# Patient Record
Sex: Female | Born: 1935 | Race: Black or African American | Hispanic: No | Marital: Married | Smoking: Former smoker
Health system: Southern US, Community
[De-identification: ages and names within clinical notes are randomized; demographics above are authoritative.]

## PROBLEM LIST (undated history)

## (undated) DIAGNOSIS — G252 Other specified forms of tremor: Secondary | ICD-10-CM

## (undated) DIAGNOSIS — K5792 Diverticulitis of intestine, part unspecified, without perforation or abscess without bleeding: Secondary | ICD-10-CM

## (undated) DIAGNOSIS — I5032 Chronic diastolic (congestive) heart failure: Secondary | ICD-10-CM

## (undated) DIAGNOSIS — M1990 Unspecified osteoarthritis, unspecified site: Secondary | ICD-10-CM

## (undated) DIAGNOSIS — Z8701 Personal history of pneumonia (recurrent): Secondary | ICD-10-CM

## (undated) DIAGNOSIS — I639 Cerebral infarction, unspecified: Secondary | ICD-10-CM

## (undated) DIAGNOSIS — K219 Gastro-esophageal reflux disease without esophagitis: Secondary | ICD-10-CM

## (undated) DIAGNOSIS — E119 Type 2 diabetes mellitus without complications: Secondary | ICD-10-CM

## (undated) DIAGNOSIS — D638 Anemia in other chronic diseases classified elsewhere: Secondary | ICD-10-CM

## (undated) DIAGNOSIS — R569 Unspecified convulsions: Secondary | ICD-10-CM

## (undated) DIAGNOSIS — M79602 Pain in left arm: Secondary | ICD-10-CM

## (undated) DIAGNOSIS — I10 Essential (primary) hypertension: Secondary | ICD-10-CM

## (undated) DIAGNOSIS — F0390 Unspecified dementia without behavioral disturbance: Secondary | ICD-10-CM

## (undated) DIAGNOSIS — I2510 Atherosclerotic heart disease of native coronary artery without angina pectoris: Secondary | ICD-10-CM

## (undated) DIAGNOSIS — G250 Essential tremor: Secondary | ICD-10-CM

## (undated) HISTORY — PX: TUMOR REMOVAL: SHX12

## (undated) HISTORY — PX: FRACTURE SURGERY: SHX138

## (undated) HISTORY — DX: Atherosclerotic heart disease of native coronary artery without angina pectoris: I25.10

## (undated) HISTORY — PX: APPENDECTOMY: SHX54

## (undated) HISTORY — PX: COLONOSCOPY: SHX174

## (undated) HISTORY — PX: DILATION AND CURETTAGE OF UTERUS: SHX78

## (undated) HISTORY — PX: ABDOMINAL HYSTERECTOMY: SHX81

## (undated) HISTORY — DX: Unspecified dementia, unspecified severity, without behavioral disturbance, psychotic disturbance, mood disturbance, and anxiety: F03.90

## (undated) HISTORY — DX: Essential tremor: G25.0

## (undated) HISTORY — DX: Other specified forms of tremor: G25.2

---

## 1997-10-10 ENCOUNTER — Emergency Department (HOSPITAL_COMMUNITY): Admission: EM | Admit: 1997-10-10 | Discharge: 1997-10-10 | Payer: Self-pay | Admitting: Emergency Medicine

## 1998-09-19 ENCOUNTER — Encounter: Payer: Self-pay | Admitting: Emergency Medicine

## 1998-09-19 ENCOUNTER — Inpatient Hospital Stay (HOSPITAL_COMMUNITY): Admission: EM | Admit: 1998-09-19 | Discharge: 1998-09-24 | Payer: Self-pay | Admitting: Emergency Medicine

## 1998-10-04 ENCOUNTER — Encounter: Payer: Self-pay | Admitting: Emergency Medicine

## 1998-10-04 ENCOUNTER — Emergency Department (HOSPITAL_COMMUNITY): Admission: EM | Admit: 1998-10-04 | Discharge: 1998-10-04 | Payer: Self-pay | Admitting: Emergency Medicine

## 1998-10-05 ENCOUNTER — Encounter: Payer: Self-pay | Admitting: General Surgery

## 1998-10-05 ENCOUNTER — Inpatient Hospital Stay (HOSPITAL_COMMUNITY): Admission: EM | Admit: 1998-10-05 | Discharge: 1998-10-07 | Payer: Self-pay | Admitting: Emergency Medicine

## 1998-10-29 ENCOUNTER — Emergency Department (HOSPITAL_COMMUNITY): Admission: EM | Admit: 1998-10-29 | Discharge: 1998-10-29 | Payer: Self-pay | Admitting: Emergency Medicine

## 1998-11-18 ENCOUNTER — Emergency Department (HOSPITAL_COMMUNITY): Admission: EM | Admit: 1998-11-18 | Discharge: 1998-11-18 | Payer: Self-pay | Admitting: Emergency Medicine

## 1998-11-20 ENCOUNTER — Inpatient Hospital Stay (HOSPITAL_COMMUNITY): Admission: AD | Admit: 1998-11-20 | Discharge: 1998-11-23 | Payer: Self-pay | Admitting: Family Medicine

## 1998-12-01 ENCOUNTER — Emergency Department (HOSPITAL_COMMUNITY): Admission: EM | Admit: 1998-12-01 | Discharge: 1998-12-01 | Payer: Self-pay | Admitting: Emergency Medicine

## 1998-12-06 ENCOUNTER — Observation Stay (HOSPITAL_COMMUNITY): Admission: EM | Admit: 1998-12-06 | Discharge: 1998-12-07 | Payer: Self-pay | Admitting: Emergency Medicine

## 1999-07-12 ENCOUNTER — Emergency Department (HOSPITAL_COMMUNITY): Admission: EM | Admit: 1999-07-12 | Discharge: 1999-07-12 | Payer: Self-pay | Admitting: Emergency Medicine

## 1999-09-23 ENCOUNTER — Ambulatory Visit (HOSPITAL_COMMUNITY): Admission: RE | Admit: 1999-09-23 | Discharge: 1999-09-23 | Payer: Self-pay | Admitting: Family Medicine

## 1999-09-23 ENCOUNTER — Encounter: Payer: Self-pay | Admitting: Family Medicine

## 2001-01-25 ENCOUNTER — Encounter: Payer: Self-pay | Admitting: Emergency Medicine

## 2001-01-26 ENCOUNTER — Inpatient Hospital Stay (HOSPITAL_COMMUNITY): Admission: EM | Admit: 2001-01-26 | Discharge: 2001-02-02 | Payer: Self-pay | Admitting: Emergency Medicine

## 2001-01-27 ENCOUNTER — Encounter: Payer: Self-pay | Admitting: Family Medicine

## 2001-01-30 ENCOUNTER — Encounter: Payer: Self-pay | Admitting: Family Medicine

## 2001-02-22 ENCOUNTER — Ambulatory Visit (HOSPITAL_COMMUNITY): Admission: RE | Admit: 2001-02-22 | Discharge: 2001-02-22 | Payer: Self-pay | Admitting: Cardiology

## 2001-02-23 ENCOUNTER — Encounter: Payer: Self-pay | Admitting: Emergency Medicine

## 2001-02-23 ENCOUNTER — Emergency Department (HOSPITAL_COMMUNITY): Admission: EM | Admit: 2001-02-23 | Discharge: 2001-02-23 | Payer: Self-pay | Admitting: Emergency Medicine

## 2001-04-16 ENCOUNTER — Inpatient Hospital Stay (HOSPITAL_COMMUNITY): Admission: EM | Admit: 2001-04-16 | Discharge: 2001-04-20 | Payer: Self-pay

## 2001-04-16 ENCOUNTER — Encounter: Payer: Self-pay | Admitting: Family Medicine

## 2001-05-07 ENCOUNTER — Emergency Department (HOSPITAL_COMMUNITY): Admission: EM | Admit: 2001-05-07 | Discharge: 2001-05-08 | Payer: Self-pay | Admitting: Emergency Medicine

## 2001-05-08 ENCOUNTER — Encounter: Payer: Self-pay | Admitting: Emergency Medicine

## 2001-06-15 ENCOUNTER — Inpatient Hospital Stay (HOSPITAL_COMMUNITY): Admission: EM | Admit: 2001-06-15 | Discharge: 2001-06-22 | Payer: Self-pay

## 2001-08-13 ENCOUNTER — Emergency Department (HOSPITAL_COMMUNITY): Admission: EM | Admit: 2001-08-13 | Discharge: 2001-08-13 | Payer: Self-pay | Admitting: Emergency Medicine

## 2002-08-31 ENCOUNTER — Emergency Department (HOSPITAL_COMMUNITY): Admission: EM | Admit: 2002-08-31 | Discharge: 2002-08-31 | Payer: Self-pay | Admitting: Emergency Medicine

## 2002-08-31 ENCOUNTER — Encounter: Payer: Self-pay | Admitting: Emergency Medicine

## 2003-10-17 ENCOUNTER — Ambulatory Visit (HOSPITAL_COMMUNITY): Admission: RE | Admit: 2003-10-17 | Discharge: 2003-10-17 | Payer: Self-pay | Admitting: Family Medicine

## 2004-10-29 ENCOUNTER — Ambulatory Visit: Payer: Self-pay | Admitting: Physical Medicine & Rehabilitation

## 2004-10-29 ENCOUNTER — Inpatient Hospital Stay (HOSPITAL_COMMUNITY): Admission: EM | Admit: 2004-10-29 | Discharge: 2004-11-04 | Payer: Self-pay | Admitting: Emergency Medicine

## 2004-11-04 ENCOUNTER — Inpatient Hospital Stay
Admission: RE | Admit: 2004-11-04 | Discharge: 2004-11-12 | Payer: Self-pay | Admitting: Physical Medicine & Rehabilitation

## 2004-12-29 ENCOUNTER — Emergency Department (HOSPITAL_COMMUNITY): Admission: EM | Admit: 2004-12-29 | Discharge: 2004-12-29 | Payer: Self-pay | Admitting: Emergency Medicine

## 2005-05-24 ENCOUNTER — Emergency Department (HOSPITAL_COMMUNITY): Admission: EM | Admit: 2005-05-24 | Discharge: 2005-05-25 | Payer: Self-pay | Admitting: Emergency Medicine

## 2005-06-03 ENCOUNTER — Emergency Department (HOSPITAL_COMMUNITY): Admission: EM | Admit: 2005-06-03 | Discharge: 2005-06-03 | Payer: Self-pay | Admitting: Emergency Medicine

## 2005-09-24 ENCOUNTER — Encounter: Payer: Self-pay | Admitting: General Surgery

## 2005-12-11 ENCOUNTER — Emergency Department (HOSPITAL_COMMUNITY): Admission: EM | Admit: 2005-12-11 | Discharge: 2005-12-12 | Payer: Self-pay | Admitting: Emergency Medicine

## 2006-02-06 ENCOUNTER — Emergency Department (HOSPITAL_COMMUNITY): Admission: EM | Admit: 2006-02-06 | Discharge: 2006-02-06 | Payer: Self-pay | Admitting: Emergency Medicine

## 2006-02-08 ENCOUNTER — Emergency Department (HOSPITAL_COMMUNITY): Admission: EM | Admit: 2006-02-08 | Discharge: 2006-02-09 | Payer: Self-pay | Admitting: Emergency Medicine

## 2006-03-10 ENCOUNTER — Ambulatory Visit (HOSPITAL_COMMUNITY): Admission: RE | Admit: 2006-03-10 | Discharge: 2006-03-10 | Payer: Self-pay | Admitting: Family Medicine

## 2006-08-09 ENCOUNTER — Inpatient Hospital Stay (HOSPITAL_COMMUNITY): Admission: EM | Admit: 2006-08-09 | Discharge: 2006-08-14 | Payer: Self-pay | Admitting: Emergency Medicine

## 2006-12-28 ENCOUNTER — Inpatient Hospital Stay (HOSPITAL_COMMUNITY): Admission: EM | Admit: 2006-12-28 | Discharge: 2006-12-30 | Payer: Self-pay | Admitting: Emergency Medicine

## 2007-04-05 ENCOUNTER — Emergency Department (HOSPITAL_COMMUNITY): Admission: EM | Admit: 2007-04-05 | Discharge: 2007-04-05 | Payer: Self-pay | Admitting: Emergency Medicine

## 2007-08-01 ENCOUNTER — Emergency Department (HOSPITAL_COMMUNITY): Admission: EM | Admit: 2007-08-01 | Discharge: 2007-08-02 | Payer: Self-pay | Admitting: Emergency Medicine

## 2007-09-27 ENCOUNTER — Emergency Department (HOSPITAL_COMMUNITY): Admission: EM | Admit: 2007-09-27 | Discharge: 2007-09-27 | Payer: Self-pay | Admitting: Emergency Medicine

## 2009-04-24 DIAGNOSIS — M109 Gout, unspecified: Secondary | ICD-10-CM

## 2009-04-24 DIAGNOSIS — M1990 Unspecified osteoarthritis, unspecified site: Secondary | ICD-10-CM | POA: Insufficient documentation

## 2009-04-24 DIAGNOSIS — F068 Other specified mental disorders due to known physiological condition: Secondary | ICD-10-CM

## 2009-04-24 DIAGNOSIS — I10 Essential (primary) hypertension: Secondary | ICD-10-CM

## 2009-04-24 DIAGNOSIS — K219 Gastro-esophageal reflux disease without esophagitis: Secondary | ICD-10-CM | POA: Insufficient documentation

## 2009-04-24 DIAGNOSIS — G252 Other specified forms of tremor: Secondary | ICD-10-CM

## 2009-04-24 DIAGNOSIS — E119 Type 2 diabetes mellitus without complications: Secondary | ICD-10-CM | POA: Insufficient documentation

## 2009-04-24 DIAGNOSIS — G250 Essential tremor: Secondary | ICD-10-CM

## 2009-04-24 DIAGNOSIS — I509 Heart failure, unspecified: Secondary | ICD-10-CM | POA: Insufficient documentation

## 2009-04-25 ENCOUNTER — Ambulatory Visit: Payer: Self-pay | Admitting: Cardiology

## 2009-04-25 DIAGNOSIS — I739 Peripheral vascular disease, unspecified: Secondary | ICD-10-CM

## 2009-04-25 DIAGNOSIS — R079 Chest pain, unspecified: Secondary | ICD-10-CM

## 2009-04-25 DIAGNOSIS — I2510 Atherosclerotic heart disease of native coronary artery without angina pectoris: Secondary | ICD-10-CM | POA: Insufficient documentation

## 2009-04-25 DIAGNOSIS — R0602 Shortness of breath: Secondary | ICD-10-CM

## 2009-04-25 LAB — CONVERTED CEMR LAB
CO2: 24 meq/L (ref 19–32)
Chloride: 110 meq/L (ref 96–112)
Creatinine, Ser: 1.6 mg/dL — ABNORMAL HIGH (ref 0.4–1.2)
Glucose, Bld: 143 mg/dL — ABNORMAL HIGH (ref 70–99)
Pro B Natriuretic peptide (BNP): 192 pg/mL — ABNORMAL HIGH (ref 0.0–100.0)
Sodium: 141 meq/L (ref 135–145)

## 2009-05-03 ENCOUNTER — Telehealth (INDEPENDENT_AMBULATORY_CARE_PROVIDER_SITE_OTHER): Payer: Self-pay

## 2009-05-07 ENCOUNTER — Ambulatory Visit (HOSPITAL_COMMUNITY): Admission: RE | Admit: 2009-05-07 | Discharge: 2009-05-07 | Payer: Self-pay | Admitting: Cardiology

## 2009-05-07 ENCOUNTER — Ambulatory Visit: Payer: Self-pay

## 2009-05-07 ENCOUNTER — Ambulatory Visit: Payer: Self-pay | Admitting: Cardiology

## 2009-05-07 ENCOUNTER — Encounter (HOSPITAL_COMMUNITY): Admission: RE | Admit: 2009-05-07 | Discharge: 2009-05-23 | Payer: Self-pay | Admitting: Cardiology

## 2009-05-07 ENCOUNTER — Encounter: Payer: Self-pay | Admitting: Cardiology

## 2009-05-08 ENCOUNTER — Ambulatory Visit: Payer: Self-pay | Admitting: Cardiology

## 2009-05-11 LAB — CONVERTED CEMR LAB
Alkaline Phosphatase: 89 units/L (ref 39–117)
CO2: 27 meq/L (ref 19–32)
Chloride: 102 meq/L (ref 96–112)
Cholesterol: 243 mg/dL — ABNORMAL HIGH (ref 0–200)
Direct LDL: 114 mg/dL
GFR calc non Af Amer: 43.74 mL/min (ref >60)
Glucose, Bld: 133 mg/dL — ABNORMAL HIGH (ref 70–99)
Total Bilirubin: 1.2 mg/dL (ref 0.3–1.2)
Total Protein: 7 g/dL (ref 6.0–8.3)
Triglycerides: 183 mg/dL — ABNORMAL HIGH (ref 0.0–149.0)

## 2009-05-29 ENCOUNTER — Ambulatory Visit: Payer: Self-pay | Admitting: Cardiology

## 2009-05-30 ENCOUNTER — Telehealth: Payer: Self-pay | Admitting: Cardiology

## 2009-09-11 ENCOUNTER — Ambulatory Visit: Payer: Self-pay | Admitting: Cardiology

## 2009-09-17 ENCOUNTER — Ambulatory Visit (HOSPITAL_COMMUNITY): Admission: RE | Admit: 2009-09-17 | Discharge: 2009-09-17 | Payer: Self-pay | Admitting: Family Medicine

## 2009-09-28 ENCOUNTER — Telehealth: Payer: Self-pay | Admitting: Cardiology

## 2009-09-28 ENCOUNTER — Ambulatory Visit: Payer: Self-pay | Admitting: Cardiology

## 2009-10-02 LAB — CONVERTED CEMR LAB: BUN: 28 mg/dL — ABNORMAL HIGH (ref 6–23)

## 2009-11-14 ENCOUNTER — Emergency Department (HOSPITAL_COMMUNITY): Admission: EM | Admit: 2009-11-14 | Discharge: 2009-11-14 | Payer: Self-pay | Admitting: Emergency Medicine

## 2009-12-21 ENCOUNTER — Inpatient Hospital Stay (HOSPITAL_COMMUNITY): Admission: EM | Admit: 2009-12-21 | Discharge: 2009-12-24 | Payer: Self-pay | Admitting: Emergency Medicine

## 2010-01-07 ENCOUNTER — Ambulatory Visit: Payer: Self-pay | Admitting: Cardiology

## 2010-01-14 LAB — CONVERTED CEMR LAB
BUN: 25 mg/dL — ABNORMAL HIGH (ref 6–23)
CO2: 24 meq/L (ref 19–32)
Chloride: 108 meq/L (ref 96–112)
GFR calc non Af Amer: 52.42 mL/min (ref >60)
Glucose, Bld: 102 mg/dL — ABNORMAL HIGH (ref 70–99)
Potassium: 3.9 meq/L (ref 3.5–5.1)

## 2010-03-31 ENCOUNTER — Inpatient Hospital Stay (HOSPITAL_COMMUNITY)
Admission: EM | Admit: 2010-03-31 | Discharge: 2010-04-06 | Payer: Self-pay | Source: Home / Self Care | Admitting: Emergency Medicine

## 2010-04-02 ENCOUNTER — Encounter: Payer: Self-pay | Admitting: Cardiology

## 2010-05-08 ENCOUNTER — Encounter: Payer: Self-pay | Admitting: Cardiology

## 2010-05-08 ENCOUNTER — Ambulatory Visit: Payer: Self-pay | Admitting: Cardiology

## 2010-05-22 ENCOUNTER — Ambulatory Visit: Payer: Self-pay | Admitting: Cardiology

## 2010-06-13 ENCOUNTER — Telehealth (INDEPENDENT_AMBULATORY_CARE_PROVIDER_SITE_OTHER): Payer: Self-pay | Admitting: *Deleted

## 2010-06-25 NOTE — Assessment & Plan Note (Signed)
Summary: per check out/sf   Visit Type:  Follow-up Primary Provider:  Billee Cashing, MD  CC:  no complaints.  History of Present Illness: 77 yo with history of mild CAD and diastolic CHF prevents for followup.  Patient has quite significant exertional shortness of breath that has been stable for a number of years.  She is short of breath after walking about 1/2 a city block slowly.  She is unable to climb steps due to shortness of breath.  She has to stop multiple times when she walks in [place]. She has had significant lower extremity edema for a number of years. She has orthopnea and requires 2 pillows (smothers with 1 pillow).  Symptoms have all been stable.  She was evaluated for a bladder prolapse surgery at [facility].  She was told that her heart was "too weak" for the surgery per her report (though EF by echo and myoview was normal and CHF is diastolic).    She had a Lexiscan myoview showing normal EF and some anterior attenuation with no evidence for ischemia.  Echo showed mild LVH and EF 55-60%.  Arterial dopplers done given leg pain and difficulty feeling pedal pulses showed no evidence for significant PAD.  She has gained about 5 lbs since I last saw her.  BP is quite high today, 180/77 and has been high at home.   Labs (12/10): BNP 192, K 4.4, creatinine 1.5, TG 183, HDL 93, LDL 114, AST 48, ALT 16    Current Medications (verified): 1)  Aspirin 81 Mg Tabs (Aspirin) .... Take One Tablet Once Daily 2)  Actos 15 Mg Tabs (Pioglitazone Hcl) .... Take One Tablet Once Daily 3)  Prevacid 30 Mg Cpdr (Lansoprazole) .... Take One Capsule 4)  Premarin 0.625 Mg/gm Crea (Estrogens, Conjugated) .... Use Twice in The Evening 5)  Colchicine 0.6 Mg Tabs (Colchicine) .... Take One Tablet Two Times A Day 6)  Metformin Hcl 500 Mg Tabs (Metformin Hcl) .Marland Kitchen.. 1 -2 Tabs Once Daily 7)  Clonidine Hcl 0.1 Mg Tabs (Clonidine Hcl) .... At Bedtime 8)  Lyrica 50 Mg Caps (Pregabalin) .... Take One Tablet  Three Times A Day 9)  Aricept 10 Mg Tabs (Donepezil Hcl) .... Take One Tablet At Bedtime 10)  Topiramate 50 Mg Tabs (Topiramate) .... Take One Tablet Once Daily 11)  Furosemide 40 Mg Tabs (Furosemide) .... One Tablet in The Morning and One-Half Tablet in The Evening 12)  Potassium Chloride Crys Cr 20 Meq Cr-Tabs (Potassium Chloride Crys Cr) .... Take One Tablet By Mouth Daily 13)  Simvastatin 20 Mg Tabs (Simvastatin) .... One Tablet Daily 14)  Coreg 6.25 Mg Tabs (Carvedilol) .... One Tablet Twice A Day  Allergies (verified): 1)  ! Morphine 2)  ! Codeine 3)  ! Pcn  Past History:  Past Medical History: Reviewed history from 05/29/2009 and no changes required. 1. Diabetes mellitus type II 2. Very mild dementia 3. gout 4. GERD 5. HTN 6. OA 7. Diastolic CHF: Echo (12/10) with mild LVH, EF 55-60%, no significant valvular abnormality.  8. History of diverticulitis 9. tremor 10. CAD: LHC (9/02) for abnormal myoview showed 60-70% D1 stenosis and luminal irregularities in other vessels.  Lexiscan myoview (12/10): EF 71%, mild anterior attenuation, no ischemia.  11. CKD 12. Arterial dopplers (12/10) without evidence for significant PAD.   Family History: Reviewed history from 04/25/2009 and no changes required. Father with MI at age 49.  4 brothers had MIs in their 80s.   Social History: Reviewed  history from 04/25/2009 and no changes required. Quit smoking 25 yrs ago.  Originally from [place] but lives in [place] now.  Has 5 children.  Married.   Review of Systems       All systems reviewed and negative except as per HPI.   Vital Signs:  Patient profile:   77 year old female Height:      61 inches Weight:      226 pounds BMI:     42.86 Pulse rate:   57 / minute BP sitting:   180 / 77  (left arm) Cuff size:   large  Vitals Entered By: Burnett Kanaris, CNA (September 11, 2009 4:18 PM)  Physical Exam  General:  Well developed, well nourished, in no acute distress. Obese.   Neck:  Neck supple, JVP 10 cm. No masses, thyromegaly or abnormal cervical nodes. Lungs:  Clear bilaterally to auscultation and percussion. Heart:  Non-displaced PMI, chest non-tender; regular rate and rhythm, S1, S2 without murmurs, rubs or gallops. Carotid upstroke normal, no bruit.  Pedals normal pulses. 2+ edema to knees bilaterally.  Abdomen:  Bowel sounds positive; abdomen soft and non-tender without masses, organomegaly, or hernias noted. No hepatosplenomegaly.  Obese.  Extremities:  No clubbing or cyanosis. Neurologic:  Alert and oriented x 3. Psych:  Normal affect.   Impression & Recommendations:  Problem # 1:  CONGESTIVE HEART FAILURE UNSPECIFIED (ICD-428.0) Diastolic CHF.  She is still volume overloaded on exam with NYHA class III symptoms, though improved. I am going to increase her Lasix to 40 mg bid.  She needs to watch salt intake.  BP needs to be controlled. I am also going to have her wear graded compression stockings, thigh high, given her chronic lower extremity edema. BMET and BNP in 2 wks.   Problem # 2:  HYPERTENSION (ICD-401.9) Needs better BP control, especially given diastolic CHF.  I will have her start amlodipine 5 mg daily.  BP check in 2 wks.    Patient Instructions: 1)  Your physician has recommended you make the following change in your medication:  2)  Increase Lasix(furosemide) to 40mg  twice a day 3)  Start Amlodipine 5mg  daily 4)  Your physician has requested that you regularly monitor and record your blood pressure readings at home.  Please use the same machine at the same time of day to check your readings and record them.  I will call you in 2 weeks to get the readings. Anne Lankford,RN  5)  Use support stockings--you have the prescription. 6)  Your physician recommends that you return for lab work in: 2 weeks--BMP/BNP 428.0 401.9  7)  Your physician recommends that you schedule a follow-up appointment in: 4 months with Dr  Shirlee Latch. Prescriptions: AMLODIPINE BESYLATE 5 MG TABS (AMLODIPINE BESYLATE) one tablet daily  #30 x 6   Entered by:   Katina Dung, RN, BSN   Authorized by:   Marca Ancona, MD   Signed by:   Katina Dung, RN, BSN on 09/11/2009   Method used:   Electronically to        [facility] (retail)       [address]       [place], [place]  [address]       Ph: [phone]       Fax: [phone]   RxID:   [id number] FUROSEMIDE 40 MG TABS (FUROSEMIDE) one tablet twice a day  #60 x 6   Entered  by:   Katina Dung, RN, BSN   Authorized by:   Marca Ancona, MD   Signed by:   Katina Dung, RN, BSN on 09/11/2009   Method used:   Electronically to        [facility] (retail)       [address]       [place], [place]  [address]       Ph: [phone]       Fax: [phone]   RxID:   [id number]

## 2010-06-25 NOTE — Progress Notes (Signed)
Summary: Dr.Lentz is MD at [facility]  Phone Note Call from Patient Call back at [facility] Phone [phone]   Caller: Patient Reason for Call: Insurance Question Summary of Call: Pt giving information for [facility]  MD  is Dr. Noland Fordyce [phone] Initial call taken by: Judie Grieve,  May 30, 2009 2:22 PM  Follow-up for Phone Call        [facility] Katina Dung, RN, BSN  May 30, 2009 3:04 PM  talked with pt--office note from 05-29-09 faxed to Dr Noland Fordyce

## 2010-06-25 NOTE — Miscellaneous (Signed)
Summary: Orders Update  Clinical Lists Changes  Orders: Added new Test order of T-Basic Metabolic Panel ([id number]) - Signed Added new Test order of T-BNP  (B Natriuretic Peptide) ([id number]) - Signed 

## 2010-06-25 NOTE — Assessment & Plan Note (Signed)
Summary: per check out/HAPPY BIRTHDAY/SAF   Visit Type:  Follow-up Primary Provider:  Billee Cashing, MD  CC:  no cardaic complaints.  History of Present Illness: 77 yo with history of mild CAD and diastolic CHF prevents for followup.  Patient has quite significant exertional shortness of breath that has been stable for a number of years.  She is short of breath after walking about 1/2 a city block slowly.  She is unable to climb steps due to shortness of breath.  She has had significant lower extremity edema for a number of years. She has orthopnea and requires 2 pillows (smothers with 1 pillow).  Symptoms have all been stable.    She had a Lexiscan myoview showing normal EF and some anterior attenuation with no evidence for ischemia.  Echo showed mild LVH and EF 55-60%.  Arterial dopplers done given leg pain and difficulty feeling pedal pulses showed no evidence for significant PAD.    Since I last saw her, she stopped taking amlodipine 10 mg daily because she thought it was causing diarrhea.  She does think two times a day Lasix has helped her dyspnea and swelling.  She was hospitalized at [facility] in 7/11 for idiopathic acute pancreatitis (not related to gallstones or ETOH).   Finally, she is still thinking about bladder prolapse surgery with Dr. Alwyn Ren.  Weight is down 7 lbs since last appointment  Labs (12/10): BNP 192, K 4.4, creatinine 1.5, TG 183, HDL 93, LDL 114, AST 48, ALT 16 Labs (5/11): K 4.2, creatinine 1.53, BNP 155 Labs (7/11): creatinine 1.2   Current Medications (verified): 1)  Aspirin 81 Mg Tabs (Aspirin) .... Take One Tablet Once Daily 2)  Actos 15 Mg Tabs (Pioglitazone Hcl) .... Take One Tablet Once Daily 3)  Prevacid 30 Mg Cpdr (Lansoprazole) .... Take One Capsule 4)  Premarin 0.625 Mg/gm Crea (Estrogens, Conjugated) .... Use Twice in The Evening 5)  Colchicine 0.6 Mg Tabs (Colchicine) .... Take One Tablet Two Times A Day 6)  Metformin Hcl 500 Mg Tabs (Metformin Hcl)  .Marland Kitchen.. 1 -2 Tabs Once Daily 7)  Clonidine Hcl 0.1 Mg Tabs (Clonidine Hcl) .... At Bedtime 8)  Lyrica 50 Mg Caps (Pregabalin) .... Take One Tablet Three Times A Day 9)  Aricept 10 Mg Tabs (Donepezil Hcl) .... Take One Tablet At Bedtime 10)  Topiramate 50 Mg Tabs (Topiramate) .... Take One Tablet Once Daily 11)  Furosemide 40 Mg Tabs (Furosemide) .... One Tablet Twice A Day 12)  Potassium Chloride Crys Cr 20 Meq Cr-Tabs (Potassium Chloride Crys Cr) .... Take One Tablet By Mouth Daily 13)  Simvastatin 20 Mg Tabs (Simvastatin) .... One Tablet Daily 14)  Coreg 6.25 Mg Tabs (Carvedilol) .... One Tablet Twice A Day 15)  Amlodipine Besylate 5 Mg Tabs (Amlodipine Besylate) .... Pt Does Not Take Causr Diaherra  Allergies (verified): 1)  ! Morphine 2)  ! Codeine 3)  ! Pcn  Past History:  Past Medical History: 1. Diabetes mellitus type II 2. Very mild dementia 3. gout 4. GERD 5. HTN 6. OA 7. Diastolic CHF: Echo (12/10) with mild LVH, EF 55-60%, no significant valvular abnormality.  8. History of diverticulitis 9. tremor 10. CAD: LHC (9/02) for abnormal myoview showed 60-70% D1 stenosis and luminal irregularities in other vessels.  Lexiscan myoview (12/10): EF 71%, mild anterior attenuation, no ischemia.  11. CKD 12. Arterial dopplers (12/10) without evidence for significant PAD.  13. Idiopathic acute pancreatitis in 7/11.  No ETOH or gallstones.   Family  History: Reviewed history from 04/25/2009 and no changes required. Father with MI at age 31.  4 brothers had MIs in their 54s.   Social History: Reviewed history from 04/25/2009 and no changes required. Quit smoking 25 yrs ago.  Originally from [place] but lives in [place] now.  Has 5 children.  Married.   Review of Systems       All systems reviewed and negative except as per HPI.   Vital Signs:  Patient profile:   77 year old female Height:      61 inches Weight:      219 pounds BMI:     41.53 Pulse rate:   67 /  minute BP sitting:   125 / 75  (left arm) Cuff size:   large  Vitals Entered By: Burnett Kanaris, CNA (January 07, 2010 3:59 PM)  Physical Exam  General:  Well developed, well nourished, in no acute distress. Obese.  Neck:  Neck supple, JVP not elevated. No masses, thyromegaly or abnormal cervical nodes. Lungs:  Clear bilaterally to auscultation and percussion. Heart:  Non-displaced PMI, chest non-tender; regular rate and rhythm, S1, S2 without murmurs, rubs or gallops. Carotid upstroke normal, no bruit.  Pedals normal pulses. 1+ edema to knees bilaterally.  Abdomen:  Bowel sounds positive; abdomen soft and non-tender without masses, organomegaly, or hernias noted. No hepatosplenomegaly. Extremities:  No clubbing or cyanosis. Neurologic:  Alert and oriented x 3. Psych:  Normal affect.   Impression & Recommendations:  Problem # 1:  CAD (ICD-414.00) Known CAD with 60-70% D1 stenosis on 2002 cath.  Nonischemic myoview in 12/10.   I will have her continue ASA 81 mg daily.  Started on low dose statin for goal LDL < 70. She is on a beta blocker and a statin.   Problem # 2:  CONGESTIVE HEART FAILURE UNSPECIFIED (ICD-428.0) Diastolic CHF.  Mild volume overload (peripheral edema but neck veins do not seem to be up much).   She needs to watch salt intake.  BP needs to stay controlled. She cannot wear compression stockings.   BMET/BNP today.   Problem # 3:  HYPERTENSION (ICD-401.9) I will have patient restart a lower dose of amlodipine (BP was very high in hospital), 5 mg daily.  We will monitor to see if this causes GI side effects.    Problem # 4:  PREOPERATIVE EVALUATION Patient needs bladder prolapse surgery.  Patient will be higher risk for surgery, given CHF, but she is well-compensated and the risk is not prohibitive.  No chest pain and recent negative myoview.  She should continue beta blocker peri-operatively.   Other Orders: TLB-BMP (Basic Metabolic Panel-BMET) (80048-[id number]) TLB-BNP  (B-Natriuretic Peptide) (83880-BNPR)  Patient Instructions: 1)  Your physician has recommended you make the following change in your medication:  2)  Take Amlodipine 5mg  daily. 3)  Your physician recommends that you have lab today--BMP/BNP. 4)  Your physician recommends that you schedule a follow-up appointment in: 4 months with Dr Shirlee Latch. Prescriptions: AMLODIPINE BESYLATE 5 MG TABS (AMLODIPINE BESYLATE) one daily  #30 x 11   Entered by:   Katina Dung, RN, BSN   Authorized by:   Marca Ancona, MD   Signed by:   Katina Dung, RN, BSN on 01/07/2010   Method used:   Electronically to        [facility] (retail)       [address]       [place]  [place], [place]  [address]       Ph: [phone]       Fax: [phone]   RxID:   [id number]

## 2010-06-25 NOTE — Consult Note (Signed)
Summary: [facility] Consultation Report  [place] Consultation Report   Imported By: Earl Many 04/16/2010 15:12:51  _____________________________________________________________________  External Attachment:    Type:   Image     Comment:   External Document

## 2010-06-25 NOTE — Assessment & Plan Note (Signed)
Summary: per check out/pt wanted to wait til after x-mas/saf   Primary Provider:  Billee Cashing, MD   History of Present Illness: 77 yo with history of mild CAD and diastolic CHF prevents for evaluation prior to bladder prolapse surgery.  She does not have the name of the surgeon.  Patient has quite significant exertional shortness of breath that has been present for a number of years.  She is short of breath after walking about 1/2 a city block slowly.  She is unable to climb steps due to shortness of breath.  She has to stop multiple times when she walks in [place]. She has had significant lower extremity edema for a number of years. She has orthopnea and requires 2 pillows (smothers with 1 pillow).  No PND.  Patient reports left-sided chest pressure, usually at night.  This is quite infrequent.  Finally, she gets pain in her legs, L>R right walking.  The pain tends to be in the thighs.    Since last appointment, she had a Lexiscan myoview showing normal EF and some anterior attenuation with no evidence for ischemia.  Echo showed mild LVH and EF 55-60%.  Arterial dopplers done given leg pain and difficulty feeling pedal pulses showed no evidence for significant PAD.  She has been taking Lasix but has only lost a couple of pounds.  However, she did have some dietary indiscretion about Christmas.  Breathing is a bit better but she is still significantly short of breath.    Labs (12/10): BNP 192, K 4.4, creatinine 1.5, TG 183, HDL 93, LDL 114, AST 48, ALT 16    Current Medications (verified): 1)  Aspirin 81 Mg Tabs (Aspirin) .... Take One Tablet Once Daily 2)  Actos 15 Mg Tabs (Pioglitazone Hcl) .... Take One Tablet Once Daily 3)  Prevacid 30 Mg Cpdr (Lansoprazole) .... Take One Capsule 4)  Premarin 0.625 Mg/gm Crea (Estrogens, Conjugated) .... Use Twice in The Evening 5)  Colchicine 0.6 Mg Tabs (Colchicine) .... Take One Tablet Two Times A Day 6)  Metformin Hcl 500 Mg Tabs (Metformin Hcl)  .Marland Kitchen.. 1 -2 Tabs Once Daily 7)  Clonidine Hcl 0.1 Mg Tabs (Clonidine Hcl) .... At Bedtime 8)  Lyrica 50 Mg Caps (Pregabalin) .... Take One Tablet Three Times A Day 9)  Aricept 10 Mg Tabs (Donepezil Hcl) .... Take One Tablet At Bedtime 10)  Topiramate 50 Mg Tabs (Topiramate) .... Take One Tablet Once Daily 11)  Furosemide 40 Mg Tabs (Furosemide) .... One Tablet in The Morning and One-Half Tablet in The Evening 12)  Potassium Chloride Crys Cr 20 Meq Cr-Tabs (Potassium Chloride Crys Cr) .... Take One Tablet By Mouth Daily 13)  Simvastatin 20 Mg Tabs (Simvastatin) .... One Tablet Daily 14)  Coreg 6.25 Mg Tabs (Carvedilol) .... One Tablet Twice A Day  Allergies: 1)  ! Morphine 2)  ! Codeine 3)  ! Pcn  Past History:  Past Medical History: 1. Diabetes mellitus type II 2. Very mild dementia 3. gout 4. GERD 5. HTN 6. OA 7. Diastolic CHF: Echo (12/10) with mild LVH, EF 55-60%, no significant valvular abnormality.  8. History of diverticulitis 9. tremor 10. CAD: LHC (9/02) for abnormal myoview showed 60-70% D1 stenosis and luminal irregularities in other vessels.  Lexiscan myoview (12/10): EF 71%, mild anterior attenuation, no ischemia.  11. CKD 12. Arterial dopplers (12/10) without evidence for significant PAD.   Family History: Reviewed history from 04/25/2009 and no changes required. Father with MI at age 52.  4 brothers had MIs in their 74s.   Social History: Reviewed history from 04/25/2009 and no changes required. Quit smoking 25 yrs ago.  Originally from [place] but lives in [place] now.  Has 5 children.  Married.   Review of Systems       All systems reviewed and negative except as per HPI.   Vital Signs:  Patient profile:   77 year old female Height:      61 inches Weight:      221 pounds BMI:     41.91 Pulse rate:   66 / minute BP sitting:   150 / 68  (left arm) Cuff size:   large  Vitals Entered By: Oswald Hillock (May 29, 2009 4:30 PM)  Physical  Exam  General:  Well developed, well nourished, in no acute distress. Obese.  Neck:  Neck supple, JVP 8-9 cm. No masses, thyromegaly or abnormal cervical nodes. Lungs:  Clear bilaterally to auscultation and percussion. Heart:  Non-displaced PMI, chest non-tender; regular rate and rhythm, S1, S2 without murmurs, rubs or gallops. Carotid upstroke normal, no bruit.  Pedals normal pulses. 2+ edema to knees bilaterally.  Abdomen:  Bowel sounds positive; abdomen soft and non-tender without masses, organomegaly, or hernias noted. No hepatosplenomegaly.  Obese.  Extremities:  No clubbing or cyanosis. Neurologic:  Alert and oriented x 3. Psych:  Normal affect.   Impression & Recommendations:  Problem # 1:  CAD (ICD-414.00) Known CAD with 60-70% D1 stenosis on 2002 cath.  Nonischemic myoview in 12/10.   I will have her continue ASA 81 mg daily.  Started on low dose statin for goal LDL < 70.  I will put her on a beta blocker prior to surgery (Coreg 6.25 mg two times a day).   Problem # 2:  CONGESTIVE HEART FAILURE UNSPECIFIED (ICD-428.0) Diastolic CHF.  She is still quite volume overloaded with NYHA class III symptoms, though improved.  I will have her increase Lasix to 40 mg qam and 20 mg qpm.  She needs to watch salt intake.  BP needs to be controlled.   Problem # 3:  HYPERTENSION (ICD-401.9) BP is elevated today.  I am adding Coreg as mentioned above. BP check in 2 wks.  Gently add ACEI if still elevated.   Problem # 4:  PREOPERATIVE EVALUATION Patient needs a bladder prolapse operation.  Patient had a nonischemic myoview.  She is being started on a beta blocker, which should be continued.  She is still volume overloaded (which has been chronic).  I am increasing her Lasix.  Will need to be careful with IV fluid peri-operatively.   I do not think she needs any further cardiac evaluation prior to surgery.   Patient Instructions: 1)  Your physician has recommended you make the following change in  your medication: 2)  Increase Lasix(furosemide) to 40mg  in the morning and 20mg  in the evening 3)  Start Coreg(carvedilol) 6.25mg  twice a day 4)  Try Coricidin HB for your nasal congestion--you can buy this without a prescription 5)  Take and record your blood pressure and pulse--I will call you in 2 weeks to get the results Luana Shu [phone]  6)  Your physician recommends that you schedule a follow-up appointment in: 2 months with Dr. Marca Ancona  Prescriptions: COREG 6.25 MG TABS (CARVEDILOL) one tablet twice a day  #60 x 3   Entered by:   Katina Dung, RN, BSN   Authorized by:   Marca Ancona, MD   Signed  by:   Katina Dung, RN, BSN on 05/29/2009   Method used:   Electronically to        [facility] (retail)       [address]       [place], [place]  [address]       Ph: [phone]       Fax: [phone]   RxID:   [id number] FUROSEMIDE 40 MG TABS (FUROSEMIDE) one tablet in the morning and one-half tablet in the evening  #50 x 3   Entered by:   Katina Dung, RN, BSN   Authorized by:   Marca Ancona, MD   Signed by:   Katina Dung, RN, BSN on 05/29/2009   Method used:   Electronically to        [facility] (retail)       [address]       [place], [place]  [address]       Ph: [phone]       Fax: [phone]   RxID:   [id number]

## 2010-06-25 NOTE — Progress Notes (Signed)
Summary: B/P readings-BMP/BNP 09-28-09--  Phone Note Outgoing Call   Call placed by: Katina Dung, RN, BSN,  Sep 28, 2009 8:08 AM Call placed to: Patient Summary of Call: B/P follow-up  Follow-up for Phone Call        check on B/P Lasix increased /Amlodipine addded 09-11-09---BMP/BNP scheduled for 09-28-09--call to get B/P readings Luana Shu      Appended Document: B/P readings-BMP/BNP 09-28-09-- talked with pt--recent B/P readings have been 130-140/40 --granddaughter taking B/P with automatic wrist machine--pt without complaints--reviewed with Dr Era Skeen new recommendations

## 2010-06-27 NOTE — Progress Notes (Signed)
  All Cardiac Faxed to [facility] @ [phone] [facility]  June 13, 2010 8:48 AM

## 2010-06-27 NOTE — Assessment & Plan Note (Signed)
Summary: per check out/sf   Visit Type:  Follow-up Primary Provider:  Billee Cashing, MD   History of Present Illness: 77 yo with history of mild CAD and diastolic CHF prevents for followup.  Patient has quite significant exertional shortness of breath that has been stable for a number of years.  She is short of breath after walking about 1/2 a city block slowly.  She is unable to climb steps due to shortness of breath.  She has had significant lower extremity edema for a number of years. She has orthopnea and requires 2 pillows (smothers with 1 pillow).  Symptoms have all been stable.  Weight is down 2 lbs since last appointment. BP is elevated to 156/72 today. She is keeping 3 of her great-grandchildren, which is a source of stress.   She had a Lexiscan myoview showing normal EF and some anterior attenuation with no evidence for ischemia.  Echo showed mild LVH and EF 55-60%.  Arterial dopplers done given leg pain and difficulty feeling pedal pulses showed no evidence for significant PAD.    She was hospitalized in 11/11 with abdominal pain.  At that time, she was diagnosed with gastroparesis and biliary dyskinesia.  At that time, it was decided to manage the biliary issues conservatively without cholecystectomy.   Labs (12/10): BNP 192, K 4.4, creatinine 1.5, TG 183, HDL 93, LDL 114, AST 48, ALT 16 Labs (5/11): K 4.2, creatinine 1.53, BNP 155 Labs (7/11): creatinine 1.2 Labs (8/11): BNP 150, K 3.9, creatinine 1.3  ECG: NSR, 1st degree AV block   Current Medications (verified): 1)  Aspirin 81 Mg Tabs (Aspirin) .... Take One Tablet Once Daily 2)  Actos 15 Mg Tabs (Pioglitazone Hcl) .... Take One Tablet Once Daily 3)  Prevacid 30 Mg Cpdr (Lansoprazole) .... Take One Capsule 4)  Premarin 0.625 Mg/gm Crea (Estrogens, Conjugated) .... Use Twice in The Evening 5)  Colchicine 0.6 Mg Tabs (Colchicine) .... As Needed 6)  Metformin Hcl 500 Mg Tabs (Metformin Hcl) .Marland Kitchen.. 1 -2 Tabs Once Daily 7)   Clonidine Hcl 0.1 Mg Tabs (Clonidine Hcl) .... At Bedtime 8)  Lyrica 50 Mg Caps (Pregabalin) .... Take One Tablet Three Times A Day 9)  Aricept 10 Mg Tabs (Donepezil Hcl) .... Take One Tablet At Bedtime 10)  Topiramate 50 Mg Tabs (Topiramate) .... Take One Tablet Once Daily 11)  Furosemide 40 Mg Tabs (Furosemide) .... One Tablet Twice A Day 12)  Potassium Chloride Crys Cr 20 Meq Cr-Tabs (Potassium Chloride Crys Cr) .... Take One Tablet By Mouth Twice A Day 13)  Simvastatin 20 Mg Tabs (Simvastatin) .... One Tablet Daily 14)  Coreg 6.25 Mg Tabs (Carvedilol) .... One Tablet Twice A Day 15)  Amlodipine Besylate 5 Mg Tabs (Amlodipine Besylate) .... One Daily  Allergies (verified): 1)  ! Morphine 2)  ! Codeine 3)  ! Pcn  Past History:  Past Medical History: 1. Diabetes mellitus type II 2. Very mild dementia 3. gout 4. GERD 5. HTN 6. OA 7. Diastolic CHF: Echo (12/10) with mild LVH, EF 55-60%, no significant valvular abnormality.  8. History of diverticulitis 9. tremor 10. CAD: [facility] (9/02) for abnormal myoview showed 60-70% D1 stenosis and luminal irregularities in other vessels.  Lexiscan myoview (12/10): EF 71%, mild anterior attenuation, no ischemia.  11. CKD 12. Arterial dopplers (12/10) without evidence for significant PAD.  13. Idiopathic acute pancreatitis in 7/11.  No ETOH or gallstones.  14. Gastroparesis 15. Biliary dyskinesia  Family History: Reviewed history from 04/25/2009  and no changes required. Father with MI at age 42.  4 brothers had MIs in their 60s.   Social History: Reviewed history from 04/25/2009 and no changes required. Quit smoking 25 yrs ago.  Originally from [place] but lives in [place] now.  Has 5 children.  Married.   Review of Systems       All systems reviewed and negative except as per HPI.   Vital Signs:  Patient profile:   77 year old female Height:      61 inches Weight:      217 pounds BMI:     41.15 Pulse rate:   64 / minute BP  sitting:   156 / 72  (right arm) Cuff size:   large  Vitals Entered By: Hardin Negus, RMA (May 08, 2010 3:44 PM)  Physical Exam  General:  Well developed, well nourished, in no acute distress. Obese.  Neck:  Neck supple, JVP not elevated. No masses, thyromegaly or abnormal cervical nodes. Lungs:  Clear bilaterally to auscultation and percussion. Heart:  Non-displaced PMI, chest non-tender; regular rate and rhythm, S1, S2 without murmurs, rubs or gallops. Carotid upstroke normal, no bruit.  Pedals normal pulses. 1+ edema to knees bilaterally.  Abdomen:  Bowel sounds positive; abdomen soft and non-tender without masses, organomegaly, or hernias noted. No hepatosplenomegaly. Extremities:  No clubbing or cyanosis. Neurologic:  Alert and oriented x 3. Psych:  Normal affect.   Impression & Recommendations:  Problem # 1:  CAD (ICD-414.00) Known CAD with 60-70% D1 stenosis on 2002 cath.  Nonischemic myoview in 12/10.   I will have her continue ASA 81 mg daily.  On low dose statin for goal LDL < 70.  I will have her get fasting lipids/LFTs.  She is on a beta blocker.   Problem # 2:  HYPERTENSION (ICD-401.9) BP still running high.  She will increase amlodipine to 10 mg daily.  She will give [place] a call if her lower extremity edema gets worse with this.   Problem # 3:  CONGESTIVE HEART FAILURE UNSPECIFIED (ICD-428.0) Diastolic CHF.  No JVD but has chronic peripheral edema.  Will continue current two times a day Lasix.    Followup in 6 months.   Other Orders: EKG w/ Interpretation (93000)  Patient Instructions: 1)  Your physician has recommended you make the following change in your medication:  2)  Increase Amlodipine to 10mg  daily--you can take two 5mg  tablets daily at the same time. 3)  Your physician recommends that you return for a FASTING lipid profile/liver profile 401.9  428.32 4)  Your physician wants you to follow-up in: 6 months with Dr Shirlee Latch.Joycie Peek 2012)  You will receive a  reminder letter in the mail two months in advance. If you don't receive a letter, please call our office to schedule the follow-up appointment. Prescriptions: AMLODIPINE BESYLATE 10 MG TABS (AMLODIPINE BESYLATE) one daily  #30 x 11   Entered by:   Katina Dung, RN, BSN   Authorized by:   Marca Ancona, MD   Signed by:   Katina Dung, RN, BSN on 05/08/2010   Method used:   Electronically to        [facility] (retail)       [address]       [place], [place]  [address]       Ph: [phone]       Fax: [phone]   RxID:   [id number]

## 2010-07-18 ENCOUNTER — Emergency Department (HOSPITAL_COMMUNITY): Payer: Medicare Other

## 2010-07-18 ENCOUNTER — Emergency Department (HOSPITAL_COMMUNITY)
Admission: EM | Admit: 2010-07-18 | Discharge: 2010-07-18 | Disposition: A | Discharge status: Home / Self Care | Payer: Medicare Other | Attending: Emergency Medicine | Admitting: Emergency Medicine

## 2010-07-18 DIAGNOSIS — R109 Unspecified abdominal pain: Secondary | ICD-10-CM | POA: Insufficient documentation

## 2010-07-18 DIAGNOSIS — I10 Essential (primary) hypertension: Secondary | ICD-10-CM | POA: Insufficient documentation

## 2010-07-18 DIAGNOSIS — M79609 Pain in unspecified limb: Secondary | ICD-10-CM | POA: Insufficient documentation

## 2010-07-18 DIAGNOSIS — K219 Gastro-esophageal reflux disease without esophagitis: Secondary | ICD-10-CM | POA: Insufficient documentation

## 2010-07-18 DIAGNOSIS — I509 Heart failure, unspecified: Secondary | ICD-10-CM | POA: Insufficient documentation

## 2010-07-18 DIAGNOSIS — Z79899 Other long term (current) drug therapy: Secondary | ICD-10-CM | POA: Insufficient documentation

## 2010-07-18 LAB — URINALYSIS, ROUTINE W REFLEX MICROSCOPIC
Ketones, ur: NEGATIVE mg/dL
Nitrite: NEGATIVE
Protein, ur: NEGATIVE mg/dL
Urine Glucose, Fasting: NEGATIVE mg/dL
Urobilinogen, UA: 0.2 mg/dL (ref 0.0–1.0)

## 2010-08-07 LAB — GLUCOSE, CAPILLARY
Glucose-Capillary: 114 mg/dL — ABNORMAL HIGH (ref 70–99)
Glucose-Capillary: 125 mg/dL — ABNORMAL HIGH (ref 70–99)
Glucose-Capillary: 125 mg/dL — ABNORMAL HIGH (ref 70–99)
Glucose-Capillary: 131 mg/dL — ABNORMAL HIGH (ref 70–99)
Glucose-Capillary: 132 mg/dL — ABNORMAL HIGH (ref 70–99)
Glucose-Capillary: 134 mg/dL — ABNORMAL HIGH (ref 70–99)
Glucose-Capillary: 140 mg/dL — ABNORMAL HIGH (ref 70–99)
Glucose-Capillary: 147 mg/dL — ABNORMAL HIGH (ref 70–99)
Glucose-Capillary: 153 mg/dL — ABNORMAL HIGH (ref 70–99)
Glucose-Capillary: 164 mg/dL — ABNORMAL HIGH (ref 70–99)
Glucose-Capillary: 181 mg/dL — ABNORMAL HIGH (ref 70–99)
Glucose-Capillary: 233 mg/dL — ABNORMAL HIGH (ref 70–99)

## 2010-08-07 LAB — CBC
HCT: 39 % (ref 36.0–46.0)
Hemoglobin: 11.6 g/dL — ABNORMAL LOW (ref 12.0–15.0)
Hemoglobin: 12.8 g/dL (ref 12.0–15.0)
MCH: 32.5 pg (ref 26.0–34.0)
MCH: 32.5 pg (ref 26.0–34.0)
MCHC: 32.8 g/dL (ref 30.0–36.0)
MCHC: 32.9 g/dL (ref 30.0–36.0)
MCV: 99 fL (ref 78.0–100.0)
RBC: 3.94 MIL/uL (ref 3.87–5.11)
RDW: 15.1 % (ref 11.5–15.5)

## 2010-08-07 LAB — URINALYSIS, ROUTINE W REFLEX MICROSCOPIC
Glucose, UA: NEGATIVE mg/dL
Ketones, ur: NEGATIVE mg/dL
Urobilinogen, UA: 1 mg/dL (ref 0.0–1.0)
pH: 5 (ref 5.0–8.0)

## 2010-08-07 LAB — POCT CARDIAC MARKERS
CKMB, poc: <1 ng/mL — ABNORMAL LOW (ref 1.0–8.0)
Myoglobin, poc: 46.4 ng/mL (ref 12–200)
Myoglobin, poc: 50.8 ng/mL (ref 12–200)

## 2010-08-07 LAB — URINE CULTURE
Colony Count: >=100000
Culture  Setup Time: 201111070856

## 2010-08-07 LAB — TROPONIN I: Troponin I: 0.03 ng/mL (ref 0.00–0.06)

## 2010-08-07 LAB — LIPID PANEL
Cholesterol: 218 mg/dL — ABNORMAL HIGH (ref 0–200)
LDL Cholesterol: 74 mg/dL (ref 0–99)

## 2010-08-07 LAB — COMPREHENSIVE METABOLIC PANEL
AST: 31 U/L (ref 0–37)
Albumin: 2.9 g/dL — ABNORMAL LOW (ref 3.5–5.2)
Alkaline Phosphatase: 104 U/L (ref 39–117)
BUN: 15 mg/dL (ref 6–23)
BUN: 8 mg/dL (ref 6–23)
CO2: 24 mEq/L (ref 19–32)
Chloride: 106 mEq/L (ref 96–112)
Creatinine, Ser: 1.22 mg/dL — ABNORMAL HIGH (ref 0.4–1.2)
GFR calc non Af Amer: 43 mL/min — ABNORMAL LOW (ref >60)
Glucose, Bld: 139 mg/dL — ABNORMAL HIGH (ref 70–99)
Potassium: 4.3 mEq/L (ref 3.5–5.1)
Total Bilirubin: 0.4 mg/dL (ref 0.3–1.2)
Total Protein: 6.8 g/dL (ref 6.0–8.3)

## 2010-08-07 LAB — BASIC METABOLIC PANEL
BUN: 6 mg/dL (ref 6–23)
CO2: 23 mEq/L (ref 19–32)
Chloride: 108 mEq/L (ref 96–112)
Creatinine, Ser: 1.24 mg/dL — ABNORMAL HIGH (ref 0.4–1.2)

## 2010-08-07 LAB — CARDIAC PANEL(CRET KIN+CKTOT+MB+TROPI)
Relative Index: 1.8 (ref 0.0–2.5)
Total CK: 100 U/L (ref 7–177)
Troponin I: 0.02 ng/mL (ref 0.00–0.06)
Troponin I: 0.03 ng/mL (ref 0.00–0.06)

## 2010-08-07 LAB — DIFFERENTIAL
Basophils Absolute: 0 10*3/uL (ref 0.0–0.1)
Basophils Relative: 1 % (ref 0–1)
Eosinophils Absolute: 0.4 10*3/uL (ref 0.0–0.7)
Monocytes Relative: 7 % (ref 3–12)
Neutrophils Relative %: 52 % (ref 43–77)

## 2010-08-07 LAB — POCT I-STAT, CHEM 8
BUN: 34 mg/dL — ABNORMAL HIGH (ref 6–23)
Creatinine, Ser: 2.2 mg/dL — ABNORMAL HIGH (ref 0.4–1.2)
Potassium: 3.8 mEq/L (ref 3.5–5.1)
Sodium: 140 mEq/L (ref 135–145)
TCO2: 27 mmol/L (ref 0–100)

## 2010-08-07 LAB — HEMOGLOBIN A1C: Mean Plasma Glucose: 154 mg/dL — ABNORMAL HIGH (ref <117)

## 2010-08-07 LAB — URINE MICROSCOPIC-ADD ON

## 2010-08-07 LAB — AMYLASE: Amylase: 34 U/L (ref 0–105)

## 2010-08-07 LAB — PROTIME-INR: INR: 1.01 (ref 0.00–1.49)

## 2010-08-09 LAB — GLUCOSE, CAPILLARY
Glucose-Capillary: 137 mg/dL — ABNORMAL HIGH (ref 70–99)
Glucose-Capillary: 158 mg/dL — ABNORMAL HIGH (ref 70–99)

## 2010-08-10 LAB — GLUCOSE, CAPILLARY
Glucose-Capillary: 101 mg/dL — ABNORMAL HIGH (ref 70–99)
Glucose-Capillary: 111 mg/dL — ABNORMAL HIGH (ref 70–99)
Glucose-Capillary: 113 mg/dL — ABNORMAL HIGH (ref 70–99)
Glucose-Capillary: 113 mg/dL — ABNORMAL HIGH (ref 70–99)
Glucose-Capillary: 114 mg/dL — ABNORMAL HIGH (ref 70–99)
Glucose-Capillary: 117 mg/dL — ABNORMAL HIGH (ref 70–99)
Glucose-Capillary: 122 mg/dL — ABNORMAL HIGH (ref 70–99)
Glucose-Capillary: 123 mg/dL — ABNORMAL HIGH (ref 70–99)
Glucose-Capillary: 134 mg/dL — ABNORMAL HIGH (ref 70–99)
Glucose-Capillary: 142 mg/dL — ABNORMAL HIGH (ref 70–99)
Glucose-Capillary: 147 mg/dL — ABNORMAL HIGH (ref 70–99)
Glucose-Capillary: 147 mg/dL — ABNORMAL HIGH (ref 70–99)
Glucose-Capillary: 147 mg/dL — ABNORMAL HIGH (ref 70–99)
Glucose-Capillary: 159 mg/dL — ABNORMAL HIGH (ref 70–99)
Glucose-Capillary: 159 mg/dL — ABNORMAL HIGH (ref 70–99)
Glucose-Capillary: 182 mg/dL — ABNORMAL HIGH (ref 70–99)
Glucose-Capillary: 189 mg/dL — ABNORMAL HIGH (ref 70–99)

## 2010-08-10 LAB — BASIC METABOLIC PANEL
Calcium: 8.7 mg/dL (ref 8.4–10.5)
Chloride: 110 mEq/L (ref 96–112)
Creatinine, Ser: 1.2 mg/dL (ref 0.4–1.2)
GFR calc Af Amer: 53 mL/min — ABNORMAL LOW (ref >60)
GFR calc non Af Amer: 44 mL/min — ABNORMAL LOW (ref >60)

## 2010-08-10 LAB — COMPREHENSIVE METABOLIC PANEL
ALT: 21 U/L (ref 0–35)
ALT: 23 U/L (ref 0–35)
AST: 34 U/L (ref 0–37)
Albumin: 2.9 g/dL — ABNORMAL LOW (ref 3.5–5.2)
CO2: 20 mEq/L (ref 19–32)
CO2: 21 mEq/L (ref 19–32)
Calcium: 8.8 mg/dL (ref 8.4–10.5)
Chloride: 104 mEq/L (ref 96–112)
Chloride: 109 mEq/L (ref 96–112)
Creatinine, Ser: 1.25 mg/dL — ABNORMAL HIGH (ref 0.4–1.2)
Creatinine, Ser: 1.33 mg/dL — ABNORMAL HIGH (ref 0.4–1.2)
GFR calc Af Amer: 51 mL/min — ABNORMAL LOW (ref >60)
GFR calc non Af Amer: 39 mL/min — ABNORMAL LOW (ref >60)
GFR calc non Af Amer: 42 mL/min — ABNORMAL LOW (ref >60)
Glucose, Bld: 149 mg/dL — ABNORMAL HIGH (ref 70–99)
Sodium: 135 mEq/L (ref 135–145)
Sodium: 139 mEq/L (ref 135–145)
Total Bilirubin: 0.3 mg/dL (ref 0.3–1.2)
Total Bilirubin: 0.8 mg/dL (ref 0.3–1.2)

## 2010-08-10 LAB — CARDIAC PANEL(CRET KIN+CKTOT+MB+TROPI)
CK, MB: 1.9 ng/mL (ref 0.3–4.0)
Relative Index: INVALID (ref 0.0–2.5)
Total CK: 101 U/L (ref 7–177)
Total CK: 99 U/L (ref 7–177)
Troponin I: 0.01 ng/mL (ref 0.00–0.06)
Troponin I: 0.02 ng/mL (ref 0.00–0.06)

## 2010-08-10 LAB — DIFFERENTIAL
Basophils Absolute: 0 10*3/uL (ref 0.0–0.1)
Eosinophils Absolute: 0.3 10*3/uL (ref 0.0–0.7)
Lymphs Abs: 1.4 10*3/uL (ref 0.7–4.0)
Neutrophils Relative %: 46 % (ref 43–77)

## 2010-08-10 LAB — CBC
Platelets: 124 10*3/uL — ABNORMAL LOW (ref 150–400)
RBC: 3.61 MIL/uL — ABNORMAL LOW (ref 3.87–5.11)
WBC: 4 10*3/uL (ref 4.0–10.5)

## 2010-08-10 LAB — CK TOTAL AND CKMB (NOT AT ARMC)
CK, MB: 2.2 ng/mL (ref 0.3–4.0)
Relative Index: 1.8 (ref 0.0–2.5)
Total CK: 121 U/L (ref 7–177)

## 2010-08-10 LAB — POCT CARDIAC MARKERS
CKMB, poc: 1.6 ng/mL (ref 1.0–8.0)
Myoglobin, poc: 142 ng/mL (ref 12–200)
Troponin i, poc: <0.05 ng/mL (ref 0.00–0.09)

## 2010-08-10 LAB — LIPID PANEL
HDL: 80 mg/dL (ref >39)
Total CHOL/HDL Ratio: 2.7 RATIO
Triglycerides: 273 mg/dL — ABNORMAL HIGH (ref <150)
VLDL: 55 mg/dL — ABNORMAL HIGH (ref 0–40)

## 2010-08-10 LAB — LIPASE, BLOOD: Lipase: 115 U/L — ABNORMAL HIGH (ref 11–59)

## 2010-08-10 LAB — VITAMIN B12: Vitamin B-12: 432 pg/mL (ref 211–911)

## 2010-08-10 LAB — TROPONIN I: Troponin I: 0.01 ng/mL (ref 0.00–0.06)

## 2010-08-11 LAB — POCT I-STAT, CHEM 8
BUN: 23 mg/dL (ref 6–23)
Calcium, Ion: 1.01 mmol/L — ABNORMAL LOW (ref 1.12–1.32)
Chloride: 107 mEq/L (ref 96–112)
Glucose, Bld: 164 mg/dL — ABNORMAL HIGH (ref 70–99)
Potassium: 4 mEq/L (ref 3.5–5.1)

## 2010-08-11 LAB — DIFFERENTIAL
Lymphocytes Relative: 32 % (ref 12–46)
Monocytes Absolute: 0.5 10*3/uL (ref 0.1–1.0)
Monocytes Relative: 10 % (ref 3–12)
Neutro Abs: 2.8 10*3/uL (ref 1.7–7.7)
Neutrophils Relative %: 55 % (ref 43–77)

## 2010-08-11 LAB — CBC
HCT: 40.6 % (ref 36.0–46.0)
Hemoglobin: 13.4 g/dL (ref 12.0–15.0)
RBC: 3.99 MIL/uL (ref 3.87–5.11)
WBC: 5 10*3/uL (ref 4.0–10.5)

## 2010-08-11 LAB — BRAIN NATRIURETIC PEPTIDE: Pro B Natriuretic peptide (BNP): 64.3 pg/mL (ref 0.0–100.0)

## 2010-10-08 NOTE — H&P (Signed)
NAME:  SAFIYAH, CISNEY NO.:  [id number]   MEDICAL RECORD NO.:  [id number]          PATIENT TYPE:  INP   LOCATION:  5040                         FACILITY:  [facility]   PHYSICIAN:  Della Goo, M.D. DATE OF BIRTH:  [birth year]   DATE OF ADMISSION:  12/27/2006  DATE OF DISCHARGE:                              HISTORY & PHYSICAL   PRIMARY CARE PHYSICIAN:  Dr. Billee Cashing.   CHIEF COMPLAINT:  Nausea, vomiting, diarrhea.   HISTORY OF PRESENT ILLNESS:  This is a 77 year old female presenting to  the emergency department secondary to complaints of abdominal pain along  with nausea, vomiting, and diarrhea for 2 days.  She has a history of  diverticulosis, along with type 2 diabetes mellitus.  She denies having  any fevers or chills.  She denies having any melena or hematochezia or  chest pain, shortness of breath.  She reports that no one at home has  similar symptoms.Marland Kitchen   PAST MEDICAL HISTORY:  1. History of type 2 diabetes mellitus.  2. Hypertension.  3. Gastroesophageal reflux disease.  4. Benign essential tremors.  5. Gout.  6. Osteoarthritis.  7. Congestive heart failure syndrome.  8. Mild dementia.   MEDICATIONS:  1. Clonidine 0.2 mg 1 p.o. b.i.d.  2. Glipizide 5 mg 1 p.o. q.a.m.  3. Furosemide 40 mg 1 p.o. b.i.d.  4. Lyrica 50 mg 1 p.o. t.i.d.  5. Premarin 1.25 mg 1 p.o. daily.  6. Metformin 500 mg 1 p.o. b.i.d.  7. Aricept 10 mg 1 p.o. at bedtime.  8. Topamax 50 mg 1 p.o. at bedtime.  9. Zyrtec 10 mg 1 p.o. daily.  10.Prevacid 30 mg 1 p.o. daily.   ALLERGIES:  1. CODEINE.  2. MORPHINE.  3. PENICILLIN.   SOCIAL HISTORY:  The patient lives at home with her husband, and is a  nonsmoker, nondrinker.   FAMILY HISTORY:  Noncontributory.   PHYSICAL EXAMINATION FINDINGS:  GENERAL:  This is an obese 77 year old  female in discomfort but no acute distress.  VITAL SIGNS:  Temperature 98.1, blood pressure 168/95, heart rate 81,  respirations 18, O2  saturations 93%.  HEENT: Normocephalic, atraumatic.  Pupils equally round and reactive to  light.  Extraocular muscles are intact.  There is no scleral icterus.  Funduscopic benign.  Oropharynx is clear.  She is edentulous.  NECK:  Supple, full range of motion.  No thyromegaly, adenopathy, or  jugular venous distension.  CARDIOVASCULAR:  Regular rate and rhythm.  No murmurs, gallops, or rubs.  LUNGS:  Clear to auscultation bilaterally.  ABDOMEN:  Positive bowel sounds, mildly decreased, soft, nontender,  nondistended.  No hepatosplenomegaly, no rebound, no guarding.  EXTREMITIES:  Without cyanosis, clubbing, or edema.  NEUROLOGIC:  Mild generalized weakness.  There are no focal deficits.   LABORATORY STUDIES:  White blood cell count 6.4, hemoglobin 13.9,  hematocrit 41.5, MCV 101.1, platelets 182, neutrophils 57%, lymphocytes  31%.  Albumin 3.6, AST 26, ALT 25, total bilirubin 0.6, alkaline  phosphatase 83, lipase 27.  Urinalysis:  Small leukocyte esterase, and  positive urine nitrites, many epithelials, and many bacteria on  microscopic.  Acute abdominal series reveals no acute cardiopulmonary  process.  The abdominal films do reveal a focal ileus.  No evidence of  bowel obstruction, and no free air.   ASSESSMENT:  A 77 year old female being admitted with:  1. Abdominal pain.  2. Focal ileus.  3. Dehydration.  4. Type 2 diabetes mellitus.  5. Urinary tract infection.  6. Hypertension.   PLAN:  The patient will be placed on a clear liquid diet and placed on  IV fluids for rehydration and maintenance therapy.  IV ciprofloxacin and  IV Flagyl have been ordered.  She will continue on her regular  medications, except metformin has been placed on hold for now.  Sliding  scale coverage has also been ordered.  DVT and GI prophylaxis also have  been ordered.      Della Goo, M.D.  Electronically Signed     HJ/MEDQ  D:  12/28/2006  T:  12/29/2006  Job:  [id number]

## 2010-10-08 NOTE — Discharge Summary (Signed)
NAME:  Glenda Gonzalez, Glenda Gonzalez NO.:  [id number]   MEDICAL RECORD NO.:  [id number]          PATIENT TYPE:  INP   LOCATION:  5040                         FACILITY:  [facility]   PHYSICIAN:  [name], M.D.   DATE OF BIRTH:  11-16-1933   DATE OF ADMISSION:  12/28/2006  DATE OF DISCHARGE:  12/30/2006                               DISCHARGE SUMMARY   PRIMARY CARE PHYSICIAN:  Dr. Ronne Gonzalez.   DISCHARGE DIAGNOSES:  1. Escherichia coli urinary tract infection.  2. Hypertension.  3. Diabetes.  4. Gastroesophageal reflux disease.  5. Benign essential tremor.  6. Gout.  7. Osteoarthritis.  8. History of congestive heart failure, stable.  9. Dementia.  10.Renal insufficiency acute on chronic.  11.Focal ileus.  12.Diverticulosis.  13.Moderate to large hiatal hernia.   DISCHARGE MEDICATIONS:  1. Cipro 250 mg b.i.d. x5 days.  2. Clonidine 0.2 mg b.i.d.  3. Glipizide 5 mg daily.  4. Furosemide 40 mg b.i.d.  5. Lyrica 50 mg t.i.d.  6. Premarin 1.25 mg daily.  7. Metformin 500 mg b.i.d.  8. Aricept 10 mg q.h.s.  9. Topamax 50 mg q.h.s.  10.Zyrtec 10 mg daily.  11.Prevacid 30 mg daily.   CONSULTATIONS:  None.   BRIEF ADMISSION HISTORY OF PRESENT ILLNESS:  The patient is a 77-year-  old female who presented to the emergency department with complaints of  abdominal pain accompanied by nausea and vomiting as well as diarrhea  for two days.  On initial evaluation she was found to have evidence of  urinary tract infection, and therefore was admitted for further  evaluation and treatment.  For the full details, please see the  admission H&P done by Dr. Lovell Sheehan.   PROCEDURES/DIAGNOSTIC STUDIES:  1. CT scan of the abdomen and pelvis on December 27, 2006 showed no acute      abnormalities in the abdomen.  There was colonic diverticulosis      without evidence of diverticulitis and moderate to large hiatal      hernia.  There were no acute abnormalities in the pelvis.  2. Acute  abdominal series on December 27, 2006 showed no acute      cardiopulmonary process.  There were several gas-filled loops of      small bowel which were nondilated and thought to represent a focal      ileus.  There is no evidence of bowel obstruction or interpreted      free air.   DISCHARGE LABORATORY VALUES:  Sodium 140, potassium 4.2, chloride 108,  bicarb 19, BUN 17, creatinine 1.29, glucose 129.   HOSPITAL COURSE BY PROBLEM:  1. Nausea, vomiting, abdominal pain thought to be secondary to focal      ileus in the setting of urinary tract infection:  The patient was      admitted and initially empirically put on both Flagyl and Cipro.  A      further diagnostic workup was undertaken with the CT scan findings      fairly unremarkable.  Given this, it was felt that her focal ileus,      nausea, and vomiting were likely  due to her active urinary tract      infection.  Her diet was advanced slowly which she was able to      tolerate.  Subsequent culture data did reveal Escherichia coli      infection in her urine.  This organism was Cipro sensitive.  The      patient has completed two days of IV antibiotic therapy with Cipro,      and we will discharge her on an additional course of five days of      p.o. therapy.  Her nausea, vomiting, and abdominal pain have      completely resolved.  2. Hypertension:  The patient's hypertension was initially      suboptimally controlled due to holding several of her medications.      She will be discharged on her usual outpatient regimen, and she      should follow up for further surveillance of control by her primary      care physician.  3. Diabetes:  The patient was put on insulin therapy while in the      hospital, and her Glucophage was held.  Prior to discharge her p.o.      medications were resumed.  4. Gastroesophageal reflux disease:  The patient continues on proton      pump inhibitor therapy which she should continue given her fairly       moderate to large hiatal hernia.  5. Benign essential tremor:  This problem was stable throughout the      course of her hospitalization.  6. History of gout and osteoarthritis:  The patient continued her      usual medications.  7. History of congestive heart failure:  The patient has remained well-      compensated throughout the course of her hospitalization.  8. Dementia:  The patient was continued on her Aricept.  9. Renal insufficiency:  The patient had some underlying chronic renal      insufficiency, and her admission creatinine was elevated at 2.3.      On discharge this improved to its current value of 1.29.   DISPOSITION:  The patient is medically stable for discharge home.  She  is instructed to follow up with her primary care physician in 1-2 weeks.  She is instructed to continue all antibiotics until all pills are gone.  She is instructed to maintain good perineal hygiene and wipe only from  front to back.      [name], M.D.  Electronically Signed    CR/MEDQ  D:  12/30/2006  T:  12/30/2006  Job:  [id number]   cc:   Glenda Gonzalez, M.D.

## 2010-10-11 ENCOUNTER — Emergency Department (HOSPITAL_COMMUNITY): Payer: Medicare Other

## 2010-10-11 ENCOUNTER — Inpatient Hospital Stay (HOSPITAL_COMMUNITY)
Admission: EM | Admit: 2010-10-11 | Discharge: 2010-10-18 | DRG: 439 | Disposition: A | Discharge status: Skilled Nursing Facility | Payer: Medicare Other | Attending: Internal Medicine | Admitting: Internal Medicine

## 2010-10-11 DIAGNOSIS — G250 Essential tremor: Secondary | ICD-10-CM | POA: Diagnosis present

## 2010-10-11 DIAGNOSIS — Z888 Allergy status to other drugs, medicaments and biological substances status: Secondary | ICD-10-CM

## 2010-10-11 DIAGNOSIS — N184 Chronic kidney disease, stage 4 (severe): Secondary | ICD-10-CM | POA: Diagnosis present

## 2010-10-11 DIAGNOSIS — K449 Diaphragmatic hernia without obstruction or gangrene: Secondary | ICD-10-CM | POA: Diagnosis present

## 2010-10-11 DIAGNOSIS — K3189 Other diseases of stomach and duodenum: Secondary | ICD-10-CM | POA: Diagnosis present

## 2010-10-11 DIAGNOSIS — R0789 Other chest pain: Secondary | ICD-10-CM | POA: Diagnosis present

## 2010-10-11 DIAGNOSIS — K859 Acute pancreatitis without necrosis or infection, unspecified: Principal | ICD-10-CM | POA: Diagnosis present

## 2010-10-11 DIAGNOSIS — M25469 Effusion, unspecified knee: Secondary | ICD-10-CM | POA: Diagnosis present

## 2010-10-11 DIAGNOSIS — I129 Hypertensive chronic kidney disease with stage 1 through stage 4 chronic kidney disease, or unspecified chronic kidney disease: Secondary | ICD-10-CM | POA: Diagnosis present

## 2010-10-11 DIAGNOSIS — Z7982 Long term (current) use of aspirin: Secondary | ICD-10-CM

## 2010-10-11 DIAGNOSIS — K828 Other specified diseases of gallbladder: Secondary | ICD-10-CM | POA: Diagnosis present

## 2010-10-11 DIAGNOSIS — E46 Unspecified protein-calorie malnutrition: Secondary | ICD-10-CM | POA: Diagnosis present

## 2010-10-11 DIAGNOSIS — R1013 Epigastric pain: Secondary | ICD-10-CM | POA: Diagnosis present

## 2010-10-11 DIAGNOSIS — Z886 Allergy status to analgesic agent status: Secondary | ICD-10-CM

## 2010-10-11 DIAGNOSIS — F0390 Unspecified dementia without behavioral disturbance: Secondary | ICD-10-CM | POA: Diagnosis present

## 2010-10-11 DIAGNOSIS — M109 Gout, unspecified: Secondary | ICD-10-CM | POA: Diagnosis present

## 2010-10-11 DIAGNOSIS — K219 Gastro-esophageal reflux disease without esophagitis: Secondary | ICD-10-CM | POA: Diagnosis present

## 2010-10-11 DIAGNOSIS — E119 Type 2 diabetes mellitus without complications: Secondary | ICD-10-CM | POA: Diagnosis present

## 2010-10-11 DIAGNOSIS — M1990 Unspecified osteoarthritis, unspecified site: Secondary | ICD-10-CM | POA: Diagnosis present

## 2010-10-11 HISTORY — DX: Diverticulitis of intestine, part unspecified, without perforation or abscess without bleeding: K57.92

## 2010-10-11 HISTORY — DX: Gastro-esophageal reflux disease without esophagitis: K21.9

## 2010-10-11 LAB — COMPREHENSIVE METABOLIC PANEL
ALT: 21 U/L (ref 0–35)
AST: 36 U/L (ref 0–37)
Albumin: 3.1 g/dL — ABNORMAL LOW (ref 3.5–5.2)
Alkaline Phosphatase: 174 U/L — ABNORMAL HIGH (ref 39–117)
BUN: 19 mg/dL (ref 6–23)
Chloride: 100 mEq/L (ref 96–112)
GFR calc Af Amer: 53 mL/min — ABNORMAL LOW (ref >60)
Potassium: 4.5 mEq/L (ref 3.5–5.1)
Sodium: 135 mEq/L (ref 135–145)
Total Bilirubin: 0.2 mg/dL — ABNORMAL LOW (ref 0.3–1.2)
Total Protein: 8.1 g/dL (ref 6.0–8.3)

## 2010-10-11 LAB — DIFFERENTIAL
Eosinophils Absolute: 0.5 10*3/uL (ref 0.0–0.7)
Eosinophils Relative: 7 % — ABNORMAL HIGH (ref 0–5)
Lymphocytes Relative: 30 % (ref 12–46)
Lymphs Abs: 2 10*3/uL (ref 0.7–4.0)
Monocytes Relative: 7 % (ref 3–12)
Neutrophils Relative %: 55 % (ref 43–77)

## 2010-10-11 LAB — CBC
HCT: 38.3 % (ref 36.0–46.0)
MCH: 31.9 pg (ref 26.0–34.0)
MCV: 93.9 fL (ref 78.0–100.0)
Platelets: 206 10*3/uL (ref 150–400)
RBC: 4.08 MIL/uL (ref 3.87–5.11)

## 2010-10-11 LAB — POCT CARDIAC MARKERS
CKMB, poc: <1 ng/mL — ABNORMAL LOW (ref 1.0–8.0)
Troponin i, poc: <0.05 ng/mL (ref 0.00–0.09)

## 2010-10-11 NOTE — Discharge Summary (Signed)
[facility] [place]  Patient:    Glenda Gonzalez, Glenda Gonzalez Visit Number: [id number] MRN: [id number]          Service Type: EMS Location: [facility] Attending Physician:  Hanley Seamen Dictated by:   Lorelle Formosa, M.D. Admit Date:  02/23/2001 Discharge Date: 02/23/2001                             Discharge Summary  HISTORY OF PRESENT ILLNESS:  This patient is a 77 year old black married woman who presented complaining of shortness of breath and leg swelling which became worse over two days.  She was seen by emergency department physician, Dr. Freida Busman, and given Lasix 40 mg p.o.   Lab tests of EKG, chest x-ray, and arterial blood gases were done.  Chest x-ray showed mild congestive heart failure, and she was admitted for further evaluation.  PAST MEDICAL HISTORY: 1. Non-insulin-dependent diabetes mellitus of several years, approximately 5    or 6. 2. Hypertension.  Blood pressure was 205/104, and she was admitted for further evaluation.  HOME MEDICATIONS: 1. Metformin 500 mg q.d. 2. Toprol XL 100 mg q.d. 3. Aspirin 325 mg q.d. 4. Premarin 0.625 mg q.d. 5. Ibuprofen 800 mg t.i.d. 6. Glyburide 5 mg q.d. 7. Maxzide 25 mg, which the patient states she had not been taking.  PAST MEDICAL HISTORY: 1. The patient has been admitted here in the past for hysterectomy at [facility] and diabetes mellitus. 2. Sinusitis.  Other past medical history, social history, personal history, review of systems, and family history are outlined in admission history and physical.  LABORATORY DATA:  The patients total cholesterol was 221 with HDL 87, LDL 88, triglycerides 232.  Hemoglobin A1C 6.0 in my office last week.  PHYSICAL EXAMINATION:  VITAL SIGNS:  Blood pressure 174/58, pulse 58, respiratory rate 16, temperature 97.1.  Height 5 feet 1.5 inches, and weight 227 pounds.  GENERAL:  The patient was an alert and oriented black female who was  fuller sized.  HEENT:  Pupils are equal, round and reactive to light and accommodation. Mouth examination revealed moist mucosa with normal dentition.  NECK:  Supple, no jugular venous distension, bruits, nodes, or thyromegaly.  CHEST:  Clear to auscultation.  HEART:  Regular rate without murmur.  BREASTS:  Normal.  ABDOMEN:  Obese.  EXTREMITIES:  Moderate non-pitting edema.  NEUROLOGIC:  Grossly normal.  LABORATORY DATA:  EKG revealed normal sinus rhythm with 2-D echocardiogram revealing overall left ventricular systolic function largely decreased with an ejection fraction estimated at 40 to 50%, interpreted by Dr. Meade Maw. CT of abdomen status post cardiac catheterization revealed no evidence of retroperitoneal hemorrhage.  There was some evidence of ascites in the pelvis with diverticulosis of the sigmoid portion of the colon.  Myocardial perfusion study revealed decreased activity in the anterolateral wall of the left ventricle or stress images compared to rest images, with up to a 24% segmental difference in perfusion.  Appearance is most consistent with inducible ischemia, however, the ejection fraction was 79%.  CBC revealed a white count of 4.9, hematocrit 30.8, hemoglobin 10.7, platelets 191.  Repeat revealed a white count of 4.1, hemoglobin 12.6, hematocrit 36.4.  Troponin-I was 0.04, CPK 1.8, CK 95.  TSH 7.90.  HOSPITAL COURSE:  The patient was admitted to the hospital and given symptomatic medication.  She was seen in consultation by Dr. Armanda Magic, cardiologist, who performed cardiac catheterization  after Adenosine Cardiolite test with finding of borderline chronic obstructive pulmonary disease of a digital vessel.  This was felt not to require further attention, and the patient be treated medically.  This was not thought to be the cause of her congestive heart failure.  The patients heart failure was felt to be due on the basis of hypertension with  diabetes and obesity.  The patient was thus coordinated for outpatient care.  DISCHARGE MEDICATIONS: 1. Glucotrol XL 10 mg q.d. 2. Toprol XL 25 mg q.d. 3. Catapres 0.2 mg b.i.d. 4. Prinivil 30 mg q.d. 5. Lasix 40 mg q.d. 6. K-Dur 10 mEq q.d. 7. Enteric-coated aspirin q.d. 8. Premarin 0.625 mg q.d. 9. Ibuprofen 800 mg t.i.d. p.r.n. arthritis.  DIET:  No added salt, no concentrated sweets, has nutritionist type of consultation.  CONSULTATIONS:  Dr. Armanda Magic, cardiologist.  FOLLOWUP:  The patient will have a followup visit in the outpatient setting.  Her 2-D echocardiogram was suspicious for coronary artery disease. Dictated by:   Lorelle Formosa, M.D. Attending Physician:  Hanley Seamen DD:  03/04/01 TD:  03/04/01 Job: [id number]

## 2010-10-11 NOTE — Cardiovascular Report (Signed)
[place]  Patient:    Glenda Gonzalez, Glenda Gonzalez Visit Number: [id number] MRN: [id number]          Service Type: Attending:  Armanda Magic, M.D. Dictated by:   Armanda Magic, M.D. Proc. Date: 01/29/01   CC:         Lorelle Formosa, M.D.   Cardiac Catheterization  REFERRING PHYSICIAN:  Lorelle Formosa, M.D.  INDICATIONS:  This is a 77 year old, obese, black female with a history of poorly controlled hypertension, who presented with congestive heart failure. Adenosine Cardiolite study showed decreased perfusion uptake in the anterior lateral and apical segments with a normal LV function by Cardiolite at 79% but mildly reduced EF by echocardiogram at 40-50%.  The patient now presents for cardiac catheterization.  PROCEDURES PERFORMED:  Left heart catheterization, coronary angiography.  Left ventriculography was not performed due to the patient developing bradycardia with each contrast injection during coronary angiography.  INDICATIONS:  Congestive heart failure.  COMPLICATIONS:  None.  INTRAVENOUS ACCESS:  Right femoral artery, 6 French sheath.  DESCRIPTION OF PROCEDURE:  The patient is brought to the cardiac catheterization laboratory in a fasting, nonsedated state.  Informed consent was obtained.  The patient was connected to continuous heart rate and pulse oximetry monitoring, and intermittent blood pressure monitoring.  The right groin was prepped and draped in a sterile fashion.  Xylocaine 1% was used for local anesthesia.  Using the modified Seldinger technique, a 6 French sheath was placed in the right femoral artery. Under fluoroscopic guidance, a 6 French Judkins JL4 catheter was placed in the left coronary artery. Multiple cine films were taken in the 30 degree RAO, 40 degree LAO views. This catheter was then exchanged out for a 6 [place] JR4 catheter which was placed in the right coronary artery under fluoroscopic guidance.   Multiple cine films were taken in the 30 degree RAO, 40 degree LAO views.  This catheter was then exchanged out over a guide wire.  A 6 French angled pigtail catheter was placed under fluoroscopic guidance into the left ventricular cavity.  Left ventricular pressures were measured then the catheter was pulled back across the aortic valve with no significant pressure gradient noted.  The catheter was then removed over a guide wire.  At the end of the procedure all catheters and sheath were removed.  Manual compression was performed until adequate hemostasis was obtained.  The patient was transferred back to her room in stable condition.  RESULTS: 1. The left main coronary artery is widely patent and bifurcates into the    left anterior descending artery and left circumflex artery. 2. The left anterior descending artery has a proximal 30% narrowing    prior to the takeoff of the first diagonal branch which has an ostial    60-70% narrowing.  The rest of the LAD is widely patent throughout its    course. 3. Left circumflex artery gives rise to one obtuse marginal branch which is    widely patent and the left circumflex continues along the AV groove and    is widely patent. 4. Right coronary artery is widely patent throughout its course and bifurcates    into a posterior descending artery and posterolateral artery, both of    which are widely patent.  Again, left ventriculography was not performed due to patient being very sensitive to IV contrast injections with bradyarrhythmia.  LVEDP was 20 mmHg, left ventricular pressure 150/20 mmHg, aortic pressure 151/83 mmHg.  ASSESSMENT: 1. Borderline obstructive  disease of the diagonal. 2. Congestive heart failure. 3. Hypertension which is poorly controlled.  PLAN:  Dr. Katrinka Blazing to review films of diagonal.  Aggressive blood pressure control.  Increase lisinopril to 30 mg a day, increase Catapres to 0.2 mg b.i.d. Dictated by:   Armanda Magic,  M.D. Attending:  Armanda Magic, M.D. DD:  01/29/01 TD:  01/29/01 Job: [id number]

## 2010-10-11 NOTE — Discharge Summary (Signed)
NAME:  Glenda Gonzalez, Glenda Gonzalez            ACCOUNT NO.:  [id number]   MEDICAL RECORD NO.:  [id number]          PATIENT TYPE:  INP   LOCATION:  6734                         FACILITY:  [facility]   PHYSICIAN:  Jonna L. Robb Matar, M.D.DATE OF BIRTH:  1934/04/05   DATE OF ADMISSION:  10/28/2004  DATE OF DISCHARGE:  11/04/2004                                 DISCHARGE SUMMARY   FINAL DIAGNOSES:  1.  Gait disorder.  2.  Gout.  3.  Plantar fasciitis with a plantar calcaneal spur.  4.  Type 2 diabetes.  5.  Diabetic neuropathy.  6.  Hypertension.  7.  Congestive heart failure.  8.  Mild dementia.  9.  Essential tremor.   CONSULTANTS:  1.  Neurology, Dr. Sandria Manly.  2.  Orthopedics, Dr. Eulah Pont.   OPERATIONS:  None.   ALLERGIES:  1.  PENICILLIN.  2.  CODEINE.  3.  MORPHINE.   HISTORY:  A 77 year old African-American female had gradual progression of  difficulty ambulating, worsening pain especially in her ankle, finally got  to the point where she could not stand up straight and walk at all.  The  patient had been seen by the neurologist as an outpatient, but patient  refusing MRI of her lumbosacral spine.  She has an underlying medical  history of hypertension, diabetes, congestive heart failure.   Physical exam on admission was notable for the patient being able to flex  knees in bed but could not stand when her knees lock in the extended  position.  She had a resting tremor, some mild difficulties with short term  memory, no cogwheeling, decreased pinprick sensation to about L3.   HOSPITAL COURSE:  Work-up showed an elevated uric acid at 10.4, good control  of her glucose, sed rate 41.  She had some mild azotemia with a BUN of 33,  creatinine 1.7 which improved down to 18 and 1.4.  A1c was 7.9.  B12 normal.  TSH at the high end of normal 4.9.  MRI showed moderate small vessel  disease,  chronic sinusitis.  Cervical spine showed cervical spondylosis at  C5-6 and C6-7 with disk space narrowing  resulting in nerve root encroachment  but no significant cord compression.  MRI of the thoracic spine just showed  some small pleural effusions.  X-ray of the foot showed screws fixing a  distal fibular fracture and a plantar calcaneal spur.  CT of the head showed  a small area of an old lacunar infarct in the left basal ganglia.   The patient had her hypertension and diabetes controlled.  She was put on  ibuprofen and Darvocet for her foot which did have some improvement.  Her 24  hour urine evaluation for uric acid is still pending.  She was started on  Aricept by Dr. Sandria Manly.   DISPOSITION:   DISCHARGE MEDICATIONS:  1.  Catapres 0.2 b.i.d.  2.  Lasix 20 daily.  3.  Toprol XL 25 daily.  4.  Protonix 40 daily.  5.  Ibuprofen 600 t.i.d.  6.  Darvocet-N 100, 2 tabs q.4h. p.r.n.  7.  Aricept 5 mg/hr daily and  1 hour before her evening meal.  8.  She should be restarted on Metformin 500 mg b.i.d.  9.  Atacand 32 mg daily.  10. Amaryl 4 mg daily.  11. Sliding-scale insulin.   She has a foot brace to wear per orthopedics.  She will need physical  therapy.       JLB/MEDQ  D:  11/04/2004  T:  11/04/2004  Job:  [id number]

## 2010-10-11 NOTE — Consult Note (Signed)
[place]  Patient:    Glenda Gonzalez, Glenda Gonzalez Visit Number: [id number] MRN: [id number]          Service Type: MED Location: 3700 [id number] Attending Physician:  Judie Petit Dictated by:   Armanda Magic, M.D. Proc. Date: 01/26/01 Admit Date:  01/25/2001   CC:         Glenda Gonzalez, M.D.   Consultation Report  CHIEF COMPLAINT:  Shortness of breath.  HISTORY OF PRESENT ILLNESS:  This is a 77 year old obese black female with diabetes, history of hypertension, unknown cholesterol profile, and a family history of coronary disease, who was seen by her primary physician three days prior to admission at which time Toprol XL 100 mg was added to her regimen with atenolol 50 mg a day and Maxzide secondary to elevated blood pressure. Upon the advice of her pharmacist who told her she was taking too many medications, she stopped her Maxzide.  She had then a rather abrupt onset of dyspnea on exertion, orthopnea, and lower extremity edema which she had never had before.  She denied any history of nor current chest pain or chest tightness on admission.  When she was admitted to the hospital, blood pressure was 205/104, and chest x-ray revealed mild congestive heart failure.  She was given 40 mg p.o. Lasix because they were unable to establish IV access.  She had good diuresis from this with a net output of 2600 cc.  She is much improved in her symptoms without any shortness of breath at this time.  Her first set of cardiac enzymes was negative.  EKG showed normal sinus rhythm with no acute ST-T wave abnormalities.  There was early transition of R waves in the precordial leads consistent with possible old posterior MI versus just early transition from lead placement.  PAST MEDICAL HISTORY:  Significant for diabetes mellitus for five to six years, hypertension for greater than 15 years, obesity, and bronchitis.  PAST SURGICAL HISTORY:  Status  post left ankle repair with pinning status post trauma, hysterectomy with BSO, appendectomy, and ectopic pregnancy repair.  ALLERGIES:  PENICILLIN from which she develops a red rash.  CODEINE and MORPHINE give her hallucinations.  HOME MEDICATIONS: 1. Aspirin 325 mg a day. 2. Glyburide 5 mg a day. 3. Glucophage 500 mg q.d. 4. Atenolol 50 mg a day. 5. Toprol XL 100 mg q.d. 6. Premarin 0.625 mg a day. 7. Maxzide 1 q.d. which was stopped 3 days prior to admission.  CURRENT MEDICATIONS: 1. Catapres 0.1 mg b.i.d. 2. Sliding scale insulin. 3. Protonix 40 mg a day. 4. Zestril 20 mg a day. 5. Glucotrol XL 10 mg a day. 6. Aspirin 325 mg a day.  SOCIAL HISTORY:  She used to smoke tobacco but quit 13 years ago.  Prior to that, she smoked one-half pack per day for 30 to 40 years.  She denies alcohol use.  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  She is married with five children.  FAMILY HISTORY:  Her mother died at 32 of cerebral hemorrhage.  Her father died at 35 with MI.  She had three brothers who died of MI in their 57s.  She has one brother who died of Alzheimers, one sister who died secondary to complications of diabetes and stroke.  REVIEW OF SYSTEMS:  She has rare episodes of dizziness.  She denies any dysphagia.  No nausea, vomiting, diarrhea.  She occasionally has arthritis of her  left ankle.  She has no history of asthma.  PHYSICAL EXAMINATION:  VITAL SIGNS:  Blood pressure in the emergency room was 205/104, currently 142/66.  Heart rate 50 to 60 with normal sinus rhythm to sinus bradycardia. O2 saturation 97% on room air.  GENERAL:  This is a well-developed, obese black female in no acute distress.  HEENT:  Benign.  NECK:  Supple without lymphadenopathy.  Carotid upstrokes +2 bilaterally with no bruits.  LUNGS:  Clear to auscultation throughout.  HEART:  Regular rate and rhythm.  No murmurs, rubs, or gallops.  Normal  S1, S2.  ABDOMEN:  Soft, nontender, nondistended.  Active bowel sounds.  No hepatosplenomegaly, no abdominal bruits, no masses.  EXTREMITIES:  No cyanosis, erythema, or edema.  Good distal pulses.  LABORATORY DATA:  Sodium 139, potassium 3.5, chloride 111, BUN 17, CO2 20, creatinine 1.2, glucose 170.  White cell count 4.9, hematocrit 30.8, hemoglobin 10.7, platelet count 191.  CPK 95, MB 1.8, troponin 0.04.  Chest x-ray shows diffuse interstitial prominence with questionable mild edema versus bronchitis.  EKG shows sinus bradycardia with sinus rhythm at 60 beats per minute.  There is early R wave transition consistent with possible posterior MI in the past versus lead placement.  ASSESSMENT: 1. Congestive heart failure probable secondary to severe hypertensive urgency    with diastolic dysfunction.  She had an excellent response to diuretics    and blood pressure lowering. 2. Hypertension with improved blood pressure. 3. Positive cardiac risk factors for coronary artery disease without chest    pain. 4. Hypokalemia.  PLAN: 1. Agree with checking 2-D echocardiogram to assess left ventricular function    and diastolic function. 2. Check adenosine Cardiolite to rule out underlying coronary ischemia.  That    will be done tomorrow, September 4. 3. Continue current medications and aggressively increase Zestril as needed    for better blood pressure control.  At this time would not reinstitute    beta blocker given the patients resting heart rate of 50 to 60. Dictated by:   Armanda Magic, M.D. Attending Physician:  Judie Petit DD:  01/26/01 TD:  01/26/01 Job: [id number]

## 2010-10-11 NOTE — H&P (Signed)
NAME:  Glenda Gonzalez, Glenda Gonzalez NO.:  [id number]   MEDICAL RECORD NO.:  [id number]          PATIENT TYPE:  EMS   LOCATION:  MAJO                         FACILITY:  [facility]   PHYSICIAN:  Mallory Shirk, MD     DATE OF BIRTH:  Apr 23, 1934   DATE OF ADMISSION:  10/28/2004  DATE OF DISCHARGE:                                HISTORY & PHYSICAL   CHIEF COMPLAINT:  Inability to ambulate.   HISTORY OF PRESENT ILLNESS:  Ms. Alarid is a 77 year old African-American  woman who complains of progressively worsening difficulty ambulating, today  unable to stand straight.  The patient states that she has been feeling this  way for some time.  She was seen by Dr. Orlin Hilding last Thursday.  An MRI of  her L-spine was supposed to be scheduled.  The patient refuses MRI at the  present time, stating that she does not want this procedure done.  The  patient denies any other complaints.  At the present time, she is continent  of urine and stool.  No pain in the lower extremities.  No other complaints.  No loss of consciousness.  No dizziness, shortness of breath, chest pain, or  any other complaints.  No nausea, vomiting, or diarrhea.  No cough or any  other systemic complaints.   PAST MEDICAL HISTORY:  1.  Hypertension.  2.  Diabetes mellitus.  3.  CHF.   MEDICATIONS ON ADMISSION:  1.  Glucophage.  2.  Quinidine.  3.  Clonidine.  4.  Prevacid.  5.  Lasix.  6.  Cogentin.  7.  Toprol.  8.  Glipizide.  (The doses of these medications are unknown at the present time.).   ALLERGIES:  1.  PENICILLIN.  2.  CODEINE.  3.  MORPHINE.   FAMILY HISTORY:  Noncontributory.   SOCIAL HISTORY:  The patient lives with her husband.  Denies any alcohol,  tobacco, or drug use.   REVIEW OF SYSTEMS:  Other than in the HPI, negative.   PHYSICAL EXAMINATION ON ADMISSION:  VITAL SIGNS:  Blood pressure 152/58,  pulse 74, temperature 97.4, respirations 18, saturations 97% on room air.  GENERAL:  Elderly  African-American woman lying in bed in no acute distress.  Alert.  Oriented to person and place.  HEENT:  Normocephalic and atraumatic.  Pupils equal, round and reactive to  light.  Sclerae anicteric.  Mucous membranes moist.  NECK:  Supple.  No JVD, no LAD.  CVS:  S1 and S2.  Regular rate and rhythm.  No murmurs, rubs, or gallops.  LUNGS:  Clear to auscultation bilaterally.  No wheezes, no rales.  ABDOMEN:  Soft.  Positive bowel sounds.  No tenderness, no masses.  EXTREMITIES:  Strength 5/5 in all four extremities.  The patient is able to  flex the knees while in bed; however, the patient is unable to stand since  her knees lock in the extended position.  Unable to bend the knees to get  them to stand.  The patient just slides while she is trying to stand up.  NEUROLOGIC:  Cranial nerves II-XII grossly intact.  Sensory and motor within  normal limits.  DTRs 1+ in all 4 extremities.  Babinski downgoing  bilaterally.  Strength 5/5, upper and lower extremities.   IMAGING:  CT of the head revealed chronic microvascular white matter  disease.  No acute intracranial process.   LABORATORY DATA:  Labs on admission revealed WBC's of 6.5, hemoglobin 12.2,  hematocrit 35.6, MCV 98.9, platelets 175.  Sodium 134, potassium 5.4,  chloride 105, carbon dioxide 22, glucose 107, BUN 33, creatinine 1.7.  Total  protein 6.7, albumin 3.0.  AST 27, ALT 22, alkaline phosphatase 89.  Total  bilirubin 0.3.   ASSESSMENT AND PLAN:  A 77 year old Caucasian woman with progressively  worsening inability to ambulate.  Unable to walk today.  Brought in by  family.  1.  Inability to walk.  The patient was evaluated by Dr. Orlin Hilding 3 days      prior to admission.  An MRI was planned.  The patient has refused MRI      while she is here.  We will call Dr. Orlin Hilding and have her evaluate the      patient for further management.  Of note, Cogentin is a new medication      that has been recently added, as compared to her  discharge summary of      January 2003.  The patient is unable to tell us exactly when the      Cogentin was started or why.  In any event, we will continue to monitor      her carefully, and further management of her inability to ambulate will      be determined after neurologic evaluation.  2.  Diabetes mellitus.  We will start her on a sliding scale insulin.  Her      doses of Glucophage and glipizide are not known at the present time.      Family is not at bedside to provide more information.  We will call Dr.      Dimas Millin office and resume her medications at that doses.  3.  Hypertension.  We will resume clonidine at 0.2 mg b.i.d., which is the      dose on record.  We will also monitor her blood pressure closely.  4.  Congestive heart failure.  Per her last discharge summary, she is on      Lasix 20 mg p.o. daily.  We will resume that until her medications are      updated from her primary care physician's office.   DISPOSITION:  We will have a PT/OT evaluation.  The patient may need  rehabilitation.  Rehabilitation consultation has been requested.  After  evaluation, the patient will be discharged either to rehabilitation or with  home health.      GDK/MEDQ  D:  10/29/2004  T:  10/29/2004  Job:  [id number]   cc:   Lorelle Formosa, M.D.  [phone] [address]  [place]  [place] [address]  Fax: [phone]   Gustavus Messing. Orlin Hilding, M.D.  [address]  [address]  [place]  [place] [address]  Fax: [phone]

## 2010-10-11 NOTE — Discharge Summary (Signed)
NAME:  Glenda Gonzalez, Glenda Gonzalez NO.:  [id number]   MEDICAL RECORD NO.:  [id number]          PATIENT TYPE:  ORB   LOCATION:  4502                         FACILITY:  [facility]   PHYSICIAN:  Ranelle Oyster, M.D.DATE OF BIRTH:  [birth year]   DATE OF ADMISSION:  11/04/2004  DATE OF DISCHARGE:  11/12/2004                                 DISCHARGE SUMMARY   DISCHARGE DIAGNOSES:  1.  Dementia.  2.  Benign essential tremor.  3.  Left posterior tibial tendinitis.  4.  Non-insulin-dependent diabetes mellitus.  5.  Hypertension.   HISTORY OF PRESENT ILLNESS:  77 year old right handed black female with  history of six weeks of progressive memory loss and gait disorder with left  ankle pain.  Patient was admitted on October 29, 2004 for evaluation of gait  disorder.  Cranial CT scan with periventricular white matter changes  consistent with small vessel disease, no acute process.  MRI of the brain  negative.  I did show an old left basal ganglion infarction, moderate small  vessel disease.  MRI of cervical spine with significant cervical  spondylosis, cervical C5-C6 and C6-C7 with disc space narrowing but no cord  compression.  MRI thoracic spine negative.  Electroencephalogram negative.  Left foot films with no acute bony changes.  Follow up neurology service Dr.  Sandria Manly for dementia with conservative care, maintained on Aricept.  Orthopedic  services, Dr. Eulah Pont, posterior tibial tendinitis left ankle advised CAM  walker boot two to three weeks, weight-bearing as tolerated.  Follow up with  Dr. Eulah Pont.  Patient was admitted to subacute care services.   PAST MEDICAL HISTORY:  See discharge diagnoses.  No alcohol, no tobacco.   ALLERGIES:  PENICILLIN, CODEINE, MORPHINE.   SOCIAL HISTORY:  Lives with husband in [place].  Husband can assist on  discharge.  One level home, three steps to entry.  Local daughter can  assist.   MEDICATIONS PRIOR TO ADMISSION:  Were Cogentin, Clonidine,  Premarin, Toprol,  Glucophage, quinidine, Prevacid, Lasix, Elavil and Glipizide.  Doses were  not made available.   HOSPITAL COURSE:  Patient with progressive gains while on rehabilitation  services with therapies initiated daily.  The following issues were followed  during the patient's rehabilitation course.  Pertaining to Ms. Druck's  progressive dementia, she was followed by neurology services, Dr. Sandria Manly.  Patient remained on Aricept and monitored.  Benign essential tremor which  was without issue during rehabilitation stay.  A CAM walker boot remained in  place to the left lower extremity for posterior tibial tendinitis followed  by Dr. Eulah Pont of orthopedic services.  Patient was weight-bearing as  tolerated and would follow up as outpatient.  Patient was using Darvocet on  a limited basis for pain.  The patient's blood sugars did have some  variables of 219, 159, 95, 251.  Patient had been placed on Amaryl.  The  Glipizide had recently been discontinued.  She remained on a diabetic diet.  Blood pressures were controlled with Clonidine and Lasix.  Overall for the  patient's functional status she was a minimal assist for bed mobility and  transfers,  ambulating supervision with a rolling walker, 85 feet.  Simple  set up for activities of daily living except left lower extremity with CAM  walker boot.  Home health therapies had been arranged.   LABORATORY DATA:  Latest labs show sodium 132, potassium 4.4, BUN 32,  creatinine 1.8, hemoglobin 11.8, hematocrit 34.3.   DISCHARGE MEDICATIONS:  At the time of dictation, include:  1.  Toprol 50 mg daily.  2.  Aricept 5 mg daily.  3.  Clonidine 0.2 mg twice daily.  4.  Lasix 20 mg daily.  5.  Prevacid daily.  6.  Amaryl 2 mg daily.  7.  Darvocet as needed.   ACTIVITY:  As tolerated with walker, boot to left lower extremity.   DIET:  Diabetic diet.   FOLLOW UP:  Home health therapies had been arranged.  Patient would follow  up with  Dr. Sandria Manly, neurology services, as advised.  Patient will follow up  with Dr. [name], medical management, Dr. Eulah Pont in two to three weeks for  review of x-rays of left lower extremity.       DA/MEDQ  D:  11/11/2004  T:  11/11/2004  Job:  [id number]   cc:   Loreta Ave, M.D.  [address]  [place] [address]  Fax: [phone]   Genene Churn. Love, M.D.  [address]  [address]  [place]  [place] [address]  Fax: [phone]   Lorelle Formosa, M.D.  [phone] [address]  [place]  [place] [address]  Fax: [phone]

## 2010-10-11 NOTE — H&P (Signed)
NAME:  Glenda Gonzalez, Glenda Gonzalez NO.:  [id number]   MEDICAL RECORD NO.:  [id number]          PATIENT TYPE:  INP   LOCATION:  5011                         FACILITY:  [facility]   PHYSICIAN:  Gardiner Barefoot, MD    DATE OF BIRTH:  12/29/1933   DATE OF ADMISSION:  08/09/2006  DATE OF DISCHARGE:                              HISTORY & PHYSICAL   CHIEF COMPLAINT:  Abdominal pain.   HISTORY OF PRESENT ILLNESS:  Glenda Gonzalez is a 77 year old African-  American female with a history of diabetes who reports about a 1 day  history of abdominal pain.  She woke up over night with the pain noticed  bloody stools at that time as well as since.  The patient reports she  has never had this before.  She reports no fever and decreased p.o. for  the last several days leading up to this.  The patient reports that the  pain is moderate and diffuse throughout the abdomen with some mild  distention associated with some nausea.   PAST MEDICAL HISTORY:  1. Dementia.  2. Benign essential tremor.  3. Diabetes.  4. Hypertension.  5. Peripheral neuropathy.  6. Restless leg syndrome.  7. Congestive heart failure.  8. Seasonal allergies.  9. GERD.  10.Arthritis  11.Gout.   MEDICATIONS:  1. Glipizide 5 mg p.o. daily.  2. Furosemide 40 mg p.o. b.i.d.  3. Lyrica 50 mg p.o. t.i.d.  4. Premarin 1.25 mg once daily.  5. Metformin 500 mg p.o. twice daily.  6. Quinine sulfate 324 mg twice daily.  7. Arthrotek 75 p.o. twice daily.  8. Aricept 10 mg p.o. every evening.  9. Aspirin 81 mg p.o. daily.  10.Clonidine 0.2 mg p.o. twice daily.  11.Topamax 50 mg p.o. every evening.  12.Zyrtec 10 mg p.o. daily.  13.Prevacid 30 mg p.o. daily.   ALLERGIES:  CODEINE, MORPHINE, PENICILLIN.   SOCIAL HISTORY:  The patient lives with her husband, denies smoking.   FAMILY HISTORY:  CAD in the family.   REVIEW OF SYSTEMS:  Complete 12 point review of systems is negative  unless presented in history of present  illness.   PHYSICAL EXAMINATION:  VITAL SIGNS:  Temperature 98.2, pulse 69,  respirations 18, blood pressure 150/85, O2 sat is 99% on room air.  GENERAL:  The patient is awake, alert and oriented x3 and appears in no  acute distress.  CARDIOVASCULAR:  Regular rate and rhythm, no murmurs, rubs or gallops.  LUNGS:  Clear to auscultation bilaterally.  ABDOMEN:  Mildly distended, diffusely tender, soft and no rebound.  EXTREMITIES:  No cyanosis, clubbing or edema.   LABORATORY DATA:  CT scan significant for diverticulitis.  Lab shows  sodium 139, potassium 4.3, chloride 111, bicarb 21, BUN 4.9, creatinine  1.8, glucose 125, WBCs 4.1, hemoglobin 12.8, platelets 156, INR is 1.0.   IMPRESSION:  Diverticulitis.   PLAN:  1. Diverticulitis:  Will start the patient on IV Cipro and Flagyl and      start her on bowel rest and start IV fluids for maintenance.  2. Diabetes: Will start the patient on sliding scale  insulin and      holding p.o. medicine.  3. Hypertension:  I am holding p.o. medicines at this time secondary      to her diverticulitis  Will treat elevated blood pressures with IV      as needed.  4. Dementia, will continue the patient's Aricept.  5. Peripheral neuropathy.  The patient takes p.o. medicines, however      is in on bowel rest and therefore will treat with IV pain medicines      as needed.      Gardiner Barefoot, MD  Electronically Signed     RWC/MEDQ  D:  08/09/2006  T:  08/09/2006  Job:  [id number]

## 2010-10-11 NOTE — Discharge Summary (Signed)
NAME:  Glenda Gonzalez, Glenda Gonzalez NO.:  [id number]   MEDICAL RECORD NO.:  [id number]          PATIENT TYPE:  INP   LOCATION:  6734                         FACILITY:  [facility]   PHYSICIAN:  Gertha Calkin, M.D.DATE OF BIRTH:  May 18, 1934   DATE OF ADMISSION:  10/28/2004  DATE OF DISCHARGE:                                 DISCHARGE SUMMARY   Audio too short to transcribe (less than 5 seconds)       JD/MEDQ  D:  10/31/2004  T:  10/31/2004  Job:  [id number]

## 2010-10-11 NOTE — Procedures (Signed)
CONCLUSION:  This is a borderline slow study that I would still consider  normal given that the posterior dominant background rhythm is 8 Hz, it shows  synchronous and symmetric waveforms and amplitudes bilaterally and gives no  evidence of focal slowing or epileptiform discharges.       ZO:XWRU  D:  10/31/2004 17:25:32  T:  10/31/2004 22:02:46  Job #:  [id number]   cc:   Lorelle Formosa, M.D.  [phone] [address]  [place]  [place] [address]  Fax: [phone]

## 2010-10-11 NOTE — Discharge Summary (Signed)
[facility]  Patient:    Glenda Gonzalez, Glenda Gonzalez Visit Number: [id number] MRN: [id number]          Service Type: EMS Location: [place] Attending Physician:  Lorre Nick Dictated by:   Lorelle Formosa, M.D. Admit Date:  08/13/2001 Discharge Date: 08/13/2001                             Discharge Summary  ADMISSION DIAGNOSES: 1. Transient confusion. 2. Weakness. 3. Non-insulin dependent diabetes. 4. Hypertension.  DISCHARGE DIAGNOSES: 1. Hypertension. 2. Non-insulin dependent diabetes. 3. Azotemia with dehydration. 4. History of congestive heart failure.  DISCHARGE CONDITION:  Stable.  DISCHARGE DISPOSITION: Follow-up with me in approximately one week.  HISTORY:  The patient is a 77 year old black woman who presented to the emergency room after an illness which was somewhat extended and caused her to be confused.  The patient apparently became ill and had difficulty eating with some nausea and vomiting and abdominal pain which was intermittent over approximately two weeks.  She states that she thought she had lost approximately 26 pounds.  She was evaluated in the emergency department with vital signs showing a blood pressure of 127/84, pulse 96, temperature 96.4, weight 208 pounds.  The patient primarily complained of shaking and inability to stand with a decreased appetite.  She had admitted otherwise stomach upset and some diarrhea.  The patients home medications which she had not taken revealed lisinopril 40 mg daily, Glucotrol XL 10 mg daily, Clonidine 0.2 mg b.i.d., Kaochlor 10 mg daily, Toprol XL 25 mg daily, Lasix 40 mg daily, ibuprofen 800 mg p.r.n., enteric coated aspirin 325 mg daily, Ambien 10 mg q.h.s. p.r.n. sleep.  PAST MEDICAL HISTORY: Outlined in her previous admission here of April 16, 2001.  PHYSICAL EXAMINATION:  VITAL SIGNS:  Blood pressure 127/84, pulse 99, respiratory rate 22, temperature 96.4.  GENERAL:   The patient appeared generally weak and tremulous.  HEENT:  Pupils equal, round and reactive to light and accommodation. Extraocular movements are intact.  The neck was supple.  CHEST:  Clear to auscultation.  HEART:  Regular rate and rhythm without murmur.  BREASTS:  Normal.  ABDOMEN:  Slight and slightly obese and no tenderness.  EXTREMITIES:  Symmetrical range of motion.  SKIN:  Unremarkable.  NEUROLOGIC:  The patient was grossly weak.  The patient was admitted for 24 hour observation and did not improve and was thus continued for an inpatient type evaluation.  LABORATORIES:  CBC:  White count 4.8,  hemoglobin 13.0, hematocrit 38.1, platelets 180,000, differential with 53% segs, 48 lymphs, 6 monocytes, 1 eosinophil and 1 basophil.  Chemistries initially revealed a sodium of 134, pot 6.4, glucose 132, BUN 24, creatinine 2.8.  At discharge sodium was 140 with a potassium of 4.4, chloride 114, C02 19, glucose 119, BUN 20 and creatinine 1.0. Initial amylase was 161 and lipase was 56.  Total bilirubin was 0.2 and on repeat was 0.4.  Urinalysis:  Clear yellow urine with a specific gravity of 1.012, pH 5.0, urobilinogen 0.2.  HOSPITAL COURSE:  The patient was admitted to the hospital and placed on an 18,000 calorie, no added salt diet and given IV fluids of normal saline at 200 cc per hour.  She was given Protonix 40 mg daily and Tylenol 650 mg p.o. q.4h p.r.n. pain. She was also placed on 325 mg enteric coated aspirin and resumed on Toprol XL 25 mg and lisinopril 40  mg daily.  She improved pretty rapidly and was able to be placed back on Glucotrol 10 mg XL and Catapres 0.1 mg p.o. b.i.d. for her blood pressure control. She was able to have recovery and return of her diet, IV was discontinued and she was ambulatory.  Telemetry was discontinued and she was discharged for outpatient management.  DISCHARGE MEDICATIONS: 1. Ecotrin 325 mg daily. 2. Glucotrol XL 10 mg daily. 3.  Lisinopril 40 mg daily. 4. Clonidine 0.2 mg morning and evening. 5. Toprol XL 25 mg daily. 6. Ambien 10 mg q.h.s. p.r.n. sleep. 7. Kaochlor 10 mEq p.o. daily. 8. Lasix 40 mg one half tablet daily.  DIET:  She was to continue no concentrated sweets and no added salt diet. Discharge weight was 206.8 pounds. Dictated by:   Lorelle Formosa, M.D. Attending Physician:  Lorre Nick DD:  10/07/01 TD:  10/10/01 Job: [id number]

## 2010-10-11 NOTE — Discharge Summary (Signed)
NAME:  Glenda Gonzalez, Glenda Gonzalez NO.:  [id number]   MEDICAL RECORD NO.:  [id number]          PATIENT TYPE:  INP   LOCATION:  5011                         FACILITY:  [facility]   PHYSICIAN:  [name], M.D.   DATE OF BIRTH:  [birth year]   DATE OF ADMISSION:  08/09/2006  DATE OF DISCHARGE:  08/14/2006                               DISCHARGE SUMMARY   ADDENDUM:   PRIMARY CARE PHYSICIAN:  Lorelle Formosa, M.D.   For a complete list of the discharge diagnoses, procedures, and  diagnostic studies, secondary diagnoses, and hospital course through  August 12, 2006, please see the previously dictated discharge summary  done by Dr. Garnette Czech.   ADDITIONAL DIAGNOSIS:  1. Macrocytic anemia, outpatient workup recommended.  2. Mild renal insufficiency.   DISCHARGE MEDICATIONS:  1. Aspirin 81 mg daily.  2. Premarin 0.625 mg daily.  3. ___________ 10 mg daily.  4. Qualiquin 324 mg b.i.d.  5. Indomethacin 50 mg t.i.d. p.r.n.  6. Furosemide 20 mg daily.  7. Glipizide XL 5 mg daily.  8. Lyrica 50 mg t.i.d.  9. Promethazine 25 mg q.i.d.  10.Prevacid 30 mg daily.  11.Metformin 500 mg b.i.d.  12.Clonidine 0.2 mg q.h.s.  13.Topamax 50 mg q.h.s.  14.Aricept 10 mg q.h.s.  15.Avelox 400 mg daily through August 23, 2006.  16.Flagyl 500 mg q.8 h through August 23, 2006.   DISCHARGE LABORATORY VALUES:  Sodium was 134, potassium 4, chloride 107,  bicarb 23, BUN 17, creatinine 1.39, glucose 167.  White blood cell count  of 6.4, hemoglobin 11.9, hematocrit 34.9, platelets 167 with an MCV of  102.1.   REMAINDER OF HOSPITAL COURSE:  The patient has remained medically stable  for the past 48 hours and wishes to go home.   At this juncture, she will complete a 2-week course of Avelox and Flagyl  for her diverticulitis and also the Avelox will cover her pneumonia.  Her chest x-ray has remained stable.  She was seen in consultation with  physical therapy who do recommend ongoing home physical  therapy which we  will set up.  The patient should follow up with her  primary care physician early next week.  Dr. [name] should evaluate  whether her mild macrocytic anemia is due to folate or a B12 deficiency.  Additionally, he should note whether her renal function is at its  baseline with her current creatinine of 1.39, and adjust her medications  if she continues to have renal insufficiency.      [name], M.D.  Electronically Signed     CR/MEDQ  D:  08/14/2006  T:  08/14/2006  Job:  [id number]   cc:   Lorelle Formosa, M.D.

## 2010-10-11 NOTE — H&P (Signed)
NAME:  MENDI, CONSTABLE NO.:  [id number]   MEDICAL RECORD NO.:  [id number]          PATIENT TYPE:  ORB   LOCATION:  4502                         FACILITY:  [facility]   PHYSICIAN:  Ranelle Oyster, M.D.DATE OF BIRTH:  02-03-1934   DATE OF ADMISSION:  11/04/2004  DATE OF DISCHARGE:                                HISTORY & PHYSICAL   MEDICAL RECORD NUMBER:  [id number]   DATE:  November 04, 2004   CHIEF COMPLAINT:  Left ankle pain.   HISTORY OF PRESENT ILLNESS:  This is a 77 year old right-handed black female  with a six-week history of progressive memory loss and difficulties with  gait associated with left ankle pain.  The patient's workup was positive for  small vessel disease and dementia.  No acute infarcts documented on MRI.  Cervical spine films were negative for cord compression or radiculopathy.  The patient had no fractures of the left foot.  She was diagnosed with a  left posterior tibial tendinitis, and relative rest and CAM boot was  recommended for two to three weeks by Dr. Eulah Pont.  The patient was placed on  Aricept for dementia on November 04, 2004.  The patient is minimum assistance  for bed mobility, moderate assistance for transfers, total assistance for  gait.   REVIEW OF SYSTEMS:  Positive for low back pain, weakness, tremor, mild  anxiety.  The patient slept fairly well.  She denies shortness of breath or  chest pain.  She does have decreased appetite.  She does have some  constipation.   PAST MEDICAL HISTORY:  Positive for CHF, non-insulin-requiring diabetes,  hypertension.  Negative for alcohol or tobacco use.   FAMILY HISTORY:  Noncontributory.   SOCIAL HISTORY:  The patient lives with husband in [place].  Husband can  assist her at home.  She has a one-level house with three steps to enter.  The patient was independent with a cane and rather sedentary prior to  admission.   MEDICATIONS PRIOR TO ADMISSION:  Cogentin, Clonidine, Premarin,  Toprol,  Glucophage, quinidine, Prevacid, Lasix, Elavil, glipizide.   ALLERGIES:  PENICILLIN, CODEINE, and MORPHINE.   LABORATORY DATA:  Hemoglobin 11.8, white count 4.3.  BUN 18, creatinine 1.2,  sodium 136, potassium 3.6.   HABITS:  VITAL SIGNS:  Blood pressure 120/70, pulse 80, temperature 98.4,  respiratory rate 18.  GENERAL:  Pleasant and obese.  Alert and oriented x 3.  She had difficulties  with processing and sequencing.  She remembered particular current events  when queried.  HEENT:  Pupils equal, round, and reactive to light and accommodation.  Extraocular eye movements intact.  Ear, nose, and throat exam was benign.  NECK:  Supple without JVD or adenopathy.  CHEST: Clear to auscultation bilaterally without wheezes, rales, or rhonchi.  HEART:  Regular rate and rhythm without murmurs, rubs, or gallops.  ABDOMEN:  Soft, nontender.  Bowel sounds are positive.  MUSCULOSKELETAL:  The patient had intact range of motion both upper  extremities to both passive and active movement.  Skin was generally intact.  Muscle exam was 4+ to 5/5 in upper extremities, 4/5 right  lower extremity,  2+ to 3/5 proximal left lower extremity due to pain.  Did not test ankle  movement today.  She did have intact toe flexion and extension.  NEUROLOGIC:  Cranial nerves were intact.  Reflexes were decreased.  Sensation was appropriate.  The patient had fair judgment.  Memory was  functional with simple questioning today.  The patient was a bit anxious.   ASSESSMENT AND PLAN:  #1.  Functional deficits secondary to left posterior  tibial tenonitis.  #2.  Dementia.  #3.  Benign essential tremor.  #4.  Obesity.   Begin subacute level rehabilitation with supervision, occasional minimum  assistance goals.   ESTIMATED LENGTH OF STAY:  7+ days.   PROGNOSIS:  Fair.  Prognosis will depend on carry over from day to day   PAIN MANAGEMENT:  Continue ibuprofen and Darvocet-N 100.  The patient has  done well  with this combination thus far.  The patient also might benefit  from ice to the left ankle.   #4.  Deep vein thrombosis prophylaxis.  Thigh-high TED hose.   #5.  Non-insulin-requiring diabetes mellitus.  Will check CBGs and cover  with sliding scale insulin.   #6.  Hypertension.  Continue Clonidine and Lasix.   #7.  Mental status and memory.  Continue Aricept at the current dose of 5 mg  q.h.s.       ZTS/MEDQ  D:  11/04/2004  T:  11/04/2004  Job:  [id number]

## 2010-10-11 NOTE — Discharge Summary (Signed)
NAME:  Glenda Gonzalez, Glenda Gonzalez NO.:  Gonzalez   MEDICAL RECORD NO.:  [id number]          PATIENT TYPE:  INP   LOCATION:  5011                         FACILITY:  [facility]   PHYSICIAN:  Hettie Holstein, D.O.    DATE OF BIRTH:  15-Jun-1933   DATE OF ADMISSION:  08/09/2006  DATE OF DISCHARGE:                               DISCHARGE SUMMARY   INTERIM SUMMARY:   PRIMARY CARE PHYSICIAN:  Lorelle Formosa, M.D.   PRINCIPAL DIAGNOSIS:  Diverticulitis as evidence on CT scan performed  August 09, 2006 revealing mild diverticulitis and sigmoid diverticulosis  and in addition a hiatal hernia as noted.   SECONDARY DIAGNOSES:  1. Pneumonia as evidenced on CT scan as well on the 16th revealing      right lower lobe air space disease, currently on Levaquin and      improving.  2. Type 2 diabetes.  3. Morbid obesity.  4. Essential tremor.  5. Stable congestive heart failure based on history though not a great      deal of history is provided to be able to quantify more      specifically elaborate on her congestive heart failure. References      to cardiac evaluation in September 2002 by Dr. Armanda Magic did      reveal history of congestive heart failure on the basis of severe      hypertensive urgency and diastolic dysfunction. A 2-D      echocardiogram cannot be located in the system at the time. In any      event, she does appear to be asymptomatic in this regard.  6. Diarrhea, resolving.   CURRENT MEDICATIONS:  1. Aspirin 81 mg daily.  2. Catapres 0.2 mg q.h.s.  3. Premarin 0.625 mg daily.  4. D5 half normal saline at 75. We are going to saline lock this at      this time.  5. Aricept 10 mg p.o. q.h.s.  6. Lovenox 40 mg subcu q.24.  7. NovoLog sliding scale.  8. Levaquin 500 mg daily.  9. Claritin 10 mg daily.  10.Flagyl 500 mg every 8 hours.  11.Protonix 40 mg daily.  12.Lyrica 50 mg t.i.d.  13.Quinine sulfate 324 mg b.i.d.  14.Topamax 50 mg q.h.s.  15.Nebulizer  treatments with albuterol q.2 p.r.n.  16.Dilaudid 0.5 to 1 mg q.4 p.r.n. basis.  17.Loperamide on a p.r.n. basis.   DISPOSITION:  Glenda Gonzalez still continues to be symptomatic with  reference to her abdominal discomfort and pain. Some of this may be  extending from her hiatal hernia. She is complaining of dysuria at this  point and we are checking a urinalysis. I am going to follow her  clinical course at this point and await the results of the urinalysis  and further mobilization prior to discharge home. Will ask case  management to assist and occupational/physical therapy in determining  her home needs.   HISTORY OF PRESENT ILLNESS:  For full details please refer to the H&P as  dictated by Dr. Staci Righter. However, briefly, Glenda Gonzalez is a  pleasant, 77 year old, African-American female with a  history of  diabetes, reported today a history of abdominal pain. She woke up  overnight with pain and noticed bloody stools. In any event, she  reported that she had never had this before. She had fever, decreased  oral intake and moderate diffuse pain and distention about her abdomen.   HOSPITAL COURSE:  Admitting physician, Dr. Luciana Axe, discovered evidence of  diverticulitis on a CT scan. In addition she was in acute renal failure  felt to be prerenal in nature. She was admitted and initially started on  Flagyl and Cipro. CT scan was suggestive, however, of a right lower lobe  air space infiltrate/air space disease. Her regimen was adjusted to  encompass this infection in addition, so she was transitioned to Flagyl  and Levaquin which she appeared to tolerate. She then began to complain  of dysuria and otherwise she remained hemodynamically stable. Her  hemoglobin was 12.5 and she did appear to have some fluid retention and  elevations in her systolic blood pressure with her previous history of  congestive heart failure and likely diastolic in nature. We are  initiating a dose of labetalol  and saline locking her IV fluids. She has  experienced some relief with respect to her diarrhea with Imodium though  she continues to complain of abdominal pain mostly postprandial. In any  event, we are going to attempt to mobilize Glenda Gonzalez as much as we can  and follow her clinical course to ultimate disposition.   A final addendum will be added at ultimate time of discharge.      Hettie Holstein, D.O.  Electronically Signed     ESS/MEDQ  D:  08/12/2006  T:  08/12/2006  Job:  [id number]   cc:   Lorelle Formosa, M.D.

## 2010-10-11 NOTE — Consult Note (Signed)
NAME:  Glenda Gonzalez, Glenda Gonzalez NO.:  [id number]   MEDICAL RECORD NO.:  [id number]          PATIENT TYPE:  INP   LOCATION:  6734                         FACILITY:  [facility]   PHYSICIAN:  Genene Churn. Love, M.D.    DATE OF BIRTH:  1934-01-06   DATE OF CONSULTATION:  10/29/2004  DATE OF DISCHARGE:                                   CONSULTATION   This 77 year old right-handed black married female from [place] [address], [place] was admitted by Dr. Gust Rung for evaluation of inability to walk and is  currently seen for that problem.   HISTORY OF PRESENT ILLNESS:  The history from the patient is not clear  because there has been some dementia that has developed by history on a  relatively rapid basis.  This may have begun as recently as six weeks to  four months ago and has definitely gotten worse in the last week.  She has  had a CT scan of the brain which has revealed chronic microvascular disease,  but no focal abnormalities.  Over the last four months according to notes  from Dr. Orlin Hilding, but according to the patient one week to four weeks there  has been a history of progressive gait disorder without associated falls.  The patient was seen by Dr. Orlin Hilding approximately four days ago, was  evaluated for tremor thought to represent features of both essential tremor  and Parkinson's tremor and was placed on Cogentin.  The patient denies any  bowel or bladder dysfunction or injuries but in the hospital since coming to  the emergency room October 28, 2004 has been noted to have stool incontinence.  There has been no history of back pain radiating into her legs.  She has  complained of pain occurring in her legs.  She has had no significant  weakness in her arms.  She does state that she has double vision but her  history is not really reliable.  She has a known prior history of diabetes  mellitus for seven years, hypertension for seven years, and congestive heart  failure.  She has been  followed by Dr. Billee Cashing.   MEDICATIONS ON ADMISSION:  Cogentin, clonidine, Premarin, Toprol XL,  Glucophage, quinidine, Prevacid, Lasix, amitriptyline, Glipizide.   She does not use alcohol.  She does not smoke cigarettes.   Her past medical history otherwise is unremarkable as best we can determine  at this time.   PHYSICAL EXAMINATION:  GENERAL:  Well-developed black female lying in bed.  VITAL SIGNS:  Blood pressure right and left arm 160/80, heart rate 96 and  regular.  NEUROLOGIC:  She had a tremor that was both present with kinetic qualities  and postural qualities that was bilateral.  She also had a tremor in her  voice and she had a resting tremor in her right hand and arm.  She was  alert.  She was oriented to person and place and year, but not to month, day  of week, or date.  She could not remember any of three objects at five  minutes.  She knew the President, [employer], but  not the [employer].  She  had difficulty with calculations, asked the number of nickels in a dollar.  She made mistakes in spelling backwards, at least earth and world she  could not spell backwards, made one mistake in each.  She followed one, two,  and three step commands.  She had a tremor in her voice.  She had poor  calculations.  Cranial nerve examination revealed her visual fields full.  The disks were flat.  The extraocular movements were full.  There was a  negative red lens testing.  Hearing was intact.  Air conduction was greater  than bone conduction.  Tongue was midline and gags were present.  She had a  voice tremor.  Motor examination revealed outstretched hand and arm tremor  and resting tremor in her right hand and arm.  No cogwheeling.  No increased  tone.  She had good strength in the upper extremities and at least 4+/5 in  her lower extremities with some weakness in the iliopsoas bilaterally.  She  could lift each leg off the bed without difficulty.  Her sensory  examination  revealed a decreased pin prick level to approximately L2 or L3 bilaterally.  She had decreased rectal tone.  Joint position was intact in the upper and  lower extremities.  Two-point discrimination.  She made one mistake in 10  fingers.  Her deep tendon reflexes were 2+ in the upper extremities, in the  knees with absent ankle jerks and plantar response was downgoing to  withdrawal on the right and upgoing on the left.   IMPRESSION:  1.  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  2.  Dementia of rapid onset by history, though this is not absolutely clear      and further corroboration will be needed (code 780.53).  3.  Diabetic neuropathy (code 357.2).  4.  Essential tremor (code 333.1).  5.  Diabetes (code 250.6).  6.  Hypertension (code 796.2).  7.  Congestive heart failure (code 716.9).   PLAN:  Take the patient for MRI study.  Obtain B12, sedimentation rate, and  CPK.      JML/MEDQ  D:  10/29/2004  T:  10/29/2004  Job:  [id number]

## 2010-10-11 NOTE — Procedures (Signed)
Glenda Gonzalez. [facility]  Patient:    Glenda, Gonzalez Visit Number: [id number] MRN: [id number]          Service Type: MED Location: 5500 [id number] Attending Physician:  Judie Petit Proc. Date: 04/18/01 Admit Date:  04/16/2001   CC:         Glenda Gonzalez, M.D.   Procedure Report  PROCEDURE PERFORMED:  Colonoscopy.  ENDOSCOPIST:  Petra Kuba, M.D.  INDICATION:  Patient with pain, diarrhea, and abnormal CAT scan worrisome for right-sided ischemic colitis.  CONSENT:  Consent was signed after risks, benefits, methods, and options were thoroughly discussed to Mr. and Glenda Gonzalez in the hospital.  MEDICATIONS:  Fentanyl 100 mcg and Versed 10 mg.  DESCRIPTION OF PROCEDURE:  Rectal inspection is pertinent for small external hemorrhoids.  Digital exam was negative.  The video colonoscope was inserted. Unfortunately, the sigmoid was full of diverticula as there was some spasm and tortuosity and we could not safely advance through the sigmoid, so we elected to withdraw and we then readvanced the pediatric video adjustable colonoscope and were much easier able to get through the sigmoid.  Once through the sigmoid unfortunately, the scope began to loop, and with abdominal pressure we were able to advance to the level of the ileocecal valve.  Unfortunately, despite rolling her on her back and rolling her on her right side with various abdominal pressures, and even rolling her back on her left side, we were unable to advance into the cecal pole.  We could see half the cecum as well as the ileocecal valve well, but not the complete cecum.  We could not enter the terminal ileum.  Other than left-sided diverticula, no obvious abnormalities were seen on insertion.  There was no signs of ischemic colitis on the colon particularly on the right side.  The scope was slowly withdrawn.  The prep was adequate.  There was some liquid stool that required  washing and suctioning with a slow withdrawal through the colon, other than the sigmoid diverticula and wall edema from the scope and the prep.  No other abnormalities were seen. Once back in the rectum, the scope was then retroflexed and pertinent for some internal hemorrhoids small.  The scope was drained, the air was suctioned, and the scope removed.  The patient tolerated the procedure fair.  There were no obvious or immediate complications.  ENDOSCOPIC DIAGNOSES: 1. Internal and external hemorrhoids, small. 2. Left-sided significant diverticula. 3. Unable to advance the regular scope pass the sigmoid. 4. Able to advance the pediatric scope to the level of the    ileocecal valve with half the cecum being seen and no signs of    abnormalities, ischemia, etc.  PLAN: 1. Slowly advance diet. 2. Consider a future colonoscopy as needed using Dipervan. 3. Consider and MRA to screen for significant vascular abdominal    disease, hold angiogram for now. 4. Consider a small-bowel follow-through as an outpatient versus    just following clinically and asked to see back p.r.n. Attending Physician:  Judie Petit DD:  04/18/01 TD:  04/19/01 Job: [id number]

## 2010-10-11 NOTE — Consult Note (Signed)
[place]  Patient:    Glenda Gonzalez, Glenda Gonzalez Visit Number: [id number] MRN: [id number]          Service Type: MED Location: [id number] Attending Physician:  Judie Petit Dictated by:   Petra Kuba, M.D. Proc. Date: 04/16/01 Admit Date:  04/16/2001   CC:         Lorelle Formosa, M.D.   Consultation Report  HISTORY:  Patient with a few-day history of periumbilical abdominal pain.  It came on suddenly and actually went away for a day but seemed to come back. She has really noticed no change in bowel movement, has not seen any blood, and has not had any nausea, vomiting, fever, chills, or night sweats.  She has not eaten at any at-risk restaurant and has not had any sick contacts.  She presented to the emergency room.  White count was normal, but a CT scan showed questionable edema of the cecum, terminal ileum, and right colon.  I am consulted for further workup and plans.  PAST MEDICAL HISTORY:  Pertinent for hypertension, non-insulin-dependent diabetes, as well as history of a hysterectomy, history of congestive heart failure, a probable small-bowel obstruction with lysis of adhesions.  FAMILY HISTORY:  Negative for any obvious GI problem.  SOCIAL HISTORY:  She does not smoke or drink.  Will use some ibuprofen and aspirin but minimizes other over-the-counter medicine use.  ALLERGIES:  Pertinent for PENICILLIN, CODEINE, and MORPHINE.  MEDICATIONS:  Include metformin, Toprol, aspirin, Premarin, ibuprofen, glyburide, and Maxzide.  REVIEW OF SYSTEMS:  Negative for any urinary complaints or any other complaints.  She did say she has lost weight but says that was from her fluid pills, and she has been trying to lose weight.  PHYSICAL EXAMINATION:  GENERAL:  No acute distress, lying comfortably in the bed.  HEENT:  Sclerae nonicteric.  NECK:  Supple without obvious adenopathy.  LUNGS:  Clear.  CARDIAC:  Regular rate  and rhythm.  ABDOMEN:  Soft.  There is an occasional bowel sound.  She has minimal periumbilical discomfort, no guarding or rebound.  RECTAL:  Exam by Dr. [name] pertinent for being guaiac negative.  LABORATORY DATA:  Pertinent for a normal CBC including MCV, platelets, hemoglobin, and white count of 7.9, neutrophils 83%.  PT 13.7.  Amylase and lipase normal.  Chemistries normal except for BUN 30, glucose 166, albumin 3.1, normal liver tests.  CT scan reviewed with Dr. Karin Golden pertinent for no significant vascular disease. There was some minimal free fluid in the pelvis and edema of the terminal ileum, cecum, and right colon.  ASSESSMENT: 1. Abdominal pain and normal CT scan, concerning for ischemia. 2. Multiple medical problems including coronary artery disease, hypertension,    diabetes, and previous hysterectomy as well as possible bowel obstruction    with surgery and lysis of adhesions.  PLAN:  Follow CBC.  Sips of clear liquids are okay.  Hold antibiotics for now but have a low threshold for starting.  Considerations of a colonoscopy as her next test would probably be indicated unless she destabilized, then would probably need surgical consult or an angiogram.  Risks, benefits, and methods of colonoscopy were discussed.  Depending on how she is doing tomorrow, would probably schedule on Sunday.  Will follow with you.  Thank you very much for the consultation. Dictated by:   Petra Kuba, M.D. Attending Physician:  Judie Petit DD:  04/16/01 TD:  04/18/01 Job: [id number]

## 2010-10-12 ENCOUNTER — Inpatient Hospital Stay (HOSPITAL_COMMUNITY): Payer: Medicare Other

## 2010-10-12 LAB — COMPREHENSIVE METABOLIC PANEL
AST: 25 U/L (ref 0–37)
Albumin: 2.7 g/dL — ABNORMAL LOW (ref 3.5–5.2)
Calcium: 8.8 mg/dL (ref 8.4–10.5)
Chloride: 104 mEq/L (ref 96–112)
Creatinine, Ser: 1.08 mg/dL (ref 0.4–1.2)
GFR calc Af Amer: >60 mL/min (ref >60)
Total Bilirubin: 0.2 mg/dL — ABNORMAL LOW (ref 0.3–1.2)
Total Protein: 6.8 g/dL (ref 6.0–8.3)

## 2010-10-12 LAB — CBC
HCT: 35 % — ABNORMAL LOW (ref 36.0–46.0)
Hemoglobin: 11.4 g/dL — ABNORMAL LOW (ref 12.0–15.0)
MCH: 30.8 pg (ref 26.0–34.0)
MCV: 94.6 fL (ref 78.0–100.0)
Platelets: 204 10*3/uL (ref 150–400)
RBC: 3.7 MIL/uL — ABNORMAL LOW (ref 3.87–5.11)
WBC: 5.7 10*3/uL (ref 4.0–10.5)

## 2010-10-12 LAB — LIPID PANEL
Cholesterol: 242 mg/dL — ABNORMAL HIGH (ref 0–200)
LDL Cholesterol: 126 mg/dL — ABNORMAL HIGH (ref 0–99)
Total CHOL/HDL Ratio: 3.8 RATIO
Triglycerides: 266 mg/dL — ABNORMAL HIGH (ref <150)
VLDL: 53 mg/dL — ABNORMAL HIGH (ref 0–40)

## 2010-10-12 LAB — TSH: TSH: 5.094 u[IU]/mL — ABNORMAL HIGH (ref 0.350–4.500)

## 2010-10-12 LAB — GLUCOSE, CAPILLARY: Glucose-Capillary: 232 mg/dL — ABNORMAL HIGH (ref 70–99)

## 2010-10-12 LAB — CARDIAC PANEL(CRET KIN+CKTOT+MB+TROPI)
CK, MB: 1.7 ng/mL (ref 0.3–4.0)
Relative Index: INVALID (ref 0.0–2.5)
Relative Index: INVALID (ref 0.0–2.5)
Total CK: 79 U/L (ref 7–177)
Total CK: 79 U/L (ref 7–177)
Troponin I: <0.3 ng/mL (ref <0.30)
Troponin I: <0.3 ng/mL (ref <0.30)

## 2010-10-12 LAB — PHOSPHORUS: Phosphorus: 3.7 mg/dL (ref 2.3–4.6)

## 2010-10-13 LAB — COMPREHENSIVE METABOLIC PANEL
Albumin: 2.7 g/dL — ABNORMAL LOW (ref 3.5–5.2)
Alkaline Phosphatase: 151 U/L — ABNORMAL HIGH (ref 39–117)
BUN: 14 mg/dL (ref 6–23)
Creatinine, Ser: 1.1 mg/dL (ref 0.4–1.2)
Glucose, Bld: 242 mg/dL — ABNORMAL HIGH (ref 70–99)
Total Bilirubin: 0.2 mg/dL — ABNORMAL LOW (ref 0.3–1.2)
Total Protein: 6.9 g/dL (ref 6.0–8.3)

## 2010-10-13 LAB — CBC
HCT: 34.5 % — ABNORMAL LOW (ref 36.0–46.0)
MCH: 31.1 pg (ref 26.0–34.0)
MCV: 96.6 fL (ref 78.0–100.0)
Platelets: 186 10*3/uL (ref 150–400)
RDW: 15.8 % — ABNORMAL HIGH (ref 11.5–15.5)

## 2010-10-13 LAB — GLUCOSE, CAPILLARY
Glucose-Capillary: 251 mg/dL — ABNORMAL HIGH (ref 70–99)
Glucose-Capillary: 193 mg/dL — ABNORMAL HIGH (ref 70–99)
Glucose-Capillary: 231 mg/dL — ABNORMAL HIGH (ref 70–99)

## 2010-10-13 LAB — LIPASE, BLOOD: Lipase: 34 U/L (ref 11–59)

## 2010-10-13 LAB — T3, FREE: T3, Free: 2 pg/mL — ABNORMAL LOW (ref 2.3–4.2)

## 2010-10-13 LAB — T4, FREE: Free T4: 1.02 ng/dL (ref 0.80–1.80)

## 2010-10-14 ENCOUNTER — Inpatient Hospital Stay (HOSPITAL_COMMUNITY): Payer: Medicare Other

## 2010-10-14 ENCOUNTER — Encounter (HOSPITAL_COMMUNITY): Payer: Self-pay | Admitting: Radiology

## 2010-10-14 LAB — GLUCOSE, CAPILLARY
Glucose-Capillary: 275 mg/dL — ABNORMAL HIGH (ref 70–99)
Glucose-Capillary: 250 mg/dL — ABNORMAL HIGH (ref 70–99)
Glucose-Capillary: 235 mg/dL — ABNORMAL HIGH (ref 70–99)

## 2010-10-14 MED ORDER — IOHEXOL 300 MG/ML  SOLN
100.0000 mL | Freq: Once | INTRAMUSCULAR | Status: AC | PRN
  Administered 2010-10-14: 100 mL via INTRAVENOUS

## 2010-10-15 ENCOUNTER — Inpatient Hospital Stay (HOSPITAL_COMMUNITY): Payer: Medicare Other

## 2010-10-15 LAB — CBC
MCH: 31.6 pg (ref 26.0–34.0)
MCHC: 33.5 g/dL (ref 30.0–36.0)
MCV: 94.2 fL (ref 78.0–100.0)
Platelets: 172 10*3/uL (ref 150–400)
RDW: 15.4 % (ref 11.5–15.5)

## 2010-10-15 LAB — LIPASE, BLOOD: Lipase: 10 U/L — ABNORMAL LOW (ref 11–59)

## 2010-10-15 LAB — COMPREHENSIVE METABOLIC PANEL
Albumin: 2.4 g/dL — ABNORMAL LOW (ref 3.5–5.2)
BUN: 8 mg/dL (ref 6–23)
Creatinine, Ser: 1.14 mg/dL (ref 0.4–1.2)
Total Protein: 6.6 g/dL (ref 6.0–8.3)

## 2010-10-15 LAB — GLUCOSE, CAPILLARY
Glucose-Capillary: 272 mg/dL — ABNORMAL HIGH (ref 70–99)
Glucose-Capillary: 238 mg/dL — ABNORMAL HIGH (ref 70–99)

## 2010-10-16 DIAGNOSIS — R269 Unspecified abnormalities of gait and mobility: Secondary | ICD-10-CM

## 2010-10-16 DIAGNOSIS — R5381 Other malaise: Secondary | ICD-10-CM

## 2010-10-16 LAB — SYNOVIAL CELL COUNT + DIFF, W/ CRYSTALS: Lymphocytes-Synovial Fld: 3 % (ref 0–20)

## 2010-10-16 LAB — GLUCOSE, CAPILLARY
Glucose-Capillary: 324 mg/dL — ABNORMAL HIGH (ref 70–99)
Glucose-Capillary: 250 mg/dL — ABNORMAL HIGH (ref 70–99)
Glucose-Capillary: 389 mg/dL — ABNORMAL HIGH (ref 70–99)
Glucose-Capillary: 153 mg/dL — ABNORMAL HIGH (ref 70–99)

## 2010-10-17 LAB — GLUCOSE, CAPILLARY
Glucose-Capillary: 175 mg/dL — ABNORMAL HIGH (ref 70–99)
Glucose-Capillary: 396 mg/dL — ABNORMAL HIGH (ref 70–99)

## 2010-10-17 LAB — DIFFERENTIAL
Basophils Absolute: 0 10*3/uL (ref 0.0–0.1)
Eosinophils Absolute: 0.2 10*3/uL (ref 0.0–0.7)
Eosinophils Relative: 5 % (ref 0–5)
Lymphs Abs: 1.2 10*3/uL (ref 0.7–4.0)
Neutrophils Relative %: 59 % (ref 43–77)

## 2010-10-17 LAB — CBC
HCT: 30.2 % — ABNORMAL LOW (ref 36.0–46.0)
Hemoglobin: 10.3 g/dL — ABNORMAL LOW (ref 12.0–15.0)
MCH: 31.4 pg (ref 26.0–34.0)
MCHC: 34.1 g/dL (ref 30.0–36.0)

## 2010-10-17 LAB — BASIC METABOLIC PANEL
BUN: 17 mg/dL (ref 6–23)
CO2: 26 mEq/L (ref 19–32)
Calcium: 8.3 mg/dL — ABNORMAL LOW (ref 8.4–10.5)
Creatinine, Ser: 1.35 mg/dL — ABNORMAL HIGH (ref 0.4–1.2)
Glucose, Bld: 186 mg/dL — ABNORMAL HIGH (ref 70–99)

## 2010-10-18 LAB — DIFFERENTIAL
Basophils Absolute: 0 10*3/uL (ref 0.0–0.1)
Eosinophils Relative: 0 % (ref 0–5)
Lymphocytes Relative: 18 % (ref 12–46)
Neutro Abs: 3.8 10*3/uL (ref 1.7–7.7)

## 2010-10-18 LAB — CBC
HCT: 33.7 % — ABNORMAL LOW (ref 36.0–46.0)
Hemoglobin: 11.6 g/dL — ABNORMAL LOW (ref 12.0–15.0)
RDW: 14.8 % (ref 11.5–15.5)
WBC: 4.9 10*3/uL (ref 4.0–10.5)

## 2010-10-18 LAB — GLUCOSE, CAPILLARY: Glucose-Capillary: 235 mg/dL — ABNORMAL HIGH (ref 70–99)

## 2010-10-18 LAB — BASIC METABOLIC PANEL
GFR calc non Af Amer: 41 mL/min — ABNORMAL LOW (ref >60)
Glucose, Bld: 330 mg/dL — ABNORMAL HIGH (ref 70–99)
Potassium: 3.8 mEq/L (ref 3.5–5.1)
Sodium: 135 mEq/L (ref 135–145)

## 2010-10-24 NOTE — H&P (Signed)
NAME:  Glenda Gonzalez, Glenda Gonzalez NO.:  [id number]  MEDICAL RECORD NO.:  [id number]           PATIENT TYPE:  I  LOCATION:  4743                         FACILITY:  [facility]  PHYSICIAN:  Lonia Blood, M.D.      DATE OF BIRTH:  [birth year]  DATE OF ADMISSION:  10/11/2010 DATE OF DISCHARGE:                             HISTORY & PHYSICAL   PRIMARY CARE PHYSICIAN:  Dr. [name].  PRESENTING COMPLAINT:  Abdominal pain.  HISTORY OF PRESENT ILLNESS:  The patient is a 77 year old female with multiple medical problems including diabetes, CHF, who presented with upper abdominal pain, and chest pain.  Symptoms started 2 days ago.  She had apparently a fall 2 days ago.  Since then, she had the midsternal chest pain, shortness of breath, and nausea.  Denied any diaphoresis. Pain was worse with palpation and movement, also worsened by food, not relieved by anything.  She decided to come to the emergency room because it is unbearable.  She had prior history of pancreatitis in August of last year.  Otherwise, she has been stable.  PAST MEDICAL HISTORY:  Significant for CHF or diastolic dysfunction, type 2 diabetes, GERD, hypertension, morbid obesity, history of UTI, chronic kidney disease stage IV, history of biliary dyskinesia, requiring outpatient followup, osteoarthritis, history of diabetic neuropathy, GERD.  ALLERGIES:  To CODEINE, MORPHINE, and PENICILLIN.  CURRENT MEDICATIONS:  The patient is unable to give, but was previously on Colace, Keflex, Reglan, Actos, amlodipine, Arthrotec, aspirin, clonidine, diphenhydramine, donepezil, Lasix, Klor-Con, Lyrica, metformin, Nexium, ondansetron, Premarin, and topiramate.  SOCIAL HISTORY:  The patient lives in [place].  She lives with her husband.  She is married.  She usually walks with a walker.  Denied any tobacco, alcohol, or IV drug use.  FAMILY HISTORY:  Noncontributory.  REVIEW OF SYSTEMS:  All systems reviewed currently and  negative except per HPI.  PHYSICAL EXAMINATION:  VITAL SIGNS:  Temperature 98.0, blood pressure initially 195/98 with a pulse 89, respiratory rate 18, sats 96% on room air. GENERAL:  She is awake, alert, oriented, morbidly obese woman.  She is in no acute distress. HEENT:  PERRL.  EOMI.  No pallor.  No jaundice.  No rhinorrhea. NECK:  Supple.  No JVD.  No lymphadenopathy. RESPIRATORY:  Shows good air entry bilaterally.  No wheezes.  No rales. No crackles. CARDIOVASCULAR SYSTEM:  Shows S1-S2.  No audible murmur. ABDOMEN:  Soft, obese, with mild epigastric tenderness, and positive bowel sounds. EXTREMITIES:  No edema, cyanosis, or clubbing. SKIN:  No rashes or ulcers. MUSCULOSKELETAL:  No joint swelling or tenderness.  LABS:  Her white count is 6.5, hemoglobin 13.0, platelet count of 206 with normal differentials.  Initial cardiac enzymes are negative. Sodium is 135, potassium 4.5, chloride 100, CO2 22, glucose 177, BUN 19, creatinine 1.20, calcium 9.7, total protein 8.1 with albumin 3.1.  Her lipase is 108.  BNP 189.  Chest x-ray showed no active cardiopulmonary disease.  ASSESSMENT:  This is 77 year old female, presenting with what appears to be acute pancreatitis.  The cause is not clear, but more than likely this could be related to some gallstones.  She still  has her gallbladder in place and she has had prior diverticulitis, although, no evidence of gallstones.  Her last study in November gastric emptying study showed there was delayed retention of 38% so she did have some gastroparesis. She also had a HIDA scan that was abnormal which was done also in November 2011.  She was seen back then by Dr. Riley Lam, and he recommended that she probably will have needed cholecystectomy, although, that was not done at that admission.  PLAN: 1. Acute pancreatitis.  We will admit the patient for pain control,     bowel rest, and control of her nausea.  We will hydrate her and     follow her  lipase level.  We will check right upper quadrant     abdominal ultrasound and get surgical consult.  The patient may     need to get her gallbladder out at this point especially if the     findings are abnormal. 2. Hypertension.  Blood pressure is elevated.  I will use IV Lopressor     for now until the patient stabilizes. 3. Diabetes.  I will keep her on sliding scale insulin. 4. GERD.  Continuous PPI. 5. Osteoarthritis.  Pain control per home medication. 6. Morbid obesity. 7. CHF, mainly diastolic dysfunction.  We will keep an eye on her to     avoid fluid overload. 8. Protein calorie malnutrition.  Once the patient is start taking     p.o.'s, we will encourage her on high protein intake. 9. Gouty arthritis.  Again, the patient is stable.  At this point, no     evidence of gout.     Lonia Blood, M.D.     Verlin Grills  D:  10/12/2010  T:  10/12/2010  Job:  [id number]  Electronically Signed by Lonia Blood M.D. on 10/24/2010 11:53:00 AM

## 2010-11-06 NOTE — Discharge Summary (Signed)
NAME:  Glenda Gonzalez, Glenda Gonzalez NO.:  [id number]  MEDICAL RECORD NO.:  [id number]           PATIENT TYPE:  I  LOCATION:  4743                         FACILITY:  [facility]  PHYSICIAN:  Ramiro Harvest, MD    DATE OF BIRTH:  03/30/1934  DATE OF ADMISSION:  10/11/2010 DATE OF DISCHARGE:  10/18/2010                              DISCHARGE SUMMARY   PRIMARY CARE PHYSICIAN:  Lorelle Formosa, MD  DISCHARGE DIAGNOSES: 1. Acute pancreatitis, resolved. 2. Acute right knee gouty arthritis improved. 3. Abdominal pain secondary to problem acute pancreatitis, resolved. 4. Biliary dyskinesia. 5. Delayed gastric emptying. 6. Type 2 diabetes, hemoglobin A1c of 9.6. 7. Hypertension. 8. Gastroesophageal reflux disease. 9. Dementia. 10.History of diastolic heart failure. 11.Obesity. 12.Chronic kidney disease stage IV. 13.Osteoarthritis. 14.History of diabetic neuropathy. 15.Benign essential tremor. 16.Mild dementia. 17.History of diverticulosis. 18.Moderate to large hiatal hernia.  DISCHARGE MEDICATIONS: 1. Prednisone 40 mg p.o. daily x2 days then 20 mg p.o. daily x2 days,     then stop. 2. Tramadol 25 mg p.o. q.8 h. p.r.n. 3. Nexium 40 mg p.o. b.i.d. 4. Actos 15 mg p.o. daily. 5. Norvasc 5 mg p.o. daily. 6. Arthrotec 75/200 one tablet p.o. b.i.d. p.r.n. 7. Artificial tears 1 drop to each eye daily as needed. 8. Aspirin 81 mg p.o. daily. 9. Clonidine 0.2 mg p.o. b.i.d. 10.Diphenhydramine 25 mg p.o. daily p.r.n. 11.Donepezil 10 mg p.o. at bedtime. 12.Lasix 40 mg p.o. b.i.d. 13.Klor-Con 20 mEq p.o. b.i.d. 14.Lyrica 50 mg p.o. t.i.d. 15.Metformin 500 mg p.o. b.i.d. 16.Reglan 5 mg before meals and at bedtime. 17.Zofran 4 mg p.o. t.i.d. p.r.n. 18.Premarin 0.625 mg 2 tablets p.o. daily. 19.Topiramate 50 mg p.o. at bedtime.  DISPOSITION AND FOLLOWUP:  The patient will be discharged to a skilled nursing facility at [facility].  The patient is to follow up with Dr. Magnus Ivan of  [facility] on November 01, 2010, at 9:45 a.m. to follow up on her right knee gouty arthritis.  The patient is also to follow up with PCP in 1-2 weeks to follow up on an acute pancreatitis.  The patient does have a history of biliary dyskinesia and will benefit from referral to a general surgeon for possible elective cholecystectomy. The patient will need thyroid function studies repeat in about 4-8 weeks and the patient is also to follow up with GI per PCP referral for her esophageal dysmotility.  CONSULTATIONS DONE:  An orthopedic consultation was done.  The patient was seen in consultation by Dr. Magnus Ivan of [facility] on Oct 16, 2010.  PROCEDURES PERFORMED: 1. The patient did have aspiration of the right knee effusion per Dr.     Magnus Ivan on Oct 16, 2010. 2. The patient had a chest x-ray done on Oct 11, 2010, that showed no     active cardiopulmonary process.  The patient had abdominal     ultrasound done on Oct 12, 2010, that showed technically     challenging study secondary to bowel gas and body habitus.  The     patient may have some layering tiny echogenic stones within the     lumen of the gallbladder, no gallbladder  wall thickening or per     cholecystic fluid is evident.  CT of the abdomen and pelvis were     done on Oct 15, 2010, that showed fatty infiltration of the     pancreas without evidence for focal inflammation, stable moderate     hiatal hernia, fatty infiltration of the liver, stable renal     atrophia, atherosclerosis, sigmoid diverticulosis without     diverticulitis stable to slight increase in fluid collection at the     base of the patient's laparotomy incision.  X-ray of the right knee     was done on Oct 15, 2010, that showed no acute osseous abnormality.     Prominent joint effusion.  BRIEF ADMISSION HISTORY AND PHYSICAL:  Ms. Glenda Gonzalez is a 77 year old [name] with multiple medical problems including diabetes and  heart failure who presented with upper abdominal pain and chest pain.  Her symptoms started 2 days prior to admission.  The patient apparently had a fall 2 days prior to admission.  Since then the patient had, had midsternal chest pain, shortness of breath, and nausea. She denied any diaphoresis.  Pain was worse with palpation and movement also worsened by food and not relieved by anything.  The patient presented to the ED because the pain was unbearable.  She had a prior history of pancreatitis in August over the year before, otherwise she had been stable.  For the rest admission history and physical, please see H and P dictated by Dr. Mikeal Hawthorne of job number [id number].  HOSPITAL COURSE: 1. Acute pancreatitis.  The patient was admitted with abdominal     pain/chest pain and felt to be in acute pancreatitis.  Lipase     levels, which were obtained came back elevated at 108.  Cardiac     enzymes, which was cycled were negative x3.  The patient did have a     pro-BNP which was done which was 189.  The patient was placed on a     bowel rest, supportive care with IV fluids.  She was monitored and     followed, also maintained on her Reglan.  The patient was monitored     and followed.  Abdominal ultrasound was done with results as stated     above, which was negative for acute cholecystitis.  CT of the     abdomen and pelvis was also done on Oct 15, 2010, with results as     stated above.  The patient improved clinically during the     hospitalization, her pain was controlled and her abdominal pain     improved.  She was started on a clear liquid diet and her diet was     advanced as tolerated to a diabetic diet.  The patient tolerated     her p.o. intake, did not have any further abdominal pain and she     was monitored and followed, improved clinically and it was felt     that by day of discharge the patient's acute pancreatitis had     resolved.  The patient will be discharged in stable and  improved     condition to follow up with PCP as stated above. 2. She has acute right knee gouty arthritis.  During the     hospitalization, the patient did have some complaints of right knee     pain.  X-rays of the right knee, which were done on Oct 15, 2010,  were consistent with a prominent joint effusion.  A orthopedic     consultation was obtained.  The patient was seen in consultation by     Dr. Magnus Ivan of [facility] on Oct 16, 2010, and at which     point in time right knee aspiration was done as well as an     injection of Depo-Medrol steroid was also placed which seemed to     improve the patient's pain.  The knee aspirate was sent for cell     count and crystal examination did grow out intracellular monosodium     urate crystals as well as extracellular monosodium urate crystals,     synovial fluid was turbid and amber in color.  The patient remained     afebrile.  The patient had a normal white count.  It was felt this     was likely secondary to an acute gouty arthritis.  The patient was     placed on a steroid taper with improvement in her right knee pain.     The patient will be discharged home on a steroid taper and is to     follow up with Dr. Magnus Ivan as stated above on November 01, 2010 at 9:45     a.m. for followup on the right knee gouty arthritis.  The patient     will be discharged in stable and improved condition. 3. Abdominal pain.  The patient was noted to present with abdominal     pain, felt likely secondary to acute pancreatitis.  Abdominal     ultrasound was done, results as stated above as well as CT of the     abdomen and pelvis.  The patient improved clinically with bowel     rest and supportive care and acute pancreatitis/abdominal pain had     resolved by day of discharge.  The patient will be discharged in     stable and improved condition.  The rest of the patient's chronic     medical issues have remained stable throughout the  hospitalization     and the patient will be discharged in stable condition.  On day of     discharge vital signs; temperature 97.3, pulse of 91, blood     pressure 154/82, respirations 20, and satting 97% on 2 L nasal     cannula.  DISCHARGE LABORATORY DATA:  Sodium 135, potassium 3.8, chloride 98, bicarb 24, glucose 330, BUN 28, creatinine 1.28, and a calcium of 8.7. CBC with a white count of 4.9, hemoglobin 11.6, hematocrit 33.7, and platelet count of 225 with an ANC of 3.8.  It has been a pleasure taking care of Ms. Orli Degrave.     Ramiro Harvest, MD     DT/MEDQ  D:  10/18/2010  T:  10/18/2010  Job:  [id number]  cc:   Lorelle Formosa, M.D. Dr. Wynelle Fanny S. Vernell Morgans, M.D.  Electronically Signed by Ramiro Harvest MD on 11/06/2010 02:36:19 PM

## 2010-12-26 ENCOUNTER — Other Ambulatory Visit: Payer: Self-pay | Admitting: Cardiology

## 2011-01-27 ENCOUNTER — Emergency Department (HOSPITAL_COMMUNITY)
Admission: EM | Admit: 2011-01-27 | Discharge: 2011-01-27 | Disposition: A | Discharge status: Home / Self Care | Payer: Medicare Other | Attending: Emergency Medicine | Admitting: Emergency Medicine

## 2011-01-27 ENCOUNTER — Emergency Department (HOSPITAL_COMMUNITY): Payer: Medicare Other

## 2011-01-27 DIAGNOSIS — E119 Type 2 diabetes mellitus without complications: Secondary | ICD-10-CM | POA: Insufficient documentation

## 2011-01-27 DIAGNOSIS — K219 Gastro-esophageal reflux disease without esophagitis: Secondary | ICD-10-CM | POA: Insufficient documentation

## 2011-01-27 DIAGNOSIS — M25559 Pain in unspecified hip: Secondary | ICD-10-CM | POA: Insufficient documentation

## 2011-01-27 DIAGNOSIS — I509 Heart failure, unspecified: Secondary | ICD-10-CM | POA: Insufficient documentation

## 2011-01-27 DIAGNOSIS — M76899 Other specified enthesopathies of unspecified lower limb, excluding foot: Secondary | ICD-10-CM | POA: Insufficient documentation

## 2011-01-27 DIAGNOSIS — Z79899 Other long term (current) drug therapy: Secondary | ICD-10-CM | POA: Insufficient documentation

## 2011-01-27 DIAGNOSIS — I10 Essential (primary) hypertension: Secondary | ICD-10-CM | POA: Insufficient documentation

## 2011-02-17 LAB — DIFFERENTIAL
Basophils Absolute: 0
Basophils Relative: 0
Eosinophils Absolute: 0.3
Lymphs Abs: 3.4
Monocytes Relative: 8
Neutro Abs: 4

## 2011-02-17 LAB — BASIC METABOLIC PANEL
CO2: 21
Chloride: 107
Glucose, Bld: 38 — CL
Potassium: 3.6
Sodium: 140

## 2011-02-17 LAB — URINALYSIS, ROUTINE W REFLEX MICROSCOPIC
Glucose, UA: NEGATIVE
Ketones, ur: NEGATIVE
Specific Gravity, Urine: 1.017
pH: 5.5

## 2011-02-17 LAB — CBC
HCT: 37.6
Hemoglobin: 12.9
MCHC: 34.4
RDW: 16 — ABNORMAL HIGH

## 2011-02-17 LAB — URINE MICROSCOPIC-ADD ON

## 2011-02-17 LAB — URINE CULTURE

## 2011-02-17 LAB — POCT CARDIAC MARKERS: CKMB, poc: 1.4

## 2011-03-10 LAB — URINALYSIS, ROUTINE W REFLEX MICROSCOPIC
Hgb urine dipstick: NEGATIVE
Specific Gravity, Urine: 1.015
Urobilinogen, UA: 0.2

## 2011-03-10 LAB — I-STAT 8, (EC8 V) (CONVERTED LAB)
Acid-base deficit: 5 — ABNORMAL HIGH
BUN: 47 — ABNORMAL HIGH
Chloride: 108
Potassium: 3.8
pCO2, Ven: 42.6 — ABNORMAL LOW
pH, Ven: 7.312 — ABNORMAL HIGH

## 2011-03-10 LAB — DIFFERENTIAL
Eosinophils Relative: 3
Lymphocytes Relative: 31
Lymphs Abs: 2
Monocytes Absolute: 0.5
Monocytes Relative: 8
Neutro Abs: 3.6

## 2011-03-10 LAB — CBC
HCT: 41.5
Hemoglobin: 13.9
Platelets: 128 — ABNORMAL LOW
RBC: 3.37 — ABNORMAL LOW
RBC: 4.11
RDW: 15.3 — ABNORMAL HIGH
WBC: 3.8 — ABNORMAL LOW
WBC: 6.4

## 2011-03-10 LAB — BASIC METABOLIC PANEL
BUN: 20
CO2: 19
Chloride: 108
Chloride: 111
Creatinine, Ser: 1.39 — ABNORMAL HIGH
GFR calc Af Amer: 45 — ABNORMAL LOW
GFR calc Af Amer: 49 — ABNORMAL LOW
GFR calc non Af Amer: 37 — ABNORMAL LOW
Potassium: 4.1
Potassium: 4.2

## 2011-03-10 LAB — HEPATIC FUNCTION PANEL
ALT: 25
AST: 26
Alkaline Phosphatase: 83
Bilirubin, Direct: <0.1
Total Bilirubin: 0.6

## 2011-03-10 LAB — URINE MICROSCOPIC-ADD ON

## 2011-03-10 LAB — URINE CULTURE

## 2011-03-10 LAB — POCT I-STAT CREATININE: Creatinine, Ser: 2.3 — ABNORMAL HIGH

## 2011-04-14 ENCOUNTER — Emergency Department (HOSPITAL_COMMUNITY): Payer: Medicare Other

## 2011-04-14 ENCOUNTER — Emergency Department (HOSPITAL_COMMUNITY)
Admission: EM | Admit: 2011-04-14 | Discharge: 2011-04-14 | Disposition: A | Discharge status: Home / Self Care | Payer: Medicare Other | Attending: Emergency Medicine | Admitting: Emergency Medicine

## 2011-04-14 ENCOUNTER — Encounter (HOSPITAL_COMMUNITY): Payer: Self-pay | Admitting: *Deleted

## 2011-04-14 DIAGNOSIS — R109 Unspecified abdominal pain: Secondary | ICD-10-CM | POA: Insufficient documentation

## 2011-04-14 DIAGNOSIS — R1110 Vomiting, unspecified: Secondary | ICD-10-CM | POA: Insufficient documentation

## 2011-04-14 LAB — COMPREHENSIVE METABOLIC PANEL
ALT: 29 U/L (ref 0–35)
AST: 30 U/L (ref 0–37)
Albumin: 3.3 g/dL — ABNORMAL LOW (ref 3.5–5.2)
Alkaline Phosphatase: 170 U/L — ABNORMAL HIGH (ref 39–117)
BUN: 33 mg/dL — ABNORMAL HIGH (ref 6–23)
CO2: 23 mEq/L (ref 19–32)
Calcium: 9.5 mg/dL (ref 8.4–10.5)
Chloride: 97 mEq/L (ref 96–112)
Creatinine, Ser: 1.33 mg/dL — ABNORMAL HIGH (ref 0.50–1.10)
GFR calc Af Amer: 44 mL/min — ABNORMAL LOW (ref >90)
GFR calc non Af Amer: 38 mL/min — ABNORMAL LOW (ref >90)
Glucose, Bld: 229 mg/dL — ABNORMAL HIGH (ref 70–99)
Potassium: 4.6 mEq/L (ref 3.5–5.1)
Sodium: 136 mEq/L (ref 135–145)
Total Bilirubin: 0.3 mg/dL (ref 0.3–1.2)
Total Protein: 7.8 g/dL (ref 6.0–8.3)

## 2011-04-14 LAB — LIPASE, BLOOD: Lipase: 34 U/L (ref 11–59)

## 2011-04-14 LAB — CBC
HCT: 34.4 % — ABNORMAL LOW (ref 36.0–46.0)
Hemoglobin: 11.2 g/dL — ABNORMAL LOW (ref 12.0–15.0)
MCH: 28.6 pg (ref 26.0–34.0)
MCHC: 32.6 g/dL (ref 30.0–36.0)
MCV: 88 fL (ref 78.0–100.0)
Platelets: 217 10*3/uL (ref 150–400)
RBC: 3.91 MIL/uL (ref 3.87–5.11)
RDW: 17.1 % — ABNORMAL HIGH (ref 11.5–15.5)
WBC: 7.2 10*3/uL (ref 4.0–10.5)

## 2011-04-14 LAB — URINE MICROSCOPIC-ADD ON

## 2011-04-14 LAB — URINALYSIS, ROUTINE W REFLEX MICROSCOPIC
Bilirubin Urine: NEGATIVE
Glucose, UA: >1000 mg/dL — AB
Ketones, ur: NEGATIVE mg/dL
Leukocytes, UA: NEGATIVE
Nitrite: NEGATIVE
Protein, ur: 30 mg/dL — AB
Specific Gravity, Urine: 1.017 (ref 1.005–1.030)
Urobilinogen, UA: 1 mg/dL (ref 0.0–1.0)
pH: 6 (ref 5.0–8.0)

## 2011-04-14 LAB — GLUCOSE, CAPILLARY: Glucose-Capillary: 266 mg/dL — ABNORMAL HIGH (ref 70–99)

## 2011-04-14 MED ORDER — ONDANSETRON 4 MG PO TBDP
8.0000 mg | ORAL_TABLET | Freq: Once | ORAL | Status: AC
  Administered 2011-04-14: 8 mg via ORAL
  Filled 2011-04-14: qty 2

## 2011-04-14 MED ORDER — ONDANSETRON HCL 4 MG/2ML IJ SOLN
4.0000 mg | Freq: Once | INTRAMUSCULAR | Status: DC
  Filled 2011-04-14: qty 2

## 2011-04-14 MED ORDER — ONDANSETRON HCL 4 MG PO TABS: 4.0000 mg | ORAL_TABLET | Freq: Four times a day (QID) | ORAL | Status: AC

## 2011-04-14 MED ORDER — SODIUM CHLORIDE 0.9 % IV BOLUS (SEPSIS): 500.0000 mL | Freq: Once | INTRAVENOUS | Status: DC

## 2011-04-14 NOTE — ED Provider Notes (Signed)
Pt came to ED for vomiting and abdominal pain. Pt is awaiting abdominal CT, unable to obtain IV access by nurse, IV and attending. Therefore will do abd/pel CT w/o contrast. Pt CT is normal, and pt has been pain free for the last two hours. Pt will follow-up with Primary Care provider.  Dorthula Matas, PA 04/14/11 2013

## 2011-04-14 NOTE — ED Notes (Signed)
Just checked pt's CBG and it was 266. Glenda Gonzalez

## 2011-04-14 NOTE — ED Notes (Signed)
Patient transported to CT 

## 2011-04-14 NOTE — ED Notes (Signed)
edp at bedside to perform ultrasound guided iv access

## 2011-04-14 NOTE — ED Notes (Signed)
Awaiting iv team

## 2011-04-14 NOTE — ED Notes (Signed)
edp unsuccessful with iv access attempt

## 2011-04-14 NOTE — ED Notes (Signed)
Returned from ct 

## 2011-04-16 NOTE — ED Provider Notes (Signed)
Medical screening examination/treatment/procedure(s) were performed by non-physician practitioner and as supervising physician I was immediately available for consultation/collaboration.  See attending note.  Raeford Razor, MD 04/16/11 [phone]

## 2011-04-19 ENCOUNTER — Emergency Department (HOSPITAL_COMMUNITY): Payer: Medicare Other

## 2011-04-19 ENCOUNTER — Encounter (HOSPITAL_COMMUNITY): Payer: Self-pay | Admitting: Nurse Practitioner

## 2011-04-19 ENCOUNTER — Emergency Department (HOSPITAL_COMMUNITY)
Admission: EM | Admit: 2011-04-19 | Discharge: 2011-04-19 | Disposition: A | Discharge status: Home / Self Care | Payer: Medicare Other | Attending: Emergency Medicine | Admitting: Emergency Medicine

## 2011-04-19 DIAGNOSIS — R070 Pain in throat: Secondary | ICD-10-CM | POA: Insufficient documentation

## 2011-04-19 DIAGNOSIS — R4702 Dysphasia: Secondary | ICD-10-CM

## 2011-04-19 DIAGNOSIS — R1310 Dysphagia, unspecified: Secondary | ICD-10-CM | POA: Insufficient documentation

## 2011-04-19 DIAGNOSIS — K219 Gastro-esophageal reflux disease without esophagitis: Secondary | ICD-10-CM | POA: Insufficient documentation

## 2011-04-19 DIAGNOSIS — Z79899 Other long term (current) drug therapy: Secondary | ICD-10-CM | POA: Insufficient documentation

## 2011-04-19 DIAGNOSIS — E119 Type 2 diabetes mellitus without complications: Secondary | ICD-10-CM | POA: Insufficient documentation

## 2011-04-19 DIAGNOSIS — I10 Essential (primary) hypertension: Secondary | ICD-10-CM | POA: Insufficient documentation

## 2011-04-19 DIAGNOSIS — I509 Heart failure, unspecified: Secondary | ICD-10-CM | POA: Insufficient documentation

## 2011-04-19 DIAGNOSIS — Z7982 Long term (current) use of aspirin: Secondary | ICD-10-CM | POA: Insufficient documentation

## 2011-04-19 NOTE — ED Provider Notes (Signed)
Medical screening examination/treatment/procedure(s) were performed by non-physician practitioner and as supervising physician I was immediately available for consultation/collaboration.    Birdia Jaycox R Shinichi Anguiano, MD 04/19/11 [id number] 

## 2011-04-19 NOTE — ED Provider Notes (Signed)
History     CSN: [id number] Arrival date & time: 04/19/2011  7:46 PM   First MD Initiated Contact with Patient 04/19/11 2004      Chief Complaint  Patient presents with  . Dysphagia    HPI  History provided by the patient and spouse. Patient presents with complaints of difficulty swallowing and some discomfort in the throat. Symptoms began over the past week. He should states she came to the emergency room on Monday for symptoms of nausea vomiting and diarrhea. Her symptoms of difficulty swallowing began shortly after the symptoms had started on Tuesday and Wednesday. She does not recall having symptoms after eating any specific foods. She has been keeping down fluids but has not been eating any solid foods as these tend to make discomfort worse. At this time her symptoms of nausea vomiting diarrhea have resolved for the past 2 days. She denies any difficulty breathing. She has no chest pain, heart palpitations shortness of breath. Patient denies any headache, weakness in extremities, difficulty speaking, ataxia, facial weakness or drooping.   Past Medical History  Diagnosis Date  . Diabetes mellitus   . CHF (congestive heart failure)   . GERD (gastroesophageal reflux disease)   . Hypertension   . Diverticulitis     History reviewed. No pertinent past surgical history.  History reviewed. No pertinent family history.  History  Substance Use Topics  . Smoking status: Never Smoker   . Smokeless tobacco: Not on file  . Alcohol Use: No    OB History    Grav Para Term Preterm Abortions TAB SAB Ect Mult Living                  Review of Systems  Constitutional: Negative for fever and chills.  HENT: Positive for trouble swallowing. Negative for sore throat, drooling and voice change.   Respiratory: Negative for shortness of breath and stridor.   Cardiovascular: Negative for chest pain and palpitations.  Gastrointestinal: Negative for abdominal pain.  Neurological: Negative  for speech difficulty, weakness, numbness and headaches.  All other systems reviewed and are negative.    Allergies  Codeine; Morphine; and Penicillins  Home Medications   Current Outpatient Rx  Name Route Sig Dispense Refill  . ASPIRIN 81 MG PO TABS Oral Take 81 mg by mouth every evening.      Marland Kitchen BIMATOPROST 0.03 % OP SOLN Both Eyes Place 1 drop into both eyes daily.      . CYCLOBENZAPRINE HCL 10 MG PO TABS Oral Take 10 mg by mouth 3 (three) times daily as needed. For muscle spasms     . DICLOFENAC-MISOPROSTOL 75-0.2 MG PO TABS Oral Take 1 tablet by mouth 3 (three) times daily.     . DONEPEZIL HCL 10 MG PO TABS Oral Take 10 mg by mouth daily.      Marland Kitchen ESOMEPRAZOLE MAGNESIUM 40 MG PO CPDR Oral Take 40 mg by mouth daily before breakfast.      . FUROSEMIDE 40 MG PO TABS  TAKE ONE TABLET BY MOUTH TWICE DAILY 60 tablet 5  . GLYBURIDE 5 MG PO TABS Oral Take 5 mg by mouth daily.      Marland Kitchen ONDANSETRON HCL 4 MG PO TABS Oral Take 1 tablet (4 mg total) by mouth every 6 (six) hours. 6 tablet 0  . PREGABALIN 50 MG PO CAPS Oral Take 50 mg by mouth 3 (three) times daily.      . TRAMADOL HCL 50 MG PO TABS Oral Take  50 mg by mouth every 6 (six) hours as needed. For pain. Maximum dose= 8 tablets per day       BP 164/84  Pulse 90  Temp 97.6 F (36.4 C)  Resp 15  Ht 5\' 1"  (1.549 m)  Wt 199 lb (90.266 kg)  BMI 37.60 kg/m2  SpO2 96%  Physical Exam  Nursing note and vitals reviewed. Constitutional: She is oriented to person, place, and time. She appears well-developed and well-nourished. No distress.  HENT:  Head: Normocephalic.  Mouth/Throat: Oropharynx is clear and moist.  Neck: Normal range of motion. Neck supple. No tracheal deviation present.       No mass or deformity. Trachea midline.  Cardiovascular: Normal rate and regular rhythm.   No murmur heard. Pulmonary/Chest: Effort normal. No stridor. She has no wheezes. She has no rales.  Abdominal: Soft. Bowel sounds are normal. There is no  tenderness. There is no guarding.  Neurological: She is alert and oriented to person, place, and time. She has normal strength. No cranial nerve deficit or sensory deficit. Coordination and gait normal.       Normal Nonfocal neuro exam  Skin: Skin is warm. No rash noted.  Psychiatric: She has a normal mood and affect. Her behavior is normal.    ED Course  Procedures (including critical care time)  Dg Neck Soft Tissue  04/19/2011  *RADIOLOGY REPORT*  Clinical Data: Difficulty swallowing.  NECK SOFT TISSUES - 1+ VIEW  Comparison: CT of the cervical spine performed 06/03/2005  Findings: The epiglottis is normal in thickness.  The aryepiglottic folds are unremarkable in appearance.  The oropharynx, nasopharynx and hypopharynx are within normal limits.  The proximal trachea is normal in caliber.  Prevertebral soft tissues are normal in appearance.  There is complete absence of the dentition.  The visualized paranasal sinuses and mastoid air cells are well-aerated.  Mild degenerative change is noted along the cervical spine, with anterior disc osteophyte complexes.  There is no evidence of subluxation or significant disc space narrowing along the visualized cervical spine.  IMPRESSION: No significant soft tissue abnormalities seen with respect to the neck.  Original Report Authenticated By: Tonia Ghent, M.D.   Dg Chest 2 View  04/19/2011  *RADIOLOGY REPORT*  Clinical Data: Shortness of breath, weakness, difficulty swallowing  CHEST - 2 VIEW  Comparison: 10/11/2010  Findings: Borderline enlargement of cardiac silhouette. Normal pulmonary vascularity. Calcified mildly tortuous thoracic aorta. Small hiatal hernia. Peribronchial thickening and slight accentuation perihilar markings on the left appear grossly unchanged. No definite infiltrate, pleural effusion or pneumothorax. Bones demineralized.  IMPRESSION: Small hiatal hernia. Chronic bronchitic changes. No acute abnormalities.  Original Report  Authenticated By: Lollie Marrow, M.D.     1. Dysphasia      MDM   8:20 PM patient seen and evaluated. Patient no acute distress. Patient swallowing fluids and holding secretions. Patient with no stridor or tripoding.  Patient discussed with attending physician. They agreed with diagnosis and treatment plan. Plan will be to give close GI followup. Patient instructed on concerning symptoms that should prompt immediate return to the emergency room. She agrees with plan.     Angus Seller, [place] 04/19/11 [phone]

## 2011-04-19 NOTE — ED Notes (Signed)
States here 3 days ago for same complaint. Reports every time she swallows the food gets stuck in her throat then "comes back up." No n/v/d/pain. States "im hungry but I can't eat." reports acid reflux

## 2011-04-21 ENCOUNTER — Encounter: Payer: Self-pay | Admitting: Gastroenterology

## 2011-05-06 ENCOUNTER — Ambulatory Visit (INDEPENDENT_AMBULATORY_CARE_PROVIDER_SITE_OTHER): Payer: Medicare Other | Admitting: Gastroenterology

## 2011-05-06 ENCOUNTER — Encounter: Payer: Self-pay | Admitting: Gastroenterology

## 2011-05-06 DIAGNOSIS — R1310 Dysphagia, unspecified: Secondary | ICD-10-CM

## 2011-05-06 NOTE — Progress Notes (Signed)
HPI: This is a  pleasant 77 year old woman who is here with her husband today.  Two weeks of liquid diet only.  When she eats, food hangs in upper throat, upper esophagus.  She will try swallowing several times, drink water and that often helps.  She did not have this problem 3 weeks ago.    She went to ER for vomiting, diarrhea (acute illness that lasted about 2 hours) then improved.  That was 2 weeks ago.  Since then, signficant swallowing problems.  She has pyrosis periodically.    She tries to eat peaches.  Does not wear her dentures (ever).  Liquids, mashed potatoes go down find. Potato salad caused problems.  No antibiotics recently.   For the past 2 weeks she's had worse DOE.  Intermittent, but seldom retrosternal CP.    Review of systems: Pertinent positive and negative review of systems were noted in the above HPI section. Complete review of systems was performed and was otherwise normal.    Past Medical History  Diagnosis Date  . Diabetes mellitus   . CHF (congestive heart failure)   . GERD (gastroesophageal reflux disease)   . Hypertension   . Diverticulitis   . Gout   . Essential and other specified forms of tremor   . Dementia   . CAD (coronary artery disease)     Past Surgical History  Procedure Date  . Dilation and curettage of uterus   . Tumor removal     Current Outpatient Prescriptions  Medication Sig Dispense Refill  . aspirin 81 MG tablet Take 81 mg by mouth every evening.        . bimatoprost (LUMIGAN) 0.03 % ophthalmic solution Place 1 drop into both eyes daily.        . cloNIDine (CATAPRES) 0.2 MG tablet Take 0.2 mg by mouth 2 (two) times daily.        . cyclobenzaprine (FLEXERIL) 10 MG tablet Take 10 mg by mouth 3 (three) times daily as needed. For muscle spasms       . diclofenac-misoprostol (ARTHROTEC 75) 75-0.2 MG per tablet Take 1 tablet by mouth 3 (three) times daily.       Marland Kitchen donepezil (ARICEPT) 10 MG tablet Take 10 mg by mouth daily.          . furosemide (LASIX) 40 MG tablet TAKE ONE TABLET BY MOUTH TWICE DAILY  60 tablet  5  . glyBURIDE (DIABETA) 5 MG tablet Take 10 mg by mouth 2 (two) times daily with a meal.       . traMADol (ULTRAM) 50 MG tablet Take 50 mg by mouth every 6 (six) hours as needed. For pain. Maximum dose= 8 tablets per day       . esomeprazole (NEXIUM) 40 MG capsule Take 40 mg by mouth daily before breakfast.        . pregabalin (LYRICA) 50 MG capsule Take 50 mg by mouth 3 (three) times daily.          Allergies as of 05/06/2011 - Review Complete 05/06/2011  Allergen Reaction Noted  . Codeine Other (See Comments)   . Morphine Itching   . Penicillins Itching     Family History  Problem Relation Age of Onset  . Colon cancer Neg Hx     History   Social History  . Marital Status: Married    Spouse Name: N/A    Number of Children: N/A  . Years of Education: N/A   Occupational History  .  Not on file.   Social History Main Topics  . Smoking status: Never Smoker   . Smokeless tobacco: Never Used  . Alcohol Use: No  . Drug Use: No  . Sexually Active: Not on file   Other Topics Concern  . Not on file   Social History Narrative  . No narrative on file       Physical Exam: BP 142/76  Pulse 68  Ht 5\' 3"  (1.6 m)  Wt 204 lb (92.534 kg)  BMI 36.14 kg/m2 Constitutional: generally well-appearing Psychiatric: alert and oriented x3 Eyes: extraocular movements intact Mouth: oral pharynx moist, no lesions Neck: supple no lymphadenopathy Cardiovascular: heart regular rate and rhythm Lungs: clear to auscultation bilaterally Abdomen: soft, nontender, nondistended, no obvious ascites, no peritoneal signs, normal bowel sounds Extremities: no lower extremity edema bilaterally Skin: no lesions on visible extremities    Assessment and plan: 77 y.o. female with  solid food dysphasia  She did not wear her dentures when she eats ever. I explained that this could be a big reason why she is having  trouble eating solid foods I recommended she begin using her dentures.  We will arrange for a barium esophagram and will decide after that she requires EGD for further evaluation of her swallowing issue.

## 2011-05-06 NOTE — Patient Instructions (Addendum)
Barium esophagram.  [facility] please arrive at 1045 am on 05/09/11.  Nothing to eat or drink after midnight. Pending those results, you may need an EGD. You should wear your dentures if you are eating solid food.

## 2011-05-09 ENCOUNTER — Ambulatory Visit (HOSPITAL_COMMUNITY)
Admission: RE | Admit: 2011-05-09 | Discharge: 2011-05-09 | Disposition: A | Discharge status: Home / Self Care | Payer: Medicare Other | Source: Ambulatory Visit | Attending: Gastroenterology | Admitting: Gastroenterology

## 2011-05-09 DIAGNOSIS — K224 Dyskinesia of esophagus: Secondary | ICD-10-CM | POA: Insufficient documentation

## 2011-05-09 DIAGNOSIS — R1310 Dysphagia, unspecified: Secondary | ICD-10-CM | POA: Insufficient documentation

## 2011-05-09 DIAGNOSIS — K229 Disease of esophagus, unspecified: Secondary | ICD-10-CM | POA: Insufficient documentation

## 2011-05-09 DIAGNOSIS — K449 Diaphragmatic hernia without obstruction or gangrene: Secondary | ICD-10-CM | POA: Insufficient documentation

## 2011-05-15 ENCOUNTER — Encounter: Payer: Self-pay | Admitting: Gastroenterology

## 2011-05-30 ENCOUNTER — Emergency Department (HOSPITAL_COMMUNITY): Payer: Medicare Other

## 2011-05-30 ENCOUNTER — Emergency Department (HOSPITAL_COMMUNITY)
Admission: EM | Admit: 2011-05-30 | Discharge: 2011-05-31 | Disposition: A | Discharge status: Home / Self Care | Payer: Medicare Other | Attending: Emergency Medicine | Admitting: Emergency Medicine

## 2011-05-30 ENCOUNTER — Encounter (HOSPITAL_COMMUNITY): Payer: Self-pay | Admitting: Emergency Medicine

## 2011-05-30 ENCOUNTER — Other Ambulatory Visit: Payer: Self-pay

## 2011-05-30 DIAGNOSIS — I10 Essential (primary) hypertension: Secondary | ICD-10-CM | POA: Insufficient documentation

## 2011-05-30 DIAGNOSIS — R109 Unspecified abdominal pain: Secondary | ICD-10-CM | POA: Insufficient documentation

## 2011-05-30 DIAGNOSIS — R079 Chest pain, unspecified: Secondary | ICD-10-CM | POA: Insufficient documentation

## 2011-05-30 DIAGNOSIS — E119 Type 2 diabetes mellitus without complications: Secondary | ICD-10-CM | POA: Insufficient documentation

## 2011-05-30 DIAGNOSIS — F0390 Unspecified dementia without behavioral disturbance: Secondary | ICD-10-CM | POA: Insufficient documentation

## 2011-05-30 DIAGNOSIS — I509 Heart failure, unspecified: Secondary | ICD-10-CM | POA: Insufficient documentation

## 2011-05-30 DIAGNOSIS — K219 Gastro-esophageal reflux disease without esophagitis: Secondary | ICD-10-CM | POA: Insufficient documentation

## 2011-05-30 DIAGNOSIS — I2510 Atherosclerotic heart disease of native coronary artery without angina pectoris: Secondary | ICD-10-CM | POA: Insufficient documentation

## 2011-05-30 DIAGNOSIS — R112 Nausea with vomiting, unspecified: Secondary | ICD-10-CM | POA: Insufficient documentation

## 2011-05-30 LAB — CBC
MCH: 28.4 pg (ref 26.0–34.0)
MCV: 87.3 fL (ref 78.0–100.0)
Platelets: 267 10*3/uL (ref 150–400)
RBC: 4.16 MIL/uL (ref 3.87–5.11)

## 2011-05-30 LAB — BASIC METABOLIC PANEL
BUN: 32 mg/dL — ABNORMAL HIGH (ref 6–23)
CO2: 25 mEq/L (ref 19–32)
Calcium: 9.5 mg/dL (ref 8.4–10.5)
Creatinine, Ser: 1.33 mg/dL — ABNORMAL HIGH (ref 0.50–1.10)
Glucose, Bld: 348 mg/dL — ABNORMAL HIGH (ref 70–99)

## 2011-05-30 LAB — PROTIME-INR
INR: 1.1 (ref 0.00–1.49)
Prothrombin Time: 14.4 seconds (ref 11.6–15.2)

## 2011-05-30 MED ORDER — INSULIN REGULAR HUMAN 100 UNIT/ML IJ SOLN: 8.0000 [IU] | Freq: Once | INTRAMUSCULAR | Status: DC

## 2011-05-30 MED ORDER — INSULIN ASPART 100 UNIT/ML ~~LOC~~ SOLN
8.0000 [IU] | Freq: Once | SUBCUTANEOUS | Status: AC
  Administered 2011-05-30: 8 [IU] via SUBCUTANEOUS

## 2011-05-30 NOTE — ED Notes (Signed)
Verbal order from P.A. Received to p.o. challenge patient.

## 2011-05-30 NOTE — ED Notes (Signed)
Family at bedside. 

## 2011-05-30 NOTE — ED Provider Notes (Signed)
History     CSN: [id number]  Arrival date & time 05/30/11  1927   First MD Initiated Contact with Patient 05/30/11 2232      Chief Complaint  Patient presents with  . Chest Pain    (Consider location/radiation/quality/duration/timing/severity/associated sxs/prior treatment) Patient is a 78 y.o. female presenting with chest pain. The history is provided by the patient.  Chest Pain The chest pain began 6 - 12 hours ago. Chest pain occurs constantly. The chest pain is worsening. The pain is associated with eating. At its most intense, the pain is at 8/10. The pain is currently at 2/10. The severity of the pain is moderate. Chest pain is worsened by eating. Primary symptoms include abdominal pain, nausea and vomiting. Pertinent negatives for primary symptoms include no fever, no shortness of breath, no cough, no palpitations and no dizziness.   Pt states she has had problems with eating and reflux for 2 months now. States has appointment on 1/22 for dilation of esophagus. States that always having pain that starts in her upper stomach and radiates to chest. Today states pain worsened, and she has had nausea and vomiting. Also admits that her doctor put her on liquid diet, but today she at chicken and mac and cheese and was unable to keep it down. States currently her pain is all gone. Denies SOB, diaphoresis, back pain, urinary symptoms. No other complaints.   Past Medical History  Diagnosis Date  . Diabetes mellitus   . CHF (congestive heart failure)   . GERD (gastroesophageal reflux disease)   . Hypertension   . Diverticulitis   . Gout   . Essential and other specified forms of tremor   . Dementia   . CAD (coronary artery disease)     Past Surgical History  Procedure Date  . Dilation and curettage of uterus   . Tumor removal     Family History  Problem Relation Age of Onset  . Colon cancer Neg Hx     History  Substance Use Topics  . Smoking status: Never Smoker   .  Smokeless tobacco: Never Used  . Alcohol Use: No    OB History    Grav Para Term Preterm Abortions TAB SAB Ect Mult Living                  Review of Systems  Constitutional: Negative for fever and chills.  HENT: Negative.   Eyes: Negative.   Respiratory: Negative for cough and shortness of breath.   Cardiovascular: Positive for chest pain. Negative for palpitations.  Gastrointestinal: Positive for nausea, vomiting and abdominal pain.  Genitourinary: Negative.   Musculoskeletal: Negative.   Skin: Negative.   Neurological: Negative for dizziness.  Psychiatric/Behavioral: Negative.     Allergies  Codeine; Morphine; and Penicillins  Home Medications   Current Outpatient Rx  Name Route Sig Dispense Refill  . ASPIRIN 81 MG PO TABS Oral Take 81 mg by mouth every evening.      Marland Kitchen BIMATOPROST 0.03 % OP SOLN Both Eyes Place 1 drop into both eyes daily.      Marland Kitchen CLONIDINE HCL 0.2 MG PO TABS Oral Take 0.2 mg by mouth 2 (two) times daily.      . CYCLOBENZAPRINE HCL 10 MG PO TABS Oral Take 10 mg by mouth 3 (three) times daily as needed. For muscle spasms     . DICLOFENAC-MISOPROSTOL 75-200 MG-MCG PO TABS Oral Take 1 tablet by mouth 3 (three) times daily.     Marland Kitchen  DONEPEZIL HCL 10 MG PO TABS Oral Take 10 mg by mouth daily.      Marland Kitchen ESOMEPRAZOLE MAGNESIUM 40 MG PO CPDR Oral Take 40 mg by mouth daily before breakfast.      . FUROSEMIDE 40 MG PO TABS Oral Take 40 mg by mouth 2 (two) times daily.      . GLYBURIDE 5 MG PO TABS Oral Take 10 mg by mouth 2 (two) times daily with a meal.     . PREGABALIN 50 MG PO CAPS Oral Take 50 mg by mouth 3 (three) times daily.      . TRAMADOL HCL 50 MG PO TABS Oral Take 50 mg by mouth every 6 (six) hours as needed. For pain. Maximum dose= 8 tablets per day       BP 121/84  Pulse 97  Temp(Src) 98 F (36.7 C) (Oral)  Resp 16  Ht 5' 1.5" (1.562 m)  SpO2 99%  Physical Exam  Nursing note and vitals reviewed. Constitutional: She is oriented to person, place,  and time. She appears well-developed and well-nourished. No distress.  HENT:  Head: Normocephalic.  Eyes: Conjunctivae are normal.  Neck: Neck supple.  Cardiovascular: Normal rate, regular rhythm and normal heart sounds.   Pulmonary/Chest: Effort normal and breath sounds normal. No respiratory distress. She has no wheezes.  Abdominal: Soft. Bowel sounds are normal.       Epigastric tenderness, no guarding, abdomen soft  Musculoskeletal: Normal range of motion. She exhibits no edema.  Neurological: She is alert and oriented to person, place, and time.  Skin: Skin is warm.    ED Course  Procedures (including critical care time)  Labs Reviewed  CBC - Abnormal; Notable for the following:    Hemoglobin 11.8 (*)    RDW 17.5 (*)    All other components within normal limits  BASIC METABOLIC PANEL - Abnormal; Notable for the following:    Sodium 134 (*)    Chloride 93 (*)    Glucose, Bld 348 (*)    BUN 32 (*)    Creatinine, Ser 1.33 (*)    GFR calc non Af Amer 38 (*)    GFR calc Af Amer 44 (*)    All other components within normal limits  PROTIME-INR  POCT I-STAT TROPONIN I  I-STAT TROPONIN I   Dg Chest 2 View  05/30/2011  *RADIOLOGY REPORT*  Clinical Data: Chest pain  CHEST - 2 VIEW  Comparison: 04/19/2011  Findings: Chronic interstitial markings.  No pleural effusion or pneumothorax.  Cardiomediastinal silhouette is within normal limits.  Mild degenerative changes of the visualized thoracolumbar spine.  IMPRESSION: No evidence of acute cardiopulmonary disease.  Original Report Authenticated By: Charline Bills, M.D.    Date: 05/31/2011  Rate: 96  Rhythm: normal sinus rhythm  QRS Axis: normal  Intervals: QT prolonged  ST/T Wave abnormalities: normal  Conduction Disutrbances:none  Narrative Interpretation:   Old EKG Reviewed: unchanged   Pt is currenlty symptom free. Hyperglycemic, will try insuling. Will do PO challenge. Will monitor.    1:03 AM pt tolerated water. Pain  free. Wanting to go home. Waiting for lipase and Hepatic function panel. CBG recheck 251.   MDM         Lottie Mussel, PA 05/31/11 [phone]

## 2011-05-30 NOTE — ED Notes (Signed)
Lipase and hepatic function blood tubes to be collected by phlebotomist (if necessary) or to be run as an add on (if possible).

## 2011-05-30 NOTE — ED Notes (Signed)
Pt st's she was seen approx 2 weeks ago for chest pain st's she is scheduled for dilatation of her esphogus on 1/22.  Pt st's everytime she tries to eat anything she starts hurting, can't wait for appt.

## 2011-05-30 NOTE — ED Notes (Signed)
Patient requested "something to help her sleep."

## 2011-05-31 LAB — LIPASE, BLOOD: Lipase: 20 U/L (ref 11–59)

## 2011-05-31 LAB — HEPATIC FUNCTION PANEL
ALT: 18 U/L (ref 0–35)
AST: 21 U/L (ref 0–37)
Alkaline Phosphatase: 160 U/L — ABNORMAL HIGH (ref 39–117)
Bilirubin, Direct: 0.1 mg/dL (ref 0.0–0.3)
Indirect Bilirubin: 0.2 mg/dL — ABNORMAL LOW (ref 0.3–0.9)

## 2011-05-31 LAB — GLUCOSE, CAPILLARY

## 2011-05-31 NOTE — ED Provider Notes (Signed)
Medical screening examination/treatment/procedure(s) were performed by non-physician practitioner and as supervising physician I was immediately available for consultation/collaboration.  Nissi Doffing R. Brayon Bielefeld, MD 05/31/11 0743 

## 2011-05-31 NOTE — ED Notes (Signed)
Family at bedside. 

## 2011-05-31 NOTE — ED Notes (Signed)
The patient was able to tolerate p.o. fluids.

## 2011-06-12 ENCOUNTER — Ambulatory Visit (AMBULATORY_SURGERY_CENTER): Payer: Medicare Other | Admitting: *Deleted

## 2011-06-12 VITALS — Ht 61.5 in | Wt 204.0 lb

## 2011-06-12 DIAGNOSIS — R1310 Dysphagia, unspecified: Secondary | ICD-10-CM

## 2011-06-17 ENCOUNTER — Encounter: Payer: Self-pay | Admitting: Gastroenterology

## 2011-06-17 ENCOUNTER — Ambulatory Visit (AMBULATORY_SURGERY_CENTER): Payer: Medicare Other | Admitting: Gastroenterology

## 2011-06-17 VITALS — BP 139/52 | HR 84 | Temp 95.6°F | Resp 14 | Ht 61.0 in | Wt 204.0 lb

## 2011-06-17 DIAGNOSIS — R1310 Dysphagia, unspecified: Secondary | ICD-10-CM

## 2011-06-17 DIAGNOSIS — K2990 Gastroduodenitis, unspecified, without bleeding: Secondary | ICD-10-CM

## 2011-06-17 DIAGNOSIS — K2970 Gastritis, unspecified, without bleeding: Secondary | ICD-10-CM

## 2011-06-17 DIAGNOSIS — K219 Gastro-esophageal reflux disease without esophagitis: Secondary | ICD-10-CM

## 2011-06-17 MED ORDER — ESOMEPRAZOLE MAGNESIUM 40 MG PO CPDR: 40.0000 mg | DELAYED_RELEASE_CAPSULE | Freq: Two times a day (BID) | ORAL | Status: DC

## 2011-06-17 MED ORDER — SODIUM CHLORIDE 0.9 % IV SOLN: 500.0000 mL | INTRAVENOUS | Status: DC

## 2011-06-17 NOTE — Progress Notes (Signed)
Patient did not have preoperative order for IV antibiotic SSI prophylaxis. (G8918)  Patient did not experience any of the following events: a burn prior to discharge; a fall within the facility; wrong site/side/patient/procedure/implant event; or a hospital transfer or hospital admission upon discharge from the facility. (G8907)  

## 2011-06-17 NOTE — Op Note (Signed)
[facility] [address] [place], [place]  [address]  ENDOSCOPY PROCEDURE REPORT  PATIENT:  Glenda, Gonzalez  MR#:  [id number] BIRTHDATE:  22-Nov-1933, 77 yrs. old  GENDER:  female ENDOSCOPIST:  Rachael Fee, MD Referred by:  Billee Cashing, M.D. PROCEDURE DATE:  06/17/2011 PROCEDURE:  EGD with biopsy, 43239 ASA CLASS:  Class III INDICATIONS:  dysphagia, focal stricture on barium esophagram MEDICATIONS:    Fentanyl 25 mcg IV, These medications were titrated to patient response per physician's verbal order, Versed 3 mg IV TOPICAL ANESTHETIC:  Cetacaine Spray  DESCRIPTION OF PROCEDURE:   After the risks benefits and alternatives of the procedure were thoroughly explained, informed consent was obtained.  The  endoscope was introduced through the mouth and advanced to the second portion of the duodenum, without limitations.  The instrument was slowly withdrawn as the mucosa was fully examined. <<PROCEDUREIMAGES>> There was moderate, pan-gastritis. Biopsied and sent to pathology (jar 1) (see image4).  A hiatal hernia was found. This was 3-4cm.There was severe, ulcerative distal esophagitis, with 3 2-3cm long spokes of ulcerated mucosa at GE junction. This lumen through the GE junction was narrowed. Biopsies taken and sent to pathology (see image6).  Otherwise the examination was normal (see image1, image2, and image3).    Retroflexed views revealed no abnormalities.    The scope was then withdrawn from the patient and the procedure completed. COMPLICATIONS:  None  ENDOSCOPIC IMPRESSION: 1) Moderate gastritis, biopsied to check for H. pylori 2) Hiatal hernia 3) Severe ulcerative esophagitis, biopsied to check for neoplasm (I doubt) 4) Otherwise normal examination  RECOMMENDATIONS: Will increase nexium to twice daily (best taken 20-30 min before breakfast and dinner meals). You should always wear dentures when eating. Await pathology  results.  ______________________________ Rachael Fee, MD  n. eSIGNED:   Rachael Fee at 06/17/2011 11:59 AM  Filbert Berthold, [phone]

## 2011-06-17 NOTE — Patient Instructions (Signed)
You have moderate gastritis and severe ulcerative esophagitis.   We did biopsy the area to rule out bacteria.  The doctor wants you to take your nexium twice daily 1/2 hr. Before breakfast and dinner.   He also wants you to wear your dentures at all meals.    Your pathology report will be mailed to your home within two weeks.   If you have any questions, please call us at [phone]. Thank-you.

## 2011-06-22 ENCOUNTER — Emergency Department (HOSPITAL_COMMUNITY)
Admission: EM | Admit: 2011-06-22 | Discharge: 2011-06-22 | Disposition: A | Discharge status: Home / Self Care | Payer: Medicare Other | Attending: Emergency Medicine | Admitting: Emergency Medicine

## 2011-06-22 ENCOUNTER — Emergency Department (HOSPITAL_COMMUNITY): Payer: Medicare Other

## 2011-06-22 ENCOUNTER — Encounter (HOSPITAL_COMMUNITY): Payer: Self-pay | Admitting: Emergency Medicine

## 2011-06-22 DIAGNOSIS — Z7982 Long term (current) use of aspirin: Secondary | ICD-10-CM | POA: Insufficient documentation

## 2011-06-22 DIAGNOSIS — I2510 Atherosclerotic heart disease of native coronary artery without angina pectoris: Secondary | ICD-10-CM | POA: Insufficient documentation

## 2011-06-22 DIAGNOSIS — W19XXXA Unspecified fall, initial encounter: Secondary | ICD-10-CM

## 2011-06-22 DIAGNOSIS — Z79899 Other long term (current) drug therapy: Secondary | ICD-10-CM | POA: Insufficient documentation

## 2011-06-22 DIAGNOSIS — S8001XA Contusion of right knee, initial encounter: Secondary | ICD-10-CM

## 2011-06-22 DIAGNOSIS — Z8639 Personal history of other endocrine, nutritional and metabolic disease: Secondary | ICD-10-CM | POA: Insufficient documentation

## 2011-06-22 DIAGNOSIS — S8391XA Sprain of unspecified site of right knee, initial encounter: Secondary | ICD-10-CM

## 2011-06-22 DIAGNOSIS — I509 Heart failure, unspecified: Secondary | ICD-10-CM | POA: Insufficient documentation

## 2011-06-22 DIAGNOSIS — M25469 Effusion, unspecified knee: Secondary | ICD-10-CM | POA: Insufficient documentation

## 2011-06-22 DIAGNOSIS — K219 Gastro-esophageal reflux disease without esophagitis: Secondary | ICD-10-CM | POA: Insufficient documentation

## 2011-06-22 DIAGNOSIS — S8000XA Contusion of unspecified knee, initial encounter: Secondary | ICD-10-CM | POA: Insufficient documentation

## 2011-06-22 DIAGNOSIS — E119 Type 2 diabetes mellitus without complications: Secondary | ICD-10-CM | POA: Insufficient documentation

## 2011-06-22 DIAGNOSIS — I10 Essential (primary) hypertension: Secondary | ICD-10-CM | POA: Insufficient documentation

## 2011-06-22 DIAGNOSIS — M25569 Pain in unspecified knee: Secondary | ICD-10-CM | POA: Insufficient documentation

## 2011-06-22 DIAGNOSIS — F068 Other specified mental disorders due to known physiological condition: Secondary | ICD-10-CM | POA: Insufficient documentation

## 2011-06-22 DIAGNOSIS — W010XXA Fall on same level from slipping, tripping and stumbling without subsequent striking against object, initial encounter: Secondary | ICD-10-CM | POA: Insufficient documentation

## 2011-06-22 DIAGNOSIS — IMO0002 Reserved for concepts with insufficient information to code with codable children: Secondary | ICD-10-CM | POA: Insufficient documentation

## 2011-06-22 DIAGNOSIS — M79609 Pain in unspecified limb: Secondary | ICD-10-CM | POA: Insufficient documentation

## 2011-06-22 DIAGNOSIS — Z862 Personal history of diseases of the blood and blood-forming organs and certain disorders involving the immune mechanism: Secondary | ICD-10-CM | POA: Insufficient documentation

## 2011-06-22 MED ORDER — HYDROCODONE-ACETAMINOPHEN 5-325 MG PO TABS
1.0000 | ORAL_TABLET | Freq: Once | ORAL | Status: AC
  Administered 2011-06-22: 1 via ORAL
  Filled 2011-06-22: qty 1

## 2011-06-22 NOTE — ED Notes (Signed)
Pt reports outside about to get into vehicle when she slipped and her legs came from under her. Pt c/o right leg pain. Pt reports hitting head but no +LOC.

## 2011-06-22 NOTE — ED Provider Notes (Signed)
History     CSN: [id number]  Arrival date & time 06/22/11  1435   First MD Initiated Contact with Patient 06/22/11 1523      Chief Complaint  Patient presents with  . Fall  . Leg Pain    (Consider location/radiation/quality/duration/timing/severity/associated sxs/prior treatment) Patient is a 78 y.o. female presenting with fall and leg pain. The history is provided by the patient.  Fall Pertinent negatives include no fever, no numbness, no abdominal pain and no headaches.  Leg Pain  Pertinent negatives include no numbness.  pt c/o right knee and lower leg pain s/p slip and fall on ice 3 days ago. Constant, dull, non radiating pain to right knee, worse w palpation, movement knee, standing. No hip pain. No ankle pain. Denies loc or headache. No neck or back pain. No coumadin use. Denies numbness/weakness. Skin intact. Denies other pain or injury.   Past Medical History  Diagnosis Date  . Diabetes mellitus   . CHF (congestive heart failure)   . GERD (gastroesophageal reflux disease)   . Hypertension   . Diverticulitis   . Gout   . Essential and other specified forms of tremor   . Dementia   . CAD (coronary artery disease)     Past Surgical History  Procedure Date  . Dilation and curettage of uterus   . Tumor removal     Family History  Problem Relation Age of Onset  . Colon cancer Neg Hx   . Heart disease Father   . Heart disease Brother     History  Substance Use Topics  . Smoking status: Never Smoker   . Smokeless tobacco: Never Used  . Alcohol Use: No    OB History    Grav Para Term Preterm Abortions TAB SAB Ect Mult Living                  Review of Systems  Constitutional: Negative for fever and chills.  HENT: Negative for neck pain.   Eyes: Negative for redness.  Respiratory: Negative for shortness of breath.   Cardiovascular: Negative for chest pain.  Gastrointestinal: Negative for abdominal pain.  Genitourinary: Negative for flank pain.    Musculoskeletal: Negative for back pain.  Skin: Negative for wound.  Neurological: Negative for weakness, numbness and headaches.  Hematological: Does not bruise/bleed easily.  Psychiatric/Behavioral: Negative for confusion.    Allergies  Codeine; Morphine; and Penicillins  Home Medications   Current Outpatient Rx  Name Route Sig Dispense Refill  . ASPIRIN EC 81 MG PO TBEC Oral Take 81 mg by mouth daily.    Marland Kitchen BIMATOPROST 0.03 % OP SOLN Both Eyes Place 1 drop into both eyes daily.     Marland Kitchen CLONIDINE HCL 0.2 MG PO TABS Oral Take 0.2 mg by mouth 3 (three) times daily.     . CYCLOBENZAPRINE HCL 10 MG PO TABS Oral Take 10 mg by mouth 3 (three) times daily as needed. For muscle spasms    . DICLOFENAC-MISOPROSTOL 75-200 MG-MCG PO TABS Oral Take 1 tablet by mouth 3 (three) times daily.     . DONEPEZIL HCL 10 MG PO TABS Oral Take 10 mg by mouth daily.     Marland Kitchen ESOMEPRAZOLE MAGNESIUM 40 MG PO CPDR Oral Take 1 capsule (40 mg total) by mouth 2 (two) times daily before a meal. 60 capsule 11  . FUROSEMIDE 40 MG PO TABS Oral Take 40 mg by mouth 2 (two) times daily.      . GLYBURIDE 5  MG PO TABS Oral Take 10 mg by mouth 2 (two) times daily with a meal.     . PREGABALIN 50 MG PO CAPS Oral Take 50 mg by mouth 3 (three) times daily.      . TRAMADOL HCL 50 MG PO TABS Oral Take 50 mg by mouth every 6 (six) hours as needed. For pain. Maximum dose= 8 tablets per day       BP 117/81  Pulse 105  Temp(Src) 98.2 F (36.8 C) (Oral)  Resp 20  SpO2 100%  Physical Exam  Nursing note and vitals reviewed. Constitutional: She is oriented to person, place, and time. She appears well-developed and well-nourished. No distress.  HENT:  Head: Atraumatic.  Eyes: Conjunctivae are normal. Pupils are equal, round, and reactive to light. No scleral icterus.  Neck: Neck supple. No tracheal deviation present.  Cardiovascular: Normal rate.   Pulmonary/Chest: Effort normal. No respiratory distress.  Abdominal: Soft. Normal  appearance. She exhibits no distension. There is no tenderness.  Musculoskeletal:       Mild-mod swelling and tenderness right knee anteriorly, also prox right tib/fib tenderness. No gross ligament laxity appreciated. Distal pulses palp. Limited rom right knee due to pain and swelling. Good rom at ankle and hip without pain. No other focal bony tenderness noted.   Neurological: She is alert and oriented to person, place, and time.       Motor intact bil.   Skin: Skin is warm and dry. No rash noted.  Psychiatric: She has a normal mood and affect.    ED Course  Procedures (including critical care time)    Dg Tibia/fibula Right  06/22/2011  *RADIOLOGY REPORT*  Clinical Data:  Fall, lower extremity pain  RIGHT TIBIA AND FIBULA - 2 VIEW  Comparison: None.  Findings: No evidence of fracture or dislocation of the tibia or fibula.  The ankle joint appears normal.  IMPRESSION: No evidence of fracture.  Original Report Authenticated By: Genevive Bi, M.D.   Dg Knee Complete 4 Views Right  06/22/2011  *RADIOLOGY REPORT*  Clinical Data: Fall, right knee pain  RIGHT KNEE - COMPLETE 4+ VIEW  Comparison: Plain film 10/13/2010  Findings: No evidence of fracture or dislocation of the right knee. There is a suprapatellar joint effusion which is similar to prior.  IMPRESSION:  1.  No evidence of fracture or dislocation. 2.  Moderate sized suprapatellar joint effusion. This is similar to comparison exam.  Original Report Authenticated By: Genevive Bi, M.D.       MDM  Xrays. icepack applied. Reviewed nursing notes.   vicodin 1 po.   Recheck pt comfortable. Discussed xrays w pt.   Knee immobilizer per ortho tech.       Suzi Roots, MD 06/22/11 [phone]

## 2011-06-22 NOTE — Progress Notes (Signed)
Orthopedic Tech Progress Note Patient Details:  Glenda Gonzalez [birth year] [id number]  Other Ortho Devices Type of Ortho Device: Knee Immobilizer Ortho Device Location: right knee Ortho Device Interventions: Application   Nikki Dom 06/22/2011, 5:29 PM

## 2011-06-26 ENCOUNTER — Encounter (HOSPITAL_COMMUNITY): Payer: Self-pay

## 2011-06-26 ENCOUNTER — Emergency Department (HOSPITAL_COMMUNITY)
Admission: EM | Admit: 2011-06-26 | Discharge: 2011-06-26 | Disposition: A | Discharge status: Home / Self Care | Payer: Medicare Other | Attending: Emergency Medicine | Admitting: Emergency Medicine

## 2011-06-26 ENCOUNTER — Emergency Department (HOSPITAL_COMMUNITY): Payer: Medicare Other

## 2011-06-26 DIAGNOSIS — L03119 Cellulitis of unspecified part of limb: Secondary | ICD-10-CM | POA: Insufficient documentation

## 2011-06-26 DIAGNOSIS — R011 Cardiac murmur, unspecified: Secondary | ICD-10-CM | POA: Insufficient documentation

## 2011-06-26 DIAGNOSIS — F0390 Unspecified dementia without behavioral disturbance: Secondary | ICD-10-CM | POA: Insufficient documentation

## 2011-06-26 DIAGNOSIS — I10 Essential (primary) hypertension: Secondary | ICD-10-CM | POA: Insufficient documentation

## 2011-06-26 DIAGNOSIS — R000 Tachycardia, unspecified: Secondary | ICD-10-CM | POA: Insufficient documentation

## 2011-06-26 DIAGNOSIS — Z79899 Other long term (current) drug therapy: Secondary | ICD-10-CM | POA: Insufficient documentation

## 2011-06-26 DIAGNOSIS — E119 Type 2 diabetes mellitus without complications: Secondary | ICD-10-CM | POA: Insufficient documentation

## 2011-06-26 DIAGNOSIS — L0390 Cellulitis, unspecified: Secondary | ICD-10-CM

## 2011-06-26 DIAGNOSIS — K219 Gastro-esophageal reflux disease without esophagitis: Secondary | ICD-10-CM | POA: Insufficient documentation

## 2011-06-26 DIAGNOSIS — I509 Heart failure, unspecified: Secondary | ICD-10-CM | POA: Insufficient documentation

## 2011-06-26 DIAGNOSIS — Z8639 Personal history of other endocrine, nutritional and metabolic disease: Secondary | ICD-10-CM | POA: Insufficient documentation

## 2011-06-26 DIAGNOSIS — L02519 Cutaneous abscess of unspecified hand: Secondary | ICD-10-CM | POA: Insufficient documentation

## 2011-06-26 DIAGNOSIS — Z862 Personal history of diseases of the blood and blood-forming organs and certain disorders involving the immune mechanism: Secondary | ICD-10-CM | POA: Insufficient documentation

## 2011-06-26 MED ORDER — HYDROMORPHONE HCL PF 1 MG/ML IJ SOLN
0.5000 mg | Freq: Once | INTRAMUSCULAR | Status: DC
  Filled 2011-06-26: qty 1

## 2011-06-26 MED ORDER — TRAMADOL HCL 50 MG PO TABS: 50.0000 mg | ORAL_TABLET | Freq: Four times a day (QID) | ORAL | Status: AC | PRN

## 2011-06-26 MED ORDER — CLINDAMYCIN HCL 150 MG PO CAPS: 300.0000 mg | ORAL_CAPSULE | Freq: Four times a day (QID) | ORAL | Status: AC

## 2011-06-26 MED ORDER — CLINDAMYCIN HCL 300 MG PO CAPS
300.0000 mg | ORAL_CAPSULE | ORAL | Status: AC
  Administered 2011-06-26: 300 mg via ORAL
  Filled 2011-06-26 (×2): qty 1

## 2011-06-26 MED ORDER — IBUPROFEN 800 MG PO TABS
800.0000 mg | ORAL_TABLET | Freq: Once | ORAL | Status: AC
  Administered 2011-06-26: 800 mg via ORAL
  Filled 2011-06-26: qty 1

## 2011-06-26 NOTE — ED Notes (Signed)
Pt. Fell 2 weeks ago and reports had no injuries.  Yesterday her rt.hand began swelling, pan and red, Also very warm to touch,  Pt. Is unable to open her hand up or close

## 2011-06-26 NOTE — ED Notes (Signed)
Ice applied by Marchelle Folks to right hand.

## 2011-06-26 NOTE — ED Notes (Signed)
Pt d/c home with spouse. NAD. Pt stated she would follow up with PCP in the morning.

## 2011-06-26 NOTE — ED Notes (Signed)
Patient transported to X-ray 

## 2011-06-26 NOTE — ED Provider Notes (Signed)
History     CSN: [id number]  Arrival date & time 06/26/11  1508   First MD Initiated Contact with Patient 06/26/11 1554      Chief Complaint  Patient presents with  . Fall    Patient is a 78 y.o. female presenting with fall.  Fall Pertinent negatives include no vomiting.   The patient presents with right hand pain.  She notes that since a fall 2 weeks ago she has been in generally improving health, including capacity to ambulate, perform activities of daily living.  2 days ago she gradually developed new pain focally about the right MCP joint.  Since onset the pain has become worse.  The pain is sharp, burning, nonradiating, worse with touch or motion.  No relief with anything.  The patient denies any fevers, chills, vomiting, mental status changes, cough, chest pain, dyspnea or any other significant complaints.  She does note a history of gout in the past. Past Medical History  Diagnosis Date  . Diabetes mellitus   . CHF (congestive heart failure)   . GERD (gastroesophageal reflux disease)   . Hypertension   . Diverticulitis   . Gout   . Essential and other specified forms of tremor   . Dementia   . CAD (coronary artery disease)     Past Surgical History  Procedure Date  . Dilation and curettage of uterus   . Tumor removal     Family History  Problem Relation Age of Onset  . Colon cancer Neg Hx   . Heart disease Father   . Heart disease Brother     History  Substance Use Topics  . Smoking status: Never Smoker   . Smokeless tobacco: Never Used  . Alcohol Use: No    OB History    Grav Para Term Preterm Abortions TAB SAB Ect Mult Living                  Review of Systems  Constitutional:       HPI  HENT:       HPI otherwise negative  Eyes: Negative.   Respiratory:       HPI, otherwise negative  Cardiovascular:       HPI, otherwise nmegative  Gastrointestinal: Negative for vomiting.  Genitourinary:       HPI, otherwise negative  Musculoskeletal:     HPI, otherwise negative  Skin: Negative.   Neurological: Negative for syncope.    Allergies  Codeine; Morphine; and Penicillins  Home Medications   Current Outpatient Rx  Name Route Sig Dispense Refill  . ASPIRIN EC 81 MG PO TBEC Oral Take 81 mg by mouth daily.    Marland Kitchen BIMATOPROST 0.03 % OP SOLN Both Eyes Place 1 drop into both eyes 2 (two) times daily.     Marland Kitchen CLONIDINE HCL 0.2 MG PO TABS Oral Take 0.2 mg by mouth 3 (three) times daily.     . CYCLOBENZAPRINE HCL 10 MG PO TABS Oral Take 10 mg by mouth 3 (three) times daily as needed. For muscle spasms    . DICLOFENAC-MISOPROSTOL 75-200 MG-MCG PO TABS Oral Take 1 tablet by mouth 3 (three) times daily.     . DONEPEZIL HCL 10 MG PO TABS Oral Take 10 mg by mouth at bedtime.     Marland Kitchen ESOMEPRAZOLE MAGNESIUM 40 MG PO CPDR Oral Take 1 capsule (40 mg total) by mouth 2 (two) times daily before a meal. 60 capsule 11  . FUROSEMIDE 40 MG PO TABS Oral  Take 40 mg by mouth 2 (two) times daily.      . GLYBURIDE 5 MG PO TABS Oral Take 10 mg by mouth 2 (two) times daily with a meal.     . PREGABALIN 50 MG PO CAPS Oral Take 50 mg by mouth 3 (three) times daily.      . TRAMADOL HCL 50 MG PO TABS Oral Take 50 mg by mouth every 6 (six) hours as needed. For pain. Maximum dose= 8 tablets per day       BP 134/75  Pulse 104  Temp(Src) 98.1 F (36.7 C) (Oral)  Resp 18  Ht 5' 1.5" (1.562 m)  Wt 170 lb (77.111 kg)  BMI 31.60 kg/m2  SpO2 96%  Physical Exam  Nursing note and vitals reviewed. Constitutional: She is oriented to person, place, and time. She appears well-developed and well-nourished. No distress.  HENT:  Head: Normocephalic and atraumatic.  Eyes: Conjunctivae and EOM are normal.  Cardiovascular: Regular rhythm.  Tachycardia present.   Murmur heard. Pulmonary/Chest: Effort normal and breath sounds normal. No stridor. No respiratory distress.  Abdominal: She exhibits no distension.  Musculoskeletal: She exhibits no edema.        Arms: Neurological: She is alert and oriented to person, place, and time. No cranial nerve deficit.  Skin: Skin is warm and dry.  Psychiatric: She has a normal mood and affect.    ED Course  Procedures (including critical care time)  Labs Reviewed - No data to display No results found.   No diagnosis found.  XR, reviewed by me  MDM  This elderly female now presents with right hand pain.  On exam she is in no distress, though she has a hand that is tender to palpation, with preserved neurovascular status, noting limitation secondary to pain.  Given the patient's history of gout this is the most likely etiology, with consideration of cellulitis as well.  There is some concern for osteomyelitis.  The patient refused MRI.  She acknowledges the risks of not completing a full evaluation.  The patient was discharged with antibiotics, explicit return precautions, next day PMD followup        Gerhard Munch, MD 06/26/11 [phone]

## 2011-07-08 ENCOUNTER — Emergency Department (HOSPITAL_COMMUNITY): Payer: Medicare Other

## 2011-07-08 ENCOUNTER — Emergency Department (HOSPITAL_COMMUNITY)
Admission: EM | Admit: 2011-07-08 | Discharge: 2011-07-08 | Disposition: A | Discharge status: Home / Self Care | Payer: Medicare Other | Attending: Emergency Medicine | Admitting: Emergency Medicine

## 2011-07-08 ENCOUNTER — Encounter (HOSPITAL_COMMUNITY): Payer: Self-pay

## 2011-07-08 DIAGNOSIS — I10 Essential (primary) hypertension: Secondary | ICD-10-CM | POA: Insufficient documentation

## 2011-07-08 DIAGNOSIS — K219 Gastro-esophageal reflux disease without esophagitis: Secondary | ICD-10-CM | POA: Insufficient documentation

## 2011-07-08 DIAGNOSIS — Z8639 Personal history of other endocrine, nutritional and metabolic disease: Secondary | ICD-10-CM | POA: Insufficient documentation

## 2011-07-08 DIAGNOSIS — R739 Hyperglycemia, unspecified: Secondary | ICD-10-CM

## 2011-07-08 DIAGNOSIS — R29898 Other symptoms and signs involving the musculoskeletal system: Secondary | ICD-10-CM | POA: Insufficient documentation

## 2011-07-08 DIAGNOSIS — M79609 Pain in unspecified limb: Secondary | ICD-10-CM | POA: Insufficient documentation

## 2011-07-08 DIAGNOSIS — M25539 Pain in unspecified wrist: Secondary | ICD-10-CM | POA: Insufficient documentation

## 2011-07-08 DIAGNOSIS — I2510 Atherosclerotic heart disease of native coronary artery without angina pectoris: Secondary | ICD-10-CM | POA: Insufficient documentation

## 2011-07-08 DIAGNOSIS — R609 Edema, unspecified: Secondary | ICD-10-CM | POA: Insufficient documentation

## 2011-07-08 DIAGNOSIS — Z7982 Long term (current) use of aspirin: Secondary | ICD-10-CM | POA: Insufficient documentation

## 2011-07-08 DIAGNOSIS — F0390 Unspecified dementia without behavioral disturbance: Secondary | ICD-10-CM | POA: Insufficient documentation

## 2011-07-08 DIAGNOSIS — Z862 Personal history of diseases of the blood and blood-forming organs and certain disorders involving the immune mechanism: Secondary | ICD-10-CM | POA: Insufficient documentation

## 2011-07-08 DIAGNOSIS — M25531 Pain in right wrist: Secondary | ICD-10-CM

## 2011-07-08 DIAGNOSIS — E119 Type 2 diabetes mellitus without complications: Secondary | ICD-10-CM | POA: Insufficient documentation

## 2011-07-08 DIAGNOSIS — M7989 Other specified soft tissue disorders: Secondary | ICD-10-CM | POA: Insufficient documentation

## 2011-07-08 DIAGNOSIS — Z79899 Other long term (current) drug therapy: Secondary | ICD-10-CM | POA: Insufficient documentation

## 2011-07-08 DIAGNOSIS — I509 Heart failure, unspecified: Secondary | ICD-10-CM | POA: Insufficient documentation

## 2011-07-08 LAB — GLUCOSE, CAPILLARY: Glucose-Capillary: 237 mg/dL — ABNORMAL HIGH (ref 70–99)

## 2011-07-08 MED ORDER — HYDROCODONE-ACETAMINOPHEN 5-325 MG PO TABS
1.0000 | ORAL_TABLET | Freq: Once | ORAL | Status: AC
  Administered 2011-07-08: 1 via ORAL
  Filled 2011-07-08: qty 1

## 2011-07-08 MED ORDER — HYDROCODONE-ACETAMINOPHEN 5-500 MG PO TABS: 1.0000 | ORAL_TABLET | Freq: Four times a day (QID) | ORAL | Status: AC | PRN

## 2011-07-08 NOTE — Progress Notes (Signed)
Orthopedic Tech Progress Note Patient Details:  Glenda Gonzalez 1934-04-11 [id number]  Other Ortho Devices Type of Ortho Device: Other (comment) (velcro wrist splint) Ortho Device Location: (R) UE Ortho Device Interventions: Application   [name] 07/08/2011, 3:20 PM

## 2011-07-08 NOTE — ED Provider Notes (Addendum)
History     CSN: [id number]  Arrival date & time 07/08/11  1139   First MD Initiated Contact with Patient 07/08/11 1302      Chief Complaint  Patient presents with  . Hand Pain    (Consider location/radiation/quality/duration/timing/severity/associated sxs/prior treatment) Patient is a 78 y.o. female presenting with hand pain. The history is provided by the patient, the spouse and medical records.  Hand Pain   the patient is right hand dominant.  She complains of persistent right hand pain after she fell 2 weeks ago.  She says that she did have an x-ray, however, she was told that there was no fractures.  She has not fallen again and has no other complaints.  Past Medical History  Diagnosis Date  . Diabetes mellitus   . CHF (congestive heart failure)   . GERD (gastroesophageal reflux disease)   . Hypertension   . Diverticulitis   . Gout   . Essential and other specified forms of tremor   . Dementia   . CAD (coronary artery disease)     Past Surgical History  Procedure Date  . Dilation and curettage of uterus   . Tumor removal     Family History  Problem Relation Age of Onset  . Colon cancer Neg Hx   . Heart disease Father   . Heart disease Brother     History  Substance Use Topics  . Smoking status: Never Smoker   . Smokeless tobacco: Never Used  . Alcohol Use: No    OB History    Grav Para Term Preterm Abortions TAB SAB Ect Mult Living                  Review of Systems  Musculoskeletal:       Right hand pain  Neurological: Positive for weakness.       Weakness in right hand, due to pain  Hematological: Does not bruise/bleed easily.    Allergies  Codeine; Morphine; and Penicillins  Home Medications   Current Outpatient Rx  Name Route Sig Dispense Refill  . ASPIRIN EC 81 MG PO TBEC Oral Take 81 mg by mouth daily.    Marland Kitchen BIMATOPROST 0.03 % OP SOLN Both Eyes Place 1 drop into both eyes 2 (two) times daily.     Marland Kitchen CLONIDINE HCL 0.2 MG PO TABS Oral  Take 0.2 mg by mouth 3 (three) times daily.     . CYCLOBENZAPRINE HCL 10 MG PO TABS Oral Take 10 mg by mouth 3 (three) times daily as needed. For muscle spasms    . DICLOFENAC-MISOPROSTOL 75-200 MG-MCG PO TABS Oral Take 1 tablet by mouth 3 (three) times daily.     . DONEPEZIL HCL 10 MG PO TABS Oral Take 10 mg by mouth at bedtime.     Marland Kitchen ESOMEPRAZOLE MAGNESIUM 40 MG PO CPDR Oral Take 1 capsule (40 mg total) by mouth 2 (two) times daily before a meal. 60 capsule 11  . FUROSEMIDE 40 MG PO TABS Oral Take 40 mg by mouth 2 (two) times daily.     . GLYBURIDE 5 MG PO TABS Oral Take 10 mg by mouth 2 (two) times daily with a meal.     . PREGABALIN 50 MG PO CAPS Oral Take 50 mg by mouth 3 (three) times daily.      . TRAMADOL HCL 50 MG PO TABS Oral Take 50 mg by mouth every 6 (six) hours as needed. For pain. Maximum dose= 8 tablets per day  BP 135/94  Pulse 110  Temp(Src) 98.9 F (37.2 C) (Oral)  Resp 20  SpO2 94%  Physical Exam  Vitals reviewed. Constitutional: She is oriented to person, place, and time. She appears well-developed and well-nourished.  HENT:  Head: Normocephalic and atraumatic.  Eyes: Pupils are equal, round, and reactive to light.  Neck: Normal range of motion.  Pulmonary/Chest: Effort normal.  Musculoskeletal: She exhibits edema and tenderness.       Right hand Mild swelling over the dorsal and radial aspect of the hand around the long finger with tenderness to palpation.  No deformities.  No ecchymosis  Neurological: She is alert and oriented to person, place, and time.  Skin: Skin is warm and dry. No rash noted. No erythema.  Psychiatric: She has a normal mood and affect.    ED Course  Procedures (including critical care time) Persistent right hand pain and swelling after a fall in an elderly female.  We will give her Vicodin, and repeat an x-ray to make sure nothing was missed on the first exam.  Labs Reviewed - No data to display No results found.   No  diagnosis found.    MDM  Right hand pain No significant fx or dislocation        Nicholes Stairs, MD 07/08/11 1509  Nicholes Stairs, MD 07/08/11 [phone]

## 2011-07-08 NOTE — ED Notes (Signed)
Pt states that she has been having hand pain and swelling since a fall 3 weeks ago. Pt has noted swelling and bruising in rt hand. Prescriptions that were prescribed previously have not helped pain. Pt states that rt index finger has the worst pain.

## 2011-07-08 NOTE — ED Notes (Signed)
Pt complains of hand pain to right hand since 2 weeks ago, pt sts that she did inj during the snow but was seen for that and told no fx but remains swollen and painfu.

## 2011-07-08 NOTE — ED Notes (Addendum)
CBG results 237, notified RN Joni Reining

## 2011-07-08 NOTE — Progress Notes (Signed)
Orthopedic Tech Progress Note Patient Details:  Glenda Gonzalez [birth year] [id number]       [name] 07/08/2011, 3:20 PM

## 2011-07-08 NOTE — Discharge Instructions (Signed)
Your x-ray does not show any significant injuries.  Use the splint for support to reduce her pain.  Apply ice to reduce pain and swelling.  Use Vicodin for severe pain.  Followup with your Dr. for reevaluation.  If your symptoms.  Last more than a week.  Return for worse or uncontrolled symptoms

## 2011-07-20 ENCOUNTER — Encounter (HOSPITAL_COMMUNITY): Payer: Self-pay | Admitting: *Deleted

## 2011-07-20 ENCOUNTER — Emergency Department (HOSPITAL_COMMUNITY): Payer: Medicare Other

## 2011-07-20 ENCOUNTER — Inpatient Hospital Stay (HOSPITAL_COMMUNITY)
Admission: EM | Admit: 2011-07-20 | Discharge: 2011-07-24 | DRG: 372 | Disposition: A | Discharge status: Skilled Nursing Facility | Payer: Medicare Other | Attending: Internal Medicine | Admitting: Internal Medicine

## 2011-07-20 DIAGNOSIS — B3749 Other urogenital candidiasis: Secondary | ICD-10-CM | POA: Diagnosis present

## 2011-07-20 DIAGNOSIS — A0472 Enterocolitis due to Clostridium difficile, not specified as recurrent: Principal | ICD-10-CM | POA: Diagnosis present

## 2011-07-20 DIAGNOSIS — M109 Gout, unspecified: Secondary | ICD-10-CM | POA: Diagnosis present

## 2011-07-20 DIAGNOSIS — I10 Essential (primary) hypertension: Secondary | ICD-10-CM | POA: Diagnosis present

## 2011-07-20 DIAGNOSIS — R197 Diarrhea, unspecified: Secondary | ICD-10-CM | POA: Diagnosis present

## 2011-07-20 DIAGNOSIS — E876 Hypokalemia: Secondary | ICD-10-CM | POA: Diagnosis present

## 2011-07-20 DIAGNOSIS — R739 Hyperglycemia, unspecified: Secondary | ICD-10-CM

## 2011-07-20 DIAGNOSIS — E871 Hypo-osmolality and hyponatremia: Secondary | ICD-10-CM | POA: Diagnosis present

## 2011-07-20 DIAGNOSIS — R531 Weakness: Secondary | ICD-10-CM | POA: Diagnosis present

## 2011-07-20 DIAGNOSIS — E119 Type 2 diabetes mellitus without complications: Secondary | ICD-10-CM | POA: Diagnosis present

## 2011-07-20 DIAGNOSIS — K219 Gastro-esophageal reflux disease without esophagitis: Secondary | ICD-10-CM | POA: Diagnosis present

## 2011-07-20 DIAGNOSIS — Z9181 History of falling: Secondary | ICD-10-CM

## 2011-07-20 DIAGNOSIS — D696 Thrombocytopenia, unspecified: Secondary | ICD-10-CM | POA: Diagnosis present

## 2011-07-20 DIAGNOSIS — I9589 Other hypotension: Secondary | ICD-10-CM | POA: Diagnosis present

## 2011-07-20 DIAGNOSIS — N390 Urinary tract infection, site not specified: Secondary | ICD-10-CM | POA: Diagnosis present

## 2011-07-20 DIAGNOSIS — I509 Heart failure, unspecified: Secondary | ICD-10-CM | POA: Diagnosis present

## 2011-07-20 DIAGNOSIS — I2510 Atherosclerotic heart disease of native coronary artery without angina pectoris: Secondary | ICD-10-CM | POA: Diagnosis present

## 2011-07-20 DIAGNOSIS — E162 Hypoglycemia, unspecified: Secondary | ICD-10-CM | POA: Diagnosis present

## 2011-07-20 DIAGNOSIS — M1990 Unspecified osteoarthritis, unspecified site: Secondary | ICD-10-CM | POA: Diagnosis present

## 2011-07-20 DIAGNOSIS — W19XXXA Unspecified fall, initial encounter: Secondary | ICD-10-CM | POA: Diagnosis present

## 2011-07-20 DIAGNOSIS — Z8719 Personal history of other diseases of the digestive system: Secondary | ICD-10-CM

## 2011-07-20 DIAGNOSIS — N289 Disorder of kidney and ureter, unspecified: Secondary | ICD-10-CM

## 2011-07-20 DIAGNOSIS — R5381 Other malaise: Secondary | ICD-10-CM | POA: Diagnosis present

## 2011-07-20 DIAGNOSIS — I5032 Chronic diastolic (congestive) heart failure: Secondary | ICD-10-CM | POA: Diagnosis present

## 2011-07-20 DIAGNOSIS — E1169 Type 2 diabetes mellitus with other specified complication: Secondary | ICD-10-CM | POA: Diagnosis present

## 2011-07-20 DIAGNOSIS — F068 Other specified mental disorders due to known physiological condition: Secondary | ICD-10-CM | POA: Diagnosis present

## 2011-07-20 LAB — CBC
HCT: 30 % — ABNORMAL LOW (ref 36.0–46.0)
Hemoglobin: 10 g/dL — ABNORMAL LOW (ref 12.0–15.0)
MCH: 27.1 pg (ref 26.0–34.0)
MCHC: 33.3 g/dL (ref 30.0–36.0)
MCV: 81.3 fL (ref 78.0–100.0)
RDW: 17.8 % — ABNORMAL HIGH (ref 11.5–15.5)

## 2011-07-20 LAB — URINALYSIS, ROUTINE W REFLEX MICROSCOPIC
Bilirubin Urine: NEGATIVE
Glucose, UA: 250 mg/dL — AB
Nitrite: NEGATIVE
Specific Gravity, Urine: 1.014 (ref 1.005–1.030)
pH: 5.5 (ref 5.0–8.0)

## 2011-07-20 LAB — DIFFERENTIAL
Basophils Absolute: 0 10*3/uL (ref 0.0–0.1)
Basophils Relative: 0 % (ref 0–1)
Basophils Relative: 0 % (ref 0–1)
Eosinophils Absolute: 0.2 10*3/uL (ref 0.0–0.7)
Eosinophils Relative: 1 % (ref 0–5)
Lymphocytes Relative: 8 % — ABNORMAL LOW (ref 12–46)
Lymphs Abs: 1.1 10*3/uL (ref 0.7–4.0)
Monocytes Absolute: 0.7 10*3/uL (ref 0.1–1.0)
Monocytes Relative: 4 % (ref 3–12)
Monocytes Relative: 5 % (ref 3–12)
Neutro Abs: 12.9 10*3/uL — ABNORMAL HIGH (ref 1.7–7.7)
Neutro Abs: 11 10*3/uL — ABNORMAL HIGH (ref 1.7–7.7)
Neutrophils Relative %: 88 % — ABNORMAL HIGH (ref 43–77)

## 2011-07-20 LAB — BASIC METABOLIC PANEL
BUN: 45 mg/dL — ABNORMAL HIGH (ref 6–23)
CO2: 22 mEq/L (ref 19–32)
Chloride: 88 mEq/L — ABNORMAL LOW (ref 96–112)
GFR calc Af Amer: 29 mL/min — ABNORMAL LOW (ref >90)
Potassium: 2.7 mEq/L — CL (ref 3.5–5.1)

## 2011-07-20 LAB — URINE MICROSCOPIC-ADD ON

## 2011-07-20 LAB — PROTIME-INR: Prothrombin Time: 16.3 seconds — ABNORMAL HIGH (ref 11.6–15.2)

## 2011-07-20 MED ORDER — BIMATOPROST 0.03 % OP SOLN
1.0000 [drp] | Freq: Two times a day (BID) | OPHTHALMIC | Status: DC
  Administered 2011-07-20 – 2011-07-24 (×8): 1 [drp] via OPHTHALMIC
  Filled 2011-07-20: qty 2.5

## 2011-07-20 MED ORDER — POTASSIUM CHLORIDE CRYS ER 20 MEQ PO TBCR
40.0000 meq | EXTENDED_RELEASE_TABLET | Freq: Once | ORAL | Status: AC
  Administered 2011-07-20: 40 meq via ORAL
  Filled 2011-07-20: qty 1

## 2011-07-20 MED ORDER — ADULT MULTIVITAMIN W/MINERALS CH
1.0000 | ORAL_TABLET | Freq: Every day | ORAL | Status: DC
  Administered 2011-07-20 – 2011-07-24 (×5): 1 via ORAL
  Filled 2011-07-20 (×5): qty 1

## 2011-07-20 MED ORDER — ACETAMINOPHEN 325 MG PO TABS: 650.0000 mg | ORAL_TABLET | Freq: Four times a day (QID) | ORAL | Status: DC | PRN

## 2011-07-20 MED ORDER — ONDANSETRON HCL 4 MG PO TABS: 4.0000 mg | ORAL_TABLET | Freq: Four times a day (QID) | ORAL | Status: DC | PRN

## 2011-07-20 MED ORDER — POTASSIUM CHLORIDE CRYS ER 20 MEQ PO TBCR
40.0000 meq | EXTENDED_RELEASE_TABLET | Freq: Once | ORAL | Status: AC
  Administered 2011-07-20: 40 meq via ORAL
  Filled 2011-07-20: qty 2

## 2011-07-20 MED ORDER — ALBUTEROL SULFATE (5 MG/ML) 0.5% IN NEBU: 2.5000 mg | INHALATION_SOLUTION | RESPIRATORY_TRACT | Status: DC | PRN

## 2011-07-20 MED ORDER — FENTANYL CITRATE 0.05 MG/ML IJ SOLN
50.0000 ug | Freq: Once | INTRAMUSCULAR | Status: AC
  Administered 2011-07-20: 50 ug via INTRAVENOUS
  Filled 2011-07-20: qty 2

## 2011-07-20 MED ORDER — INSULIN ASPART 100 UNIT/ML ~~LOC~~ SOLN
0.0000 [IU] | Freq: Three times a day (TID) | SUBCUTANEOUS | Status: DC
  Administered 2011-07-21: 2 [IU] via SUBCUTANEOUS
  Administered 2011-07-21: 3 [IU] via SUBCUTANEOUS
  Administered 2011-07-21 – 2011-07-22 (×2): 5 [IU] via SUBCUTANEOUS
  Administered 2011-07-22: 15 [IU] via SUBCUTANEOUS
  Administered 2011-07-23: 3 [IU] via SUBCUTANEOUS
  Administered 2011-07-23: 8 [IU] via SUBCUTANEOUS
  Administered 2011-07-23: 3 [IU] via SUBCUTANEOUS
  Administered 2011-07-24: 2 [IU] via SUBCUTANEOUS

## 2011-07-20 MED ORDER — ZOLPIDEM TARTRATE 5 MG PO TABS
5.0000 mg | ORAL_TABLET | Freq: Every evening | ORAL | Status: DC | PRN
  Administered 2011-07-24: 5 mg via ORAL
  Filled 2011-07-20: qty 1

## 2011-07-20 MED ORDER — POTASSIUM CHLORIDE IN NACL 20-0.9 MEQ/L-% IV SOLN
INTRAVENOUS | Status: AC
  Administered 2011-07-20: 100 mL/h via INTRAVENOUS
  Filled 2011-07-20 (×2): qty 1000

## 2011-07-20 MED ORDER — ACETAMINOPHEN 650 MG RE SUPP: 650.0000 mg | Freq: Four times a day (QID) | RECTAL | Status: DC | PRN

## 2011-07-20 MED ORDER — CLONIDINE HCL 0.2 MG PO TABS
0.2000 mg | ORAL_TABLET | Freq: Three times a day (TID) | ORAL | Status: DC
  Administered 2011-07-20 – 2011-07-21 (×3): 0.2 mg via ORAL
  Filled 2011-07-20 (×7): qty 1

## 2011-07-20 MED ORDER — TRAMADOL HCL 50 MG PO TABS
50.0000 mg | ORAL_TABLET | Freq: Four times a day (QID) | ORAL | Status: DC | PRN
  Administered 2011-07-22 (×2): 50 mg via ORAL
  Filled 2011-07-20 (×2): qty 1

## 2011-07-20 MED ORDER — SODIUM CHLORIDE 0.9 % IV BOLUS (SEPSIS)
1000.0000 mL | Freq: Once | INTRAVENOUS | Status: AC
  Administered 2011-07-20: 1000 mL via INTRAVENOUS

## 2011-07-20 MED ORDER — CYCLOBENZAPRINE HCL 10 MG PO TABS
10.0000 mg | ORAL_TABLET | Freq: Three times a day (TID) | ORAL | Status: DC | PRN
  Filled 2011-07-20: qty 1

## 2011-07-20 MED ORDER — CEFTRIAXONE SODIUM 1 G IJ SOLR
1.0000 g | Freq: Once | INTRAMUSCULAR | Status: AC
  Administered 2011-07-20: 1 g via INTRAVENOUS
  Filled 2011-07-20: qty 10

## 2011-07-20 MED ORDER — SODIUM CHLORIDE 0.9 % IJ SOLN
3.0000 mL | Freq: Two times a day (BID) | INTRAMUSCULAR | Status: DC
  Administered 2011-07-20 – 2011-07-24 (×4): 3 mL via INTRAVENOUS

## 2011-07-20 MED ORDER — INSULIN ASPART 100 UNIT/ML ~~LOC~~ SOLN
0.0000 [IU] | Freq: Every day | SUBCUTANEOUS | Status: DC
  Administered 2011-07-20: 3 [IU] via SUBCUTANEOUS
  Filled 2011-07-20: qty 3

## 2011-07-20 MED ORDER — ONDANSETRON HCL 4 MG/2ML IJ SOLN: 4.0000 mg | Freq: Four times a day (QID) | INTRAMUSCULAR | Status: DC | PRN

## 2011-07-20 MED ORDER — SODIUM CHLORIDE 0.9 % IV SOLN
Freq: Once | INTRAVENOUS | Status: AC
  Administered 2011-07-20: 15:00:00 via INTRAVENOUS

## 2011-07-20 MED ORDER — DEXTROSE 5 % IV SOLN
1.0000 g | INTRAVENOUS | Status: DC
  Administered 2011-07-21: 1 g via INTRAVENOUS
  Filled 2011-07-20 (×2): qty 10

## 2011-07-20 MED ORDER — DONEPEZIL HCL 10 MG PO TABS
10.0000 mg | ORAL_TABLET | Freq: Every day | ORAL | Status: DC
  Administered 2011-07-20 – 2011-07-23 (×4): 10 mg via ORAL
  Filled 2011-07-20 (×5): qty 1

## 2011-07-20 MED ORDER — INSULIN GLARGINE 100 UNIT/ML ~~LOC~~ SOLN
5.0000 [IU] | Freq: Every day | SUBCUTANEOUS | Status: DC
  Administered 2011-07-20 – 2011-07-21 (×2): 5 [IU] via SUBCUTANEOUS
  Filled 2011-07-20: qty 3

## 2011-07-20 NOTE — ED Notes (Signed)
IV team at patient bedside. 

## 2011-07-20 NOTE — H&P (Signed)
Glenda Gonzalez is an 78 y.o. female.    PCP: Billee Cashing, MD, MD   Chief Complaint: Weakness, and fall  HPI: This is a 78 year old, [name], with a past medical history of, diabetes, gout, hypertension, diastolic dysfunction diverticulosis who was in her usual state of health till February 12 when she had to visit the emergency department for left hand pain. She was diagnosed as having gout and was prescribed Vicodin. She was asked to followup with her PCP. She did go to her primary care physician the following day and at this point the husband is not sure if an antibiotic was prescribed or not.  Patient had been doing well except for the last couple days when she has been feeling extremely weak. She hasn't been able to stand or walk up over the last 24 hours. She's requiring assistance, to go to the bathroom, etc. And she had a fall yesterday on her left side, but did not hurt her head. There is no history of any fever or chills. She has been somewhat lightheaded, but denies any syncopal episodes. No history of chest pain. She does have shortness of breath with exertion at baseline. Patient feels, that she has been more forgetful lately. Denies any urinary complaints. Has been having loose stools over the last 3-4 days. Denies any abdominal pain, nausea, or vomiting. Except for the Vicodin patient denies being on any new medications. Once, again, it is not clear if PCP prescribed antibiotic during the last visit.   Home Medications: Prior to Admission medications   Medication Sig Start Date End Date Taking? Authorizing Provider  aspirin EC 81 MG tablet Take 81 mg by mouth daily.   Yes Historical Provider, MD  bimatoprost (LUMIGAN) 0.03 % ophthalmic solution Place 1 drop into both eyes 2 (two) times daily.    Yes Historical Provider, MD  cloNIDine (CATAPRES) 0.2 MG tablet Take 0.2 mg by mouth 3 (three) times daily.    Yes Historical Provider, MD  cyclobenzaprine  (FLEXERIL) 10 MG tablet Take 10 mg by mouth 3 (three) times daily as needed. For muscle spasms   Yes Historical Provider, MD  donepezil (ARICEPT) 10 MG tablet Take 10 mg by mouth at bedtime.    Yes Historical Provider, MD  esomeprazole (NEXIUM) 40 MG capsule Take 1 capsule (40 mg total) by mouth 2 (two) times daily before a meal. 06/17/11 06/16/12 Yes Rob Bunting, MD  furosemide (LASIX) 40 MG tablet Take 40 mg by mouth 2 (two) times daily.    Yes Historical Provider, MD  glyBURIDE (DIABETA) 5 MG tablet Take 10 mg by mouth 2 (two) times daily with a meal.    Yes Historical Provider, MD  HYDROcodone-acetaminophen (VICODIN) 5-500 MG per tablet Take 1-2 tablets by mouth every 6 (six) hours as needed. For pain   Yes Historical Provider, MD  pregabalin (LYRICA) 50 MG capsule Take 50 mg by mouth 3 (three) times daily.     Yes Historical Provider, MD  traMADol (ULTRAM) 50 MG tablet Take 50 mg by mouth every 6 (six) hours as needed. For pain. Maximum dose= 8 tablets per day   Yes Historical Provider, MD  zolpidem (AMBIEN) 5 MG tablet Take 5 mg by mouth at bedtime as needed. For sleep   Yes Historical Provider, MD    Allergies:  Allergies  Allergen Reactions  . Codeine Other (See Comments)    hallucination  . Morphine Itching  . Penicillins Itching    Past Medical History:  Past Medical History  Diagnosis Date  . Diabetes mellitus   . GERD (gastroesophageal reflux disease)   . Hypertension   . Diverticulitis   . Gout   . Essential and other specified forms of tremor   . Dementia   . CAD (coronary artery disease)   . CHF (congestive heart failure)     Diastolic dysfunction    Past Surgical History  Procedure Date  . Dilation and curettage of uterus   . Tumor removal     Social History:  reports that she has never smoked. She has never used smokeless tobacco. She reports that she does not drink alcohol or use illicit drugs.  Family History:  Family History  Problem Relation Age of  Onset  . Colon cancer Neg Hx   . Heart disease Father   . Heart disease Brother     Review of Systems - History obtained from the patient and husband General ROS: positive for  - fatigue Psychological ROS: negative Ophthalmic ROS: negative ENT ROS: negative Allergy and Immunology ROS: negative Hematological and Lymphatic ROS: negative Endocrine ROS: negative Respiratory ROS: negative Cardiovascular ROS: negative Gastrointestinal ROS: positive for - diarrhea Genito-Urinary ROS: negative Musculoskeletal ROS: positive for - joint pain Neurological ROS: negative Dermatological ROS: negative  Physical Examination Blood pressure 107/67, pulse 79, temperature 97.7 F (36.5 C), temperature source Oral, resp. rate 18, SpO2 100.00%.  General appearance: alert, cooperative, no distress and moderately obese Head: Normocephalic, without obvious abnormality, atraumatic Eyes: conjunctivae/corneas clear. PERRL, EOM's intact.  Throat: lips, mucosa, and tongue normal; teeth and gums normal Neck: no adenopathy, no carotid bruit, no JVD, supple, symmetrical, trachea midline and thyroid not enlarged, symmetric, no tenderness/mass/nodules Resp: clear to auscultation bilaterally Cardio: regular rate and rhythm, S1, S2 normal, no murmur, click, rub or gallop GI: soft, non-tender; bowel sounds normal; no masses,  no organomegaly Extremities: Slightly swollen knee joints, no erythema, restricted ROM on left knee Pulses: 2+ and symmetric Skin: chronic skin changes Lymph nodes: Cervical, supraclavicular, and axillary nodes normal. Neurologic: Normal cranial nerves. Slightly diminished strength in left LE is due to knee pain and not neurological. No other focal deficits.  Laboratory Data: Results for orders placed during the hospital encounter of 07/20/11 (from the past 48 hour(s))  CBC     Status: Abnormal   Collection Time   07/20/11 12:54 PM      Component Value Range Comment   WBC 14.6 (*) 4.0 -  10.5 (K/uL)    RBC 4.13  3.87 - 5.11 (MIL/uL)    Hemoglobin 11.3 (*) 12.0 - 15.0 (g/dL)    HCT 44.0 (*) 10.2 - 46.0 (%)    MCV 81.4  78.0 - 100.0 (fL)    MCH 27.4  26.0 - 34.0 (pg)    MCHC 33.6  30.0 - 36.0 (g/dL)    RDW 72.5 (*) 36.6 - 15.5 (%)    Platelets 49 (*) 150 - 400 (K/uL)   DIFFERENTIAL     Status: Abnormal   Collection Time   07/20/11 12:54 PM      Component Value Range Comment   Neutrophils Relative 88 (*) 43 - 77 (%)    Neutro Abs 12.9 (*) 1.7 - 7.7 (K/uL)    Lymphocytes Relative 8 (*) 12 - 46 (%)    Lymphs Abs 1.1  0.7 - 4.0 (K/uL)    Monocytes Relative 4  3 - 12 (%)    Monocytes Absolute 0.5  0.1 - 1.0 (K/uL)  Eosinophils Relative 0  0 - 5 (%)    Eosinophils Absolute 0.1  0.0 - 0.7 (K/uL)    Basophils Relative 0  0 - 1 (%)    Basophils Absolute 0.0  0.0 - 0.1 (K/uL)   BASIC METABOLIC PANEL     Status: Abnormal   Collection Time   07/20/11 12:54 PM      Component Value Range Comment   Sodium 128 (*) 135 - 145 (mEq/L)    Potassium 2.7 (*) 3.5 - 5.1 (mEq/L)    Chloride 88 (*) 96 - 112 (mEq/L)    CO2 22  19 - 32 (mEq/L)    Glucose, Bld 333 (*) 70 - 99 (mg/dL)    BUN 45 (*) 6 - 23 (mg/dL)    Creatinine, Ser 1.61 (*) 0.50 - 1.10 (mg/dL)    Calcium 8.0 (*) 8.4 - 10.5 (mg/dL)    GFR calc non Af Amer 25 (*) >90 (mL/min)    GFR calc Af Amer 29 (*) >90 (mL/min)   LACTATE DEHYDROGENASE     Status: Normal   Collection Time   07/20/11 12:54 PM      Component Value Range Comment   LD 243  94 - 250 (U/L) HEMOLYSIS AT THIS LEVEL MAY AFFECT RESULT  URINALYSIS, ROUTINE W REFLEX MICROSCOPIC     Status: Abnormal   Collection Time   07/20/11  1:17 PM      Component Value Range Comment   Color, Urine YELLOW  YELLOW     APPearance CLOUDY (*) CLEAR     Specific Gravity, Urine 1.014  1.005 - 1.030     pH 5.5  5.0 - 8.0     Glucose, UA 250 (*) NEGATIVE (mg/dL)    Hgb urine dipstick NEGATIVE  NEGATIVE     Bilirubin Urine NEGATIVE  NEGATIVE     Ketones, ur NEGATIVE  NEGATIVE  (mg/dL)    Protein, ur NEGATIVE  NEGATIVE (mg/dL)    Urobilinogen, UA 0.2  0.0 - 1.0 (mg/dL)    Nitrite NEGATIVE  NEGATIVE     Leukocytes, UA MODERATE (*) NEGATIVE    URINE MICROSCOPIC-ADD ON     Status: Normal   Collection Time   07/20/11  1:17 PM      Component Value Range Comment   Squamous Epithelial / LPF RARE  RARE     WBC, UA 7-10  <3 (WBC/hpf)    Urine-Other MANY YEAST       Radiology Reports: Dg Chest 2 View  07/20/2011  *RADIOLOGY REPORT*  Clinical Data: Fall.  Shortness of breath.  CHEST - 2 VIEW 07/20/2011:  Comparison: Two-view chest x-ray 05/30/2011, 04/19/2011, 10/11/2010, 12/20/2009 [facility].  Findings: Cardiac silhouette mildly enlarged but stable.  Thoracic aorta mildly tortuous and atherosclerotic, unchanged.  Hilar and mediastinal contours otherwise unremarkable.  Prominent bronchovascular markings diffusely and mild central peribronchial thickening, unchanged.  Lungs otherwise clear.  No pleural effusions.  Visualized bony thorax intact.  IMPRESSION: Stable mild cardiomegaly and chronic bronchitis.  No acute cardiopulmonary disease.  Original Report Authenticated By: Arnell Sieving, M.D.   Dg Pelvis 1-2 Views  07/20/2011  *RADIOLOGY REPORT*  Clinical Data: Fall  PELVIS - 1-2 VIEW  Comparison: None.  Findings: No acute fracture and no dislocation.  Mild osteopenia.  IMPRESSION: No acute bony pathology.  Original Report Authenticated By: Donavan Burnet, M.D.   Dg Femur Left  07/20/2011  *RADIOLOGY REPORT*  Clinical Data: Fall  LEFT FEMUR - 2 VIEW  Comparison:  None.  Findings: No acute fracture and no dislocation.  Unremarkable soft tissues.  IMPRESSION: No acute bony pathology.  Original Report Authenticated By: Donavan Burnet, M.D.   Dg Ankle Complete Left  07/20/2011  *RADIOLOGY REPORT*  Clinical Data: Fall.  Ankle pain.  LEFT ANKLE COMPLETE - 3+ VIEW  Comparison: 04/05/2007  Findings: Hardware in the distal fibula is stable.  Degenerative changes in the  ankle joint have progressed.  Osteopenia.  No breakage or loosening of the hardware.  No acute fracture. Degenerative changes in the midfoot are noted.  Minimal spur at the inferior calcaneus. Soft tissue swelling about the ankle is noted.  IMPRESSION: No acute bony pathology.  Chronic changes.  Original Report Authenticated By: Donavan Burnet, M.D.    Assessment/Plan  Principal Problem:  *Thrombocytopenia Active Problems:  DM  GOUT  DEMENTIA, MILD  HYPERTENSION  Generalized weakness  Fall  Hypokalemia  Hyponatremia  Acute diarrhea   #1 generalized weakness: This appears to be multifactorial. Urinary tract infection as well as acute gout seems to be playing a role. Physical therapy and occupational therapy will be asked to evaluate this patient. CT head will be followed up on.  #2 UTI: Urine cultures were followed up on. Patient will be put on ceftriaxone for now.  #3. Thrombocytopenia: This is new. Her platelet counts were normal just a month ago. Etiology for this is not entirely clear but could have something to do with her acute illness. It could be secondary to Vicodin, which is known to cause thrombocytopenia. We will hold all the agents that can aggravate this. We will monitor her counts closely on a daily basis. Have already discussed this with Dr. Clelia Croft. LDH is normal so likelyhood for TTP is pretty low.  #4 acute diarrhea: Because of a questionable history of recent antibiotic use the patient could have C. difficile. Stool will be sent for PCR testing.  #5 joint pains is possibly secondary to gout. We'll check a uric acid level. Because of her chronic kidney disease we cannot use nonsteroidals. Colchicine cannot be used because of her thrombocytopenia. The only avenue left is the prednisone. I will await for the results of the C. difficile before prescribing prednisone as that can make, C. difficile, worse.  #6 hypokalemia and hyponatremia: This is likely due to dehydration  and diarrhea. We will give her intravenous fluids, along with intravenous potassium. Magnesium levels will be checked.  #7 history of hypertension, stable.   #8 diabetes, poorly controlled: HbA1c will be checked. We'll put her on a low-dose Lantus insulin along with SSI. We'll hold her oral hypoglycemic agent as that can induce thrombocytopenia as well.  We'll also hold the patient's Lyrica for same reasons.  She's a full code. DVT, prophylaxis with SCDs.  Further management decisions will depend on results of further testing and patient's response to treatment    [facility] Pager [phone]  07/20/2011, 5:31 PM

## 2011-07-20 NOTE — ED Provider Notes (Addendum)
History     CSN: [id number]  Arrival date & time 07/20/11  1209   First MD Initiated Contact with Patient 07/20/11 1215      Chief Complaint  Patient presents with  . Fall    (Consider location/radiation/quality/duration/timing/severity/associated sxs/prior treatment) HPI Comments: Patient presents after a fall out of bed yesterday.  The son reports the patient had been sleeping and she rolled and fell out of bed.  She was unable to stand at that time and his not been able to stand since this occurred yesterday.  Patient is in her normal mental status.  She has some mild pain over her left hip knee and ankle.  She has no fevers.  When asked if she has any dysuria she does note intermittent dysuria.  She denies any shortness of breath or chest pain or cough.  She does live with her husband.  Patient is a 78 y.o. female presenting with fall. The history is provided by the patient, the spouse and a relative. No language interpreter was used.  Fall The accident occurred yesterday. She fell from a height of 3 to 5 ft. She landed on carpet. There was no blood loss. The point of impact was the left hip. The pain is mild. She was not ambulatory at the scene. There was no entrapment after the fall. There was no drug use involved in the accident. There was no alcohol use involved in the accident. Pertinent negatives include no visual change, no fever, no numbness, no abdominal pain, no bowel incontinence, no nausea, no vomiting, no hematuria, no headaches, no hearing loss, no loss of consciousness and no tingling.    Past Medical History  Diagnosis Date  . Diabetes mellitus   . CHF (congestive heart failure)   . GERD (gastroesophageal reflux disease)   . Hypertension   . Diverticulitis   . Gout   . Essential and other specified forms of tremor   . Dementia   . CAD (coronary artery disease)     Past Surgical History  Procedure Date  . Dilation and curettage of uterus   . Tumor removal      Family History  Problem Relation Age of Onset  . Colon cancer Neg Hx   . Heart disease Father   . Heart disease Brother     History  Substance Use Topics  . Smoking status: Never Smoker   . Smokeless tobacco: Never Used  . Alcohol Use: No    OB History    Grav Para Term Preterm Abortions TAB SAB Ect Mult Living                  Review of Systems  Constitutional: Negative.  Negative for fever and chills.  HENT: Negative.   Eyes: Negative.  Negative for discharge and redness.  Respiratory: Negative.  Negative for cough and shortness of breath.   Cardiovascular: Negative.  Negative for chest pain.  Gastrointestinal: Negative.  Negative for nausea, vomiting, abdominal pain, diarrhea and bowel incontinence.  Genitourinary: Positive for dysuria. Negative for hematuria and vaginal discharge.  Musculoskeletal: Negative for back pain.  Skin: Negative.  Negative for color change and rash.  Neurological: Negative.  Negative for tingling, loss of consciousness, syncope, numbness and headaches.  Hematological: Negative.  Negative for adenopathy.  Psychiatric/Behavioral: Negative.  Negative for confusion.  All other systems reviewed and are negative.    Allergies  Codeine; Morphine; and Penicillins  Home Medications   Current Outpatient Rx  Name Route Sig  Dispense Refill  . ASPIRIN EC 81 MG PO TBEC Oral Take 81 mg by mouth daily.    Marland Kitchen BIMATOPROST 0.03 % OP SOLN Both Eyes Place 1 drop into both eyes 2 (two) times daily.     Marland Kitchen CLONIDINE HCL 0.2 MG PO TABS Oral Take 0.2 mg by mouth 3 (three) times daily.     . CYCLOBENZAPRINE HCL 10 MG PO TABS Oral Take 10 mg by mouth 3 (three) times daily as needed. For muscle spasms    . DONEPEZIL HCL 10 MG PO TABS Oral Take 10 mg by mouth at bedtime.     Marland Kitchen ESOMEPRAZOLE MAGNESIUM 40 MG PO CPDR Oral Take 1 capsule (40 mg total) by mouth 2 (two) times daily before a meal. 60 capsule 11  . FUROSEMIDE 40 MG PO TABS Oral Take 40 mg by mouth 2  (two) times daily.     . GLYBURIDE 5 MG PO TABS Oral Take 10 mg by mouth 2 (two) times daily with a meal.     . HYDROCODONE-ACETAMINOPHEN 5-500 MG PO TABS Oral Take 1-2 tablets by mouth every 6 (six) hours as needed. For pain    . PREGABALIN 50 MG PO CAPS Oral Take 50 mg by mouth 3 (three) times daily.      . TRAMADOL HCL 50 MG PO TABS Oral Take 50 mg by mouth every 6 (six) hours as needed. For pain. Maximum dose= 8 tablets per day    . ZOLPIDEM TARTRATE 5 MG PO TABS Oral Take 5 mg by mouth at bedtime as needed. For sleep      SpO2 98%  Physical Exam  Nursing note and vitals reviewed. Constitutional: She is oriented to person, place, and time. She appears well-developed and well-nourished.  Non-toxic appearance. She does not have a sickly appearance.  HENT:  Head: Normocephalic and atraumatic.  Eyes: Conjunctivae, EOM and lids are normal. Pupils are equal, round, and reactive to light. No scleral icterus.  Neck: Trachea normal and normal range of motion. Neck supple.  Cardiovascular: Normal rate, regular rhythm and normal heart sounds.   Pulmonary/Chest: Effort normal and breath sounds normal. No respiratory distress. She has no wheezes. She has no rales. She exhibits no tenderness.  Abdominal: Soft. Normal appearance. There is no tenderness. There is no rebound, no guarding and no CVA tenderness.  Musculoskeletal: Normal range of motion. She exhibits edema and tenderness.       Mild tenderness to palpation over her left hip but does allow me some passive range of motion of her hip.  She also has some tenderness to palpation over her thigh and knee.  No obvious deformities are noted there.  Patient does have some mild to moderate lower extremities swelling that is symmetric bilaterally.  She has tenderness palpation over her medial and lateral left ankle.  No erythema or other wounds are noted.  Neurological: She is alert and oriented to person, place, and time. She has normal strength.  Skin:  Skin is warm, dry and intact. No rash noted.  Psychiatric: She has a normal mood and affect. Her behavior is normal. Judgment and thought content normal.    ED Course  Procedures (including critical care time)  Results for orders placed during the hospital encounter of 07/20/11  CBC      Component Value Range   WBC 14.6 (*) 4.0 - 10.5 (K/uL)   RBC 4.13  3.87 - 5.11 (MIL/uL)   Hemoglobin 11.3 (*) 12.0 - 15.0 (g/dL)  HCT 33.6 (*) 36.0 - 46.0 (%)   MCV 81.4  78.0 - 100.0 (fL)   MCH 27.4  26.0 - 34.0 (pg)   MCHC 33.6  30.0 - 36.0 (g/dL)   RDW 65.7 (*) 84.6 - 15.5 (%)   Platelets 49 (*) 150 - 400 (K/uL)  DIFFERENTIAL      Component Value Range   Neutrophils Relative 88 (*) 43 - 77 (%)   Neutro Abs 12.9 (*) 1.7 - 7.7 (K/uL)   Lymphocytes Relative 8 (*) 12 - 46 (%)   Lymphs Abs 1.1  0.7 - 4.0 (K/uL)   Monocytes Relative 4  3 - 12 (%)   Monocytes Absolute 0.5  0.1 - 1.0 (K/uL)   Eosinophils Relative 0  0 - 5 (%)   Eosinophils Absolute 0.1  0.0 - 0.7 (K/uL)   Basophils Relative 0  0 - 1 (%)   Basophils Absolute 0.0  0.0 - 0.1 (K/uL)  BASIC METABOLIC PANEL      Component Value Range   Sodium 128 (*) 135 - 145 (mEq/L)   Potassium 2.7 (*) 3.5 - 5.1 (mEq/L)   Chloride 88 (*) 96 - 112 (mEq/L)   CO2 22  19 - 32 (mEq/L)   Glucose, Bld 333 (*) 70 - 99 (mg/dL)   BUN 45 (*) 6 - 23 (mg/dL)   Creatinine, Ser 9.62 (*) 0.50 - 1.10 (mg/dL)   Calcium 8.0 (*) 8.4 - 10.5 (mg/dL)   GFR calc non Af Amer 25 (*) >90 (mL/min)   GFR calc Af Amer 29 (*) >90 (mL/min)  URINALYSIS, ROUTINE W REFLEX MICROSCOPIC      Component Value Range   Color, Urine YELLOW  YELLOW    APPearance CLOUDY (*) CLEAR    Specific Gravity, Urine 1.014  1.005 - 1.030    pH 5.5  5.0 - 8.0    Glucose, UA 250 (*) NEGATIVE (mg/dL)   Hgb urine dipstick NEGATIVE  NEGATIVE    Bilirubin Urine NEGATIVE  NEGATIVE    Ketones, ur NEGATIVE  NEGATIVE (mg/dL)   Protein, ur NEGATIVE  NEGATIVE (mg/dL)   Urobilinogen, UA 0.2  0.0 - 1.0  (mg/dL)   Nitrite NEGATIVE  NEGATIVE    Leukocytes, UA MODERATE (*) NEGATIVE   URINE MICROSCOPIC-ADD ON      Component Value Range   Squamous Epithelial / LPF RARE  RARE    WBC, UA 7-10  <3 (WBC/hpf)   Urine-Other MANY YEAST     Dg Chest 2 View  07/20/2011  *RADIOLOGY REPORT*  Clinical Data: Fall.  Shortness of breath.  CHEST - 2 VIEW 07/20/2011:  Comparison: Two-view chest x-ray 05/30/2011, 04/19/2011, 10/11/2010, 12/20/2009 [facility].  Findings: Cardiac silhouette mildly enlarged but stable.  Thoracic aorta mildly tortuous and atherosclerotic, unchanged.  Hilar and mediastinal contours otherwise unremarkable.  Prominent bronchovascular markings diffusely and mild central peribronchial thickening, unchanged.  Lungs otherwise clear.  No pleural effusions.  Visualized bony thorax intact.  IMPRESSION: Stable mild cardiomegaly and chronic bronchitis.  No acute cardiopulmonary disease.  Original Report Authenticated By: Arnell Sieving, M.D.   Dg Pelvis 1-2 Views  07/20/2011  *RADIOLOGY REPORT*  Clinical Data: Fall  PELVIS - 1-2 VIEW  Comparison: None.  Findings: No acute fracture and no dislocation.  Mild osteopenia.  IMPRESSION: No acute bony pathology.  Original Report Authenticated By: Donavan Burnet, M.D.   Dg Femur Left  07/20/2011  *RADIOLOGY REPORT*  Clinical Data: Fall  LEFT FEMUR - 2 VIEW  Comparison: None.  Findings: No acute fracture and no dislocation.  Unremarkable soft tissues.  IMPRESSION: No acute bony pathology.  Original Report Authenticated By: Donavan Burnet, M.D.    Dg Ankle Complete Left  07/20/2011  *RADIOLOGY REPORT*  Clinical Data: Fall.  Ankle pain.  LEFT ANKLE COMPLETE - 3+ VIEW  Comparison: 04/05/2007  Findings: Hardware in the distal fibula is stable.  Degenerative changes in the ankle joint have progressed.  Osteopenia.  No breakage or loosening of the hardware.  No acute fracture. Degenerative changes in the midfoot are noted.  Minimal spur at the inferior  calcaneus. Soft tissue swelling about the ankle is noted.  IMPRESSION: No acute bony pathology.  Chronic changes.  Original Report Authenticated By: Donavan Burnet, M.D.         MDM  Patient is here with weakness increased from her baseline.  Per the husband and the son the patient did move around the house with assistance of a rolling walker that she gets it on but he previously been able to stand up on her own.  Patient is now unable to stand up on her own and family is unable to care for her since this occurred yesterday.  I do not have an acute cause for the patient's inability to stand today she has no signs of acute fracture on physical examination or on x-rays of her entire left leg where she complained of some pain.  Patient secondarily shows some signs of dehydration with hyponatremia, hypokalemia and some increase in her renal insufficiency.  Patient is receiving IV fluids and potassium supplementation to address these issues.  Patient questionably may have some urinary tract infection as she does endorse some symptoms of dysuria and does have some white blood cells in her urinalysis on a catheter specimen.  Patient has been given a dose of ceftriaxone and a culture has been sent.  Given the patient's inability to stand her family's inability to care for her with her multiple other electrolyte and dehydration abnormalities I believe this patient does warrant admission to the hospitalist service for further management.        Nat Christen, MD 07/20/11 1549  Spoke with Dr. Rito Ehrlich on this pt and he agrees she needs admission but given her low platelet count has asked for an LDH and Ct head to be completed, which I have added on.  Will check out to Dr. Patria Mane.    Nat Christen, MD 07/20/11 [phone]

## 2011-07-20 NOTE — ED Notes (Signed)
Pt family wanted to know about status of  bed.   Talked with the RN and then called the floor to check on room.  Room was being cleaned.  Staff member on second floor said they would call as soon as it is ready.  Updated family on bed status.

## 2011-07-20 NOTE — ED Notes (Signed)
Per EMS: patient fell out of bed yesterday morning and has not been able to walk since the fall. Left hip pain. No obvious rotation or shortness. Edema in all extremities. Nothing taken for pain.

## 2011-07-20 NOTE — ED Notes (Signed)
Lab called for critical 2.6 K on patient.

## 2011-07-20 NOTE — ED Notes (Signed)
AVW:[id number]<XB> Expected date:<BR> Expected time:12:06 PM<BR> Means of arrival:<BR> Comments:<BR> [id number] - 77yoF cant walk, hip pain, fell yesterday

## 2011-07-21 LAB — COMPREHENSIVE METABOLIC PANEL
ALT: 8 U/L (ref 0–35)
CO2: 23 mEq/L (ref 19–32)
Calcium: 7.8 mg/dL — ABNORMAL LOW (ref 8.4–10.5)
Chloride: 100 mEq/L (ref 96–112)
GFR calc Af Amer: 40 mL/min — ABNORMAL LOW (ref >90)
GFR calc non Af Amer: 35 mL/min — ABNORMAL LOW (ref >90)
Glucose, Bld: 159 mg/dL — ABNORMAL HIGH (ref 70–99)
Sodium: 137 mEq/L (ref 135–145)
Total Bilirubin: 0.3 mg/dL (ref 0.3–1.2)

## 2011-07-21 LAB — CBC
HCT: 33.6 % — ABNORMAL LOW (ref 36.0–46.0)
Hemoglobin: 11.3 g/dL — ABNORMAL LOW (ref 12.0–15.0)
Hemoglobin: 10.9 g/dL — ABNORMAL LOW (ref 12.0–15.0)
MCH: 27.4 pg (ref 26.0–34.0)
MCHC: 33.6 g/dL (ref 30.0–36.0)
MCHC: 33.4 g/dL (ref 30.0–36.0)
Platelets: 317 10*3/uL (ref 150–400)
RBC: 4.13 MIL/uL (ref 3.87–5.11)
RDW: 18 % — ABNORMAL HIGH (ref 11.5–15.5)
WBC: 14.6 10*3/uL — ABNORMAL HIGH (ref 4.0–10.5)

## 2011-07-21 LAB — IRON AND TIBC
Iron: 16 ug/dL — ABNORMAL LOW (ref 42–135)
Saturation Ratios: 7 % — ABNORMAL LOW (ref 20–55)
TIBC: 240 ug/dL — ABNORMAL LOW (ref 250–470)
UIBC: 224 ug/dL (ref 125–400)

## 2011-07-21 LAB — GLUCOSE, CAPILLARY: Glucose-Capillary: 146 mg/dL — ABNORMAL HIGH (ref 70–99)

## 2011-07-21 LAB — CLOSTRIDIUM DIFFICILE BY PCR: Toxigenic C. Difficile by PCR: POSITIVE — AB

## 2011-07-21 LAB — FERRITIN: Ferritin: 110 ng/mL (ref 10–291)

## 2011-07-21 LAB — MAGNESIUM: Magnesium: 1.6 mg/dL (ref 1.5–2.5)

## 2011-07-21 LAB — FOLATE: Folate: 15.2 ng/mL

## 2011-07-21 MED ORDER — SODIUM CHLORIDE 0.9 % IV SOLN
INTRAVENOUS | Status: DC
  Administered 2011-07-21: 18:00:00 via INTRAVENOUS

## 2011-07-21 MED ORDER — METRONIDAZOLE IN NACL 5-0.79 MG/ML-% IV SOLN
500.0000 mg | Freq: Three times a day (TID) | INTRAVENOUS | Status: DC
  Administered 2011-07-21 – 2011-07-24 (×9): 500 mg via INTRAVENOUS
  Filled 2011-07-21 (×12): qty 100

## 2011-07-21 NOTE — Evaluation (Signed)
Occupational Therapy Evaluation Patient Details Name: Glenda Gonzalez MRN: [id number] DOB: [birth year] Today's Date: 07/21/2011  Problem List:  Patient Active Problem List  Diagnoses  . DM  . GOUT  . DEMENTIA, MILD  . TREMOR, ESSENTIAL  . HYPERTENSION  . CAD  . CONGESTIVE HEART FAILURE UNSPECIFIED  . CLAUDICATION  . GERD  . OSTEOARTHRITIS  . Generalized weakness  . Fall  . Thrombocytopenia  . Hypokalemia  . Hyponatremia  . Acute diarrhea    Past Medical History:  Past Medical History  Diagnosis Date  . Diabetes mellitus   . GERD (gastroesophageal reflux disease)   . Hypertension   . Diverticulitis   . Gout   . Essential and other specified forms of tremor   . Dementia   . CAD (coronary artery disease)   . CHF (congestive heart failure)     Diastolic dysfunction   Past Surgical History:  Past Surgical History  Procedure Date  . Dilation and curettage of uterus   . Tumor removal     OT Assessment/Plan/Recommendation OT Assessment Clinical Impression Statement: Pt is a 78 yo female who presents with a significant decline in BADL/functional mobility performance. Skilled OT recommended to maximize I w/ADLs in prep for d/c to next venue of care. Pt would need 24/7 A if to return home along with HHOT and aide. OT Recommendation/Assessment: Patient will need skilled OT in the acute care venue OT Problem List: Decreased activity tolerance;Decreased safety awareness;Decreased cognition;Decreased knowledge of use of DME or AE Barriers to Discharge: Decreased caregiver support OT Therapy Diagnosis : Generalized weakness OT Plan OT Frequency: Min 2X/week OT Treatment/Interventions: Self-care/ADL training;Therapeutic activities;DME and/or AE instruction;Patient/family education OT Recommendation Follow Up Recommendations: Skilled nursing facility. Pt would require Supervision/Assistance - 24 hour, HHOT and aide if returning home with family. Equipment Recommended: None  recommended by OT Individuals Consulted Consulted and Agree with Results and Recommendations: Patient unable/family or caregiver not available OT Goals Acute Rehab OT Goals OT Goal Formulation: Patient unable to participate in goal setting Time For Goal Achievement: 2 weeks ADL Goals Pt Will Perform Grooming: with supervision;Sitting, edge of bed;Unsupported (X 3 tasks to improve activity tolerance.) ADL Goal: Grooming - Progress: Goal set today Pt Will Transfer to Toilet: with mod assist;3-in-1;Stand pivot transfer ADL Goal: Toilet Transfer - Progress: Goal set today Pt Will Perform Toileting - Clothing Manipulation: with mod assist;Standing ADL Goal: Toileting - Clothing Manipulation - Progress: Goal set today Pt Will Perform Toileting - Hygiene: with mod assist;Sit to stand from 3-in-1/toilet ADL Goal: Toileting - Hygiene - Progress: Goal set today  OT Evaluation Precautions/Restrictions  Precautions Precautions: Fall Restrictions Weight Bearing Restrictions: No Prior Functioning Home Living Lives With: Spouse Receives Help From: Family Type of Home: House Home Layout: One level Home Access: Stairs to enter Entrance Stairs-Rails: None Entrance Stairs-Number of Steps: 3 Bathroom Shower/Tub: Tub/shower unit;Other (comment) (Pt only spongebathes.) Bathroom Toilet: Standard Home Adaptive Equipment: Bedside commode/3-in-1;Walker - rolling;Straight cane Prior Function Level of Independence: Needs assistance with ADLs;Requires assistive device for independence ADL ADL Grooming: Simulated;Set up Where Assessed - Grooming: Sitting, bed;Unsupported Upper Body Bathing: Simulated;Moderate assistance Where Assessed - Upper Body Bathing: Sitting, bed;Unsupported Lower Body Bathing: Simulated;+2 Total assistance;Comment for patient % (Pt 0%) Where Assessed - Lower Body Bathing: Sit to stand from bed Upper Body Dressing: Simulated;Moderate assistance Where Assessed - Upper Body  Dressing: Sitting, bed;Unsupported Lower Body Dressing: Simulated;+2 Total assistance;Comment for patient % (Pt 0%) Where Assessed - Lower Body Dressing: Sit to stand  from bed Toilet Transfer: Not assessed Toilet Transfer Method: Not assessed Toileting - Clothing Manipulation: +2 Total assistance;Comment for patient % (Pt 0%) Where Assessed - Toileting Clothing Manipulation: Standing Toileting - Hygiene: +2 Total assistance;Comment for patient % (Pt 0%) Where Assessed - Toileting Hygiene: Standing Tub/Shower Transfer: Not assessed Tub/Shower Transfer Method: Not assessed ADL Comments: Pt very lethargic, somewhat confused. Max time and effort needed for all mobility. Vision/Perception    Cognition Cognition Arousal/Alertness: Lethargic Overall Cognitive Status: No family/caregiver present to determine baseline cognitive functioning Orientation Level: Oriented to person;Disoriented to place;Disoriented to time Cognition - Other Comments: Pt was unable to state what city she was in. Very slow to process. Sensation/Coordination   Extremity Assessment RUE Assessment RUE Assessment: Exceptions to [facility] RUE AROM (degrees) Overall AROM Right Upper Extremity: Within functional limits for tasks performed RUE Strength RUE Overall Strength: Deficits RUE Overall Strength Comments: 3+/5 grossly LUE Assessment LUE Assessment: Exceptions to WFL LUE AROM (degrees) Overall AROM Left Upper Extremity: Within functional limits for tasks assessed LUE Strength LUE Overall Strength: Deficits LUE Overall Strength Comments: 3+/5 grossly Mobility  Bed Mobility Bed Mobility: Yes Supine to Sit: 1: +2 Total assist;Patient percentage (comment);With rails;HOB elevated (Comment degrees) (Pt <10%) Supine to Sit Details (indicate cue type and reason): manual facilitation needed to utilize bed rail.  Sit to Supine: 1: +2 Total assist;Patient percentage (comment);HOB flat (Pt < 10%) Sit to Supine - Details  (indicate cue type and reason): physical assist needed for trunk and LEs. Transfers Transfers: Yes Sit to Stand: 1: +2 Total assist;Patient percentage (comment);From elevated surface;With upper extremity assist;From bed Sit to Stand Details (indicate cue type and reason): Pt barely able to stand upright. Unable to complete transfer to chair. Stand to Sit: 1: +2 Total assist;Patient percentage (comment);To bed;With upper extremity assist (Pt < 10%) Exercises   End of Session OT - End of Session Equipment Utilized During Treatment: Gait belt Activity Tolerance: Patient limited by fatigue Patient left: in bed;with call bell in reach Nurse Communication: Mobility status for transfers General Behavior During Session: Lethargic Cognition: Impaired, at baseline   Kass Herberger A, OTR/L [phone] 07/21/2011, 2:41 PM

## 2011-07-21 NOTE — Progress Notes (Signed)
PROGRESS NOTE  Glenda Gonzalez:[id number] DOB: 09-28-1933 DOA: 07/20/2011 PCP: Billee Cashing, MD, MD  Brief narrative: 78 year old [name] admitted 2/25 with weakness, fall onto left side on admission, increasing forgetfulness, loose stools  Past medical history: Pancreatitis in 2012, gout, biliary dyskinesia, type 2 diabetes mellitus-last A1c in record 9.6, reflux, dementia, diastolic heart failure (now compensated) EF 55-60% with grade 1 dysfunction, morbid obesity see daily stage 2-3 history of diverticulosis, osteoarthritis  Consultants:  Dr. Sylvan Cheese  Procedures:  Femur x-ray tests +  pelvic x-ray 2/24 = no acute bony abnormality  CT head 2/24 = remote cerebellar and right basal ganglia infarcts, no evidence of acute intracranial abnormality.l  Antibiotics:  None   Subjective  Arousable, but confused. Nursing reports 3-4 stools which were watery and foul smelling. C. difficile sent but pending Able to tell me the names of relatives in the room, not able to tell me the place time person   Objective    Interim History: History of present illness reviewed nursing notes reviewed   Objective: Filed Vitals:   07/20/11 1607 07/20/11 1925 07/20/11 2056 07/21/11 0726  BP: 107/67  120/77 99/67  Pulse: 79  74 86  Temp: 97.7 F (36.5 C)  98.2 F (36.8 C) 98.3 F (36.8 C)  TempSrc: Oral  Oral Oral  Resp: 18  18 18   Height:   5' 1.5" (1.562 m)   Weight:   84.9 kg (187 lb 2.7 oz)   SpO2: 100% 100% 95% 98%    Intake/Output Summary (Last 24 hours) at 07/21/11 1342 Last data filed at 07/21/11 0900  Gross per 24 hour  Intake 1046.67 ml  Output    402 ml  Net 644.67 ml    Exam:  General: Alert and oriented times 1  Cardiovascular: S1-S2 no murmur rub or gallop, telemetry equals  Sinus with a couple of ectopic beats Respiratory: Clinically clear no added sound Abdomen: Soft nontender nondistended Skin no lower extremity edema Neuro  slightly confused. See above  Data Reviewed: Basic Metabolic Panel:  Lab 07/21/11 8119 07/20/11 1254  NA 137 128*  K 3.5 2.7*  CL 100 88*  CO2 23 22  GLUCOSE 159* 333*  BUN 36* 45*  CREATININE 1.44* 1.86*  CALCIUM 7.8* 8.0*  MG 1.6 --  PHOS -- --   Liver Function Tests:  Lab 07/21/11 0429  AST 18  ALT 8  ALKPHOS 133*  BILITOT 0.3  PROT 6.2  ALBUMIN 2.2*   No results found for this basename: LIPASE:5,AMYLASE:5 in the last 168 hours No results found for this basename: AMMONIA:5 in the last 168 hours CBC:  Lab 07/21/11 0429 07/20/11 2135 07/20/11 1254  WBC 19.3* 13.4* 14.6*  NEUTROABS -- 11.0* 12.9*  HGB 10.9* 10.0* 11.3*  HCT 32.6* 30.0* 33.6*  MCV 81.9 81.3 81.4  PLT 317 315 QUESTIONABLE RESULTS, RECOMMEND RECOLLECT TO VERIFY   Cardiac Enzymes: No results found for this basename: CKTOTAL:5,CKMB:5,CKMBINDEX:5,TROPONINI:5 in the last 168 hours BNP: No components found with this basename: POCBNP:5 CBG:  Lab 07/21/11 1144 07/21/11 0756 07/20/11 2221  GLUCAP 146* 204* 218*    No results found for this or any previous visit (from the past 240 hour(s)).   Studies: Dg Chest 2 View  07/20/2011  *RADIOLOGY REPORT*  Clinical Data: Fall.  Shortness of breath.  CHEST - 2 VIEW 07/20/2011:  Comparison: Two-view chest x-ray 05/30/2011, 04/19/2011, 10/11/2010, 12/20/2009 [facility].  Findings: Cardiac silhouette mildly enlarged but stable.  Thoracic aorta mildly  tortuous and atherosclerotic, unchanged.  Hilar and mediastinal contours otherwise unremarkable.  Prominent bronchovascular markings diffusely and mild central peribronchial thickening, unchanged.  Lungs otherwise clear.  No pleural effusions.  Visualized bony thorax intact.  IMPRESSION: Stable mild cardiomegaly and chronic bronchitis.  No acute cardiopulmonary disease.  Original Report Authenticated By: Arnell Sieving, M.D.   Dg Pelvis 1-2 Views  07/20/2011  *RADIOLOGY REPORT*  Clinical Data: Fall  PELVIS -  1-2 VIEW  Comparison: None.  Findings: No acute fracture and no dislocation.  Mild osteopenia.  IMPRESSION: No acute bony pathology.  Original Report Authenticated By: Donavan Burnet, M.D.   Dg Femur Left  07/20/2011  *RADIOLOGY REPORT*  Clinical Data: Fall  LEFT FEMUR - 2 VIEW  Comparison: None.  Findings: No acute fracture and no dislocation.  Unremarkable soft tissues.  IMPRESSION: No acute bony pathology.  Original Report Authenticated By: Donavan Burnet, M.D.   Dg Tibia/fibula Right  06/22/2011  *RADIOLOGY REPORT*  Clinical Data:  Fall, lower extremity pain  RIGHT TIBIA AND FIBULA - 2 VIEW  Comparison: None.  Findings: No evidence of fracture or dislocation of the tibia or fibula.  The ankle joint appears normal.  IMPRESSION: No evidence of fracture.  Original Report Authenticated By: Genevive Bi, M.D.   Dg Ankle Complete Left  07/20/2011  *RADIOLOGY REPORT*  Clinical Data: Fall.  Ankle pain.  LEFT ANKLE COMPLETE - 3+ VIEW  Comparison: 04/05/2007  Findings: Hardware in the distal fibula is stable.  Degenerative changes in the ankle joint have progressed.  Osteopenia.  No breakage or loosening of the hardware.  No acute fracture. Degenerative changes in the midfoot are noted.  Minimal spur at the inferior calcaneus. Soft tissue swelling about the ankle is noted.  IMPRESSION: No acute bony pathology.  Chronic changes.  Original Report Authenticated By: Donavan Burnet, M.D.   Ct Head Wo Contrast  07/20/2011  *RADIOLOGY REPORT*  Clinical Data: 78 year old female with altered mental status following fall.  Also unable to stand.  CT HEAD WITHOUT CONTRAST  Technique:  Contiguous axial images were obtained from the base of the skull through the vertex without contrast.  Comparison: 04/02/2010  Findings:   Moderate chronic small vessel white matter ischemic changes are again identified. Remote cerebellar and right basal ganglia infarcts are again noted.  No acute intracranial abnormalities are  identified, including mass lesion or mass effect, hydrocephalus, extra-axial fluid collection, midline shift, hemorrhage, or acute infarction.  The visualized bony calvarium is unremarkable.  IMPRESSION: No evidence of acute intracranial abnormality.  Remote cerebellar and right basal ganglia infarcts.  Moderate chronic small vessel white matter ischemic changes  Original Report Authenticated By: Rosendo Gros, M.D.   Dg Knee Complete 4 Views Right  06/22/2011  *RADIOLOGY REPORT*  Clinical Data: Fall, right knee pain  RIGHT KNEE - COMPLETE 4+ VIEW  Comparison: Plain film 10/13/2010  Findings: No evidence of fracture or dislocation of the right knee. There is a suprapatellar joint effusion which is similar to prior.  IMPRESSION:  1.  No evidence of fracture or dislocation. 2.  Moderate sized suprapatellar joint effusion. This is similar to comparison exam.  Original Report Authenticated By: Genevive Bi, M.D.   Dg Hand Complete Right  07/08/2011  *RADIOLOGY REPORT*  Clinical Data: 78 year old female with fall.  Pain and swelling.  RIGHT HAND - COMPLETE 3+ VIEW  Comparison: 06/26/2011.  Findings: Distal radius and ulna appear stable.  Remote ulnar styloid fracture.  Stable carpal bone alignment.  Subtle osseous irregularity along the radial aspect of the second ray a involving the distal proximal phalanx and radial base of the middle phalanx, otherwise no first ray fracture. No other acute osseous abnormality.  IMPRESSION:  1.  Possible small avulsion fragments about the radial second PIP joint. 2.  Otherwise no acute osseous abnormality identified in the right hand.  Original Report Authenticated By: Harley Hallmark, M.D.   Dg Hand Complete Right  06/26/2011  *RADIOLOGY REPORT*  Clinical Data: Right hand pain of the second metacarpal phalangeal joint  RIGHT HAND - COMPLETE 3+ VIEW  Comparison: None.  Findings: Positioning is markedly suboptimal due to patient pain. There is apparent soft tissue prominence  over the second digit.  No radiopaque foreign body.  No fracture or dislocation is identified. Soft tissue edema is noted over the dorsum of the hand.  IMPRESSION: Soft tissue swelling/edema over the dorsum of the hand and at the second digit.  No fracture or dislocation is identified.  Findings could represent ecchymosis or potentially cellulitis.  If there is strong clinical suspicion for osteomyelitis, MRI with contrast would be the most sensitive way to detect this entity.  Original Report Authenticated By: Harrel Lemon, M.D.    Scheduled Meds:   . sodium chloride   Intravenous Once  . bimatoprost  1 drop Both Eyes BID  . cefTRIAXone (ROCEPHIN)  IV  1 g Intravenous Once  . cefTRIAXone (ROCEPHIN)  IV  1 g Intravenous Q24H  . cloNIDine  0.2 mg Oral TID  . donepezil  10 mg Oral QHS  . fentaNYL  50 mcg Intravenous Once  . insulin aspart  0-15 Units Subcutaneous TID WC  . insulin aspart  0-5 Units Subcutaneous QHS  . insulin glargine  5 Units Subcutaneous QHS  . mulitivitamin with minerals  1 tablet Oral Daily  . potassium chloride  40 mEq Oral Once  . potassium chloride  40 mEq Oral Once  . sodium chloride  1,000 mL Intravenous Once  . sodium chloride  3 mL Intravenous Q12H   Continuous Infusions:   . 0.9 % NaCl with KCl 20 mEq / L 100 mL/hr (07/20/11 2256)     Assessment/Plan: 1. Diarrhea-possible C. difficile versus diverticulitis. Await C. difficile PCR. If positive start treatment, if not we will empirically cover with antibiotics for a diverticulitis-Flagyll and Cipro.  Start Flagyll now. Follow a.m. CBC as her white count is trended up from 14-19.0. 2. Possible urinary tract infection-urine culture pending. Hold antibiotics at present time. 3. Georgeann Oppenheim fall-regularly related to primary disease. CT scan head negative. Physical therapy assessment and 4. Gout-husband states patient was on recent treatment for this with an antibiotic. Review of chart reveals patient was given  clindamycin 1/31 for possible cellulitis of the arm. Noted also she's had multiple evaluations in the emergency room, sacrum being 07/08/11 for gout of the right arm. There is him slightly high pretest probability of her having C. difficile especially fill the antibiotic. We will cover with Flagyl as per above.  Uric acid was 19.5-treatment with steroids limited by possible C. difficile, and renal insufficiency for gout medication 5. 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 Repeat labs today show mild renal insufficiency, which seems to be trending towards baseline on IV fluid. Continue every third 24 hours and reassess. Continue potassium 6. Hypertension-usually takes clonidine 0.2 mg 3 times a day at home, Lasix 40 twice a day-these will be held until hypotension resolves 7. Hypotension- likely related to diarrhea  state. See above 8. Dementia- continue Aricept 10 mg each bedtime plan 9.  Diabetes-blood sugars 146-204, to provide taken usually at home. Now on sliding scale insulin and moderately well controlled. 10. Compensated CHF (EF 50 5/6 and)-stable hold Lasix for now.  Code Status: Full Family Communication: Discussed briefly with husband.  Will review with more results Disposition Plan: Pending Pt/Ot input.   Pleas Koch, MD  [facility] Pager [phone] 07/21/2011, 1:42 PM    LOS: 1 day

## 2011-07-21 NOTE — Progress Notes (Addendum)
INITIAL ADULT NUTRITION ASSESSMENT Date: 07/21/2011   Time: 1:48 PM Reason for Assessment: consult  ASSESSMENT: Female 78 y.o.  Dx: Thrombocytopenia  Hx:  Past Medical History  Diagnosis Date  . Diabetes mellitus   . GERD (gastroesophageal reflux disease)   . Hypertension   . Diverticulitis   . Gout   . Essential and other specified forms of tremor   . Dementia   . CAD (coronary artery disease)   . CHF (congestive heart failure)     Diastolic dysfunction   Past Surgical History  Procedure Date  . Dilation and curettage of uterus   . Tumor removal     Related Meds:  Scheduled Meds:   . sodium chloride   Intravenous Once  . bimatoprost  1 drop Both Eyes BID  . cefTRIAXone (ROCEPHIN)  IV  1 g Intravenous Once  . cefTRIAXone (ROCEPHIN)  IV  1 g Intravenous Q24H  . cloNIDine  0.2 mg Oral TID  . donepezil  10 mg Oral QHS  . fentaNYL  50 mcg Intravenous Once  . insulin aspart  0-15 Units Subcutaneous TID WC  . insulin aspart  0-5 Units Subcutaneous QHS  . insulin glargine  5 Units Subcutaneous QHS  . mulitivitamin with minerals  1 tablet Oral Daily  . potassium chloride  40 mEq Oral Once  . potassium chloride  40 mEq Oral Once  . sodium chloride  1,000 mL Intravenous Once  . sodium chloride  3 mL Intravenous Q12H   Continuous Infusions:   . 0.9 % NaCl with KCl 20 mEq / L 100 mL/hr (07/20/11 2256)   PRN Meds:.acetaminophen, acetaminophen, albuterol, cyclobenzaprine, ondansetron (ZOFRAN) IV, ondansetron, traMADol, zolpidem   Ht: 5' 1.5" (156.2 cm)  Wt: 187 lb 2.7 oz (84.9 kg) (Bed scale)  Ideal Wt: 63 kg % Ideal Wt: 133%  Usual Wt: 205 lbs % Usual Wt: 91%  Body mass index is 34.80 kg/(m^2).  Food/Nutrition Related Hx:   Pt admitted with weakness s/p fall on hip yesterday.  Pt also had a fall approximately 2 weeks ago with resulting left hand pain.   Hx of solid food dysphagia.  In the past she has not worn her dentures and has difficulty eating solid foods  when she doesn't wear them.  Last addressed with Dr. Christella Hartigan in Dec 2012. Pt is sleeping soundly in bed. MD at bedside. Tray at bedside with 0% consumed.  Per NT, has refused all meals so far today. Increased stool output. Stool culture being sent off for c. Diff, results not yet available.  Wt hx shows pt with 8.7% wt loss in in 2 months, significant. Given wt loss, decreased PO intake, and increased weakness leading to falls pt qualifies for severe protein-calorie malnutrition of chronic disease (dementia).  Labs:  CMP     Component Value Date/Time   NA 137 07/21/2011 0429   K 3.5 07/21/2011 0429   CL 100 07/21/2011 0429   CO2 23 07/21/2011 0429   GLUCOSE 159* 07/21/2011 0429   BUN 36* 07/21/2011 0429   CREATININE 1.44* 07/21/2011 0429   CALCIUM 7.8* 07/21/2011 0429   PROT 6.2 07/21/2011 0429   ALBUMIN 2.2* 07/21/2011 0429   AST 18 07/21/2011 0429   ALT 8 07/21/2011 0429   ALKPHOS 133* 07/21/2011 0429   BILITOT 0.3 07/21/2011 0429   GFRNONAA 35* 07/21/2011 0429   GFRAA 40* 07/21/2011 0429    Intake:  0% Output:  Diarrhea  Intake/Output Summary (Last 24 hours) at 07/21/11 1359  Last data filed at 07/21/11 0900  Gross per 24 hour  Intake 1046.67 ml  Output    402 ml  Net 644.67 ml     Diet Order: Carb Control  Supplements/Tube Feeding: none at this time  IVF:    0.9 % NaCl with KCl 20 mEq / L Last Rate: 100 mL/hr (07/20/11 2256)    Estimated Nutritional Needs:   Kcal: 1570-1700 kcal Protein: 68-81g Fluid: >2.0 L/day  NUTRITION DIAGNOSIS: -Inadequate oral intake (NI-2.1).  Status: Ongoing  RELATED TO: weakness, confusion  AS EVIDENCE BY: pt refusing meals, 0% intake since admission  MONITORING/EVALUATION(Goals): 1.  Food/Beverage; pt to improve intake, limit refused meals.  EDUCATION NEEDS: -No education needs identified at this time  INTERVENTION: 1.  Meals/snacks; encourage intake as able.  Pt refusing all meals at this time.  Per chart review, pt with h/o  dysphagia- inability to chew solid foods.  She has dentures but has not worn them consistently in the past.  Dietitian #: [phone]  DOCUMENTATION CODES Per approved criteria  -Severe protein-calorie malnutrition of chronic illness    Loyce Dys [facility] 07/21/2011, 1:48 PM

## 2011-07-21 NOTE — Plan of Care (Signed)
Problem: Phase II Progression Outcomes Goal: Progress activity as tolerated unless otherwise ordered Outcome: Not Progressing Pt reported dizziness and weakness with supine to sit and unable to stand upright today.

## 2011-07-21 NOTE — Evaluation (Signed)
Physical Therapy Evaluation Patient Details Name: Glenda Gonzalez MRN: [id number] DOB: Sep 29, 1933 Today's Date: 07/21/2011  Problem List:  Patient Active Problem List  Diagnoses  . DM  . GOUT  . DEMENTIA, MILD  . TREMOR, ESSENTIAL  . HYPERTENSION  . CAD  . CONGESTIVE HEART FAILURE UNSPECIFIED  . CLAUDICATION  . GERD  . OSTEOARTHRITIS  . Generalized weakness  . Fall  . Thrombocytopenia  . Hypokalemia  . Hyponatremia  . Acute diarrhea    Past Medical History:  Past Medical History  Diagnosis Date  . Diabetes mellitus   . GERD (gastroesophageal reflux disease)   . Hypertension   . Diverticulitis   . Gout   . Essential and other specified forms of tremor   . Dementia   . CAD (coronary artery disease)   . CHF (congestive heart failure)     Diastolic dysfunction   Past Surgical History:  Past Surgical History  Procedure Date  . Dilation and curettage of uterus   . Tumor removal     PT Assessment/Plan/Recommendation PT Assessment Clinical Impression Statement: Pt admitted for thrombocytopenia.  Pt would benefit from acute PT services in order to improve independence with bed mobility, transfers, and ambulation as well as increase activity tolerance.  Pt unable to stand today and will need increased assist upon d/c. PT Recommendation/Assessment: Patient will need skilled PT in the acute care venue PT Problem List: Decreased activity tolerance;Decreased strength;Decreased balance;Decreased mobility;Decreased knowledge of use of DME PT Therapy Diagnosis : Generalized weakness;Difficulty walking PT Plan PT Frequency: Min 3X/week PT Treatment/Interventions: DME instruction;Gait training;Functional mobility training;Therapeutic activities;Therapeutic exercise;Balance training;Patient/family education PT Recommendation Recommendations for Other Services:  (seen with OT) Follow Up Recommendations: Skilled nursing facility Equipment Recommended: Defer to next venue PT  Goals  Acute Rehab PT Goals PT Goal Formulation: With patient Time For Goal Achievement: 2 weeks Pt will go Supine/Side to Sit: with mod assist PT Goal: Supine/Side to Sit - Progress: Goal set today Pt will go Sit to Stand: with mod assist PT Goal: Sit to Stand - Progress: Goal set today Pt will go Stand to Sit: with mod assist PT Goal: Stand to Sit - Progress: Goal set today Pt will Transfer Bed to Chair/Chair to Bed: with mod assist PT Transfer Goal: Bed to Chair/Chair to Bed - Progress: Goal set today Pt will Ambulate: 1 - 15 feet;with mod assist;with rolling walker PT Goal: Ambulate - Progress: Goal set today  PT Evaluation Precautions/Restrictions  Precautions Precautions: Fall Prior Functioning  Home Living Lives With: Spouse Receives Help From: Family Type of Home: House Home Layout: One level Home Access: Stairs to enter Entrance Stairs-Rails: None Entrance Stairs-Number of Steps: 3 Bathroom Shower/Tub: Tub/shower unit;Other (comment) (Pt only spongebathes.) Bathroom Toilet: Standard Home Adaptive Equipment: Bedside commode/3-in-1;Walker - rolling;Straight cane Prior Function Level of Independence: Needs assistance with ADLs;Requires assistive device for independence Comments: Pt reports using straight cane for mobility. Cognition Cognition Arousal/Alertness: Lethargic Overall Cognitive Status: No family/caregiver present to determine baseline cognitive functioning Orientation Level: Oriented to person;Disoriented to place;Disoriented to time Cognition - Other Comments: Pt was unable to state what city she was in. Very slow to process. Sensation/Coordination   Extremity Assessment RUE Assessment RUE Assessment: Exceptions to [facility] RUE AROM (degrees) Overall AROM Right Upper Extremity: Within functional limits for tasks performed RUE Strength RUE Overall Strength: Deficits RUE Overall Strength Comments: 3+/5 grossly LUE Assessment LUE Assessment: Exceptions to  WFL LUE AROM (degrees) Overall AROM Left Upper Extremity: Within functional limits for  tasks assessed LUE Strength LUE Overall Strength: Deficits LUE Overall Strength Comments: 3+/5 grossly RLE Strength RLE Overall Strength Comments: grossly 2+/5 throughout per functional observation LLE Strength LLE Overall Strength Comments: grossly 2+/5 throughout per functional observation Mobility (including Balance) Bed Mobility Bed Mobility: Yes Rolling Left: 3: Mod assist Rolling Left Details (indicate cue type and reason): verbal and tactile cue to guide R UE over to rail, assist mostly for lower body, roll to place bed pan (performed roll end of session and nursing tech aware to check on pt to remove) Supine to Sit: 1: +2 Total assist;Patient percentage (comment);With rails;HOB elevated (Comment degrees) (Pt <10%) Supine to Sit Details (indicate cue type and reason): pt<10%, brought R UE over to rail, increased assist due to weakness, pt initiates movement but unable to continue with transfer Sit to Supine: 1: +2 Total assist;Patient percentage (comment);HOB flat (Pt < 10%) Sit to Supine - Details (indicate cue type and reason): pt<10%, increased assist for trunk and bringing LEs onto bed Transfers Sit to Stand: 1: +2 Total assist;Patient percentage (comment);From elevated surface;With upper extremity assist;From bed Sit to Stand Details (indicate cue type and reason): pt<10%, pt able to perform forward lean however too weak to stand upright, with RW, pt also report dizziness which was not resolving so assisted back to supine Stand to Sit: 1: +2 Total assist;Patient percentage (comment);To bed;With upper extremity assist;To elevated surface (Pt < 10%) Stand to Sit Details: pt= 50%, assist to control descent to elevated surface Ambulation/Gait Ambulation/Gait: No  Balance Balance Assessed: Yes Static Sitting Balance Static Sitting - Balance Support: Bilateral upper extremity supported;Feet  supported Static Sitting - Level of Assistance: 5: Stand by assistance;4: Min assist Static Sitting - Comment/# of Minutes: for 2 minutes supervision then minA after due to fatigue Exercise    End of Session PT - End of Session Equipment Utilized During Treatment: Gait belt Activity Tolerance: Patient limited by fatigue Patient left: in bed;with call bell in reach General Behavior During Session: Lethargic Cognition: Impaired, at baseline  Rutha Melgoza,KATHrine E 07/21/2011, 3:32 PM Pager: [phone]

## 2011-07-22 LAB — CBC
HCT: 28.8 % — ABNORMAL LOW (ref 36.0–46.0)
MCH: 26.9 pg (ref 26.0–34.0)
MCV: 82.3 fL (ref 78.0–100.0)
RDW: 18.3 % — ABNORMAL HIGH (ref 11.5–15.5)
WBC: 13.3 10*3/uL — ABNORMAL HIGH (ref 4.0–10.5)

## 2011-07-22 LAB — URINE CULTURE

## 2011-07-22 LAB — GLUCOSE, CAPILLARY
Glucose-Capillary: 44 mg/dL — CL (ref 70–99)
Glucose-Capillary: 64 mg/dL — ABNORMAL LOW (ref 70–99)
Glucose-Capillary: 79 mg/dL (ref 70–99)
Glucose-Capillary: 248 mg/dL — ABNORMAL HIGH (ref 70–99)
Glucose-Capillary: 345 mg/dL — ABNORMAL HIGH (ref 70–99)

## 2011-07-22 LAB — BASIC METABOLIC PANEL
CO2: 22 mEq/L (ref 19–32)
Chloride: 101 mEq/L (ref 96–112)
Creatinine, Ser: 1.1 mg/dL (ref 0.50–1.10)
Glucose, Bld: 69 mg/dL — ABNORMAL LOW (ref 70–99)

## 2011-07-22 MED ORDER — FLUCONAZOLE IN SODIUM CHLORIDE 200-0.9 MG/100ML-% IV SOLN
200.0000 mg | INTRAVENOUS | Status: DC
  Administered 2011-07-22 – 2011-07-23 (×2): 200 mg via INTRAVENOUS
  Filled 2011-07-22 (×3): qty 100

## 2011-07-22 MED ORDER — KCL-LACTATED RINGERS-D5W 20 MEQ/L IV SOLN
INTRAVENOUS | Status: DC
  Administered 2011-07-22: 14:00:00 via INTRAVENOUS
  Administered 2011-07-23: 50 mL/h via INTRAVENOUS
  Filled 2011-07-22 (×3): qty 1000

## 2011-07-22 MED ORDER — CLONIDINE HCL 0.1 MG PO TABS
0.1000 mg | ORAL_TABLET | Freq: Four times a day (QID) | ORAL | Status: DC | PRN
  Filled 2011-07-22: qty 1

## 2011-07-22 NOTE — Progress Notes (Signed)
CBG 44 at 0730, patient given juice and snack rechecked  At 0805 CBG 64, patient given another snack with breakfast and rechecked at 0900 CBG 79. Continuing to monitor closely.

## 2011-07-22 NOTE — Progress Notes (Signed)
PROGRESS NOTE  Glenda Gonzalez ZOX:[id number] DOB: 03/13/1934 DOA: 07/20/2011 PCP: Billee Cashing, MD, MD  Brief narrative: 78 year old [name] admitted 2/25 with weakness, fall onto left side on admission, increasing forgetfulness, loose stools  Past medical history: Pancreatitis in 2012, gout, biliary dyskinesia, type 2 diabetes mellitus-last A1c in record 9.6, reflux, dementia, diastolic heart failure (now compensated) EF 55-60% with grade 1 dysfunction, morbid obesity see daily stage 2-3 history of diverticulosis, osteoarthritis  Consultants:  Dr. Sylvan Cheese  Procedures:  Femur x-ray tests +  pelvic x-ray 2/24 = no acute bony abnormality.  CT head 2/24 = remote cerebellar and right basal ganglia infarcts, no evidence of acute intracranial abnormality.  Antibiotics:  Ceftriaxone 2/24>>>2/25  Flagyl 2/25>>>   Subjective  Arousable, complaining of abdominal pain cramping. Only eating bites of food and not tolerate much by mouth. Is occasionally nauseated.   Objective   Interim History: Hypoglycemic this morning into the 40s.  Objective: Filed Vitals:   07/21/11 2041 07/22/11 0500 07/22/11 0609 07/22/11 0859  BP: 103/71  110/70 110/76  Pulse: 71  71 76  Temp: 97.4 F (36.3 C)  98.7 F (37.1 C)   TempSrc: Oral  Oral   Resp: 18  20 20   Height:      Weight:  86.5 kg (190 lb 11.2 oz)    SpO2: 98%  97%     Intake/Output Summary (Last 24 hours) at 07/22/11 1210 Last data filed at 07/22/11 1042  Gross per 24 hour  Intake   1430 ml  Output    200 ml  Net   1230 ml    Exam:  General: Alert and oriented times 1  Cardiovascular: S1-S2 no murmur rub or gallop, telemetry equals  Sinus with a couple of ectopic beats Respiratory: Clinically clear no added sound Abdomen: Soft nontender nondistended Skin no lower extremity edema Neuro slightly confused. See above  Data Reviewed: Basic Metabolic Panel:  Lab 07/22/11 8119 07/21/11 0429  07/20/11 1254  NA 134* 137 128*  K 3.2* 3.5 --  CL 101 100 88*  CO2 22 23 22   GLUCOSE 69* 159* 333*  BUN 26* 36* 45*  CREATININE 1.10 1.44* 1.86*  CALCIUM 8.1* 7.8* 8.0*  MG -- 1.6 --  PHOS -- -- --   Liver Function Tests:  Lab 07/21/11 0429  AST 18  ALT 8  ALKPHOS 133*  BILITOT 0.3  PROT 6.2  ALBUMIN 2.2*   No results found for this basename: LIPASE:5,AMYLASE:5 in the last 168 hours No results found for this basename: AMMONIA:5 in the last 168 hours CBC:  Lab 07/22/11 0450 07/21/11 0429 07/20/11 2135 07/20/11 1254  WBC 13.3* 19.3* 13.4* 14.6*  NEUTROABS -- -- 11.0* 12.9*  HGB 9.4* 10.9* 10.0* 11.3*  HCT 28.8* 32.6* 30.0* 33.6*  MCV 82.3 81.9 81.3 81.4  PLT 312 317 315 QUESTIONABLE RESULTS, RECOMMEND RECOLLECT TO VERIFY   Cardiac Enzymes: No results found for this basename: CKTOTAL:5,CKMB:5,CKMBINDEX:5,TROPONINI:5 in the last 168 hours BNP: No components found with this basename: POCBNP:5 CBG:  Lab 07/22/11 1132 07/22/11 0857 07/22/11 0806 07/22/11 0738 07/21/11 2059  GLUCAP 248* 79 64* 44* 144*    Recent Results (from the past 240 hour(s))  URINE CULTURE     Status: Normal   Collection Time   07/20/11  2:04 PM      Component Value Range Status Comment   Specimen Description URINE, CATHETERIZED   Final    Special Requests NONE   Final    Culture  Setup Time [id number]   Final    Colony Count >=100,000 COLONIES/ML   Final    Culture YEAST   Final    Report Status 07/22/2011 FINAL   Final   CLOSTRIDIUM DIFFICILE BY PCR     Status: Abnormal   Collection Time   07/21/11  9:06 AM      Component Value Range Status Comment   C difficile by pcr POSITIVE (*) NEGATIVE  Final      Studies: Dg Chest 2 View  07/20/2011  *RADIOLOGY REPORT*  Clinical Data: Fall.  Shortness of breath.  CHEST - 2 VIEW 07/20/2011:  Comparison: Two-view chest x-ray 05/30/2011, 04/19/2011, 10/11/2010, 12/20/2009 [facility].  Findings: Cardiac silhouette mildly enlarged but stable.   Thoracic aorta mildly tortuous and atherosclerotic, unchanged.  Hilar and mediastinal contours otherwise unremarkable.  Prominent bronchovascular markings diffusely and mild central peribronchial thickening, unchanged.  Lungs otherwise clear.  No pleural effusions.  Visualized bony thorax intact.  IMPRESSION: Stable mild cardiomegaly and chronic bronchitis.  No acute cardiopulmonary disease.  Original Report Authenticated By: Arnell Sieving, M.D.   Dg Pelvis 1-2 Views  07/20/2011  *RADIOLOGY REPORT*  Clinical Data: Fall  PELVIS - 1-2 VIEW  Comparison: None.  Findings: No acute fracture and no dislocation.  Mild osteopenia.  IMPRESSION: No acute bony pathology.  Original Report Authenticated By: Donavan Burnet, M.D.   Dg Femur Left  07/20/2011  *RADIOLOGY REPORT*  Clinical Data: Fall  LEFT FEMUR - 2 VIEW  Comparison: None.  Findings: No acute fracture and no dislocation.  Unremarkable soft tissues.  IMPRESSION: No acute bony pathology.  Original Report Authenticated By: Donavan Burnet, M.D.   Dg Tibia/fibula Right  06/22/2011  *RADIOLOGY REPORT*  Clinical Data:  Fall, lower extremity pain  RIGHT TIBIA AND FIBULA - 2 VIEW  Comparison: None.  Findings: No evidence of fracture or dislocation of the tibia or fibula.  The ankle joint appears normal.  IMPRESSION: No evidence of fracture.  Original Report Authenticated By: Genevive Bi, M.D.   Dg Ankle Complete Left  07/20/2011  *RADIOLOGY REPORT*  Clinical Data: Fall.  Ankle pain.  LEFT ANKLE COMPLETE - 3+ VIEW  Comparison: 04/05/2007  Findings: Hardware in the distal fibula is stable.  Degenerative changes in the ankle joint have progressed.  Osteopenia.  No breakage or loosening of the hardware.  No acute fracture. Degenerative changes in the midfoot are noted.  Minimal spur at the inferior calcaneus. Soft tissue swelling about the ankle is noted.  IMPRESSION: No acute bony pathology.  Chronic changes.  Original Report Authenticated By: Donavan Burnet, M.D.   Ct Head Wo Contrast  07/20/2011  *RADIOLOGY REPORT*  Clinical Data: 78 year old female with altered mental status following fall.  Also unable to stand.  CT HEAD WITHOUT CONTRAST  Technique:  Contiguous axial images were obtained from the base of the skull through the vertex without contrast.  Comparison: 04/02/2010  Findings:   Moderate chronic small vessel white matter ischemic changes are again identified. Remote cerebellar and right basal ganglia infarcts are again noted.  No acute intracranial abnormalities are identified, including mass lesion or mass effect, hydrocephalus, extra-axial fluid collection, midline shift, hemorrhage, or acute infarction.  The visualized bony calvarium is unremarkable.  IMPRESSION: No evidence of acute intracranial abnormality.  Remote cerebellar and right basal ganglia infarcts.  Moderate chronic small vessel white matter ischemic changes  Original Report Authenticated By: Rosendo Gros, M.D.   Dg Knee Complete 4 Views  Right  06/22/2011  *RADIOLOGY REPORT*  Clinical Data: Fall, right knee pain  RIGHT KNEE - COMPLETE 4+ VIEW  Comparison: Plain film 10/13/2010  Findings: No evidence of fracture or dislocation of the right knee. There is a suprapatellar joint effusion which is similar to prior.  IMPRESSION:  1.  No evidence of fracture or dislocation. 2.  Moderate sized suprapatellar joint effusion. This is similar to comparison exam.  Original Report Authenticated By: Genevive Bi, M.D.   Dg Hand Complete Right  07/08/2011  *RADIOLOGY REPORT*  Clinical Data: 78 year old female with fall.  Pain and swelling.  RIGHT HAND - COMPLETE 3+ VIEW  Comparison: 06/26/2011.  Findings: Distal radius and ulna appear stable.  Remote ulnar styloid fracture.  Stable carpal bone alignment.  Subtle osseous irregularity along the radial aspect of the second ray a involving the distal proximal phalanx and radial base of the middle phalanx, otherwise no first ray fracture. No  other acute osseous abnormality.  IMPRESSION:  1.  Possible small avulsion fragments about the radial second PIP joint. 2.  Otherwise no acute osseous abnormality identified in the right hand.  Original Report Authenticated By: Harley Hallmark, M.D.   Dg Hand Complete Right  06/26/2011  *RADIOLOGY REPORT*  Clinical Data: Right hand pain of the second metacarpal phalangeal joint  RIGHT HAND - COMPLETE 3+ VIEW  Comparison: None.  Findings: Positioning is markedly suboptimal due to patient pain. There is apparent soft tissue prominence over the second digit.  No radiopaque foreign body.  No fracture or dislocation is identified. Soft tissue edema is noted over the dorsum of the hand.  IMPRESSION: Soft tissue swelling/edema over the dorsum of the hand and at the second digit.  No fracture or dislocation is identified.  Findings could represent ecchymosis or potentially cellulitis.  If there is strong clinical suspicion for osteomyelitis, MRI with contrast would be the most sensitive way to detect this entity.  Original Report Authenticated By: Harrel Lemon, M.D.    Scheduled Meds:    . bimatoprost  1 drop Both Eyes BID  . cefTRIAXone (ROCEPHIN)  IV  1 g Intravenous Q24H  . cloNIDine  0.2 mg Oral TID  . donepezil  10 mg Oral QHS  . insulin aspart  0-15 Units Subcutaneous TID WC  . insulin aspart  0-5 Units Subcutaneous QHS  . insulin glargine  5 Units Subcutaneous QHS  . metronidazole  500 mg Intravenous Q8H  . mulitivitamin with minerals  1 tablet Oral Daily  . sodium chloride  3 mL Intravenous Q12H   Continuous Infusions:    . sodium chloride 50 mL/hr at 07/22/11 0700     Assessment/Plan: 1. C. difficile colitis-  C. difficile PCR +.  Start Flagyll now. Follow a.m. CBC as her white count is trended up from 14-19.0. 2. Possible urinary tract infection-urine culture shows yeast. Will treat with Diflucan IV 200 mg q 24 3. Weakness+ fall-regularly related to primary disease. CT scan head  negative. Physical therapy assessment=? SNF 4. Gout-husband states patient was on recent treatment for this with an antibiotic. Review of chart reveals patient was given clindamycin 1/31 for possible cellulitis of the arm. Noted also she's had multiple evaluations in the emergency room, sacrum being 07/08/11 for gout of the right arm. There is  high pretest probability of her having C. difficile especially fill the antibiotic. We will cover with Flagyl as per above.  Uric acid was 19.5-treatment with steroids limited by possible C. difficile, and renal  insufficiency for gout medication 5. 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 Repeat labs today show mild renal insufficiency, which seems to be trending towards baseline on IV fluid. Continue every third 24 hours and reassess. Continue potassium-changed fluids as per below given her diabetes. Replacing the 6. Hypertension-usually takes clonidine 0.2 mg 3 times a day at home, Lasix 40 twice a day-changed to PRN Clonidine 0.1 q6prn 7. Hypotension- likely related to diarrhea state. See above 8. Dementia- continue Aricept 10 mg each bedtime plan 9.  Diabetes-blood sugars dropped to the 40s this morning. Patient not taking by mouth well. Discontinued Lantus. Continue sliding scale insulin.  Aged to D5 and lactated Ringer's with KCl 20 mEq. 10. Compensated CHF (EF 50 5/6 and)-stable hold Lasix for now.  Code Status: Full Family Communication: Discussed briefly with husband.  Will review with more results Disposition Plan: Pending Pt/Ot input.   Pleas Koch, MD  [facility] Pager [phone] 07/22/2011, 12:10 PM    LOS: 2 days

## 2011-07-23 DIAGNOSIS — E162 Hypoglycemia, unspecified: Secondary | ICD-10-CM | POA: Diagnosis present

## 2011-07-23 DIAGNOSIS — N390 Urinary tract infection, site not specified: Secondary | ICD-10-CM | POA: Diagnosis present

## 2011-07-23 DIAGNOSIS — A0472 Enterocolitis due to Clostridium difficile, not specified as recurrent: Secondary | ICD-10-CM | POA: Diagnosis present

## 2011-07-23 LAB — GLUCOSE, CAPILLARY
Glucose-Capillary: 188 mg/dL — ABNORMAL HIGH (ref 70–99)
Glucose-Capillary: 235 mg/dL — ABNORMAL HIGH (ref 70–99)
Glucose-Capillary: 261 mg/dL — ABNORMAL HIGH (ref 70–99)
Glucose-Capillary: 164 mg/dL — ABNORMAL HIGH (ref 70–99)

## 2011-07-23 MED ORDER — POTASSIUM CHLORIDE CRYS ER 10 MEQ PO TBCR
10.0000 meq | EXTENDED_RELEASE_TABLET | Freq: Three times a day (TID) | ORAL | Status: DC
  Administered 2011-07-23 – 2011-07-24 (×3): 10 meq via ORAL
  Filled 2011-07-23 (×6): qty 1

## 2011-07-23 MED ORDER — POTASSIUM CHLORIDE IN NACL 20-0.9 MEQ/L-% IV SOLN
INTRAVENOUS | Status: DC
  Administered 2011-07-23 – 2011-07-24 (×2): via INTRAVENOUS
  Filled 2011-07-23 (×4): qty 1000

## 2011-07-23 NOTE — Progress Notes (Signed)
Physical Therapy Treatment Patient Details Name: Glenda Gonzalez MRN: [id number] DOB: 21-Sep-1933 Today's Date: 07/23/2011  PT Assessment/Plan  PT - Assessment/Plan Comments on Treatment Session: Pt showed improvement this session with bed mobility and sitting  balance. Still unable to stand EOB, however was able to squat pivot to recliner with +2 assist.  PT Plan: Discharge plan remains appropriate Follow Up Recommendations: Skilled nursing facility Equipment Recommended: Defer to next venue PT Goals  Acute Rehab PT Goals PT Goal: Supine/Side to Sit - Progress: Progressing toward goal PT Goal: Sit to Stand - Progress: Progressing toward goal PT Transfer Goal: Bed to Chair/Chair to Bed - Progress: Progressing toward goal  PT Treatment Precautions/Restrictions  Precautions Precautions: Fall Restrictions Weight Bearing Restrictions: No Mobility (including Balance) Bed Mobility Bed Mobility: Yes Supine to Sit: 1: +2 Total assist (Pt=50%) Supine to Sit Details (indicate cue type and reason): Multimodal cues for technique, hand placement. Assist for trunk to upright and LEs off EOB Transfers Transfers: Yes Sit to Stand: 1: +2 Total assist;From elevated surface;With upper extremity assist;From bed (Pt=25%) Sit to Stand Details (indicate cue type and reason): with RW x 1. Pt unable to achieve full upright - only about 50%.  Multimodal cues for safety, technique, hand placement. Assist to rise partially.  Stand to Sit: 1: +2 Total assist;To bed;With upper extremity assist (Pt=25%) Stand to Sit Details: Multimodal cues for safety. Assist to control descent.  Squat Pivot Transfers: 1: +2 Total assist (Pt=25%) Squat Pivot Transfer Details (indicate cue type and reason): Bed >BSC. Multi-modal cues for safety, technique. assist to maintain upright posture and for weighshifting. Pt yelled out in pain during activity - c/o pain all over, especially bil LEs (present at rest as  well) Ambulation/Gait Ambulation/Gait: No  Static Sitting Balance Static Sitting - Balance Support: Bilateral upper extremity supported;Feet supported Static Sitting - Level of Assistance: 5: Stand by assistance Static Sitting - Comment/# of Minutes: Sat EOB 3-4 minutes.  Exercise    End of Session PT - End of Session Equipment Utilized During Treatment: Gait belt Activity Tolerance: Patient limited by pain;Patient limited by fatigue Patient left: in chair;with call bell in reach Nurse Communication: Need for lift equipment General Behavior During Session: Lethargic Cognition: [facility] for tasks performed  Rebeca Alert [facility] 07/23/2011, 10:50 AM [phone]

## 2011-07-23 NOTE — Progress Notes (Signed)
CSW met with patient to discuss need for SNF. Patient does not appear fully oriented at this time. CSW called patients spouse and left voicemail requesting call back. Glenda Gonzalez C. Glenda Gonzalez MSW, LCSW [phone]

## 2011-07-23 NOTE — Progress Notes (Signed)
PROGRESS NOTE  JANELE LAGUE GNF:[id number] DOB: 09/15/1933 DOA: 07/20/2011 PCP: Billee Cashing, MD, MD  Brief narrative: 78 year old female admitted on 07/20/11 with weakness and fall, increasing forgetfulness and loose stools.   Clostridium difficile studies were found to be positive, and the patient was started on Flagyl on 07/21/11.  Consultants:  Dr. Eli Hose, Oncology  Antibiotics: Ceftriaxone 2/24>>>2/25  Flagyl 2/25>>> Diflucan 07/22/11--->   Subjective  Mrs. Rieser denies abdominal pain. She reports approximately 2 loose stools since last night. Appetite is fair.   Objective    Interim History: Stable overnight.   Objective: Filed Vitals:   07/22/11 1349 07/22/11 2246 07/23/11 0515 07/23/11 1350  BP: 149/93 132/84 137/81 124/79  Pulse: 90 93 102 81  Temp: 100.4 F (38 C) 99 F (37.2 C) 98.1 F (36.7 C) 98.6 F (37 C)  TempSrc: Oral Oral Oral   Resp: 20 20 20 17   Height:      Weight:      SpO2: 96% 98% 99% 100%    Intake/Output Summary (Last 24 hours) at 07/23/11 1509 Last data filed at 07/23/11 0755  Gross per 24 hour  Intake   1140 ml  Output      0 ml  Net   1140 ml    Exam: Gen:  NAD Cardiovascular:  RRR, No M/R/G Respiratory: Lungs CTAB Gastrointestinal: Abdomen soft, NT/ND with normal active bowel sounds. Extremities: No C/E/C   Data Reviewed: Basic Metabolic Panel:  Lab 07/22/11 8469 07/21/11 0429 07/20/11 1254  NA 134* 137 128*  K 3.2* 3.5 --  CL 101 100 88*  CO2 22 23 22   GLUCOSE 69* 159* 333*  BUN 26* 36* 45*  CREATININE 1.10 1.44* 1.86*  CALCIUM 8.1* 7.8* 8.0*  MG -- 1.6 --  PHOS -- -- --   Liver Function Tests:  Lab 07/21/11 0429  AST 18  ALT 8  ALKPHOS 133*  BILITOT 0.3  PROT 6.2  ALBUMIN 2.2*   CBC:  Lab 07/22/11 0450 07/21/11 0429 07/20/11 2135 07/20/11 1254  WBC 13.3* 19.3* 13.4* 14.6*  NEUTROABS -- -- 11.0* 12.9*  HGB 9.4* 10.9* 10.0* 11.3*  HCT 28.8* 32.6* 30.0* 33.6*  MCV 82.3 81.9 81.3 81.4    PLT 312 317 315 QUESTIONABLE RESULTS, RECOMMEND RECOLLECT TO VERIFY   CBG:  Lab 07/23/11 0732 07/22/11 2244 07/22/11 1709 07/22/11 1132 07/22/11 0857  GLUCAP 163* 188* 345* 248* 79    Recent Results (from the past 240 hour(s))  URINE CULTURE     Status: Normal   Collection Time   07/20/11  2:04 PM      Component Value Range Status Comment   Specimen Description URINE, CATHETERIZED   Final    Special Requests NONE   Final    Culture  Setup Time [id number]   Final    Colony Count >=100,000 COLONIES/ML   Final    Culture YEAST   Final    Report Status 07/22/2011 FINAL   Final   CLOSTRIDIUM DIFFICILE BY PCR     Status: Abnormal   Collection Time   07/21/11  9:06 AM      Component Value Range Status Comment   C difficile by pcr POSITIVE (*) NEGATIVE  Final      Studies:  Dg Chest 2 View 07/20/2011  IMPRESSION: Stable mild cardiomegaly and chronic bronchitis.  No acute cardiopulmonary disease.  Original Report Authenticated By: Arnell Sieving, M.D.    Dg Pelvis 1-2 Views 07/20/2011 IMPRESSION: No acute  bony pathology.  Original Report Authenticated By: Donavan Burnet, M.D.    Dg Femur Left 07/20/2011 IMPRESSION: No acute bony pathology.  Original Report Authenticated By: Donavan Burnet, M.D.    Dg Ankle Complete Left 07/20/2011 IMPRESSION: No acute bony pathology.  Chronic changes.  Original Report Authenticated By: Donavan Burnet, M.D.    Ct Head Wo Contrast 07/20/2011 IMPRESSION: No evidence of acute intracranial abnormality.  Remote cerebellar and right basal ganglia infarcts.  Moderate chronic small vessel white matter ischemic changes  Original Report Authenticated By: Rosendo Gros, M.D.     Scheduled Meds:   . bimatoprost  1 drop Both Eyes BID  . donepezil  10 mg Oral QHS  . fluconazole (DIFLUCAN) IV  200 mg Intravenous Q24H  . insulin aspart  0-15 Units Subcutaneous TID WC  . insulin aspart  0-5 Units Subcutaneous QHS  . metronidazole  500 mg Intravenous Q8H  .  mulitivitamin with minerals  1 tablet Oral Daily  . sodium chloride  3 mL Intravenous Q12H   Continuous Infusions:   . dextrose 5% lactated ringers with KCl 20 mEq/L 50 mL/hr (07/23/11 1249)     Assessment/Plan:  Principal Problem:  *C. difficile colitis /  Acute diarrhea The patient was admitted and Clostridium difficile PCR studies were sent which turned up positive. The patient was subsequently put on Flagyl and has had improvement in her diarrhea. We'll continue to monitor her closely. Active Problems:  DM / hypoglycemia Patient's Lantus was discontinued. Her low blood glucoses are likely due to poor oral intake. We will continue sliding scale. Dextrose was added to her IV fluids, will discontinue.  GOUT Patient has a history of gout with elevated uric acid. This point, she does not appear to have an acute flare.  DEMENTIA, MILD Patient has been continued on her usual dose of Aricept.  HYPERTENSION Patient's blood pressure is being monitored. She is on when necessary clonidine. Her usual antihypertensives were held to 2 hypotension.  Generalized weakness / Fall CT scan of the head was done which does not show any acute abnormality. Physical therapy and occupational therapy has been ordered.  Hypokalemia Likely secondary to GI losses. Her electrolytes are being monitored and replaced as needed.  Hyponatremia Likely secondary to dehydration. Continue IV fluids. We'll change IV fluids back to isotonic saline.  UTI Patient was given one dose of Rocephin cultures subsequently grew be stopped. She is currently on Diflucan.    Code Status: Full code Family Communication: Family updated at bedside Disposition Plan: SNF recommended.   LOS: 3 days   [name], MD Pager [phone]  07/23/2011, 3:09 PM

## 2011-07-24 LAB — GLUCOSE, CAPILLARY
Glucose-Capillary: 84 mg/dL (ref 70–99)
Glucose-Capillary: 132 mg/dL — ABNORMAL HIGH (ref 70–99)

## 2011-07-24 LAB — BASIC METABOLIC PANEL
BUN: 10 mg/dL (ref 6–23)
Chloride: 98 mEq/L (ref 96–112)
GFR calc Af Amer: >90 mL/min (ref >90)
GFR calc non Af Amer: 79 mL/min — ABNORMAL LOW (ref >90)
Potassium: 3.5 mEq/L (ref 3.5–5.1)
Sodium: 125 mEq/L — ABNORMAL LOW (ref 135–145)

## 2011-07-24 LAB — CBC
HCT: 28.9 % — ABNORMAL LOW (ref 36.0–46.0)
Hemoglobin: 9.8 g/dL — ABNORMAL LOW (ref 12.0–15.0)
RBC: 3.62 MIL/uL — ABNORMAL LOW (ref 3.87–5.11)
WBC: 12.8 10*3/uL — ABNORMAL HIGH (ref 4.0–10.5)

## 2011-07-24 MED ORDER — METRONIDAZOLE 500 MG PO TABS: 500.0000 mg | ORAL_TABLET | Freq: Three times a day (TID) | ORAL | Status: AC

## 2011-07-24 MED ORDER — POTASSIUM CHLORIDE CRYS ER 10 MEQ PO TBCR: 10.0000 meq | EXTENDED_RELEASE_TABLET | Freq: Three times a day (TID) | ORAL | Status: DC

## 2011-07-24 MED ORDER — FLUCONAZOLE 150 MG PO TABS: 150.0000 mg | ORAL_TABLET | Freq: Once | ORAL | Status: AC

## 2011-07-24 NOTE — Progress Notes (Signed)
07/24/11 Patient is dressed in paper gown awaiting transport to [facility]. PIV has been removed. Patient is waiting patiently for discharge to take place.

## 2011-07-24 NOTE — Discharge Instructions (Signed)
Clostridium Difficile Infection Clostridium difficile (C. diff) is a bacteria found in the intestinal tract or colon. Under certain conditions, it causes diarrhea and sometimes severe disease. The severe form of the disease is known as pseudomembranous colitis (often called C. diff colitis). This disease can damage the lining of the colon or cause the colon to become enlarged (toxic megacolon).  CAUSES  Your colon normally contains many different bacteria, including C. diff. The balance of bacteria in your colon can change during illness. This is especially true when you take antibiotic medicine. Taking antibiotics may allow the C. diff to grow, multiply excessively, and make a toxin that then causes illness. The elderly and people with certain medical conditions have a greater risk of getting C. diff infections. SYMPTOMS   Watery diarrhea.   Fever.   Fatigue.   Loss of appetite.   Nausea.   Abdominal swelling, pain, or tenderness.   Dehydration.  DIAGNOSIS  Your symptoms may make your caregiver suspicious of a C. diff infection, especially if you have used antibiotics in the preceding weeks. However, there are only 2 ways to know for certain whether you have a C. diff infection:  A lab test that finds the toxin in your stool.   The specific appearance of an abnormality (pseudomembrane) in your colon. This can only be seen by doing a sigmoidoscopy or colonoscopy. These procedures involve passing an instrument through your rectum to look at the inside of your colon.  Your caregiver will help determine if these tests are necessary. TREATMENT   Most people are successfully treated with 1 of 2 specific antibiotics, usually given by mouth. Other antibiotics you are receiving are stopped if possible.   Intravenous (IV) fluids and correction of electrolyte imbalance may be necessary.   Rarely, surgery may be needed to remove the infected part of the intestines.   Careful hand washing by  you and your caregivers is important to prevent the spread of infection. In the hospital, your caregivers may also put on gowns and gloves to prevent the spread of the C. diff bacteria. Your room is also cleaned regularly with a hospital grade disinfectant.  HOME CARE INSTRUCTIONS  Drink enough fluids to keep your urine clear or pale yellow. Avoid milk, caffeine, and alcohol.   Ask your caregiver for specific rehydration instructions.   Try eating small, frequent meals rather than large meals.   Take your antibiotics as directed. Finish them even if you start to feel better.   Do not use medicines to slow diarrhea. This could delay healing or cause complications.   Wash your hands thoroughly after using the bathroom and before preparing food.   Make sure people who live with you wash their hands often, too.  SEEK MEDICAL CARE IF:  Diarrhea persists longer than expected or recurs after completing your course of antibiotic treatment for the C. diff infection.   You have trouble staying hydrated.  SEEK IMMEDIATE MEDICAL CARE IF:  You develop a new fever.   You have increasing abdominal pain or tenderness.   There is blood in your stools, or your stools are dark black and tarry.   You cannot hold down food or liquids.  MAKE SURE YOU:   Understand these instructions.   Will watch your condition.   Will get help right away if you are not doing well or get worse.  Document Released: 02/19/2005 Document Revised: 01/22/2011 Document Reviewed: 10/18/2010 [facility] Patient Information 2012 [place], [place].

## 2011-07-24 NOTE — Progress Notes (Signed)
Patient cleared for discharge. Patient accepted at [facility]. Packet copied and placed in [place]. [facility] called for transportation. Patients son and spouse notified and agreeable to same.  Reniah Cottingham C. Remmy Riffe MSW, LCSW [phone]

## 2011-07-24 NOTE — Progress Notes (Signed)
CSW spoke with patients spouse. He states that patient has previously been to [facility] and would like her to return there. Information faxed to [facility]. They can accept patient today. Notified MD of same.  Katlyn Muldrew C. Kelsey Durflinger MSW, LCSW [phone]

## 2011-07-24 NOTE — Discharge Summary (Signed)
Physician Discharge Summary  Patient ID: SEVANNAH MADIA MRN: [id number] DOB/AGE: [birth year] 78 y.o.  Admit date: 07/20/2011 Discharge date: 07/24/2011  Primary Care Physician:  Billee Cashing, MD, MD   Discharge Diagnoses:    Present on Admission:  .Generalized weakness .Fall .Thrombocytopenia .Hypokalemia .Hyponatremia .DM .GOUT .DEMENTIA, MILD .HYPERTENSION .Acute diarrhea .C. difficile colitis .UTI (lower urinary tract infection) .Hypoglycemia  Discharge Medications:  Medication List  As of 07/24/2011 11:42 AM   STOP taking these medications         cloNIDine 0.2 MG tablet      furosemide 40 MG tablet      HYDROcodone-acetaminophen 5-500 MG per tablet         TAKE these medications         aspirin EC 81 MG tablet   Take 81 mg by mouth daily.      bimatoprost 0.03 % ophthalmic solution   Commonly known as: LUMIGAN   Place 1 drop into both eyes 2 (two) times daily.      cyclobenzaprine 10 MG tablet   Commonly known as: FLEXERIL   Take 10 mg by mouth 3 (three) times daily as needed. For muscle spasms      donepezil 10 MG tablet   Commonly known as: ARICEPT   Take 10 mg by mouth at bedtime.      esomeprazole 40 MG capsule   Commonly known as: NEXIUM   Take 1 capsule (40 mg total) by mouth 2 (two) times daily before a meal.      fluconazole 150 MG tablet   Commonly known as: DIFLUCAN   Take 1 tablet (150 mg total) by mouth once. Take for 4 more days      glyBURIDE 5 MG tablet   Commonly known as: DIABETA   Take 10 mg by mouth 2 (two) times daily with a meal.      metroNIDAZOLE 500 MG tablet   Commonly known as: FLAGYL   Take 1 tablet (500 mg total) by mouth 3 (three) times daily.  Take for 6 more days.      potassium chloride 10 MEQ tablet   Commonly known as: K-DUR,KLOR-CON   Take 1 tablet (10 mEq total) by mouth 3 (three) times daily.      pregabalin 50 MG capsule   Commonly known as: LYRICA   Take 50 mg by mouth 3 (three) times  daily.      traMADol 50 MG tablet   Commonly known as: ULTRAM   Take 50 mg by mouth every 6 (six) hours as needed. For pain. Maximum dose= 8 tablets per day      zolpidem 5 MG tablet   Commonly known as: AMBIEN   Take 5 mg by mouth at bedtime as needed. For sleep             Disposition and Follow-up: The patient is being [facility]. She should follow up with her PCP in one week.   Medical Consults:  Eli Hose, Oncology  Other Consults:  Physical Therapy: SNF appropriate  Occupational Therapy: Skilled nursing facility. Pt would require Supervision/Assistance - 24 hour, HHOT and aide if returning home with family.  Significant Diagnostic Studies:  Dg Chest 2 View 07/20/2011 IMPRESSION: Stable mild cardiomegaly and chronic bronchitis. No acute cardiopulmonary disease. Original Report Authenticated By: Arnell Sieving, M.D.  Dg Pelvis 1-2 Views 07/20/2011 IMPRESSION: No acute bony pathology. Original Report Authenticated By: Donavan Burnet, M.D.  Dg Femur Left  07/20/2011 IMPRESSION: No acute bony pathology. Original Report Authenticated By: Donavan Burnet, M.D.  Dg Ankle Complete Left 07/20/2011 IMPRESSION: No acute bony pathology. Chronic changes. Original Report Authenticated By: Donavan Burnet, M.D.  Ct Head Wo Contrast 07/20/2011 IMPRESSION: No evidence of acute intracranial abnormality. Remote cerebellar and right basal ganglia infarcts. Moderate chronic small vessel white matter ischemic changes Original Report Authenticated By: Rosendo Gros, M.D.    Discharge Laboratory Values: Basic Metabolic Panel:  Lab 07/24/11 2725 07/22/11 0450 07/21/11 0429 07/20/11 1254  NA 125* 134* 137 128*  K 3.5 3.2* -- --  CL 98 101 100 88*  CO2 18* 22 23 22   GLUCOSE 85 69* 159* 333*  BUN 10 26* 36* 45*  CREATININE 0.79 1.10 1.44* 1.86*  CALCIUM 7.7* 8.1* 7.8* 8.0*  MG -- -- 1.6 --  PHOS -- -- -- --   GFR Estimated Creatinine Clearance: 63.1  ml/min (by C-G formula based on Cr of 0.79). Liver Function Tests:  Lab 07/21/11 0429  AST 18  ALT 8  ALKPHOS 133*  BILITOT 0.3  PROT 6.2  ALBUMIN 2.2*   Coagulation profile  Lab 07/20/11 2135  INR 1.29  PROTIME --    CBC:  Lab 07/24/11 0435 07/22/11 0450 07/21/11 0429 07/20/11 2135 07/20/11 1254  WBC 12.8* 13.3* 19.3* 13.4* 14.6*  NEUTROABS -- -- -- 11.0* 12.9*  HGB 9.8* 9.4* 10.9* 10.0* 11.3*  HCT 28.9* 28.8* 32.6* 30.0* 33.6*  MCV 79.8 82.3 81.9 81.3 81.4  PLT 247 312 317 315 QUESTIONABLE RESULTS, RECOMMEND RECOLLECT TO VERIFY   CBG:  Lab 07/24/11 0747 07/23/11 2131 07/23/11 1652 07/23/11 1212 07/23/11 0732  GLUCAP 84 164* 261* 235* 163*   Microbiology Recent Results (from the past 240 hour(s))  URINE CULTURE     Status: Normal   Collection Time   07/20/11  2:04 PM      Component Value Range Status Comment   Specimen Description URINE, CATHETERIZED   Final    Special Requests NONE   Final    Culture  Setup Time [id number]   Final    Colony Count >=100,000 COLONIES/ML   Final    Culture YEAST   Final    Report Status 07/22/2011 FINAL   Final   CLOSTRIDIUM DIFFICILE BY PCR     Status: Abnormal   Collection Time   07/21/11  9:06 AM      Component Value Range Status Comment   C difficile by pcr POSITIVE (*) NEGATIVE  Final      Brief H and P: For complete details please refer to admission H and P, but in brief, Mrs. Croft is a 78 year old female admitted on 07/20/11 with weakness and fall, increasing forgetfulness and loose stools.    Physical Exam at Discharge: BP 145/84  Pulse 79  Temp(Src) 98.5 F (36.9 C) (Oral)  Resp 20  Ht 5' 1.5" (1.562 m)  Wt 91.1 kg (200 lb 13.4 oz)  BMI 37.34 kg/m2  SpO2 100% Gen:  NAD Cardiovascular:  RRR, No M/R/G Respiratory: Lungs CTAB Gastrointestinal: Abdomen soft, NT/ND with normal active bowel sounds. Extremities: No C/E/C     Hospital Course:  *C. difficile colitis / Acute diarrhea  The patient was  admitted and Clostridium difficile PCR studies were sent which turned up positive. The patient was subsequently put on Flagyl and has had improvement in her diarrhea. She is currently on day # 4 of Flagyl, and will need 6 more days of therapy. DM /  hypoglycemia  The patient can resume her Amaryl at d/c.  Hypoglycemic spells were secondary to Lantus, which has been discontinued. GOUT  Patient has a history of gout with elevated uric acid. This point, she does not appear to have an acute flare.  DEMENTIA, MILD  Patient has been continued on her usual dose of Aricept.  HYPERTENSION  Patient's blood pressure is being monitored. She was placed on when necessary clonidine. Her usual antihypertensives were held due to hypotension. We will continue to hold these at d/c.  Her BP should be monitored closely, and her medications reintroduced when needed. Generalized weakness / Fall  CT scan of the head was done which does not show any acute abnormality. Physical therapy and occupational therapy were ordered, and recommendations were to pursue SNF for rehab. Hypokalemia  Likely secondary to GI losses. Her electrolytes are being monitored and replaced as needed. We will discharge her on routine potassium supplementation, but this can likely be d/c'd once her diarrhea has resolved. Hyponatremia  Likely secondary to dehydration. She was maintained on IV fluids. Recommend F/U BMET in 2-3 days. UTI  Patient was given one dose of Rocephin cultures subsequently grew yeast. She is currently on Diflucan, day #3.  Can be d/c'd after 7 days of therapy (4 more days of treatment).    Recommendations for hospital follow-up: 1.  F/U BMET in 2-3 days to evaluate electrolytes  Diet:  Carbohydrate modified  Activity:  Increase activity slowly, walk with assistance  Condition at Discharge:   Improved  Time spent on Discharge:  35 minutes  Signed: Dr. Trula Ore Jaidan Stachnik Pager [phone] 07/24/2011, 11:42  AM

## 2011-12-19 ENCOUNTER — Encounter (HOSPITAL_COMMUNITY): Payer: Self-pay | Admitting: Emergency Medicine

## 2011-12-19 ENCOUNTER — Emergency Department (HOSPITAL_COMMUNITY)
Admission: EM | Admit: 2011-12-19 | Discharge: 2011-12-20 | Disposition: A | Discharge status: Home / Self Care | Payer: Medicare Other | Attending: Emergency Medicine | Admitting: Emergency Medicine

## 2011-12-19 ENCOUNTER — Emergency Department (HOSPITAL_COMMUNITY): Payer: Medicare Other

## 2011-12-19 DIAGNOSIS — N289 Disorder of kidney and ureter, unspecified: Secondary | ICD-10-CM | POA: Insufficient documentation

## 2011-12-19 DIAGNOSIS — K219 Gastro-esophageal reflux disease without esophagitis: Secondary | ICD-10-CM | POA: Insufficient documentation

## 2011-12-19 DIAGNOSIS — I2510 Atherosclerotic heart disease of native coronary artery without angina pectoris: Secondary | ICD-10-CM | POA: Insufficient documentation

## 2011-12-19 DIAGNOSIS — I509 Heart failure, unspecified: Secondary | ICD-10-CM | POA: Insufficient documentation

## 2011-12-19 DIAGNOSIS — E119 Type 2 diabetes mellitus without complications: Secondary | ICD-10-CM | POA: Insufficient documentation

## 2011-12-19 DIAGNOSIS — Z8249 Family history of ischemic heart disease and other diseases of the circulatory system: Secondary | ICD-10-CM | POA: Insufficient documentation

## 2011-12-19 DIAGNOSIS — R739 Hyperglycemia, unspecified: Secondary | ICD-10-CM

## 2011-12-19 DIAGNOSIS — I10 Essential (primary) hypertension: Secondary | ICD-10-CM | POA: Insufficient documentation

## 2011-12-19 DIAGNOSIS — M109 Gout, unspecified: Secondary | ICD-10-CM | POA: Insufficient documentation

## 2011-12-19 LAB — URINALYSIS, ROUTINE W REFLEX MICROSCOPIC
Bilirubin Urine: NEGATIVE
Ketones, ur: NEGATIVE mg/dL
Nitrite: NEGATIVE
Specific Gravity, Urine: 1.029 (ref 1.005–1.030)
Urobilinogen, UA: 0.2 mg/dL (ref 0.0–1.0)

## 2011-12-19 LAB — BASIC METABOLIC PANEL
BUN: 37 mg/dL — ABNORMAL HIGH (ref 6–23)
Creatinine, Ser: 1.39 mg/dL — ABNORMAL HIGH (ref 0.50–1.10)
GFR calc Af Amer: 42 mL/min — ABNORMAL LOW (ref >90)
GFR calc non Af Amer: 36 mL/min — ABNORMAL LOW (ref >90)
Glucose, Bld: 407 mg/dL — ABNORMAL HIGH (ref 70–99)

## 2011-12-19 LAB — URINE MICROSCOPIC-ADD ON

## 2011-12-19 MED ORDER — INSULIN ASPART 100 UNIT/ML ~~LOC~~ SOLN
3.0000 [IU] | Freq: Once | SUBCUTANEOUS | Status: AC
  Administered 2011-12-19: 3 [IU] via SUBCUTANEOUS
  Filled 2011-12-19: qty 1

## 2011-12-19 MED ORDER — SODIUM CHLORIDE 0.9 % IV SOLN
INTRAVENOUS | Status: DC
  Administered 2011-12-19: 125 mL/h via INTRAVENOUS

## 2011-12-19 NOTE — ED Notes (Signed)
CBG 477  

## 2011-12-19 NOTE — ED Notes (Signed)
EKG printed and given to EDP Caparossi for review. Most recent EKG printed for comparison.

## 2011-12-19 NOTE — ED Notes (Signed)
Pt states cbg 40 min ago read >600

## 2011-12-19 NOTE — ED Provider Notes (Addendum)
History     CSN: [id number]  Arrival date & time 12/19/11  1839   First MD Initiated Contact with Patient 12/19/11 1957      Chief Complaint  Patient presents with  . Hyperglycemia    (Consider location/radiation/quality/duration/timing/severity/associated sxs/prior treatment) HPI Comments: Patient noted that yesterday.  Her blood sugar was over 400 despite her regular use of her medications.  She did note a slight upper respiratory tract infection with a runny nose.  Today, her blood sugar was greater than 600, when she checked it is his developed a cough.  She denies dysuria, nausea, vomiting, diarrhea, abdominal pain, chest pain, shortness of breath, dizziness.  She did not contact her primary care physician.  She has not changed any of her medications.  She states she was seen by her regular doctor Kathe Mariner 2, days ago.  He did not change any of her routine medications  The history is provided by the patient and the spouse.    Past Medical History  Diagnosis Date  . Diabetes mellitus   . GERD (gastroesophageal reflux disease)   . Hypertension   . Diverticulitis   . Gout   . Essential and other specified forms of tremor   . Dementia   . CAD (coronary artery disease)   . CHF (congestive heart failure)     Diastolic dysfunction    Past Surgical History  Procedure Date  . Dilation and curettage of uterus   . Tumor removal     Family History  Problem Relation Age of Onset  . Colon cancer Neg Hx   . Heart disease Father   . Heart disease Brother     History  Substance Use Topics  . Smoking status: Never Smoker   . Smokeless tobacco: Never Used  . Alcohol Use: No    OB History    Grav Para Term Preterm Abortions TAB SAB Ect Mult Living                  Review of Systems  Constitutional: Negative for fever and chills.  HENT: Positive for rhinorrhea. Negative for congestion and sore throat.   Respiratory: Positive for cough. Negative for shortness of  breath and wheezing.   Gastrointestinal: Negative for nausea, vomiting, abdominal pain, diarrhea and constipation.  Genitourinary: Negative for dysuria.  Neurological: Negative for dizziness and weakness.    Allergies  Codeine; Morphine; and Penicillins  Home Medications   Current Outpatient Rx  Name Route Sig Dispense Refill  . ASPIRIN EC 81 MG PO TBEC Oral Take 81 mg by mouth daily.    Marland Kitchen BIMATOPROST 0.03 % OP SOLN Both Eyes Place 1 drop into both eyes 2 (two) times daily.     . CYCLOBENZAPRINE HCL 10 MG PO TABS Oral Take 10 mg by mouth 3 (three) times daily as needed. For muscle spasms    . DONEPEZIL HCL 10 MG PO TABS Oral Take 10 mg by mouth at bedtime.     Marland Kitchen ESOMEPRAZOLE MAGNESIUM 40 MG PO CPDR Oral Take 1 capsule (40 mg total) by mouth 2 (two) times daily before a meal. 60 capsule 11  . GLYBURIDE 5 MG PO TABS Oral Take 10 mg by mouth 2 (two) times daily with a meal.     . POTASSIUM CHLORIDE CRYS ER 10 MEQ PO TBCR Oral Take 10 mEq by mouth 3 (three) times daily.    Marland Kitchen PREGABALIN 50 MG PO CAPS Oral Take 50 mg by mouth 3 (three) times daily.      Marland Kitchen  TRAMADOL HCL 50 MG PO TABS Oral Take 50 mg by mouth every 6 (six) hours as needed. For pain. Maximum dose= 8 tablets per day    . ZOLPIDEM TARTRATE 5 MG PO TABS Oral Take 5 mg by mouth at bedtime as needed. For sleep      BP 163/95  Pulse 85  Temp 98.2 F (36.8 C) (Oral)  Resp 16  SpO2 98%  Physical Exam  Constitutional: She appears well-developed and well-nourished.  HENT:  Head: Normocephalic.  Eyes: Pupils are equal, round, and reactive to light.  Cardiovascular: Normal rate.   Pulmonary/Chest: Effort normal and breath sounds normal. No respiratory distress. She has no wheezes.  Abdominal: Soft. She exhibits no distension. There is no tenderness.  Musculoskeletal: She exhibits no edema and no tenderness.       Patient has been wheelchair bound to 2 lower extremity weakness.  For several years  Neurological: She is alert.    Skin: Skin is warm.    ED Course  Procedures (including critical care time)  Labs Reviewed  GLUCOSE, CAPILLARY - Abnormal; Notable for the following:    Glucose-Capillary 477 (*)     All other components within normal limits  BASIC METABOLIC PANEL - Abnormal; Notable for the following:    Sodium 128 (*)     Chloride 92 (*)     Glucose, Bld 407 (*)     BUN 37 (*)     Creatinine, Ser 1.39 (*)     GFR calc non Af Amer 36 (*)     GFR calc Af Amer 42 (*)     All other components within normal limits  URINALYSIS, ROUTINE W REFLEX MICROSCOPIC - Abnormal; Notable for the following:    APPearance CLOUDY (*)     Glucose, UA >1000 (*)     Hgb urine dipstick TRACE (*)     Leukocytes, UA MODERATE (*)     All other components within normal limits  URINE MICROSCOPIC-ADD ON - Abnormal; Notable for the following:    Squamous Epithelial / LPF MANY (*)     Bacteria, UA MANY (*)     All other components within normal limits  GLUCOSE, CAPILLARY - Abnormal; Notable for the following:    Glucose-Capillary 304 (*)     All other components within normal limits  GLUCOSE, CAPILLARY - Abnormal; Notable for the following:    Glucose-Capillary 256 (*)     All other components within normal limits  LACTIC ACID, PLASMA - Abnormal; Notable for the following:    Lactic Acid, Venous 2.3 (*)     All other components within normal limits  GLUCOSE, CAPILLARY - Abnormal; Notable for the following:    Glucose-Capillary 267 (*)     All other components within normal limits  URINE CULTURE  BETA-HYDROXYBUTYRIC ACID   Dg Chest 2 View  12/19/2011  *RADIOLOGY REPORT*  Clinical Data: Hypertension  CHEST - 2 VIEW  Comparison: 07/20/2011  Findings: Normal heart size.  Clear lungs.  No pneumothorax and no pleural effusion.  IMPRESSION: No active cardiopulmonary disease.  Original Report Authenticated By: Donavan Burnet, M.D.     1. Hyperglycemia without ketosis   2. Renal insufficiency, mild     ED ECG  REPORT   Date: 01/09/2012  EKG Time: 9:04 PM  Rate: 82  Rhythm: normal sinus rhythm,  unchanged from previous tracings  Axis:normal  Intervals:nonspecific intraventricular conduction delay  ST&T Change: multiple PVC multifocal  Narrative Interpretation: abnormal  MDM  Will obtain CBC i-STAT urine, and chest x-ray, looking for sources of infection.  In this patient with hyperglycemia         Arman Filter, NP 12/20/11 0245  Arman Filter, NP 01/09/12 2106

## 2011-12-19 NOTE — ED Notes (Signed)
Pt refused toallow RN to assess for blood draw/IV start. Pt states she is a difficult stick and will only let IV team attempt. IV team paged per Pt request.

## 2011-12-20 LAB — GLUCOSE, CAPILLARY: Glucose-Capillary: 267 mg/dL — ABNORMAL HIGH (ref 70–99)

## 2011-12-20 NOTE — ED Provider Notes (Signed)
Medical screening examination/treatment/procedure(s) were conducted as a shared visit with non-physician practitioner(s) and myself.  I personally evaluated the patient during the encounter  Derwood Kaplan, MD 12/20/11 1454

## 2011-12-20 NOTE — ED Notes (Signed)
Pt. Wanted to leave ED against medical advise. MD notified.

## 2011-12-21 LAB — URINE CULTURE
Colony Count: >=100000
Special Requests: NORMAL

## 2012-01-10 NOTE — ED Provider Notes (Signed)
Medical screening examination/treatment/procedure(s) were performed by non-physician practitioner and as supervising physician I was immediately available for consultation/collaboration.  Derwood Kaplan, MD 01/10/12 [phone]

## 2012-03-07 ENCOUNTER — Emergency Department (HOSPITAL_COMMUNITY)
Admission: EM | Admit: 2012-03-07 | Discharge: 2012-03-08 | Disposition: A | Discharge status: Home / Self Care | Payer: Medicare Other | Attending: Emergency Medicine | Admitting: Emergency Medicine

## 2012-03-07 ENCOUNTER — Encounter (HOSPITAL_COMMUNITY): Payer: Self-pay | Admitting: Emergency Medicine

## 2012-03-07 DIAGNOSIS — I509 Heart failure, unspecified: Secondary | ICD-10-CM | POA: Insufficient documentation

## 2012-03-07 DIAGNOSIS — Z79899 Other long term (current) drug therapy: Secondary | ICD-10-CM | POA: Insufficient documentation

## 2012-03-07 DIAGNOSIS — I2510 Atherosclerotic heart disease of native coronary artery without angina pectoris: Secondary | ICD-10-CM | POA: Insufficient documentation

## 2012-03-07 DIAGNOSIS — N200 Calculus of kidney: Secondary | ICD-10-CM | POA: Insufficient documentation

## 2012-03-07 DIAGNOSIS — N134 Hydroureter: Secondary | ICD-10-CM | POA: Insufficient documentation

## 2012-03-07 DIAGNOSIS — E119 Type 2 diabetes mellitus without complications: Secondary | ICD-10-CM | POA: Insufficient documentation

## 2012-03-07 DIAGNOSIS — R1032 Left lower quadrant pain: Secondary | ICD-10-CM

## 2012-03-07 DIAGNOSIS — F0390 Unspecified dementia without behavioral disturbance: Secondary | ICD-10-CM | POA: Insufficient documentation

## 2012-03-07 DIAGNOSIS — E871 Hypo-osmolality and hyponatremia: Secondary | ICD-10-CM | POA: Insufficient documentation

## 2012-03-07 DIAGNOSIS — E876 Hypokalemia: Secondary | ICD-10-CM | POA: Insufficient documentation

## 2012-03-07 DIAGNOSIS — I10 Essential (primary) hypertension: Secondary | ICD-10-CM | POA: Insufficient documentation

## 2012-03-07 LAB — CBC WITH DIFFERENTIAL/PLATELET

## 2012-03-07 LAB — URINALYSIS, ROUTINE W REFLEX MICROSCOPIC
Bilirubin Urine: NEGATIVE
Glucose, UA: >1000 mg/dL — AB
Specific Gravity, Urine: 1.017 (ref 1.005–1.030)
Urobilinogen, UA: 2 mg/dL — ABNORMAL HIGH (ref 0.0–1.0)

## 2012-03-07 LAB — COMPREHENSIVE METABOLIC PANEL
ALT: 17 U/L (ref 0–35)
Calcium: 9.8 mg/dL (ref 8.4–10.5)
GFR calc Af Amer: 49 mL/min — ABNORMAL LOW (ref >90)
Glucose, Bld: 361 mg/dL — ABNORMAL HIGH (ref 70–99)
Sodium: 129 mEq/L — ABNORMAL LOW (ref 135–145)
Total Protein: 8.4 g/dL — ABNORMAL HIGH (ref 6.0–8.3)

## 2012-03-07 LAB — POCT I-STAT TROPONIN I

## 2012-03-07 LAB — URINE MICROSCOPIC-ADD ON

## 2012-03-07 MED ORDER — SODIUM CHLORIDE 0.9 % IV SOLN
1000.0000 mL | Freq: Once | INTRAVENOUS | Status: AC
  Administered 2012-03-07: 1000 mL via INTRAVENOUS

## 2012-03-07 MED ORDER — FENTANYL CITRATE 0.05 MG/ML IJ SOLN
50.0000 ug | Freq: Once | INTRAMUSCULAR | Status: AC
  Administered 2012-03-07: 50 ug via INTRAVENOUS
  Filled 2012-03-07: qty 2

## 2012-03-07 MED ORDER — ONDANSETRON HCL 4 MG/2ML IJ SOLN
4.0000 mg | Freq: Once | INTRAMUSCULAR | Status: AC
  Administered 2012-03-07: 4 mg via INTRAVENOUS
  Filled 2012-03-07: qty 2

## 2012-03-07 MED ORDER — ONDANSETRON 4 MG PO TBDP
8.0000 mg | ORAL_TABLET | Freq: Once | ORAL | Status: AC
  Administered 2012-03-07: 8 mg via ORAL
  Filled 2012-03-07: qty 2

## 2012-03-07 NOTE — ED Notes (Signed)
Patient transported through [facility] c/o abdominal pain after eating sausage about 2 hours ago.  Patient states that she had some nausea and vomiting. Paramedic reports that patient did not vomit in there presence.  CBG was 420 on EMS truck.  Patient is not showing any signs of distress at this time.

## 2012-03-07 NOTE — ED Notes (Signed)
Attempted to establish IV with no success.

## 2012-03-07 NOTE — ED Notes (Addendum)
Entered room and observed pt vomiting clear fluid.  Elevated HOB and provided pt with emesis bag.  Linen and gown was changed on pt.  Nurse asked if pt was able to finish drinking contrast and pt stated, "No".  Dr. Rulon Abide has been notified.

## 2012-03-07 NOTE — ED Notes (Signed)
Dr. Rulon Abide informed of patient's elevated BP readings.

## 2012-03-07 NOTE — ED Notes (Signed)
Explained to pt that she will need to drink contrast in order to have CT scan completed.  Pt verbalized understanding and nodded "yes"

## 2012-03-08 ENCOUNTER — Emergency Department (HOSPITAL_COMMUNITY): Payer: Medicare Other

## 2012-03-08 MED ORDER — POTASSIUM CHLORIDE 20 MEQ/15ML (10%) PO LIQD
40.0000 meq | Freq: Once | ORAL | Status: AC
  Administered 2012-03-08: 40 meq via ORAL
  Filled 2012-03-08: qty 30

## 2012-03-08 MED ORDER — METOPROLOL TARTRATE 1 MG/ML IV SOLN
5.0000 mg | Freq: Once | INTRAVENOUS | Status: AC
  Administered 2012-03-08: 5 mg via INTRAVENOUS
  Filled 2012-03-08: qty 5

## 2012-03-08 MED ORDER — POTASSIUM CHLORIDE 10 MEQ/100ML IV SOLN
10.0000 meq | INTRAVENOUS | Status: AC
  Administered 2012-03-08 (×2): 10 meq via INTRAVENOUS
  Filled 2012-03-08 (×2): qty 100

## 2012-03-08 NOTE — ED Notes (Signed)
Pt stated she uses a wheelchair at home.

## 2012-03-08 NOTE — ED Provider Notes (Signed)
History     CSN: [id number]  Arrival date & time 03/07/12  2123   First MD Initiated Contact with Patient 03/07/12 2304      Chief Complaint  Patient presents with  . Abdominal Pain    (Consider location/radiation/quality/duration/timing/severity/associated sxs/prior treatment) HPI Glenda Gonzalez is a 78 y.o. female history of diverticulitis and diverticulosis presenting to the emergency Department left lower quadrant pain. This pain started about 2 hours ago after being some sausage. She had some associated nausea and vomiting. She has not had any vomiting since either on the EMS water in the emergency department. She says her pain is severe, it is sharp and stabbing it is well localized left lower quadrant, does not radiate.  She's had no fevers or chills, no history of kidney stones, no dysuria or frequency, no chest pain or shortness of breath.   Past Medical History  Diagnosis Date  . Diabetes mellitus   . GERD (gastroesophageal reflux disease)   . Hypertension   . Diverticulitis   . Gout   . Essential and other specified forms of tremor   . Dementia   . CAD (coronary artery disease)   . CHF (congestive heart failure)     Diastolic dysfunction    Past Surgical History  Procedure Date  . Dilation and curettage of uterus   . Tumor removal     Family History  Problem Relation Age of Onset  . Colon cancer Neg Hx   . Heart disease Father   . Heart disease Brother     History  Substance Use Topics  . Smoking status: Never Smoker   . Smokeless tobacco: Never Used  . Alcohol Use: No    OB History    Grav Para Term Preterm Abortions TAB SAB Ect Mult Living                  Review of Systems At least 10pt or greater review of systems completed and are negative except where specified in the HPI.  Allergies  Codeine; Fentanyl; Morphine; and Penicillins  Home Medications   Current Outpatient Rx  Name Route Sig Dispense Refill  . ALLOPURINOL 100 MG PO  TABS Oral Take 100 mg by mouth daily.    . ASPIRIN EC 81 MG PO TBEC Oral Take 81 mg by mouth daily.    Marland Kitchen BIMATOPROST 0.03 % OP SOLN Both Eyes Place 1 drop into both eyes 2 (two) times daily.     . DONEPEZIL HCL 10 MG PO TABS Oral Take 10 mg by mouth at bedtime.     Marland Kitchen ESOMEPRAZOLE MAGNESIUM 40 MG PO CPDR Oral Take 40 mg by mouth 2 (two) times daily before a meal.    . FUROSEMIDE 20 MG PO TABS Oral Take 20 mg by mouth daily.    . GLYBURIDE 5 MG PO TABS Oral Take 10 mg by mouth 2 (two) times daily with a meal.     . LOPERAMIDE HCL 2 MG PO CAPS Oral Take 2 mg by mouth daily as needed. For diarrhea    . SYSTANE ULTRA OP Both Eyes Place 1 drop into both eyes daily as needed. For dry eyes    . POTASSIUM CHLORIDE CRYS ER 10 MEQ PO TBCR Oral Take 10 mEq by mouth 3 (three) times daily.    Marland Kitchen PREGABALIN 50 MG PO CAPS Oral Take 50 mg by mouth 3 (three) times daily.      . TRAMADOL HCL 50 MG PO TABS Oral  Take 50 mg by mouth every 6 (six) hours as needed. For pain. Maximum dose= 8 tablets per day    . ZOLPIDEM TARTRATE 5 MG PO TABS Oral Take 5 mg by mouth at bedtime as needed. For sleep      BP 224/133  Pulse 124  Temp 97.5 F (36.4 C)  Resp 22  Ht 5' 1.5" (1.562 m)  Wt 146 lb (66.225 kg)  BMI 27.14 kg/m2  SpO2 97%  Physical Exam  Nursing notes reviewed.  Electronic medical record reviewed. VITAL SIGNS:   Filed Vitals:   03/07/12 2245 03/07/12 2300 03/07/12 2315 03/07/12 2330  BP: 189/110 206/113 205/112 224/133  Pulse: 113 111 114 124  Temp:      Resp: 28 30 20 22   Height:      Weight:      SpO2: 98% 96% 96% 97%   CONSTITUTIONAL: Awake, oriented, appears non-toxic HENT: Atraumatic, normocephalic, oral mucosa pink and moist, airway patent. Nares patent without drainage. External ears normal. EYES: Conjunctiva clear, EOMI, PERRLA NECK: Trachea midline, non-tender, supple CARDIOVASCULAR: Normal heart rate, Normal rhythm, No murmurs, rubs, gallops PULMONARY/CHEST: Clear to auscultation, no  rhonchi, wheezes, or rales. Symmetrical breath sounds. Non-tender. ABDOMINAL: Non-distended, soft, obese, tender to palpation the left lower quadrant without guarding or rebound tenderness  BS normal. NEUROLOGIC: Non-focal, moving all four extremities, no gross sensory or motor deficits. EXTREMITIES: No clubbing, cyanosis, or edema SKIN: Warm, Dry, No erythema, No rash  ED Course  Procedures (including critical care time)  Labs Reviewed  GLUCOSE, CAPILLARY - Abnormal; Notable for the following:    Glucose-Capillary 365 (*)     All other components within normal limits  URINALYSIS, ROUTINE W REFLEX MICROSCOPIC - Abnormal; Notable for the following:    APPearance CLOUDY (*)     Glucose, UA >1000 (*)     Hgb urine dipstick SMALL (*)     Ketones, ur 15 (*)     Protein, ur >300 (*)     Urobilinogen, UA 2.0 (*)     Leukocytes, UA SMALL (*)     All other components within normal limits  COMPREHENSIVE METABOLIC PANEL - Abnormal; Notable for the following:    Sodium 129 (*)     Potassium 2.9 (*)     Chloride 89 (*)     Glucose, Bld 361 (*)     Creatinine, Ser 1.22 (*)     Total Protein 8.4 (*)     Albumin 3.4 (*)     AST 43 (*)     Alkaline Phosphatase 220 (*)     GFR calc non Af Amer 42 (*)     GFR calc Af Amer 49 (*)     All other components within normal limits  URINE MICROSCOPIC-ADD ON - Abnormal; Notable for the following:    Squamous Epithelial / LPF MANY (*)     Bacteria, UA MANY (*)     All other components within normal limits  CBC WITH DIFFERENTIAL  LIPASE, BLOOD  POCT I-STAT TROPONIN I   Ct Abdomen Pelvis Wo Contrast  03/08/2012  *RADIOLOGY REPORT*  Clinical Data: Abdominal pain 2 hours ago.  Nausea and vomiting.  CT ABDOMEN AND PELVIS WITHOUT CONTRAST  Technique:  Multidetector CT imaging of the abdomen and pelvis was performed following the standard protocol without intravenous contrast.  Comparison: 04/14/2011  Findings: Clear lung bases.  Mild cardiomegaly, without  pericardial or pleural effusion.  A moderate sized hiatal hernia.  Mild degradation by lack  of IV contrast and patient arm position (not raised above the head).  Normal uninfused appearance of the liver, spleen, distal stomach, pancreas, gallbladder, biliary tract, adrenal glands.  Mild to moderate left perirenal edema with hydroureter.  No ureteric stone.  No retroperitoneal or retrocrural adenopathy.  Extensive colonic diverticulosis.  Apparent sigmoid wall thickening is likely due to muscular hypertrophy and is similar.  Normal terminal ileum.  Normal small bowel without abdominal ascites.  No pelvic adenopathy.  A stone within the right bladder base measures 4 mm.  Left hydroureter continues to the level of the ureterovesicular junction, where there is subtle soft tissue fullness, favored related to edema.  Example image 76.  Hysterectomy.  Pessary. No adnexal mass or significant free fluid. Decreased size of a anterior pelvic wall fluid collection.  This currently measures maximally 3.7 x 1.3 cm on image 64 versus 6.6 x 1.8 cm on the prior exam.  No acute osseous abnormality.  IMPRESSION:  1.  Left-sided urinary tract obstruction, likely related to a recently passed stone, currently positioned in the right side of the bladder. 2. Soft tissue fullness at the left ureter vesicular junction/bladder base is favored to be due to stone passage and secondary edema. If there is persistent hematuria to suggest concurrent bladder lesion, consider urology consultation. 3.  Decreased size of a fluid collection within the anterior pelvic wall. 4.  Multifactorial degradation. 5.  Moderate hiatal hernia.   Original Report Authenticated By: Consuello Bossier, M.D.      No diagnosis found.    MDM  Glenda Gonzalez is a 78 y.o. female who with a history of diverticulitis who presents with acute onset left lower cautery pain after eating. Patient's presentation is concerning for diverticulitis. She's been afebrile, she  appears nontoxic, and her abdomen is benign. Based on the rapid onset of pain, there is some concern with for perforated viscus however - while she does appear uncomfortable she is nontoxic and does not have a surgical abdomen at this time - however there is relative given her age. Patient is also tachycardic and hypertensive. She is on multiple blood pressure medications takes it 3 times daily. Patient was medicated with pain medicine, nausea medicine and fluids. Patient remained hypertensive - despite controlling her pain, was given 5 mg of Lopressor.  Patient's CMP shows a decrease in her sodium, and potassium, this is been seen before with this patient-she is taking furosemide. We'll start repleting her electrolytes. Patient has an elevated glucose however she is not acidotic and not to-a do not think this represents a DKA or an HH [place]. Her CBC test was clotted-I do not C. there is important to repeat this testing. Urinalysis does not suggest a UTI-it does suggest a contamination with vaginal effluence.  CT of the abdomen and pelvis shows the source of the patient's pain, she has mild to moderate left perirenal edema with hydroureter with no ureteric stone. There is a stone within the right bladder base measuring 4 mm.  On reassessment, the patient is completely without pain and is not nauseous.  The perirenal edema and hydroureter are likely secondary to an obstructing stone which is passed into the patient's bladder. She is currently afebrile and feels completely back to normal. We'll give her another dose of potassium by mouth as she is finishing up some IV potassium. Patient has chronic electrode abnormalities - her potassium and sodium are only slightly low and she does take potassium supplements.  Instructed her take her  medications as normally scheduled this evening. She would like to be discharged home. At this point, the patient feels better, she's having no pain, no nausea and she is  afebrile besides being hypertensive, she otherwise has normal vital signs.  Patient will have close followup with her primary care physician, will call the office and see them tomorrow afternoon.  I explained the diagnosis and have given explicit precautions to return to the ER including worsening abdominal pain, vomiting or any other new or worsening symptoms. The patient understands and accepts the medical plan as it's been dictated and I have answered their questions. Discharge instructions concerning home care and prescriptions have been given.  The patient is STABLE and is discharged to home in good condition.           Jones Skene, MD 03/08/12 0128

## 2012-03-23 ENCOUNTER — Encounter (HOSPITAL_COMMUNITY): Payer: Self-pay

## 2012-03-23 ENCOUNTER — Emergency Department (HOSPITAL_COMMUNITY)
Admission: EM | Admit: 2012-03-23 | Discharge: 2012-03-23 | Disposition: A | Discharge status: Home / Self Care | Payer: Medicare Other | Attending: Emergency Medicine | Admitting: Emergency Medicine

## 2012-03-23 ENCOUNTER — Emergency Department (HOSPITAL_COMMUNITY): Payer: Medicare Other

## 2012-03-23 DIAGNOSIS — Z8669 Personal history of other diseases of the nervous system and sense organs: Secondary | ICD-10-CM | POA: Insufficient documentation

## 2012-03-23 DIAGNOSIS — I2510 Atherosclerotic heart disease of native coronary artery without angina pectoris: Secondary | ICD-10-CM | POA: Insufficient documentation

## 2012-03-23 DIAGNOSIS — E119 Type 2 diabetes mellitus without complications: Secondary | ICD-10-CM | POA: Insufficient documentation

## 2012-03-23 DIAGNOSIS — M79604 Pain in right leg: Secondary | ICD-10-CM

## 2012-03-23 DIAGNOSIS — Z79899 Other long term (current) drug therapy: Secondary | ICD-10-CM | POA: Insufficient documentation

## 2012-03-23 DIAGNOSIS — I509 Heart failure, unspecified: Secondary | ICD-10-CM | POA: Insufficient documentation

## 2012-03-23 DIAGNOSIS — F0390 Unspecified dementia without behavioral disturbance: Secondary | ICD-10-CM | POA: Insufficient documentation

## 2012-03-23 DIAGNOSIS — Z8739 Personal history of other diseases of the musculoskeletal system and connective tissue: Secondary | ICD-10-CM | POA: Insufficient documentation

## 2012-03-23 DIAGNOSIS — K219 Gastro-esophageal reflux disease without esophagitis: Secondary | ICD-10-CM | POA: Insufficient documentation

## 2012-03-23 DIAGNOSIS — Z8719 Personal history of other diseases of the digestive system: Secondary | ICD-10-CM | POA: Insufficient documentation

## 2012-03-23 DIAGNOSIS — I10 Essential (primary) hypertension: Secondary | ICD-10-CM | POA: Insufficient documentation

## 2012-03-23 DIAGNOSIS — Z7982 Long term (current) use of aspirin: Secondary | ICD-10-CM | POA: Insufficient documentation

## 2012-03-23 DIAGNOSIS — M79609 Pain in unspecified limb: Secondary | ICD-10-CM | POA: Insufficient documentation

## 2012-03-23 LAB — URINALYSIS, ROUTINE W REFLEX MICROSCOPIC
Bilirubin Urine: NEGATIVE
Nitrite: NEGATIVE
Specific Gravity, Urine: 1.017 (ref 1.005–1.030)
pH: 6.5 (ref 5.0–8.0)

## 2012-03-23 LAB — URINE MICROSCOPIC-ADD ON

## 2012-03-23 MED ORDER — ACETAMINOPHEN 500 MG PO TABS: 500.0000 mg | ORAL_TABLET | Freq: Four times a day (QID) | ORAL | Status: DC | PRN

## 2012-03-23 MED ORDER — ACETAMINOPHEN 325 MG PO TABS
650.0000 mg | ORAL_TABLET | Freq: Once | ORAL | Status: AC
  Administered 2012-03-23: 650 mg via ORAL
  Filled 2012-03-23: qty 2

## 2012-03-23 NOTE — ED Provider Notes (Signed)
History     CSN: [id number]  Arrival date & time 03/23/12  1540   First MD Initiated Contact with Patient 03/23/12 1550      Chief Complaint  Patient presents with  . Leg Pain    (Consider location/radiation/quality/duration/timing/severity/associated sxs/prior treatment) HPI  78 year old female with history of diabetes, gout, and congestive heart failure presents complaining of R leg pain.  patient report for the past 3 days she has had constant pain to the right groin/R leg.  Onset is gradual, persistent, mild/moderate in severity, non radiating, and achy in sensation.  No recent injury, no rash.  Pt first notice pain while laying in bed.  She denies fever, chills, abd pain, or back pain.  She denies leg swelling or recent trauma.  Does endorse urinary frequency but denies burning on urination or vaginal discharge.  Wears adult diaper, is wheel chair bound.  Pt has not notice any increase leg swelling, numbness or weakness.  Has not tried anything to alleviate her sxs.    Past Medical History  Diagnosis Date  . Diabetes mellitus   . GERD (gastroesophageal reflux disease)   . Hypertension   . Diverticulitis   . Gout   . Essential and other specified forms of tremor   . Dementia   . CAD (coronary artery disease)   . CHF (congestive heart failure)     Diastolic dysfunction    Past Surgical History  Procedure Date  . Dilation and curettage of uterus   . Tumor removal     Family History  Problem Relation Age of Onset  . Colon cancer Neg Hx   . Heart disease Father   . Heart disease Brother     History  Substance Use Topics  . Smoking status: Never Smoker   . Smokeless tobacco: Never Used  . Alcohol Use: No    OB History    Grav Para Term Preterm Abortions TAB SAB Ect Mult Living                  Review of Systems  Constitutional: Negative for fever and chills.  Respiratory: Negative for shortness of breath.   Cardiovascular: Negative for chest pain and leg  swelling.  Gastrointestinal: Negative for abdominal pain.  Genitourinary: Positive for frequency. Negative for dysuria, hematuria and vaginal discharge.  Skin: Negative for rash.  Neurological: Negative for numbness.    Allergies  Codeine; Fentanyl; Morphine; and Penicillins  Home Medications   Current Outpatient Rx  Name Route Sig Dispense Refill  . ALLOPURINOL 100 MG PO TABS Oral Take 100 mg by mouth daily.    . ASPIRIN EC 81 MG PO TBEC Oral Take 81 mg by mouth daily.    Marland Kitchen BIMATOPROST 0.03 % OP SOLN Both Eyes Place 1 drop into both eyes 2 (two) times daily.     . DONEPEZIL HCL 10 MG PO TABS Oral Take 10 mg by mouth at bedtime.     Marland Kitchen ESOMEPRAZOLE MAGNESIUM 40 MG PO CPDR Oral Take 40 mg by mouth 2 (two) times daily before a meal.    . FUROSEMIDE 20 MG PO TABS Oral Take 20 mg by mouth daily.    . GLYBURIDE 5 MG PO TABS Oral Take 10 mg by mouth 2 (two) times daily with a meal.     . LOPERAMIDE HCL 2 MG PO CAPS Oral Take 2 mg by mouth daily as needed. For diarrhea    . SYSTANE ULTRA OP Both Eyes Place 1 drop into  both eyes daily as needed. For dry eyes    . POTASSIUM CHLORIDE CRYS ER 10 MEQ PO TBCR Oral Take 10 mEq by mouth 3 (three) times daily.    Marland Kitchen PREGABALIN 50 MG PO CAPS Oral Take 50 mg by mouth 3 (three) times daily.      . TRAMADOL HCL 50 MG PO TABS Oral Take 50 mg by mouth every 6 (six) hours as needed. For pain. Maximum dose= 8 tablets per day    . ZOLPIDEM TARTRATE 5 MG PO TABS Oral Take 5 mg by mouth at bedtime as needed. For sleep      There were no vitals taken for this visit.  Physical Exam  Nursing note and vitals reviewed. Constitutional: She is oriented to person, place, and time. She appears well-developed and well-nourished. No distress.  HENT:  Head: Atraumatic.  Eyes: Conjunctivae normal are normal.  Neck: Neck supple.  Cardiovascular: Intact distal pulses.   Abdominal: Soft. There is no tenderness.  Genitourinary:       Wearing adult diaper    Musculoskeletal: Normal range of motion. She exhibits tenderness (R hip: normal hip flexion, extension, abd/adduction.  mild tenderness to medial aspect of R groin on palpation without lymphadenopathy.  BLE: no palpable cords, erythema, pedal edema, neg homan's sign).  Neurological: She is alert and oriented to person, place, and time. She has normal reflexes.  Skin: Skin is warm. No rash noted.  Psychiatric: She has a normal mood and affect.    ED Course  Procedures (including critical care time)  Labs Reviewed - No data to display No results found.   No diagnosis found.  Results for orders placed during the hospital encounter of 03/23/12  URINALYSIS, ROUTINE W REFLEX MICROSCOPIC      Component Value Range   Color, Urine YELLOW  YELLOW   APPearance HAZY (*) CLEAR   Specific Gravity, Urine 1.017  1.005 - 1.030   pH 6.5  5.0 - 8.0   Glucose, UA >1000 (*) NEGATIVE mg/dL   Hgb urine dipstick SMALL (*) NEGATIVE   Bilirubin Urine NEGATIVE  NEGATIVE   Ketones, ur NEGATIVE  NEGATIVE mg/dL   Protein, ur 30 (*) NEGATIVE mg/dL   Urobilinogen, UA 1.0  0.0 - 1.0 mg/dL   Nitrite NEGATIVE  NEGATIVE   Leukocytes, UA SMALL (*) NEGATIVE  URINE MICROSCOPIC-ADD ON      Component Value Range   Squamous Epithelial / LPF MANY (*) RARE   WBC, UA 7-10  <3 WBC/hpf   RBC / HPF 0-2  <3 RBC/hpf   Bacteria, UA MANY (*) RARE   Ct Abdomen Pelvis Wo Contrast  03/08/2012  *RADIOLOGY REPORT*  Clinical Data: Abdominal pain 2 hours ago.  Nausea and vomiting.  CT ABDOMEN AND PELVIS WITHOUT CONTRAST  Technique:  Multidetector CT imaging of the abdomen and pelvis was performed following the standard protocol without intravenous contrast.  Comparison: 04/14/2011  Findings: Clear lung bases.  Mild cardiomegaly, without pericardial or pleural effusion.  A moderate sized hiatal hernia.  Mild degradation by lack of IV contrast and patient arm position (not raised above the head).  Normal uninfused appearance of the  liver, spleen, distal stomach, pancreas, gallbladder, biliary tract, adrenal glands.  Mild to moderate left perirenal edema with hydroureter.  No ureteric stone.  No retroperitoneal or retrocrural adenopathy.  Extensive colonic diverticulosis.  Apparent sigmoid wall thickening is likely due to muscular hypertrophy and is similar.  Normal terminal ileum.  Normal small bowel without abdominal ascites.  No pelvic adenopathy.  A stone within the right bladder base measures 4 mm.  Left hydroureter continues to the level of the ureterovesicular junction, where there is subtle soft tissue fullness, favored related to edema.  Example image 76.  Hysterectomy.  Pessary. No adnexal mass or significant free fluid. Decreased size of a anterior pelvic wall fluid collection.  This currently measures maximally 3.7 x 1.3 cm on image 64 versus 6.6 x 1.8 cm on the prior exam.  No acute osseous abnormality.  IMPRESSION:  1.  Left-sided urinary tract obstruction, likely related to a recently passed stone, currently positioned in the right side of the bladder. 2. Soft tissue fullness at the left ureter vesicular junction/bladder base is favored to be due to stone passage and secondary edema. If there is persistent hematuria to suggest concurrent bladder lesion, consider urology consultation. 3.  Decreased size of a fluid collection within the anterior pelvic wall. 4.  Multifactorial degradation. 5.  Moderate hiatal hernia.   Original Report Authenticated By: Consuello Bossier, M.D.    Dg Hip Complete Right  03/23/2012  *RADIOLOGY REPORT*  Clinical Data: Leg pain  RIGHT HIP - COMPLETE 2+ VIEW  Comparison: None  Findings: Mild osteoarthritis involves the right hip.  There is no evidence for acute fracture or subluxation.  No radiopaque foreign bodies identified.  IMPRESSION:  1.  No acute findings. 2.  Mild osteoarthritis.   Original Report Authenticated By: Rosealee Albee, M.D.     1. R leg pain  MDM  R hip, thigh pain.  Normal  ROM on exam, no signs of infection.  No evidence to suggest DVT.  I discussed with my attending who recommend R hip xray and UA.  Pt otherwise in NAD  6:48 PM Xray and UA shows no acute finding.  Pt has hx of diabetes and has not taken her meds today.  She request to be discharge.  I recommend f/u with PCP for further care, recommend tylenol for pain.  Strict return precaution including fever,rash, swelling, or numbness.  Pt voice understanding.     BP 150/91  Pulse 84  Temp 98.8 F (37.1 C) (Oral)  Resp 18  SpO2 97%  I have reviewed nursing notes and vital signs. I personally reviewed the imaging tests through PACS system  I reviewed available ER/hospitalization records thought the EMR      Fayrene Helper, PA-C 03/23/12 1849  Fayrene Helper, PA-C 03/23/12 1851

## 2012-03-23 NOTE — ED Notes (Signed)
PA Tran at bedside. 

## 2012-03-23 NOTE — ED Provider Notes (Signed)
Medical screening examination/treatment/procedure(s) were performed by non-physician practitioner and as supervising physician I was immediately available for consultation/collaboration.    Nelia Shi, MD 03/23/12 2312

## 2012-03-24 LAB — URINE CULTURE

## 2012-05-26 DIAGNOSIS — I639 Cerebral infarction, unspecified: Secondary | ICD-10-CM

## 2012-05-26 HISTORY — DX: Cerebral infarction, unspecified: I63.9

## 2012-06-08 ENCOUNTER — Emergency Department (HOSPITAL_COMMUNITY): Payer: Medicare Other

## 2012-06-08 ENCOUNTER — Encounter (HOSPITAL_COMMUNITY): Payer: Self-pay

## 2012-06-08 ENCOUNTER — Inpatient Hospital Stay (HOSPITAL_COMMUNITY)
Admission: EM | Admit: 2012-06-08 | Discharge: 2012-06-16 | DRG: 208 | Disposition: A | Discharge status: Rehabilitation Facility | Payer: Medicare Other | Attending: Internal Medicine | Admitting: Internal Medicine

## 2012-06-08 DIAGNOSIS — F068 Other specified mental disorders due to known physiological condition: Secondary | ICD-10-CM

## 2012-06-08 DIAGNOSIS — G252 Other specified forms of tremor: Secondary | ICD-10-CM

## 2012-06-08 DIAGNOSIS — E119 Type 2 diabetes mellitus without complications: Secondary | ICD-10-CM

## 2012-06-08 DIAGNOSIS — W19XXXA Unspecified fall, initial encounter: Secondary | ICD-10-CM

## 2012-06-08 DIAGNOSIS — N390 Urinary tract infection, site not specified: Secondary | ICD-10-CM

## 2012-06-08 DIAGNOSIS — I739 Peripheral vascular disease, unspecified: Secondary | ICD-10-CM

## 2012-06-08 DIAGNOSIS — G40401 Other generalized epilepsy and epileptic syndromes, not intractable, with status epilepticus: Secondary | ICD-10-CM

## 2012-06-08 DIAGNOSIS — E871 Hypo-osmolality and hyponatremia: Secondary | ICD-10-CM

## 2012-06-08 DIAGNOSIS — I6350 Cerebral infarction due to unspecified occlusion or stenosis of unspecified cerebral artery: Secondary | ICD-10-CM | POA: Diagnosis present

## 2012-06-08 DIAGNOSIS — I509 Heart failure, unspecified: Secondary | ICD-10-CM

## 2012-06-08 DIAGNOSIS — G9340 Encephalopathy, unspecified: Secondary | ICD-10-CM

## 2012-06-08 DIAGNOSIS — I447 Left bundle-branch block, unspecified: Secondary | ICD-10-CM | POA: Diagnosis present

## 2012-06-08 DIAGNOSIS — I10 Essential (primary) hypertension: Secondary | ICD-10-CM

## 2012-06-08 DIAGNOSIS — E876 Hypokalemia: Secondary | ICD-10-CM

## 2012-06-08 DIAGNOSIS — J9600 Acute respiratory failure, unspecified whether with hypoxia or hypercapnia: Principal | ICD-10-CM

## 2012-06-08 DIAGNOSIS — I2510 Atherosclerotic heart disease of native coronary artery without angina pectoris: Secondary | ICD-10-CM

## 2012-06-08 DIAGNOSIS — E162 Hypoglycemia, unspecified: Secondary | ICD-10-CM

## 2012-06-08 DIAGNOSIS — R197 Diarrhea, unspecified: Secondary | ICD-10-CM

## 2012-06-08 DIAGNOSIS — A0472 Enterocolitis due to Clostridium difficile, not specified as recurrent: Secondary | ICD-10-CM

## 2012-06-08 DIAGNOSIS — K219 Gastro-esophageal reflux disease without esophagitis: Secondary | ICD-10-CM

## 2012-06-08 DIAGNOSIS — R739 Hyperglycemia, unspecified: Secondary | ICD-10-CM

## 2012-06-08 DIAGNOSIS — R7309 Other abnormal glucose: Secondary | ICD-10-CM

## 2012-06-08 DIAGNOSIS — M109 Gout, unspecified: Secondary | ICD-10-CM

## 2012-06-08 DIAGNOSIS — G40901 Epilepsy, unspecified, not intractable, with status epilepticus: Secondary | ICD-10-CM

## 2012-06-08 DIAGNOSIS — R531 Weakness: Secondary | ICD-10-CM

## 2012-06-08 DIAGNOSIS — Z7982 Long term (current) use of aspirin: Secondary | ICD-10-CM

## 2012-06-08 DIAGNOSIS — M1990 Unspecified osteoarthritis, unspecified site: Secondary | ICD-10-CM

## 2012-06-08 DIAGNOSIS — I5032 Chronic diastolic (congestive) heart failure: Secondary | ICD-10-CM | POA: Diagnosis present

## 2012-06-08 DIAGNOSIS — F0390 Unspecified dementia without behavioral disturbance: Secondary | ICD-10-CM | POA: Diagnosis present

## 2012-06-08 LAB — CBC WITH DIFFERENTIAL/PLATELET
Basophils Absolute: 0.1 10*3/uL (ref 0.0–0.1)
Basophils Relative: 1 % (ref 0–1)
Eosinophils Absolute: 0.2 10*3/uL (ref 0.0–0.7)
MCH: 27.2 pg (ref 26.0–34.0)
MCHC: 33.2 g/dL (ref 30.0–36.0)
Neutrophils Relative %: 61 % (ref 43–77)
Platelets: 251 10*3/uL (ref 150–400)
RBC: 4.48 MIL/uL (ref 3.87–5.11)
RDW: 17.4 % — ABNORMAL HIGH (ref 11.5–15.5)

## 2012-06-08 LAB — POCT I-STAT TROPONIN I

## 2012-06-08 MED ORDER — HYDROMORPHONE HCL PF 1 MG/ML IJ SOLN
1.0000 mg | Freq: Once | INTRAMUSCULAR | Status: AC
  Administered 2012-06-08: 1 mg via INTRAVENOUS

## 2012-06-08 MED ORDER — LORAZEPAM 2 MG/ML IJ SOLN
INTRAMUSCULAR | Status: AC
  Filled 2012-06-08: qty 1

## 2012-06-08 MED ORDER — LORAZEPAM 2 MG/ML IJ SOLN
1.0000 mg | Freq: Once | INTRAMUSCULAR | Status: AC
  Administered 2012-06-08: 1 mg via INTRAVENOUS

## 2012-06-08 MED ORDER — PROPOFOL 10 MG/ML IV BOLUS
20.0000 mg | Freq: Once | INTRAVENOUS | Status: AC
  Administered 2012-06-08: 20 mg via INTRAVENOUS

## 2012-06-08 MED ORDER — PROPOFOL 10 MG/ML IV EMUL
INTRAVENOUS | Status: AC
  Administered 2012-06-08: 10 ug/kg/min via INTRAVENOUS
  Filled 2012-06-08: qty 100

## 2012-06-08 MED ORDER — SODIUM CHLORIDE 0.9 % IV SOLN: 1000.0000 mL | Freq: Once | INTRAVENOUS | Status: DC

## 2012-06-08 MED ORDER — HYDROMORPHONE HCL PF 1 MG/ML IJ SOLN
INTRAMUSCULAR | Status: AC
  Administered 2012-06-08: 1 mg via INTRAVENOUS
  Filled 2012-06-08: qty 1

## 2012-06-08 MED ORDER — ETOMIDATE 2 MG/ML IV SOLN
20.0000 mg | Freq: Once | INTRAVENOUS | Status: AC
  Administered 2012-06-08: 20 mg via INTRAVENOUS

## 2012-06-08 MED ORDER — SUCCINYLCHOLINE CHLORIDE 20 MG/ML IJ SOLN
100.0000 mg | Freq: Once | INTRAMUSCULAR | Status: AC
  Administered 2012-06-08: 100 mg via INTRAVENOUS

## 2012-06-08 MED ORDER — PROPOFOL 10 MG/ML IV EMUL
5.0000 ug/kg/min | INTRAVENOUS | Status: DC
  Administered 2012-06-08: 10 ug/kg/min via INTRAVENOUS
  Administered 2012-06-09: 20 ug/kg/min via INTRAVENOUS
  Administered 2012-06-09 (×2): 35 ug/kg/min via INTRAVENOUS
  Administered 2012-06-09: 25 ug/kg/min via INTRAVENOUS
  Administered 2012-06-10: 35 ug/kg/min via INTRAVENOUS
  Administered 2012-06-10: 20 ug/kg/min via INTRAVENOUS
  Administered 2012-06-10: 40 ug/kg/min via INTRAVENOUS
  Filled 2012-06-08 (×7): qty 100

## 2012-06-08 MED ORDER — SODIUM CHLORIDE 0.9 % IV SOLN: 1000.0000 mL | INTRAVENOUS | Status: DC

## 2012-06-08 MED ORDER — PROPOFOL 10 MG/ML IV EMUL
5.0000 ug/kg/min | INTRAVENOUS | Status: DC
  Administered 2012-06-08: 10 ug/kg/min via INTRAVENOUS

## 2012-06-08 NOTE — ED Notes (Signed)
PCXR done for ETT placement.

## 2012-06-08 NOTE — H&P (Signed)
PULMONARY  / CRITICAL CARE MEDICINE  Name: Glenda Gonzalez MRN: [id number] DOB: 10-11-1933    LOS: 1  REFERRING MD:  EDP  CHIEF COMPLAINT:   Found down  BRIEF PATIENT DESCRIPTION: 79 yo with DM, CAD, CHF found down, frothing from her mouth, with possible seizure activity. In ED: hypertensive, intubated for airway protection.  LINES / TUBES: OETT 1/14 >>> OGT 1/14 >>> Foley 1/14 >>>  CULTURES: 1/14  Blood >>>  ANTIBIOTICS:  SIGNIFICANT EVENTS:  1/14 >>> Found down, frothing from her mouth, with possible seizure activity, hypertensive, intubated for airway protection.  LEVEL OF CARE:  ICU  PRIMARY SERVICE:  PCCM  CONSULTANTS:    CODE STATUS:  Full  DIET:  NPO  DVT Px:  Heparin  GI Px:  Protonix  The patient is encephalopathic and unable to provide history, which was obtained for available medical records.  HISTORY OF PRESENT ILLNESS:  79 yo with DM, CAD, CHF found down, frothing from her mouth, with possible seizure activity. In ED: hypertensive, intubated for airway protection.  PAST MEDICAL HISTORY :  Past Medical History  Diagnosis Date  . Diabetes mellitus   . GERD (gastroesophageal reflux disease)   . Hypertension   . Diverticulitis   . Gout   . Essential and other specified forms of tremor   . Dementia   . CAD (coronary artery disease)   . CHF (congestive heart failure)     Diastolic dysfunction   Past Surgical History  Procedure Date  . Dilation and curettage of uterus   . Tumor removal    Prior to Admission medications   Medication Sig Start Date End Date Taking? Authorizing Provider  acetaminophen (TYLENOL) 500 MG tablet Take 1 tablet (500 mg total) by mouth every 6 (six) hours as needed for pain. 03/23/12   Fayrene Helper, PA-C  allopurinol (ZYLOPRIM) 100 MG tablet Take 100 mg by mouth daily.    Historical Provider, MD  aspirin EC 81 MG tablet Take 81 mg by mouth daily.    Historical Provider, MD  bimatoprost (LUMIGAN) 0.03 % ophthalmic  solution Place 1 drop into both eyes 2 (two) times daily.     Historical Provider, MD  donepezil (ARICEPT) 10 MG tablet Take 10 mg by mouth at bedtime.     Historical Provider, MD  esomeprazole (NEXIUM) 40 MG capsule Take 40 mg by mouth 2 (two) times daily before a meal. 06/17/11 06/16/12  Rachael Fee, MD  furosemide (LASIX) 20 MG tablet Take 20 mg by mouth daily.    Historical Provider, MD  glyBURIDE (DIABETA) 5 MG tablet Take 10 mg by mouth 2 (two) times daily with a meal.     Historical Provider, MD  loperamide (IMODIUM) 2 MG capsule Take 2 mg by mouth daily as needed. For diarrhea    Historical Provider, MD  Polyethyl Glycol-Propyl Glycol (SYSTANE ULTRA OP) Place 1 drop into both eyes daily as needed. For dry eyes    Historical Provider, MD  potassium chloride (K-DUR,KLOR-CON) 10 MEQ tablet Take 10 mEq by mouth 3 (three) times daily. 07/24/11 07/23/12  Maryruth Bun Rama, MD  pregabalin (LYRICA) 50 MG capsule Take 50 mg by mouth 3 (three) times daily.      Historical Provider, MD  traMADol (ULTRAM) 50 MG tablet Take 50 mg by mouth every 6 (six) hours as needed. For pain. Maximum dose= 8 tablets per day    Historical Provider, MD  zolpidem (AMBIEN) 5 MG tablet Take 5 mg by mouth  at bedtime as needed. For sleep    Historical Provider, MD   Allergies  Allergen Reactions  . Codeine Other (See Comments)    hallucination  . Fentanyl Other (See Comments)    Abnormal behavior per husband  . Morphine Itching  . Penicillins Itching   FAMILY HISTORY:  Family History  Problem Relation Age of Onset  . Colon cancer Neg Hx   . Heart disease Father   . Heart disease Brother    SOCIAL HISTORY:  reports that she has never smoked. She has never used smokeless tobacco. She reports that she does not drink alcohol or use illicit drugs.  REVIEW OF SYSTEMS:  Unable to provide.  INTERVAL HISTORY:  VITAL SIGNS: Temp:  [98.4 F (36.9 C)-98.6 F (37 C)] 98.4 F (36.9 C) (01/15 0022) Pulse Rate:   [107-121] 107  (01/15 0022) Resp:  [16] 16  (01/15 0022) BP: (143-191)/(91-151) 143/91 mmHg (01/14 2335) SpO2:  [97 %-100 %] 100 % (01/15 0022) Weight:  [66.2 kg (145 lb 15.1 oz)] 66.2 kg (145 lb 15.1 oz) (01/14 2335) HEMODYNAMICS:   VENTILATOR SETTINGS:   INTAKE / OUTPUT: Intake/Output    None     PHYSICAL EXAMINATION: General:  Appears acutely ill, mechanically ventilated, synchronous Neuro:  Encephalopathic, nonfocal, cough / gag diminished HEENT:  PERRL, OETT / OGT Cardiovascular:  RRR, no m/r/g Lungs:  Bilateral diminished air entry, scattered rhonchi Abdomen:  Obese, soft Musculoskeletal:  Bilateral pedal edema Skin:  Intact  LABS:  Lab 06/08/12 2347 06/08/12 2309  HGB -- 12.2  WBC -- 9.1  PLT -- 251  NA -- 133*  K -- 3.4*  CL -- 91*  CO2 -- 26  GLUCOSE -- 398*  BUN -- 13  CREATININE -- 1.06  CALCIUM -- 9.5  MG -- --  PHOS -- --  AST -- 60*  ALT -- 25  ALKPHOS -- 220*  BILITOT -- 0.3  PROT -- 8.5*  ALBUMIN -- 3.3*  APTT -- 37  INR -- 1.10  LATICACIDVEN 2.6* --  TROPONINI -- --  PROCALCITON -- --  PROBNP -- 234.3  O2SATVEN -- --  PHART -- --  PCO2ART -- --  PO2ART -- --   No results found for this basename: GLUCAP:5 in the last 168 hours  IMAGING: 1/14  PCXR >>> Low ETT, possible LLL airspace disease 1/14  Head CT >>> preliminary, nad  ECG: 1/14  12-lead ECG >>>  Sinus tachycardia, 1 degree AV block, new LBBB (not present in October 2013).  DIAGNOSES: Active Problems:  DM  HYPERTENSION  CONGESTIVE HEART FAILURE UNSPECIFIED  Acute encephalopathy  Acute respiratory failure  Status epilepticus  Hyperglycemia  ASSESSMENT / PLAN:  PULMONARY  A:  Acute respiratory failure in setting of status epilepticus and possible acute pulmonary edema.  Possible aspiration, but CXR is underwhelming. P:   Gaol SpO2>92, pH>7.30 Full mechanical support Daily SBT Trend ABG / CXR  CARDIOVASCULAR  A: History of CHF.  Possible CHF exacerbation with  resultant pulmonary edema.  Possible hypertensive emergency.  New LBBB. P:  Goal SBP < 180 and DBP < 105 Lasix 40 IV q12h x 2 days Continue ASA Labetalol PRN Trend Troponin 2D echo in AM  RENAL  A:  Normal renal function.  Hypokalemia. P:   Place CVL Check CVP Trend BMP Check Mg KCl 10 x 6  GASTROINTESTINAL  A:  No active issues. P:   NPO as intubated TF if remains intubated > 24 hours  HEMATOLOGIC  A:  NO active issues. P:  Trend CBC  INFECTIOUS  A:  No evidence of acute infection. P:   Blood cultures UA Defer abx  ENDOCRINE   A:  DM.  Hyperglycemia.   P:   ICU Glycemic Control Protocol, Phase 1 Hold Diabeta  NEUROLOGIC  A:  Acute encephalopathy.  Possible status epilepticus.  Dementia. P:   Goal RASS 0 to -1 Propofol gtt Keppra Hold Aricept, Lyrica, Ultram, Ambien Drug screen EEG in AM  CLINICAL SUMMARY: 79 yo with DM, CAD, CHF found down, frothing from her mouth, with possible seizure activity. In ED: hypertensive, intubated for airway protection.  Mechanical ventilation overnight.  Keppra / Propofol.  Diuresis.  Antibiotics deferred.  EEG in AM. New LBBB, will trend cardiac enzymes.  I have personally obtained a history, examined the patient, evaluated laboratory and imaging results, formulated the assessment and plan and placed orders.  CRITICAL CARE:  The patient is critically ill with multiple organ systems failure and requires high complexity decision making for assessment and support, frequent evaluation and titration of therapies, application of advanced monitoring technologies and extensive interpretation of multiple databases. Critical Care Time devoted to patient care services described in this note is 45 minutes.   Lonia Farber, MD  Pulmonary and Critical Care Medicine [facility] Pager: [phone]  06/09/2012, 12:26 AM

## 2012-06-08 NOTE — ED Notes (Addendum)
7.5 ETT inserted per Dr.Lockwood with minimal difficulty - placement verified with positive color change on capnometer, symmetrical rise and fall of chest, BBS aucultated bilat.; tube taped at 25 at lip; placed on vent with settings as follows: TV 500 FIO2 100%; AC 16; PEEP 5

## 2012-06-09 ENCOUNTER — Inpatient Hospital Stay (HOSPITAL_COMMUNITY): Payer: Medicare Other

## 2012-06-09 ENCOUNTER — Emergency Department (HOSPITAL_COMMUNITY): Payer: Medicare Other

## 2012-06-09 DIAGNOSIS — R739 Hyperglycemia, unspecified: Secondary | ICD-10-CM

## 2012-06-09 DIAGNOSIS — I509 Heart failure, unspecified: Secondary | ICD-10-CM

## 2012-06-09 DIAGNOSIS — G40901 Epilepsy, unspecified, not intractable, with status epilepticus: Secondary | ICD-10-CM

## 2012-06-09 DIAGNOSIS — J9600 Acute respiratory failure, unspecified whether with hypoxia or hypercapnia: Principal | ICD-10-CM

## 2012-06-09 DIAGNOSIS — G9340 Encephalopathy, unspecified: Secondary | ICD-10-CM

## 2012-06-09 DIAGNOSIS — R197 Diarrhea, unspecified: Secondary | ICD-10-CM

## 2012-06-09 DIAGNOSIS — A0472 Enterocolitis due to Clostridium difficile, not specified as recurrent: Secondary | ICD-10-CM

## 2012-06-09 LAB — GLUCOSE, CAPILLARY
Glucose-Capillary: 110 mg/dL — ABNORMAL HIGH (ref 70–99)
Glucose-Capillary: 127 mg/dL — ABNORMAL HIGH (ref 70–99)
Glucose-Capillary: 145 mg/dL — ABNORMAL HIGH (ref 70–99)
Glucose-Capillary: 202 mg/dL — ABNORMAL HIGH (ref 70–99)
Glucose-Capillary: 215 mg/dL — ABNORMAL HIGH (ref 70–99)
Glucose-Capillary: 228 mg/dL — ABNORMAL HIGH (ref 70–99)
Glucose-Capillary: 260 mg/dL — ABNORMAL HIGH (ref 70–99)
Glucose-Capillary: 336 mg/dL — ABNORMAL HIGH (ref 70–99)
Glucose-Capillary: 379 mg/dL — ABNORMAL HIGH (ref 70–99)

## 2012-06-09 LAB — URINALYSIS, ROUTINE W REFLEX MICROSCOPIC
Glucose, UA: >1000 mg/dL — AB
Leukocytes, UA: NEGATIVE
Specific Gravity, Urine: 1.022 (ref 1.005–1.030)
Urobilinogen, UA: 0.2 mg/dL (ref 0.0–1.0)

## 2012-06-09 LAB — URINE MICROSCOPIC-ADD ON

## 2012-06-09 LAB — COMPREHENSIVE METABOLIC PANEL
ALT: 25 U/L (ref 0–35)
Albumin: 3.3 g/dL — ABNORMAL LOW (ref 3.5–5.2)
Alkaline Phosphatase: 220 U/L — ABNORMAL HIGH (ref 39–117)
Potassium: 3.4 mEq/L — ABNORMAL LOW (ref 3.5–5.1)
Sodium: 133 mEq/L — ABNORMAL LOW (ref 135–145)
Total Protein: 8.5 g/dL — ABNORMAL HIGH (ref 6.0–8.3)

## 2012-06-09 LAB — BASIC METABOLIC PANEL
CO2: 28 mEq/L (ref 19–32)
Calcium: 9 mg/dL (ref 8.4–10.5)
Creatinine, Ser: 1.07 mg/dL (ref 0.50–1.10)
GFR calc non Af Amer: 49 mL/min — ABNORMAL LOW (ref >90)
Glucose, Bld: 172 mg/dL — ABNORMAL HIGH (ref 70–99)

## 2012-06-09 LAB — POCT I-STAT 3, VENOUS BLOOD GAS (G3P V)
Acid-Base Excess: 1 mmol/L (ref 0.0–2.0)
Bicarbonate: 30.1 meq/L — ABNORMAL HIGH (ref 20.0–24.0)
O2 Saturation: 70 %
TCO2: 32 mmol/L (ref 0–100)
pCO2, Ven: 64.9 mmHg — ABNORMAL HIGH (ref 45.0–50.0)
pH, Ven: 7.274 (ref 7.250–7.300)
pO2, Ven: 43 mmHg (ref 30.0–45.0)

## 2012-06-09 LAB — PHOSPHORUS: Phosphorus: 3.1 mg/dL (ref 2.3–4.6)

## 2012-06-09 LAB — BLOOD GAS, ARTERIAL
Bicarbonate: 25.9 mEq/L — ABNORMAL HIGH (ref 20.0–24.0)
Drawn by: 24513
O2 Saturation: 99.5 %
PEEP: 5 cmH2O
RATE: 16 resp/min
pH, Arterial: 7.342 — ABNORMAL LOW (ref 7.350–7.450)
pO2, Arterial: 398 mmHg — ABNORMAL HIGH (ref 80.0–100.0)

## 2012-06-09 LAB — PROTIME-INR: Prothrombin Time: 14.1 seconds (ref 11.6–15.2)

## 2012-06-09 LAB — MAGNESIUM: Magnesium: 1.6 mg/dL (ref 1.5–2.5)

## 2012-06-09 LAB — TROPONIN I: Troponin I: <0.3 ng/mL (ref <0.30)

## 2012-06-09 MED ORDER — SODIUM CHLORIDE 0.9 % IV SOLN
1000.0000 mg | Freq: Once | INTRAVENOUS | Status: AC
  Administered 2012-06-09: 1000 mg via INTRAVENOUS
  Filled 2012-06-09: qty 10

## 2012-06-09 MED ORDER — SODIUM CHLORIDE 0.9 % IV SOLN
INTRAVENOUS | Status: DC
  Filled 2012-06-09: qty 1

## 2012-06-09 MED ORDER — SODIUM CHLORIDE 0.9 % IV SOLN
INTRAVENOUS | Status: DC
  Administered 2012-06-09 – 2012-06-10 (×2): via INTRAVENOUS

## 2012-06-09 MED ORDER — HEPARIN SODIUM (PORCINE) 5000 UNIT/ML IJ SOLN
5000.0000 [IU] | Freq: Three times a day (TID) | INTRAMUSCULAR | Status: DC
  Administered 2012-06-09 – 2012-06-16 (×23): 5000 [IU] via SUBCUTANEOUS
  Filled 2012-06-09 (×25): qty 1

## 2012-06-09 MED ORDER — LEVETIRACETAM 500 MG/5ML IV SOLN
1000.0000 mg | Freq: Two times a day (BID) | INTRAVENOUS | Status: DC
  Administered 2012-06-09 – 2012-06-10 (×3): 1000 mg via INTRAVENOUS
  Filled 2012-06-09 (×4): qty 10

## 2012-06-09 MED ORDER — BIMATOPROST 0.03 % OP SOLN
1.0000 [drp] | Freq: Two times a day (BID) | OPHTHALMIC | Status: DC
  Filled 2012-06-09: qty 2.5

## 2012-06-09 MED ORDER — POTASSIUM CHLORIDE 10 MEQ/50ML IV SOLN
10.0000 meq | INTRAVENOUS | Status: AC
  Administered 2012-06-09 (×4): 10 meq via INTRAVENOUS
  Filled 2012-06-09 (×4): qty 50

## 2012-06-09 MED ORDER — LABETALOL HCL 5 MG/ML IV SOLN
10.0000 mg | INTRAVENOUS | Status: DC | PRN
  Administered 2012-06-10 – 2012-06-14 (×4): 10 mg via INTRAVENOUS
  Filled 2012-06-09 (×4): qty 4

## 2012-06-09 MED ORDER — ADULT MULTIVITAMIN LIQUID CH
5.0000 mL | Freq: Every day | ORAL | Status: DC
  Administered 2012-06-10 – 2012-06-16 (×4): 5 mL
  Filled 2012-06-09 (×9): qty 5

## 2012-06-09 MED ORDER — CHLORHEXIDINE GLUCONATE 0.12 % MT SOLN
15.0000 mL | Freq: Two times a day (BID) | OROMUCOSAL | Status: DC
  Administered 2012-06-09 – 2012-06-14 (×11): 15 mL via OROMUCOSAL
  Filled 2012-06-09 (×12): qty 15

## 2012-06-09 MED ORDER — PROMOTE PO LIQD
1000.0000 mL | ORAL | Status: DC
  Administered 2012-06-09 – 2012-06-10 (×2): 1000 mL
  Filled 2012-06-09 (×8): qty 1000

## 2012-06-09 MED ORDER — BIMATOPROST 0.01 % OP SOLN
1.0000 [drp] | Freq: Two times a day (BID) | OPHTHALMIC | Status: DC
  Administered 2012-06-09 – 2012-06-16 (×15): 1 [drp] via OPHTHALMIC
  Filled 2012-06-09 (×2): qty 2.5

## 2012-06-09 MED ORDER — INSULIN ASPART 100 UNIT/ML ~~LOC~~ SOLN: 2.0000 [IU] | SUBCUTANEOUS | Status: DC

## 2012-06-09 MED ORDER — POTASSIUM CHLORIDE 10 MEQ/50ML IV SOLN
10.0000 meq | INTRAVENOUS | Status: DC
  Administered 2012-06-09 (×2): 10 meq via INTRAVENOUS
  Filled 2012-06-09 (×2): qty 50

## 2012-06-09 MED ORDER — SODIUM CHLORIDE 0.9 % IV SOLN
INTRAVENOUS | Status: DC
  Administered 2012-06-09: 3.2 [IU]/h via INTRAVENOUS
  Administered 2012-06-09: 3.8 [IU]/h via INTRAVENOUS
  Filled 2012-06-09 (×3): qty 1

## 2012-06-09 MED ORDER — ASPIRIN 325 MG PO TABS
325.0000 mg | ORAL_TABLET | Freq: Every day | ORAL | Status: DC
  Administered 2012-06-09 – 2012-06-10 (×2): 325 mg via ORAL
  Filled 2012-06-09 (×4): qty 1

## 2012-06-09 MED ORDER — PANTOPRAZOLE SODIUM 40 MG IV SOLR
40.0000 mg | INTRAVENOUS | Status: DC
  Administered 2012-06-09 – 2012-06-12 (×4): 40 mg via INTRAVENOUS
  Filled 2012-06-09 (×6): qty 40

## 2012-06-09 MED ORDER — PRO-STAT SUGAR FREE PO LIQD
30.0000 mL | Freq: Every day | ORAL | Status: DC
  Administered 2012-06-09 – 2012-06-14 (×15): 30 mL
  Filled 2012-06-09 (×29): qty 30

## 2012-06-09 MED ORDER — OSMOLITE 1.2 CAL PO LIQD
1000.0000 mL | ORAL | Status: DC
  Filled 2012-06-09 (×2): qty 1000

## 2012-06-09 MED ORDER — FUROSEMIDE 10 MG/ML IJ SOLN
40.0000 mg | Freq: Two times a day (BID) | INTRAMUSCULAR | Status: AC
  Administered 2012-06-09 – 2012-06-10 (×3): 40 mg via INTRAVENOUS
  Filled 2012-06-09 (×3): qty 4

## 2012-06-09 MED ORDER — BIOTENE DRY MOUTH MT LIQD
1.0000 "application " | Freq: Four times a day (QID) | OROMUCOSAL | Status: DC
  Administered 2012-06-09 – 2012-06-14 (×22): 15 mL via OROMUCOSAL

## 2012-06-09 NOTE — Progress Notes (Signed)
CCM notified of patient's low urine output and CVP of 3. NS ordered at 50/hr. Will continue to assess.

## 2012-06-09 NOTE — ED Notes (Signed)
All home meds stored in pharmacy per Eual Fines

## 2012-06-09 NOTE — Progress Notes (Signed)
Portable EEG completed

## 2012-06-09 NOTE — ED Provider Notes (Signed)
History     CSN: [id number]  Arrival date & time 06/08/12  2258   First MD Initiated Contact with Patient 06/08/12 2307      Chief Complaint  Patient presents with  . Seizures    (Consider location/radiation/quality/duration/timing/severity/associated sxs/prior treatment) HPI  The patient presents in extremis via EMS.  Per report the patient was found unresponsive by family members.  On EMS arrival the patient had no response to painful or verbal stimuli, was actively seizing.  EMS attempted intubation without success, attempted IV placement without success. They state that en route the patient was hypertensive, tachycardic, tachypneic, with persistent frothy discharge from her mouth. On arrival the patient is incapable of providing any details of history of present illness history to her clinical status.  As a level V caveat.   Past Medical History  Diagnosis Date  . Diabetes mellitus   . GERD (gastroesophageal reflux disease)   . Hypertension   . Diverticulitis   . Gout   . Essential and other specified forms of tremor   . Dementia   . CAD (coronary artery disease)   . CHF (congestive heart failure)     Diastolic dysfunction    Past Surgical History  Procedure Date  . Dilation and curettage of uterus   . Tumor removal     Family History  Problem Relation Age of Onset  . Colon cancer Neg Hx   . Heart disease Father   . Heart disease Brother     History  Substance Use Topics  . Smoking status: Never Smoker   . Smokeless tobacco: Never Used  . Alcohol Use: No    OB History    Grav Para Term Preterm Abortions TAB SAB Ect Mult Living                  Review of Systems  Unable to perform ROS: Mental status change    Allergies  Codeine; Fentanyl; Morphine; and Penicillins  Home Medications   Current Outpatient Rx  Name  Route  Sig  Dispense  Refill  . ACETAMINOPHEN 500 MG PO TABS   Oral   Take 1 tablet (500 mg total) by mouth every 6 (six) hours  as needed for pain.   30 tablet   0   . ALLOPURINOL 100 MG PO TABS   Oral   Take 100 mg by mouth daily.         . ASPIRIN EC 81 MG PO TBEC   Oral   Take 81 mg by mouth daily.         Marland Kitchen BIMATOPROST 0.03 % OP SOLN   Both Eyes   Place 1 drop into both eyes 2 (two) times daily.          . DONEPEZIL HCL 10 MG PO TABS   Oral   Take 10 mg by mouth at bedtime.          Marland Kitchen ESOMEPRAZOLE MAGNESIUM 40 MG PO CPDR   Oral   Take 40 mg by mouth 2 (two) times daily before a meal.         . FUROSEMIDE 20 MG PO TABS   Oral   Take 20 mg by mouth daily.         . GLYBURIDE 5 MG PO TABS   Oral   Take 10 mg by mouth 2 (two) times daily with a meal.          . LOPERAMIDE HCL 2 MG PO CAPS  Oral   Take 2 mg by mouth daily as needed. For diarrhea         . SYSTANE ULTRA OP   Both Eyes   Place 1 drop into both eyes daily as needed. For dry eyes         . POTASSIUM CHLORIDE CRYS ER 10 MEQ PO TBCR   Oral   Take 10 mEq by mouth 3 (three) times daily.         Marland Kitchen PREGABALIN 50 MG PO CAPS   Oral   Take 50 mg by mouth 3 (three) times daily.           . TRAMADOL HCL 50 MG PO TABS   Oral   Take 50 mg by mouth every 6 (six) hours as needed. For pain. Maximum dose= 8 tablets per day         . ZOLPIDEM TARTRATE 5 MG PO TABS   Oral   Take 5 mg by mouth at bedtime as needed. For sleep           BP 143/91  Pulse 112  Temp 98.6 F (37 C) (Core (Comment))  Resp 16  Wt 145 lb 15.1 oz (66.2 kg)  SpO2 99%  Physical Exam  Nursing note and vitals reviewed. Constitutional:       Patient in extremis, flaccid, unresponsive, but with occasional spontaneous motion of her extremities. Patient's frothing at the mouth isn't visible.   HENT:       Eyes are mid point, deviated to the left, with no ocular tracking. Patient is edentulous. Patient's frothing at the mouth.   Eyes: Right eye exhibits no discharge. Left eye exhibits no discharge. Right pupil is round. Left pupil  is round.  Neck: No rigidity. No edema, no erythema and normal range of motion present.  Cardiovascular: Regular rhythm.  Tachycardia present.   Pulmonary/Chest: Accessory muscle usage present. Tachypnea noted. She is in respiratory distress. She has decreased breath sounds in the right upper field, the right middle field, the right lower field, the left upper field, the left middle field and the left lower field.  Abdominal: Soft. Normal appearance.  Musculoskeletal:       No evidence of trauma, no gross deformities  Neurological:       Patient is initially flaccid, unresponsive, but with occasional extremity motion, and persistent colonic activity of her lips, and feet. Eyes are deviated to the left, there is facial droop.  Skin: She is diaphoretic.    ED Course  Procedures (including critical care time)  Labs Reviewed  CBC WITH DIFFERENTIAL - Abnormal; Notable for the following:    RDW 17.4 (*)     All other components within normal limits  POCT I-STAT TROPONIN I  CULTURE, BLOOD (ROUTINE X 2)  CULTURE, BLOOD (ROUTINE X 2)  COMPREHENSIVE METABOLIC PANEL  LACTIC ACID, PLASMA  PROCALCITONIN  URINALYSIS, ROUTINE W REFLEX MICROSCOPIC  URINE CULTURE  PRO B NATRIURETIC PEPTIDE  PROTIME-INR  BLOOD GAS, ARTERIAL  TROPONIN I  APTT   Dg Chest Portable 1 View  06/08/2012  *RADIOLOGY REPORT*  Clinical Data: Endotracheal tube position.  PORTABLE CHEST - 1 VIEW  Comparison: 12/19/2011  Findings: Interval placement endotracheal tube with tip in the right mainstem bronchus.  Consider retraction of the tube about 3 cm.  Shallow inspiration.  Slightly increased density in the left lung may represent atelectasis.  Mild cardiac enlargement with normal pulmonary vascularity.  No blunting of costophrenic angles. No pneumothorax.  Degenerative changes  in the shoulders.  IMPRESSION: Endotracheal tube tip is in the right mainstem bronchus.  Results discussed by telephone at 2340 hours on 06/08/2012 with  Dr. Jeraldine Loots.   Original Report Authenticated By: Burman Nieves, M.D.    I interpreted the x-ray, subsequently repositioned the ET tube.    No diagnosis found.  Immediately after the patient's arrival, she received IV access, and several boluses of benzodiazepine.  The patient's seizure activity continued, her lack of responsiveness was persistent, and she required intubation.  Intubation was successfully completed, with adjustment of the ET tube following the x-ray.  Oxygen saturation was 100% with mechanical ventilation.   Soon after the initial interventions, a quick chart review was performed.  The patient has multiple medical problems, including CHF, CAD, hypertension.  No noted history of seizures.   Date: 06/09/2012 -EMS  Rate: 104  Rhythm: sinus tachycardia  QRS Axis: left  Intervals: normal  ST/T Wave abnormalities: nonspecific ST/T changes  Conduction Disutrbances:left bundle branch block  Narrative Interpretation:   Old EKG Reviewed: none available ' ABNORMAL   Update: The patient has a decreased heart rate, 110, blood pressure improving 160/100. I spoke with our critical care team.  Update: I updated the patient's family about the patient's condition. The patient's family states that approximately 6 hours prior to arrival the patient was slightly off, but without focal changes.  Approximately 4 hours prior to resuscitation the patient went to bed, abnormally early.  One hour later the patient's husband was unable to awaken her.  Just prior to calling EMS the patient was also unable to be awakened, prompting the call.  The patient's husband states that this event was marked by the patient's unresponsiveness, seizure-like activity.  Patient's family affirms full-code status   Update: Vital signs consistent, critical care assuming the patient's case.   INTUBATION Performed by: Gerhard Munch  Required items: required blood products, implants, devices, and  special equipment available Patient identity confirmed: provided demographic data and hospital-assigned identification number Time out: Immediately prior to procedure a "time out" was called to verify the correct patient, procedure, equipment, support staff and site/side marked as required.  Indications: respiratory distress, AMS  Intubation method: Glidescope Laryngoscopy   Preoxygenation: BVM  Sedatives: 20Etomidate Paralytic: 100Succinylcholine  Tube Size: 7.5 cuffed  Post-procedure assessment: chest rise and ETCO2 monitor Breath sounds: equal and absent over the epigastrium Tube secured with: ETT holder Chest x-ray interpreted by radiologist and me.  Chest x-ray findings: initially the tube was right main stem, but after after adjusting, the endotracheal tube was placed in appropriate position.  Patient tolerated the procedure well with no immediate complications.       MDM  This patient presents in extremis after being found unresponsive by a family member.  On exam initially, the patient is tachypneic, tachycardic, hypertensive, unresponsive.  With these abnormalities, and the failure to improve with Ativan, and the need to expeditiously evaluate the patient, she was intubated.  Following admission the patient was sedated with propofol.  Hemodynamically she improved.  Notably, the patient's initial evaluation demonstrated a new left bundle branch block, mild lactic acidosis.  Without a seizure history, there was suspicion of CVA versus infectious versus cardiac etiology.  The patient required admission to the ICU for further evaluation and management.  CRITICAL CARE Performed by: Gerhard Munch   Total critical care time: 35  Critical care time was exclusive of separately billable procedures and treating other patients.  Critical care was necessary to treat or prevent  imminent or life-threatening deterioration.  Critical care was time spent personally by me on the  following activities: development of treatment plan with patient and/or surrogate as well as nursing, discussions with consultants, evaluation of patient's response to treatment, examination of patient, obtaining history from patient or surrogate, ordering and performing treatments and interventions, ordering and review of laboratory studies, ordering and review of radiographic studies, pulse oximetry and re-evaluation of patient's condition.         Gerhard Munch, MD 06/09/12 [phone]

## 2012-06-09 NOTE — Progress Notes (Signed)
INITIAL NUTRITION ASSESSMENT  DOCUMENTATION CODES Per approved criteria  -Obesity Unspecified   INTERVENTION: Initiate Promote @ 10 ml/hr via OGT. 30 ml Prostat five times per day.  At goal rate, tube feeding regimen will provide 740 kcal, 90 grams of protein, and 201 ml of H2O.   TF regimen and propofol at current rate providing 1109 total kcal/day (24 kcal/kg of ideal body weight)  MVI daily  NUTRITION DIAGNOSIS: Inadequate oral intake related to inability to eat as evidenced by NPO status.  Goal: Enteral nutrition to provide 60-70% of estimated calorie needs (22-25 kcals/kg ideal body weight) and >/= 90% of estimated protein needs, based on ASPEN guidelines for permissive underfeeding in critically ill obese individuals.  Monitor:  TF initiation, tolerance, weight, labs  Reason for Assessment: Consult received to initiate and manage enteral nutrition support.  79 y.o. female  Admitting Dx: Found Down  ASSESSMENT: Patient is currently intubated on ventilator support for airway protection after being found down at home frothing from the mouth.  Pt discussed during ICU rounds and with RN. Pt with hx of CHF with possible exacerbation.  Pt's husband and son are at bedside and provide hx. Per family pt was sick last year about this time and eventually went to a SNF. She was doing poorly so they took her home where she began to do better. Per husband, wife had lost weight but was eating better at home than she had when she was hospitalized/at SNF.  MV: 7.1 Temp:Temp (24hrs), Avg:97.6 F (36.4 C), Min:96.6 F (35.9 C), Max:98.6 F (37 C)  Propofol: 14 ml/hr providing 369 kcal/day from lipid   Height: Ht Readings from Last 1 Encounters:  06/09/12 5' (1.524 m)    Weight: Wt Readings from Last 1 Encounters:  06/09/12 172 lb 13.5 oz (78.4 kg)    Ideal Body Weight: 45.4 kg  % Ideal Body Weight: 173%  Wt Readings from Last 10 Encounters:  06/09/12 172 lb 13.5 oz (78.4 kg)   03/07/12 146 lb (66.225 kg)  07/24/11 200 lb 13.4 oz (91.1 kg)  06/26/11 170 lb (77.111 kg)  06/17/11 204 lb (92.534 kg)  06/12/11 204 lb (92.534 kg)  05/06/11 204 lb (92.534 kg)  04/19/11 199 lb (90.266 kg)  05/08/10 217 lb (98.431 kg)  01/07/10 219 lb (99.338 kg)    Usual Body Weight: 200 lb   % Usual Body Weight: 86%  BMI:  Body mass index is 33.76 kg/(m^2). Obesity Class I   Estimated Nutritional Needs: Kcal: 1341 Protein: >/= 90 grams  Fluid: >1.4 L/day  Skin:  No issues noted  Diet Order: NPO  EDUCATION NEEDS: -No education needs identified at this time   Intake/Output Summary (Last 24 hours) at 06/09/12 1115 Last data filed at 06/09/12 0925  Gross per 24 hour  Intake 368.61 ml  Output   1050 ml  Net -681.39 ml    Last BM: PTA   Labs:   Lab 06/09/12 0028 06/08/12 2309  NA -- 133*  K -- 3.4*  CL -- 91*  CO2 -- 26  BUN -- 13  CREATININE -- 1.06  CALCIUM -- 9.5  MG 1.8 --  PHOS -- --  GLUCOSE -- 398*    CBG (last 3)   Basename 06/09/12 0407 06/09/12 0030  GLUCAP 407* 215*    Scheduled Meds:   . antiseptic oral rinse  1 application Mouth Rinse QID  . aspirin  325 mg Oral Daily  . bimatoprost  1 drop Both Eyes  BID  . chlorhexidine  15 mL Mouth/Throat BID  . furosemide  40 mg Intravenous Q12H  . heparin subcutaneous  5,000 Units Subcutaneous Q8H  . levetiracetam  1,000 mg Intravenous Q12H  . pantoprazole (PROTONIX) IV  40 mg Intravenous Q24H    Continuous Infusions:   . feeding supplement (OSMOLITE 1.2 CAL)    . insulin (NOVOLIN-R) infusion 5.7 Units/hr (06/09/12 0900)  . propofol 35 mcg/kg/min (06/09/12 4540)    Past Medical History  Diagnosis Date  . Diabetes mellitus   . GERD (gastroesophageal reflux disease)   . Hypertension   . Diverticulitis   . Gout   . Essential and other specified forms of tremor   . Dementia   . CAD (coronary artery disease)   . CHF (congestive heart failure)     Diastolic dysfunction    Past  Surgical History  Procedure Date  . Dilation and curettage of uterus   . Tumor removal     Kendell Bane RD, LDN, CNSC [phone] Pager [phone] After Hours Pager

## 2012-06-09 NOTE — ED Notes (Signed)
ETT pulled back to 23 cm at lip per Dr.Lockwood; intensivists at bedside

## 2012-06-09 NOTE — Progress Notes (Signed)
UR completed 

## 2012-06-09 NOTE — ED Notes (Signed)
accucheck 215 mg/dl - insulin drip held at this time - MD aware of same

## 2012-06-09 NOTE — Procedures (Signed)
Name:  Glenda Gonzalez MRN:  [id number] DOB:  [birth year]  PROCEDURE NOTE  Procedure:  Central venous catheter placement.  Indications:  Need for intravenous access and hemodynamic monitoring.  Consent:  Consent was implied due to the emergency nature of the procedure.  Anesthesia:  A total of 10 mL of 1% Lidocaine was used for local infiltration anesthesia.  Procedure summary:  Appropriate equipment was assembled.  The patient was identified as Glenda Gonzalez and safety timeout was performed. The patient was placed in Trendelenburg position.  Sterile technique was used. The patient's left anterior chest wall was prepped using chlorhexidine / alcohol scrub and the field was draped in usual sterile fashion with full body drape. After the adequate anesthesia was achieved, the left subclavian vein was cannulated with the introducer needle without difficulty. A guide wire was advanced through the introducer needle, which was then withdrawn. A small skin incision was made at the point of wire entry, the dilator was inserted over the guide wire and appropriate dilation was obtained. The dilator was removed and triple-lumen catheter was advanced over the guide wire, which was then removed.  All ports were aspirated and flushed with normal saline without difficulty. The catheter was secured into place. Antibiotic patch was placed and sterile dressing was applied. Post-procedure chest x-ray was ordered.  Complications:  No immediate complications were noted.  Hemodynamic parameters and oxygenation remained stable throughout the procedure.  Estimated blood loss:  Less then 5 mL.  Orlean Bradford, M.D. Pulmonary and Critical Care Medicine [facility] Cell: [phone]  06/09/2012, 2:11 AM

## 2012-06-09 NOTE — Progress Notes (Signed)
  Echocardiogram 2D Echocardiogram has been performed.  Cathie Beams 06/09/2012, 11:33 AM

## 2012-06-09 NOTE — Progress Notes (Signed)
PULMONARY  / CRITICAL CARE MEDICINE  Name: JERELINE TICER MRN: [id number] DOB: 03/28/1934    LOS: 1  REFERRING MD:  EDP  CHIEF COMPLAINT:   Found down  BRIEF PATIENT DESCRIPTION: 79 yo with DM, CAD, CHF found down, frothing from her mouth, with possible seizure activity. In ED: hypertensive, intubated for airway protection.  LINES / TUBES: OETT 1/14 >>> OGT 1/14 >>> Foley 1/14 >>>  CULTURES: 1/14  Blood >>>  ANTIBIOTICS:  SIGNIFICANT EVENTS:  1/14 >>> Found down, frothing from her mouth, with possible seizure activity, hypertensive, intubated for airway protection. 1/14 ct head - neg acute process  LEVEL OF CARE:  ICU  PRIMARY SERVICE:  PCCM  CONSULTANTS:   CODE STATUS:  Full  DIET:  NPO  DVT Px:  Heparin  GI Px:  Protonix  INTERVAL HISTORY:  CT head negative  VITAL SIGNS: Temp:  [96.6 F (35.9 C)-98.6 F (37 C)] 98.1 F (36.7 C) (01/15 0900) Pulse Rate:  [81-121] 98  (01/15 0900) Resp:  [14-20] 20  (01/15 0900) BP: (111-191)/(65-151) 145/88 mmHg (01/15 0900) SpO2:  [97 %-100 %] 100 % (01/15 0900) FiO2 (%):  [50 %-100 %] 50 % (01/15 0846) Weight:  [78.4 kg (172 lb 13.5 oz)] 78.4 kg (172 lb 13.5 oz) (01/15 0300) HEMODYNAMICS: CVP:  [1 mmHg-7 mmHg] 6 mmHg VENTILATOR SETTINGS: Vent Mode:  [-] PRVC FiO2 (%):  [50 %-100 %] 50 % Set Rate:  [16 bmp] 16 bmp Vt Set:  [360 mL-500 mL] 360 mL PEEP:  [5 cmH20] 5 cmH20 Plateau Pressure:  [13 cmH20] 13 cmH20 INTAKE / OUTPUT: Intake/Output      01/14 0701 - 01/15 0700 01/15 0701 - 01/16 0700   I.V. (mL/kg) 63.7 (0.8) 50.9 (0.6)   IV Piggyback 154 100   Total Intake(mL/kg) 217.7 (2.8) 150.9 (1.9)   Urine (mL/kg/hr) 675 (0.4) 375   Total Output 675 375   Net -457.3 -224.1          PHYSICAL EXAMINATION: General:  Sedated rass -2 Neuro:  Encephalopathic, nonfocal, cough / gag off propofol HEENT:  PERRL, OETT / OGT Cardiovascular:  RRR, no m/r/g Lungs:  Bilateral diminished Abdomen:  Obese,  soft Musculoskeletal:  Bilateral pedal edema Skin:  Intact  LABS:  Lab 06/09/12 0530 06/09/12 0407 06/09/12 0028 06/08/12 2347 06/08/12 2309  HGB -- -- -- -- 12.2  WBC -- -- -- -- 9.1  PLT -- -- -- -- 251  NA -- -- -- -- 133*  K -- -- -- -- 3.4*  CL -- -- -- -- 91*  CO2 -- -- -- -- 26  GLUCOSE -- -- -- -- 398*  BUN -- -- -- -- 13  CREATININE -- -- -- -- 1.06  CALCIUM -- -- -- -- 9.5  MG -- -- 1.8 -- --  PHOS -- -- -- -- --  AST -- -- -- -- 60*  ALT -- -- -- -- 25  ALKPHOS -- -- -- -- 220*  BILITOT -- -- -- -- 0.3  PROT -- -- -- -- 8.5*  ALBUMIN -- -- -- -- 3.3*  APTT -- -- -- -- 37  INR -- -- -- -- 1.10  LATICACIDVEN -- -- -- 2.6* --  TROPONINI <0.30 -- <0.30 -- --  PROCALCITON -- -- -- -- <0.10  PROBNP -- -- -- -- 234.3  O2SATVEN -- -- -- -- --  PHART -- 7.342* -- -- --  PCO2ART -- 49.1* -- -- --  PO2ART -- 398.0* -- -- --  Lab 06/09/12 0407 06/09/12 0030  GLUCAP 407* 215*    IMAGING: 1/15  PCXR >>> ETT  WNL corrected position, rotation, no defined infiltrates 1/14  Head CT >>> nad  ECG: 1/14  12-lead ECG >>>  Sinus tachycardia, 1 degree AV block, new LBBB (not present in October 2013).  DIAGNOSES: Active Problems:  DM  HYPERTENSION  CONGESTIVE HEART FAILURE UNSPECIFIED  Acute encephalopathy  Acute respiratory failure  Status epilepticus  Hyperglycemia  ASSESSMENT / PLAN:  PULMONARY  A:  Acute respiratory failure in setting of status epilepticus and possible acute pulmonary edema.  Possible aspiration, but CXR is underwhelming. P:   abg reviewed, continue current MV on vent Consider SBT attempts with low dose sedation, unlikely to extubate today pcxr repeat in am  pao2 adequate, reduce O2  CARDIOVASCULAR  A: History of CHF.  Possible CHF exacerbation with resultant pulmonary edema.  Possible hypertensive emergency.  New LBBB since last ECG P:  Continue ASA Labetalol PRN Trend Troponin - flat and negative 2D echo assessment Keep k  greater 4, mag greater 2, repeat lytes May need cards evaluation MAP goals, 25% reduction from highest MAP on admission  RENAL  A:  Normal renal function.  Hypokalemia. P:   Recheck  Lytes now and in am kvo  GASTROINTESTINAL  A:  No active issues. P:   Start TF  ppi  HEMATOLOGIC  A:  DVT prevention P:  Trend CBC scd Sub  q heparin   INFECTIOUS  A:  No evidence of acute infection. P:   Blood cultures Follow fever curve No pcxr infiltrates / aspiration at this stage  ENDOCRINE   A:  DM.  Hyperglycemia.   P:   ICU Glycemic Control Protocol, Phase 1  NEUROLOGIC  A:  Acute encephalopathy.  Possible status epilepticus.  Dementia. P:   Goal RASS 0 to -1 Propofol gtt Keppra Hold Aricept, Lyrica, Ultram, Ambien Drug screen awaited EEG in AM Neuro evaluation Consider MRI  CLINICAL SUMMARY: 79 yo with DM, CAD, CHF found down, frothing from her mouth, with possible seizure activity. In ED: hypertensive, intubated for airway protection. Start TF , neuro consult, SBT, unable to extubate today, echo important with LBBB  I have personally obtained a history, examined the patient, evaluated laboratory and imaging results, formulated the assessment and plan and placed orders.  CRITICAL CARE:  The patient is critically ill with multiple organ systems failure and requires high complexity decision making for assessment and support, frequent evaluation and titration of therapies, application of advanced monitoring technologies and extensive interpretation of multiple databases. Critical Care Time devoted to patient care services described in this note is 40 minutes.   Mcarthur Rossetti. Tyson Alias, MD, FACP Pgr: [phone] [facility]

## 2012-06-09 NOTE — Consult Note (Signed)
NEURO HOSPITALIST CONSULT NOTE    Reason for Consult: Encephalopathy  HPI:                                                                                                                                          Glenda Gonzalez is an 79 y.o. female who was found down at home per chart.  Patient was brought to ED where she was hypertensive and intubated due to lack of airway protection. Initial head CT shows no acute infarct or intracranial abnormality and currently on Keppra 1 gram BID.  EEG pending.  Neurology asked to see patient for decreased AMS.   Past Medical History  Diagnosis Date  . Diabetes mellitus   . GERD (gastroesophageal reflux disease)   . Hypertension   . Diverticulitis   . Gout   . Essential and other specified forms of tremor   . Dementia   . CAD (coronary artery disease)   . CHF (congestive heart failure)     Diastolic dysfunction    Past Surgical History  Procedure Date  . Dilation and curettage of uterus   . Tumor removal     Family History  Problem Relation Age of Onset  . Colon cancer Neg Hx   . Heart disease Father   . Heart disease Brother      Social History:  reports that she has never smoked. She has never used smokeless tobacco. She reports that she does not drink alcohol or use illicit drugs.  Allergies  Allergen Reactions  . Codeine Other (See Comments)    hallucination  . Fentanyl Other (See Comments)    Abnormal behavior per husband  . Morphine Itching  . Penicillins Itching    MEDICATIONS:                                                                                                                     Prior to Admission:  Prescriptions prior to admission  Medication Sig Dispense Refill  . acetaminophen (TYLENOL) 325 MG tablet Take 325-650 mg by mouth every 6 (six) hours as needed. Headache or pain      . bimatoprost (LUMIGAN) 0.03 % ophthalmic solution Place 1 drop into both eyes 2 (two) times daily.        Marland Kitchen  donepezil (ARICEPT) 10 MG tablet Take 10 mg by mouth at bedtime.       Marland Kitchen esomeprazole (NEXIUM) 40 MG capsule Take 40 mg by mouth 2 (two) times daily before a meal.      . furosemide (LASIX) 20 MG tablet Take 20 mg by mouth daily.      Marland Kitchen loperamide (IMODIUM) 2 MG capsule Take 2 mg by mouth daily as needed. For diarrhea      . pregabalin (LYRICA) 50 MG capsule Take 50 mg by mouth 3 (three) times daily.        Bertram Gala Glycol-Propyl Glycol (SYSTANE ULTRA OP) Place 1 drop into both eyes daily as needed. For dry eyes       Scheduled:   . antiseptic oral rinse  1 application Mouth Rinse QID  . aspirin  325 mg Oral Daily  . bimatoprost  1 drop Both Eyes BID  . chlorhexidine  15 mL Mouth/Throat BID  . furosemide  40 mg Intravenous Q12H  . heparin subcutaneous  5,000 Units Subcutaneous Q8H  . levetiracetam  1,000 mg Intravenous Q12H  . pantoprazole (PROTONIX) IV  40 mg Intravenous Q24H  . potassium chloride  10 mEq Intravenous Q1 Hr x 4     ROS:                                                                                                                                       History obtained from unobtainable from patient due to mental status    Blood pressure 145/88, pulse 98, temperature 98.1 F (36.7 C), temperature source Core (Comment), resp. rate 20, height 5' (1.524 m), weight 78.4 kg (172 lb 13.5 oz), SpO2 100.00%.   Neurologic Examination:                                                                                                      Mental Status: Patient does not respond to verbal stimuli.  Does  respond to deep sternal rub localizing with right hand and grimacing.  Does not follow commands.  No verbalizations noted--intubated. Breathing over ventilator Cranial Nerves: II: patient does not respond confrontation bilaterally, pupils right 2 mm, left 2 mm,and reactive bilaterally III,IV,VI: doll's response present bilaterally. Eyes skewed at rest and roving. V,VII:  corneal reflex present bilaterally  VIII: patient does not respond to verbal stimuli IX,X: gag reflex present, XI: trapezius strength 3/5 XII: tongue strength unable to test Motor: Extremities moving spontaneously bilaterally, right arm able to  lift antigravity reaching for ET tube, left arm shows less movement and does not raise above bed.  Both legs are flexed at the knee and drawn to chest with painful stimulation.    Sensory: Winces to pain in all 4 extremities, UE greater than LE bilaterally.  Deep Tendon Reflexes:  2+ throughout UE and bilateral KJ. No AJ. Plantars: equivocal bilaterally Cerebellar: Unable to perform     Lab Results  Component Value Date/Time   CHOL 242* 10/12/2010  6:10 AM    Results for orders placed during the hospital encounter of 06/08/12 (from the past 48 hour(s))  CBC WITH DIFFERENTIAL     Status: Abnormal   Collection Time   06/08/12 11:09 PM      Component Value Range Comment   WBC 9.1  4.0 - 10.5 K/uL    RBC 4.48  3.87 - 5.11 MIL/uL    Hemoglobin 12.2  12.0 - 15.0 g/dL    HCT 40.9  81.1 - 91.4 %    MCV 82.1  78.0 - 100.0 fL    MCH 27.2  26.0 - 34.0 pg    MCHC 33.2  30.0 - 36.0 g/dL    RDW 78.2 (*) 95.6 - 15.5 %    Platelets 251  150 - 400 K/uL    Neutrophils Relative 61  43 - 77 %    Neutro Abs 5.5  1.7 - 7.7 K/uL    Lymphocytes Relative 27  12 - 46 %    Lymphs Abs 2.5  0.7 - 4.0 K/uL    Monocytes Relative 9  3 - 12 %    Monocytes Absolute 0.8  0.1 - 1.0 K/uL    Eosinophils Relative 2  0 - 5 %    Eosinophils Absolute 0.2  0.0 - 0.7 K/uL    Basophils Relative 1  0 - 1 %    Basophils Absolute 0.1  0.0 - 0.1 K/uL   COMPREHENSIVE METABOLIC PANEL     Status: Abnormal   Collection Time   06/08/12 11:09 PM      Component Value Range Comment   Sodium 133 (*) 135 - 145 mEq/L    Potassium 3.4 (*) 3.5 - 5.1 mEq/L HEMOLYSIS AT THIS LEVEL MAY AFFECT RESULT   Chloride 91 (*) 96 - 112 mEq/L    CO2 26  19 - 32 mEq/L    Glucose, Bld 398 (*) 70 - 99  mg/dL    BUN 13  6 - 23 mg/dL    Creatinine, Ser 2.13  0.50 - 1.10 mg/dL    Calcium 9.5  8.4 - 08.6 mg/dL    Total Protein 8.5 (*) 6.0 - 8.3 g/dL    Albumin 3.3 (*) 3.5 - 5.2 g/dL    AST 60 (*) 0 - 37 U/L    ALT 25  0 - 35 U/L    Alkaline Phosphatase 220 (*) 39 - 117 U/L    Total Bilirubin 0.3  0.3 - 1.2 mg/dL    GFR calc non Af Amer 50 (*) >90 mL/min    GFR calc Af Amer 58 (*) >90 mL/min   PROCALCITONIN     Status: Normal   Collection Time   06/08/12 11:09 PM      Component Value Range Comment   Procalcitonin <0.10     PRO B NATRIURETIC PEPTIDE     Status: Normal   Collection Time   06/08/12 11:09 PM      Component Value Range Comment   Pro  B Natriuretic peptide (BNP) 234.3  0 - 450 pg/mL   PROTIME-INR     Status: Normal   Collection Time   06/08/12 11:09 PM      Component Value Range Comment   Prothrombin Time 14.1  11.6 - 15.2 seconds    INR 1.10  0.00 - 1.49   APTT     Status: Normal   Collection Time   06/08/12 11:09 PM      Component Value Range Comment   aPTT 37  24 - 37 seconds   URINALYSIS, ROUTINE W REFLEX MICROSCOPIC     Status: Abnormal   Collection Time   06/08/12 11:37 PM      Component Value Range Comment   Color, Urine YELLOW  YELLOW    APPearance CLEAR  CLEAR    Specific Gravity, Urine 1.022  1.005 - 1.030    pH 7.0  5.0 - 8.0    Glucose, UA >1000 (*) NEGATIVE mg/dL    Hgb urine dipstick MODERATE (*) NEGATIVE    Bilirubin Urine NEGATIVE  NEGATIVE    Ketones, ur NEGATIVE  NEGATIVE mg/dL    Protein, ur >147 (*) NEGATIVE mg/dL    Urobilinogen, UA 0.2  0.0 - 1.0 mg/dL    Nitrite NEGATIVE  NEGATIVE    Leukocytes, UA NEGATIVE  NEGATIVE   URINE MICROSCOPIC-ADD ON     Status: Normal   Collection Time   06/08/12 11:37 PM      Component Value Range Comment   Squamous Epithelial / LPF RARE  RARE    RBC / HPF 3-6  <3 RBC/hpf    Bacteria, UA RARE  RARE   POCT I-STAT TROPONIN I     Status: Normal   Collection Time   06/08/12 11:45 PM      Component Value Range  Comment   Troponin i, poc 0.02  0.00 - 0.08 ng/mL    Comment 3            LACTIC ACID, PLASMA     Status: Abnormal   Collection Time   06/08/12 11:47 PM      Component Value Range Comment   Lactic Acid, Venous 2.6 (*) 0.5 - 2.2 mmol/L   TROPONIN I     Status: Normal   Collection Time   06/09/12 12:28 AM      Component Value Range Comment   Troponin I <0.30  <0.30 ng/mL   MAGNESIUM     Status: Normal   Collection Time   06/09/12 12:28 AM      Component Value Range Comment   Magnesium 1.8  1.5 - 2.5 mg/dL   GLUCOSE, CAPILLARY     Status: Abnormal   Collection Time   06/09/12 12:30 AM      Component Value Range Comment   Glucose-Capillary 215 (*) 70 - 99 mg/dL   MRSA PCR SCREENING     Status: Normal   Collection Time   06/09/12  2:36 AM      Component Value Range Comment   MRSA by PCR NEGATIVE  NEGATIVE   BLOOD GAS, ARTERIAL     Status: Abnormal   Collection Time   06/09/12  4:07 AM      Component Value Range Comment   FIO2 1.00      Delivery systems VENTILATOR      Mode PRESSURE REGULATED VOLUME CONTROL      VT 360      Rate 16      Peep/cpap 5.0  pH, Arterial 7.342 (*) 7.350 - 7.450    pCO2 arterial 49.1 (*) 35.0 - 45.0 mmHg    pO2, Arterial 398.0 (*) 80.0 - 100.0 mmHg    Bicarbonate 25.9 (*) 20.0 - 24.0 mEq/L    TCO2 27.4  0 - 100 mmol/L    Acid-Base Excess 0.8  0.0 - 2.0 mmol/L    O2 Saturation 99.5      Patient temperature 98.6      Collection site BRACHIAL ARTERY      Drawn by [phone]      Sample type ARTERIAL      Allens test (pass/fail) PASS  PASS   GLUCOSE, CAPILLARY     Status: Abnormal   Collection Time   06/09/12  4:07 AM      Component Value Range Comment   Glucose-Capillary 407 (*) 70 - 99 mg/dL    Comment 1 Notify RN     TROPONIN I     Status: Normal   Collection Time   06/09/12  5:30 AM      Component Value Range Comment   Troponin I <0.30  <0.30 ng/mL     Ct Head Wo Contrast  06/09/2012  *RADIOLOGY REPORT*  Clinical Data: Unresponsive.  CT HEAD  WITHOUT CONTRAST  Technique:  Contiguous axial images were obtained from the base of the skull through the vertex without contrast.  Comparison: 07/20/2011  Findings: Patchy opacification of bilateral ethmoid air cells. Atherosclerotic and physiologic intracranial calcifications. Diffuse parenchymal atrophy. Patchy areas of hypoattenuation in deep and periventricular white matter bilaterally. Negative for acute intracranial hemorrhage, mass lesion, acute infarction, midline shift, or mass-effect. Acute infarct may be inapparent on noncontrast CT. Ventricles and sulci symmetric. Bone windows demonstrate no focal lesion.  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  IMPRESSION:  1. Negative for bleed or other acute intracranial process.  2. Atrophy and nonspecific white matter changes   Original Report Authenticated By: D. Andria Rhein, MD    Dg Chest Port 1 View  06/09/2012  *RADIOLOGY REPORT*  Clinical Data: Central line placement  PORTABLE CHEST - 1 VIEW  Comparison:   the previous day's study  Findings: Endotracheal tube has been repositioned, tip 3.3 cm above carina.  Left subclavian central line has been placed to the distal SVC.  No pneumothorax.  Nasogastric tube has been passed at least as far as the stomach, tip not seen.  Heart size upper limits normal as before.  Mildly tortuous ectatic thoracic aorta. Improved left lung aeration with some decrease in the perihilar atelectasis or infiltrates.  No effusion.  IMPRESSION:  1.  Support hardware projecting in expected location as above. 2.  No pneumothorax. 3.  Improved left lung aeration.   Original Report Authenticated By: D. Andria Rhein, MD    Dg Chest Portable 1 View  06/08/2012  *RADIOLOGY REPORT*  Clinical Data: Endotracheal tube position.  PORTABLE CHEST - 1 VIEW  Comparison: 12/19/2011  Findings: Interval placement endotracheal tube with tip in the right mainstem bronchus.  Consider retraction of the tube about 3 cm.   Shallow inspiration.  Slightly increased density in the left lung may represent atelectasis.  Mild cardiac enlargement with normal pulmonary vascularity.  No blunting of costophrenic angles. No pneumothorax.  Degenerative changes in the shoulders.  IMPRESSION: Endotracheal tube tip is in the right mainstem bronchus.  Results discussed by telephone at 2340 hours on 06/08/2012 with Dr. Jeraldine Loots.   Original Report Authenticated By: Burman Nieves, M.D.  Assessment/Plan:  [name] female found down with possible seizure activity.  On exam she shows greater spontaneous moment on the right arm than left but moving all extremities spontaneously and localizes to pain with right arm.  Etiology of AMS unclear, likely secondary to hypertensive encephalopathy or post ictal but given exam of decreased movement on the left cannot rule out CVA.   Recommend: 1) Agree with EEG 2) Continue Keppra 3) MRI brain without contrast.     Felicie Morn PA-C [facility] [phone]  06/09/2012, 10:14 AM

## 2012-06-09 NOTE — ED Notes (Signed)
Triple lumen placed to left subclavian per critical care team; PCXR done for verification of proper line placement

## 2012-06-09 NOTE — ED Notes (Signed)
16 french OG tube inserted without difficulty; placement verified per protocol; auscultated air when instilled into stomach; OG tube secured to ETT

## 2012-06-10 ENCOUNTER — Inpatient Hospital Stay (HOSPITAL_COMMUNITY): Payer: Medicare Other

## 2012-06-10 LAB — GLUCOSE, CAPILLARY
Glucose-Capillary: 204 mg/dL — ABNORMAL HIGH (ref 70–99)
Glucose-Capillary: 192 mg/dL — ABNORMAL HIGH (ref 70–99)
Glucose-Capillary: 162 mg/dL — ABNORMAL HIGH (ref 70–99)
Glucose-Capillary: 134 mg/dL — ABNORMAL HIGH (ref 70–99)
Glucose-Capillary: 180 mg/dL — ABNORMAL HIGH (ref 70–99)
Glucose-Capillary: 210 mg/dL — ABNORMAL HIGH (ref 70–99)
Glucose-Capillary: 227 mg/dL — ABNORMAL HIGH (ref 70–99)
Glucose-Capillary: 198 mg/dL — ABNORMAL HIGH (ref 70–99)

## 2012-06-10 LAB — URINE CULTURE
Colony Count: NO GROWTH
Culture: NO GROWTH

## 2012-06-10 LAB — CBC WITH DIFFERENTIAL/PLATELET
Eosinophils Absolute: 0.2 10*3/uL (ref 0.0–0.7)
Lymphs Abs: 2 10*3/uL (ref 0.7–4.0)
MCH: 26.6 pg (ref 26.0–34.0)
Neutrophils Relative %: 63 % (ref 43–77)
Platelets: 206 10*3/uL (ref 150–400)
RBC: 3.68 MIL/uL — ABNORMAL LOW (ref 3.87–5.11)
WBC: 7.3 10*3/uL (ref 4.0–10.5)

## 2012-06-10 LAB — COMPREHENSIVE METABOLIC PANEL
AST: 19 U/L (ref 0–37)
BUN: 29 mg/dL — ABNORMAL HIGH (ref 6–23)
CO2: 26 mEq/L (ref 19–32)
Chloride: 105 mEq/L (ref 96–112)
Creatinine, Ser: 1.15 mg/dL — ABNORMAL HIGH (ref 0.50–1.10)
GFR calc Af Amer: 52 mL/min — ABNORMAL LOW (ref >90)
GFR calc non Af Amer: 45 mL/min — ABNORMAL LOW (ref >90)
Glucose, Bld: 196 mg/dL — ABNORMAL HIGH (ref 70–99)
Total Bilirubin: 0.2 mg/dL — ABNORMAL LOW (ref 0.3–1.2)

## 2012-06-10 MED ORDER — SODIUM CHLORIDE 0.9 % IV BOLUS (SEPSIS)
1000.0000 mL | Freq: Once | INTRAVENOUS | Status: AC
  Administered 2012-06-10: 1000 mL via INTRAVENOUS

## 2012-06-10 MED ORDER — DEXTROSE 10 % IV SOLN: INTRAVENOUS | Status: DC | PRN

## 2012-06-10 MED ORDER — SODIUM CHLORIDE 0.9 % IV SOLN
500.0000 mg | Freq: Two times a day (BID) | INTRAVENOUS | Status: DC
  Administered 2012-06-10 – 2012-06-14 (×8): 500 mg via INTRAVENOUS
  Filled 2012-06-10 (×10): qty 5

## 2012-06-10 MED ORDER — POTASSIUM CHLORIDE 20 MEQ/15ML (10%) PO LIQD
40.0000 meq | Freq: Once | ORAL | Status: AC
  Administered 2012-06-10: 40 meq
  Filled 2012-06-10: qty 30

## 2012-06-10 MED ORDER — INSULIN ASPART 100 UNIT/ML ~~LOC~~ SOLN
4.0000 [IU] | SUBCUTANEOUS | Status: DC
  Administered 2012-06-10 – 2012-06-12 (×8): 4 [IU] via SUBCUTANEOUS

## 2012-06-10 MED ORDER — INSULIN ASPART 100 UNIT/ML ~~LOC~~ SOLN
2.0000 [IU] | SUBCUTANEOUS | Status: DC
  Administered 2012-06-10: 6 [IU] via SUBCUTANEOUS
  Administered 2012-06-10: 2 [IU] via SUBCUTANEOUS
  Administered 2012-06-10: 4 [IU] via SUBCUTANEOUS
  Administered 2012-06-10: 2 [IU] via SUBCUTANEOUS
  Administered 2012-06-10: 6 [IU] via SUBCUTANEOUS
  Administered 2012-06-11: 2 [IU] via SUBCUTANEOUS
  Administered 2012-06-11: 4 [IU] via SUBCUTANEOUS
  Administered 2012-06-11 (×2): 2 [IU] via SUBCUTANEOUS

## 2012-06-10 MED ORDER — INSULIN GLARGINE 100 UNIT/ML ~~LOC~~ SOLN
10.0000 [IU] | SUBCUTANEOUS | Status: DC
  Administered 2012-06-10 – 2012-06-11 (×2): 10 [IU] via SUBCUTANEOUS

## 2012-06-10 MED ORDER — POTASSIUM CHLORIDE 20 MEQ/15ML (10%) PO LIQD
ORAL | Status: AC
  Filled 2012-06-10: qty 30

## 2012-06-10 NOTE — Progress Notes (Addendum)
Dr Herma Carson called regarding pts increased work of breathing and respiratory rate. Pt currently on 4L [name] sating 100%. No further orders at this time, will continue to monitor pt.   Glenda Gonzalez

## 2012-06-10 NOTE — Progress Notes (Signed)
PULMONARY  / CRITICAL CARE MEDICINE  Name: SONIKA LEVINS MRN: [id number] DOB: 04-16-1934    LOS: 2  REFERRING MD:  EDP  CHIEF COMPLAINT:   Found down  BRIEF PATIENT DESCRIPTION: 79 yo with DM, CAD, CHF found down, frothing from her mouth, with possible seizure activity. In ED: hypertensive, intubated for airway protection.  LINES / TUBES: OETT 1/14 >>> OGT 1/14 >>> Foley 1/14 >>>  CULTURES: 1/14  Blood >>>  ANTIBIOTICS:  SIGNIFICANT EVENTS:  1/14 >>> Found down, frothing from her mouth, with possible seizure activity, hypertensive, intubated for airway protection. 1/14 ct head - neg acute process 1/15 eeg - neg focus 1/15 MRI brain>>>Subcentimeter solitary acute infarct right parietal gray-white<BR>junction without associated hemorrhage. Advanced atrophy with<BR>extensive chronic microvascular ischemic change  LEVEL OF CARE:  ICU  PRIMARY SERVICE:  PCCM  CONSULTANTS:   CODE STATUS:  Full  DIET:  TF  DVT Px:  Heparin  GI Px:  Protonix  INTERVAL HISTORY:  EEG neg focus, awaken, not follows commands  VITAL SIGNS: Temp:  [98.4 F (36.9 C)-99.7 F (37.6 C)] 99.3 F (37.4 C) (01/16 0900) Pulse Rate:  [78-103] 88  (01/16 0900) Resp:  [4-21] 20  (01/16 0900) BP: (99-180)/(46-81) 170/70 mmHg (01/16 0900) SpO2:  [100 %] 100 % (01/16 0900) FiO2 (%):  [40 %] 40 % (01/16 0835) Weight:  [79.1 kg (174 lb 6.1 oz)] 79.1 kg (174 lb 6.1 oz) (01/16 0500) HEMODYNAMICS: CVP:  [2 mmHg-7 mmHg] 6 mmHg VENTILATOR SETTINGS: Vent Mode:  [-] CPAP;PSV FiO2 (%):  [40 %] 40 % Set Rate:  [16 bmp] 16 bmp Vt Set:  [360 mL] 360 mL PEEP:  [5 cmH20] 5 cmH20 Pressure Support:  [10 cmH20] 10 cmH20 Plateau Pressure:  [10 cmH20-14 cmH20] 10 cmH20 INTAKE / OUTPUT: Intake/Output      01/15 0701 - 01/16 0700 01/16 0701 - 01/17 0700   I.V. (mL/kg) 1157.7 (14.6) 123.1 (1.6)   Other  100   NG/GT 330 20   IV Piggyback 1310 100   Total Intake(mL/kg) 2797.7 (35.4) 343.1 (4.3)   Urine  (mL/kg/hr) 1745 (0.9) 265   Total Output 1745 265   Net +1052.7 +78.1          PHYSICAL EXAMINATION: General:  Sedated rass -1 Neuro:  Alert, opens eyes, all ext equal HEENT:  PERRL Cardiovascular:  RRR, no m/r/g Lungs:  Bilateral diminished Abdomen:  Obese, soft Musculoskeletal:  Bilateral pedal edema Skin:  Intact  LABS:  Lab 06/10/12 0450 06/09/12 1030 06/09/12 1027 06/09/12 0530 06/09/12 0407 06/09/12 0028 06/08/12 2347 06/08/12 2309  HGB 9.8* -- -- -- -- -- -- 12.2  WBC 7.3 -- -- -- -- -- -- 9.1  PLT 206 -- -- -- -- -- -- 251  NA 142 140 -- -- -- -- -- 133*  K 3.2* 3.1* -- -- -- -- -- --  CL 105 100 -- -- -- -- -- 91*  CO2 26 28 -- -- -- -- -- 26  GLUCOSE 196* 172* -- -- -- -- -- 398*  BUN 29* 15 -- -- -- -- -- 13  CREATININE 1.15* 1.07 -- -- -- -- -- 1.06  CALCIUM 8.2* 9.0 -- -- -- -- -- 9.5  MG -- 1.6 -- -- -- 1.8 -- --  PHOS -- 3.1 -- -- -- -- -- --  AST 19 -- -- -- -- -- -- 60*  ALT 12 -- -- -- -- -- -- 25  ALKPHOS 165* -- -- -- -- -- --  220*  BILITOT 0.2* -- -- -- -- -- -- 0.3  PROT 6.1 -- -- -- -- -- -- 8.5*  ALBUMIN 2.5* -- -- -- -- -- -- 3.3*  APTT -- -- -- -- -- -- -- 37  INR -- -- -- -- -- -- -- 1.10  LATICACIDVEN -- -- -- -- -- -- 2.6* --  TROPONINI -- -- <0.30 <0.30 -- <0.30 -- --  PROCALCITON -- -- -- -- -- -- -- <0.10  PROBNP -- -- -- -- -- -- -- 234.3  O2SATVEN -- -- -- -- -- -- -- --  PHART -- -- -- -- 7.342* -- -- --  PCO2ART -- -- -- -- 49.1* -- -- --  PO2ART -- -- -- -- 398.0* -- -- --    Lab 06/10/12 0836 06/10/12 0516 06/10/12 0406 06/10/12 0302 06/10/12 0158  GLUCAP 210* 180* 134* 125* 118*    IMAGING: 1/16  PCXR >>> ETT  WNL corrected position, rotation, no defined infiltrates 1/14  Head CT >>> nad See above MRI, EEG  ECG: 1/14  12-lead ECG >>>  Sinus tachycardia, 1 degree AV block, new LBBB (not present in October 2013).  DIAGNOSES: Active Problems:  DM  HYPERTENSION  CONGESTIVE HEART FAILURE UNSPECIFIED  Acute  encephalopathy  Acute respiratory failure  Status epilepticus  Hyperglycemia  ASSESSMENT / PLAN:  PULMONARY  A:  Acute respiratory failure in setting of status epilepticus and possible acute pulmonary edema.  Possible aspiration, but CXR is underwhelming. P:   Remains on min vent setting Goal sbt, cpap 5 ps 5, goal 1 hr, assess cough, resp strength, airway protection Still no infiltrates  CARDIOVASCULAR  A: History of CHF, not convinced of edema,  Possible hypertensive emergency.  New LBBB since last ECG P:  Continue ASA Labetalol PRN Trend Troponin - flat and negative 2D echo assessment - lvh, 55%, mild pa htn Keep k greater 4, mag greater 2, repeat lytes MAP goals, will shoot for further reduction in am , seems to be controlled at this point  RENAL  A:  Normal renal function.  Hypokalemia. Not convinced overload P:   Avoid lasix kvo ok for now, low threshold fluid k supp  GASTROINTESTINAL  A:  No active issues. P:   Tf , hold if weaning well ppi  HEMATOLOGIC  A:  DVT prevention P:  scd Sub  q heparin   INFECTIOUS  A:  No evidence of acute infection. P:   follow fever curve No role ABX  ENDOCRINE   A:  DM.  Hyperglycemia.   P:   ICU Glycemic Control Protocol, Phase 1  NEUROLOGIC  A:  Acute encephalopathy.  Possible status epilepticus.  Dementia. P:   Prop, WUA Mri reviewed, will discuss with neuro if small stroke relavent, EEG neg focus Anti epileptics per neuro Echo no clot Control BP   CLINICAL SUMMARY: 79 yo with DM, CAD, CHF found down, frothing from her mouth, with possible seizure activity. In ED: hypertensive, intubated for airway protection. Start TF , neuro consult appreciated, weaning, goal extubation  I have personally obtained a history, examined the patient, evaluated laboratory and imaging results, formulated the assessment and plan and placed orders.  CRITICAL CARE:  The patient is critically ill with multiple organ systems  failure and requires high complexity decision making for assessment and support, frequent evaluation and titration of therapies, application of advanced monitoring technologies and extensive interpretation of multiple databases. Critical Care Time devoted to patient care services described in this  note is 30 minutes.   Mcarthur Rossetti. Tyson Alias, MD, FACP Pgr: [phone] [facility]

## 2012-06-10 NOTE — Procedures (Signed)
EEG NUMBER:  REFERRING PHYSICIAN:  Lonia Farber, MD  HISTORY:  A 79 year old female on the ventilator with altered mental status and possible seizure activity.  MEDICATIONS:  Keppra, NovoLog, Lasix,  heparin, Diprivan.  CONDITIONS OF RECORDING:  This is a 16-channel EEG carried out with the patient in the unresponsive state.  DESCRIPTION:  The background activity is continuous and consist of a polymorphic delta rhythm that is seen diffusely distributed and is often superimposed with artifact.  On occasion, some faster superimposed beta is noted in the anterior regions as well.  Hyperventilation and intermittent photic stimulation were not performed.  No epileptiform activity was noted.  IMPRESSION:  This is an abnormal EEG secondary to general background slowing.  No epileptiform activity is noted.  This finding can be seen with a diffuse disturbance that is etiologically nonspecific, but may include a metabolic encephalopathy among other possibilities.          ______________________________ Thana Farr, MD    [id number] D:  06/10/2012 01:03:20  T:  06/10/2012 04:13:18  Job #:  [id number]

## 2012-06-10 NOTE — Progress Notes (Addendum)
eLink Physician-Brief Progress Note Patient Name: Glenda Gonzalez DOB: 1934/03/18 MRN: [id number]  Date of Service  06/10/2012   HPI/Events of Note   cvp is 2-3  RN says oliguria with 200cc in past 5h   Lab 06/09/12 1030 06/08/12 2309  CREATININE 1.07 1.06    Estimated Creatinine Clearance: 41.4 ml/min (by C-G formula based on Cr of 1.07).   Intake/Output      01/15 0701 - 01/16 0700   I.V. (mL/kg) 631.9 (8.1)   NG/GT 130   IV Piggyback 310   Total Intake(mL/kg) 1071.9 (13.7)   Urine (mL/kg/hr) 820 (0.4)   Total Output 820   Net +251.9        BP 134 sbp and HR 83 on diprivan   eICU Interventions  wil give 1L bolus (EF 55% on 06/09/12) and reassess   Intervention Category Intermediate Interventions: Oliguria - evaluation and management  Mikhai Bienvenue 06/10/2012, 12:01 AM ADEENDUM 4:00 AM   Ur OP improved. CVP 13 Sugars improved - moved to phase 3 ICU hyperglycemia  Protocol  Dr. Kalman Shan, M.D., [facility].C.P Pulmonary and Critical Care Medicine Staff Physician [facility] Pager: [phone], If no answer or between  15:00h - 7:00h: call [phone]  06/10/2012 4:00 AM

## 2012-06-10 NOTE — Progress Notes (Signed)
NEURO HOSPITALIST PROGRESS NOTE   SUBJECTIVE:                                                                                                                        Patient remains on propofol.  She shows no clinical seizure activity, withdrawing to pain in all extremities.   OBJECTIVE:                                                                                                                           Vital signs in last 24 hours: Temp:  [98.6 F (37 C)-99.7 F (37.6 C)] 99.3 F (37.4 C) (01/16 1000) Pulse Rate:  [78-103] 81  (01/16 1000) Resp:  [4-21] 17  (01/16 1000) BP: (99-180)/(46-81) 126/60 mmHg (01/16 1000) SpO2:  [100 %] 100 % (01/16 1000) FiO2 (%):  [40 %] 40 % (01/16 0835) Weight:  [79.1 kg (174 lb 6.1 oz)] 79.1 kg (174 lb 6.1 oz) (01/16 0500)  Intake/Output from previous day: 01/15 0701 - 01/16 0700 In: 2797.7 [I.V.:1157.7; NG/GT:330; IV Piggyback:1310] Out: 1745 [Urine:1745] Intake/Output this shift: Total I/O In: 421 [I.V.:191; Other:100; NG/GT:30; IV Piggyback:100] Out: 265 [Urine:265] Nutritional status: NPO  Past Medical History  Diagnosis Date  . Diabetes mellitus   . GERD (gastroesophageal reflux disease)   . Hypertension   . Diverticulitis   . Gout   . Essential and other specified forms of tremor   . Dementia   . CAD (coronary artery disease)   . CHF (congestive heart failure)     Diastolic dysfunction     Neurologic Exam:  Mental Status: Intubated, sedated on propofol, responds briskly to sternal rub.  Cranial Nerves: II: Visual fields grossly normal, pupils equal, round, reactive to light and accommodation III,IV, VI: ptosis not present, extra-ocular motions intact bilaterally V,VII: smile symmetric, facial light touch sensation normal bilaterally VIII: hearing normal bilaterally IX,X: gag reflex present XI: bilateral shoulder shrug XII: midline tongue extension Motor: Withdraws all extremities  from pain antigravity.  Sensory: as above Deep Tendon Reflexes: 2+ and symmetric throughout Plantars: Right: downgoing   Left: downgoing Cerebellar: Unable to test CV: pulses palpable throughout     Lab Results: Lab Results  Component Value Date/Time   CHOL 242* 10/12/2010  6:10 AM   Lipid Panel No results found for this basename: CHOL,TRIG,HDL,CHOLHDL,VLDL,LDLCALC in the last 72 hours  Studies/Results: Ct Head Wo Contrast  06/09/2012  *RADIOLOGY REPORT*  Clinical Data: Unresponsive.  CT HEAD WITHOUT CONTRAST  Technique:  Contiguous axial images were obtained from the base of the skull through the vertex without contrast.  Comparison: 07/20/2011  Findings: Patchy opacification of bilateral ethmoid air cells. Atherosclerotic and physiologic intracranial calcifications. Diffuse parenchymal atrophy. Patchy areas of hypoattenuation in deep and periventricular white matter bilaterally. Negative for acute intracranial hemorrhage, mass lesion, acute infarction, midline shift, or mass-effect. Acute infarct may be inapparent on noncontrast CT. Ventricles and sulci symmetric. Bone windows demonstrate no focal lesion.  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  IMPRESSION:  1. Negative for bleed or other acute intracranial process.  2. Atrophy and nonspecific white matter changes   Original Report Authenticated By: D. Andria Rhein, MD    Mr Maxine Glenn Head Wo Contrast  06/10/2012  *RADIOLOGY REPORT*  Clinical Data:  Altered mental status.  Possible cerebral infarct. Found down at home.  Hypertension.  MRI HEAD WITHOUT CONTRAST MRA HEAD WITHOUT CONTRAST  Technique:  Multiplanar, multiecho pulse sequences of the brain and surrounding structures were obtained without intravenous contrast. Angiographic images of the head were obtained using MRA technique without contrast.  Comparison:  CT head 06/09/2012, no prior MRI.  MRI HEAD  Findings:  Subcentimeter acute infarct is identified at the  right parietal gray-white junction without associated hemorrhage.  There is marked atrophy with prominence of the ventricles, cisterns and sulci.  Extensive chronic microvascular ischemic changes present in the periventricular and subcortical white matter. Scattered remote lacunar infarcts most notable in the left thalamus and right putamen.   There is a remote left occipital infarct with gliosis and encephalomalacia.  Hydrocephalus ex vacuo is present.  Multiple remote cerebellar infarcts are seen.  Partial empty sella.  No tonsillar herniation. Flow voids are maintained in the major intracranial vasculature appear Dependent fluid in the nasopharynx and sphenoid sinus.  Negative orbits, sinuses, and mastoids for acute process.  IMPRESSION: Subcentimeter solitary acute infarct right parietal gray-white junction without associated hemorrhage. Advanced atrophy with extensive chronic microvascular ischemic change.  MRA HEAD  Findings: The internal carotid arteries are widely patent.  The basilar artery is slightly small, in part due to origin of the right PCA from the carotid.  There is mild nonstenotic irregularity throughout the basilar.  Both vertebrals contribute to its formation with the  left dominant.  Significant multiple tandem lesions are seen throughout the distal right vertebral likely flow reducing. There is mild nonstenotic irregularity of the proximal middle cerebral arteries.  Moderate to severe stenosis of the proximal right anterior cerebral artery also involve the 08/02 and A3 segments.  The left anterior cerebral artery is dominant.  No proximal PCA stenosis.  No visible cerebellar branch occlusion.  IMPRESSION: Significant disease proximal right anterior cerebral.  Significant disease distal right vertebral.  Mild irregularity throughout the basilar.  No flow reducing carotid stenosis.   Original Report Authenticated By: Davonna Belling, M.D.    Mr Brain Wo Contrast  06/10/2012  *RADIOLOGY REPORT*   Clinical Data:  Altered mental status.  Possible cerebral infarct. Found down at home.  Hypertension.  MRI HEAD WITHOUT CONTRAST MRA HEAD WITHOUT CONTRAST  Technique:  Multiplanar, multiecho pulse sequences of the brain and surrounding structures were obtained without intravenous contrast. Angiographic images of the head were obtained using MRA technique without  contrast.  Comparison:  CT head 06/09/2012, no prior MRI.  MRI HEAD  Findings:  Subcentimeter acute infarct is identified at the right parietal gray-white junction without associated hemorrhage.  There is marked atrophy with prominence of the ventricles, cisterns and sulci.  Extensive chronic microvascular ischemic changes present in the periventricular and subcortical white matter. Scattered remote lacunar infarcts most notable in the left thalamus and right putamen.   There is a remote left occipital infarct with gliosis and encephalomalacia.  Hydrocephalus ex vacuo is present.  Multiple remote cerebellar infarcts are seen.  Partial empty sella.  No tonsillar herniation. Flow voids are maintained in the major intracranial vasculature appear Dependent fluid in the nasopharynx and sphenoid sinus.  Negative orbits, sinuses, and mastoids for acute process.  IMPRESSION: Subcentimeter solitary acute infarct right parietal gray-white junction without associated hemorrhage. Advanced atrophy with extensive chronic microvascular ischemic change.  MRA HEAD  Findings: The internal carotid arteries are widely patent.  The basilar artery is slightly small, in part due to origin of the right PCA from the carotid.  There is mild nonstenotic irregularity throughout the basilar.  Both vertebrals contribute to its formation with the  left dominant.  Significant multiple tandem lesions are seen throughout the distal right vertebral likely flow reducing. There is mild nonstenotic irregularity of the proximal middle cerebral arteries.  Moderate to severe stenosis of the  proximal right anterior cerebral artery also involve the 08/02 and A3 segments.  The left anterior cerebral artery is dominant.  No proximal PCA stenosis.  No visible cerebellar branch occlusion.  IMPRESSION: Significant disease proximal right anterior cerebral.  Significant disease distal right vertebral.  Mild irregularity throughout the basilar.  No flow reducing carotid stenosis.   Original Report Authenticated By: Davonna Belling, M.D.    Dg Chest Port 1 View  06/10/2012  *RADIOLOGY REPORT*  Clinical Data: Assess edema  PORTABLE CHEST - 1 VIEW  Comparison: Chest radiograph 06/09/2012  Findings: Normal cardiac silhouette.  Endotracheal tube, left central venous line, and NG tube are unchanged.  No pulmonary edema.  No pneumothorax.  Mild left basilar atelectasis.  IMPRESSION: Stable support apparatus.  No interval change.   Original Report Authenticated By: Genevive Bi, M.D.    Dg Chest Port 1 View  06/09/2012  *RADIOLOGY REPORT*  Clinical Data: Central line placement  PORTABLE CHEST - 1 VIEW  Comparison:   the previous day's study  Findings: Endotracheal tube has been repositioned, tip 3.3 cm above carina.  Left subclavian central line has been placed to the distal SVC.  No pneumothorax.  Nasogastric tube has been passed at least as far as the stomach, tip not seen.  Heart size upper limits normal as before.  Mildly tortuous ectatic thoracic aorta. Improved left lung aeration with some decrease in the perihilar atelectasis or infiltrates.  No effusion.  IMPRESSION:  1.  Support hardware projecting in expected location as above. 2.  No pneumothorax. 3.  Improved left lung aeration.   Original Report Authenticated By: D. Andria Rhein, MD    Dg Chest Portable 1 View  06/08/2012  *RADIOLOGY REPORT*  Clinical Data: Endotracheal tube position.  PORTABLE CHEST - 1 VIEW  Comparison: 12/19/2011  Findings: Interval placement endotracheal tube with tip in the right mainstem bronchus.  Consider retraction of the  tube about 3 cm.  Shallow inspiration.  Slightly increased density in the left lung may represent atelectasis.  Mild cardiac enlargement with normal pulmonary vascularity.  No blunting of costophrenic angles. No pneumothorax.  Degenerative changes in the shoulders.  IMPRESSION: Endotracheal tube tip is in the right mainstem bronchus.  Results discussed by telephone at 2340 hours on 06/08/2012 with Dr. Jeraldine Loots.   Original Report Authenticated By: Burman Nieves, M.D.     MEDICATIONS                                                                                                                        Scheduled:   . antiseptic oral rinse  1 application Mouth Rinse QID  . aspirin  325 mg Oral Daily  . bimatoprost  1 drop Both Eyes BID  . chlorhexidine  15 mL Mouth/Throat BID  . feeding supplement  30 mL Per Tube 5 X Daily  . feeding supplement (PROMOTE)  1,000 mL Per Tube Q24H  . furosemide  40 mg Intravenous Q12H  . heparin subcutaneous  5,000 Units Subcutaneous Q8H  . insulin aspart  2-6 Units Subcutaneous Q4H  . insulin glargine  10 Units Subcutaneous Q24H  . levetiracetam  1,000 mg Intravenous Q12H  . multivitamin  5 mL Per Tube Daily  . pantoprazole (PROTONIX) IV  40 mg Intravenous Q24H  . potassium chloride  40 mEq Per Tube Once    ASSESSMENT/PLAN:                                                                                                               Patient Active Hospital Problem List:  Status epilepticus (06/09/2012)   Assessment: No further seizure activity.  EEG shows no epileptiform activity.  MRI shows small infarct in the right parietal likely not the etiology of seizure but secondary to HTN.    Plan: Will decrease Keppra to 500 mg BID IV and would recommend maintain in hospital and adjust out patient.   Neurology will S/O-please call with any questions.   Felicie Morn PA-C [facility] [phone]  06/10/2012, 11:07 AM

## 2012-06-10 NOTE — Procedures (Signed)
Extubation Procedure Note  Patient Details:   Name: Glenda Gonzalez DOB: February 26, 1934 MRN: [id number]   Pt extubated to [facility] after successful SBT per MD order.  Pt has strong productive cough.  Able to vocalize.    Evaluation  O2 sats: stable throughout Complications: No apparent complications Patient did tolerate procedure well. Bilateral Breath Sounds: Clear;Diminished Suctioning: Oral Yes  Glenda Gonzalez 06/10/2012, 5:05 PM

## 2012-06-10 NOTE — Progress Notes (Signed)
MD made aware of pts last two CVP readings of 2 and 3 along with low UO. Order to infuse 1L bolus. Will continue to monitor.  Cyndie Chime [place]

## 2012-06-11 LAB — BASIC METABOLIC PANEL
BUN: 26 mg/dL — ABNORMAL HIGH (ref 6–23)
Calcium: 8.9 mg/dL (ref 8.4–10.5)
Creatinine, Ser: 0.97 mg/dL (ref 0.50–1.10)
GFR calc non Af Amer: 55 mL/min — ABNORMAL LOW (ref >90)
Glucose, Bld: 128 mg/dL — ABNORMAL HIGH (ref 70–99)
Potassium: 2.9 mEq/L — ABNORMAL LOW (ref 3.5–5.1)

## 2012-06-11 LAB — GLUCOSE, CAPILLARY
Glucose-Capillary: 145 mg/dL — ABNORMAL HIGH (ref 70–99)
Glucose-Capillary: 106 mg/dL — ABNORMAL HIGH (ref 70–99)

## 2012-06-11 MED ORDER — POTASSIUM CHLORIDE 10 MEQ/50ML IV SOLN
10.0000 meq | INTRAVENOUS | Status: AC
  Administered 2012-06-11 (×5): 10 meq via INTRAVENOUS
  Filled 2012-06-11 (×2): qty 50
  Filled 2012-06-11: qty 100
  Filled 2012-06-11 (×2): qty 50

## 2012-06-11 MED ORDER — POTASSIUM CHLORIDE 20 MEQ/15ML (10%) PO LIQD: 40.0000 meq | ORAL | Status: AC

## 2012-06-11 MED ORDER — LABETALOL HCL 5 MG/ML IV SOLN
20.0000 mg | INTRAVENOUS | Status: DC
  Administered 2012-06-11 – 2012-06-12 (×7): 20 mg via INTRAVENOUS
  Filled 2012-06-11 (×8): qty 4

## 2012-06-11 NOTE — Progress Notes (Signed)
Inpatient Diabetes Program Recommendations  AACE/ADA: New Consensus Statement on Inpatient Glycemic Control (2013)  Target Ranges:  Prepandial:   less than 140 mg/dL      Peak postprandial:   less than 180 mg/dL (1-2 hours)      Critically ill patients:  140 - 180 mg/dL    Results for KEYRA, VIRELLA (MRN [id number]) as of 06/11/2012 14:49  Ref. Range 06/11/2012 00:43 06/11/2012 04:02 06/11/2012 08:32 06/11/2012 12:28  Glucose-Capillary Latest Range: 70-99 mg/dL 409 (H) 811 (H) 914 (H) 145 (H)   Noted patient extubated.  Tube feeds stopped.  Patient currently NPO.  To be transferred to SDU.  Please adjust the following: 1. D/C ICU Glycemic Control Protocol 2. Change Novolog SSI to Moderate scale Q4 hours per Glycemic Control order set 3. D/C Novolog 4 units Q4 hours (tube feed coverage)   Note: Will follow. Ambrose Finland RN, MSN, CDE Diabetes Coordinator Inpatient Diabetes Program [phone]

## 2012-06-11 NOTE — Progress Notes (Signed)
eLink Physician-Brief Progress Note Patient Name: Glenda Gonzalez DOB: 1933/08/05 MRN: [id number]  Date of Service  06/11/2012   HPI/Events of Note  Patient quite active attempting to get OOB.  Concern for falls/safety.   eICU Interventions  Order for posey belt for patient safety.   Intervention Category Minor Interventions: Other:  DETERDING,ELIZABETH 06/11/2012, 2:33 AM

## 2012-06-11 NOTE — Progress Notes (Signed)
PULMONARY  / CRITICAL CARE MEDICINE  Name: Glenda Gonzalez MRN: [id number] DOB: 22-Jan-1934    LOS: 3  REFERRING MD:  EDP  CHIEF COMPLAINT:   Found down  BRIEF PATIENT DESCRIPTION: 79 yo with DM, CAD, CHF found down, frothing from her mouth, with possible seizure activity. In ED: hypertensive, intubated for airway protection.  LINES / TUBES: OETT 1/14 >>>1/16 OGT 1/14 >>> Foley 1/14 >>>  CULTURES: 1/14  Blood >>>  ANTIBIOTICS:  SIGNIFICANT EVENTS:  1/14 >>> Found down, frothing from her mouth, with possible seizure activity, hypertensive, intubated for airway protection. 1/14 ct head - neg acute process 1/15 eeg - neg focus 1/15 MRI brain>>>Subcentimeter solitary acute infarct right parietal gray-white<BR>junction without associated hemorrhage. Advanced atrophy with<BR>extensive chronic microvascular ischemic change 1/16 extubated  LEVEL OF CARE:  ICU  PRIMARY SERVICE:  PCCM  CONSULTANTS:   CODE STATUS:  Full  DIET:  TF  DVT Px:  Heparin  GI Px:  Protonix  INTERVAL HISTORY:  extubated  VITAL SIGNS: Temp:  [97.3 F (36.3 C)-100.4 F (38 C)] 97.9 F (36.6 C) (01/17 1100) Pulse Rate:  [80-109] 101  (01/17 1100) Resp:  [16-34] 17  (01/17 1100) BP: (120-202)/(61-109) 171/105 mmHg (01/17 1100) SpO2:  [95 %-100 %] 95 % (01/17 1100) Weight:  [80.3 kg (177 lb 0.5 oz)] 80.3 kg (177 lb 0.5 oz) (01/17 0500) HEMODYNAMICS: CVP:  [9 mmHg] 9 mmHg VENTILATOR SETTINGS:   INTAKE / OUTPUT: Intake/Output      01/16 0701 - 01/17 0700 01/17 0701 - 01/18 0700   I.V. (mL/kg) 1289.2 (16.1) 200 (2.5)   Other 160    NG/GT 70    IV Piggyback 205    Total Intake(mL/kg) 1724.2 (21.5) 200 (2.5)   Urine (mL/kg/hr) 2285 (1.2) 525 (1.2)   Total Output 2285 525   Net -560.8 -325          PHYSICAL EXAMINATION: General:  Sedated rass 1, agitated Neuro:  Alert, not following commands HEENT:  PERRL Cardiovascular:  RRR, no m/r/g Lungs:  Bilateral diminished Abdomen:  Obese,  soft, no r/g Musculoskeletal:  Bilateral pedal edema Skin:  Intact  LABS:  Lab 06/11/12 0545 06/10/12 0450 06/09/12 1030 06/09/12 1027 06/09/12 0530 06/09/12 0407 06/09/12 0028 06/08/12 2347 06/08/12 2309  HGB -- 9.8* -- -- -- -- -- -- 12.2  WBC -- 7.3 -- -- -- -- -- -- 9.1  PLT -- 206 -- -- -- -- -- -- 251  NA 144 142 140 -- -- -- -- -- --  K 2.9* 3.2* -- -- -- -- -- -- --  CL 105 105 100 -- -- -- -- -- --  CO2 26 26 28  -- -- -- -- -- --  GLUCOSE 128* 196* 172* -- -- -- -- -- --  BUN 26* 29* 15 -- -- -- -- -- --  CREATININE 0.97 1.15* 1.07 -- -- -- -- -- --  CALCIUM 8.9 8.2* 9.0 -- -- -- -- -- --  MG -- -- 1.6 -- -- -- 1.8 -- --  PHOS -- -- 3.1 -- -- -- -- -- --  AST -- 19 -- -- -- -- -- -- 60*  ALT -- 12 -- -- -- -- -- -- 25  ALKPHOS -- 165* -- -- -- -- -- -- 220*  BILITOT -- 0.2* -- -- -- -- -- -- 0.3  PROT -- 6.1 -- -- -- -- -- -- 8.5*  ALBUMIN -- 2.5* -- -- -- -- -- -- 3.3*  APTT -- -- -- -- -- -- -- --  37  INR -- -- -- -- -- -- -- -- 1.10  LATICACIDVEN -- -- -- -- -- -- -- 2.6* --  TROPONINI -- -- -- <0.30 <0.30 -- <0.30 -- --  PROCALCITON -- -- -- -- -- -- -- -- <0.10  PROBNP -- -- -- -- -- -- -- -- 234.3  O2SATVEN -- -- -- -- -- -- -- -- --  PHART -- -- -- -- -- 7.342* -- -- --  PCO2ART -- -- -- -- -- 49.1* -- -- --  PO2ART -- -- -- -- -- 398.0* -- -- --    Lab 06/11/12 0832 06/11/12 0402 06/11/12 0043 06/10/12 1946 06/10/12 1555  GLUCAP 111* 145* 142* 124* 198*    IMAGING: 1/16  PCXR >>> ETT  WNL corrected position, rotation, no defined infiltrates 1/14  Head CT >>> nad See above MRI, EEG  ECG: 1/14  12-lead ECG >>>  Sinus tachycardia, 1 degree AV block, new LBBB (not present in October 2013).  DIAGNOSES: Active Problems:  DM  HYPERTENSION  CONGESTIVE HEART FAILURE UNSPECIFIED  Acute encephalopathy  Acute respiratory failure  Status epilepticus  Hyperglycemia  ASSESSMENT / PLAN:  PULMONARY  A:  Acute respiratory failure in setting of status  epilepticus and possible acute pulmonary edema.  Possible aspiration, but CXR is underwhelming. P:   IS when able No distress  CARDIOVASCULAR Trend Troponin - flat and negative A: History of CHF, not convinced of edema,  Possible hypertensive emergency.  New LBBB since last ECG P:  Continue ASA Labetalol PRN 2D echo assessment - lvh, 55%, mild pa htn HTN, add iV labetolol with hold until orals obtained  RENAL  A:  Normal renal function.  Hypokalemia. P:   k supp Chem in am   GASTROINTESTINAL  A: r./o dysphagia P:   slp , re assess in am , if fail, then add ngt and T  ppi  HEMATOLOGIC  A:  DVT prevention P:  scd Sub  q heparin   INFECTIOUS  A:  No evidence of acute infection. P:   follow fever curve No role ABX  ENDOCRINE   A:  DM.  Hyperglycemia.   P:   ICU Glycemic Control Protocol, Phase 1  NEUROLOGIC  A:  Acute encephalopathy.  Possible status epilepticus.  Dementia and now likely delirium P:   Add carotid dopplers Avoid benzo Mobilize, PT Per neuro   CLINICAL SUMMARY: 79 yo with DM, CAD, CHF found down, frothing from her mouth, with possible seizure activity. In ED: hypertensive, intubated for airway protection.extubated, to sdu triad  I have personally obtained a history, examined the patient, evaluated laboratory and imaging results, formulated the assessment and plan and placed orders.   Mcarthur Rossetti. Tyson Alias, MD, FACP Pgr: [phone] [facility]

## 2012-06-11 NOTE — Progress Notes (Signed)
eLink Physician-Brief Progress Note Patient Name: Glenda Gonzalez DOB: [birth year] MRN: [id number]  Date of Service  06/11/2012   HPI/Events of Note  Hypokalemia   eICU Interventions  Potassium replaced   Intervention Category Intermediate Interventions: Electrolyte abnormality - evaluation and management  [name] 06/11/2012, 6:43 AM

## 2012-06-12 ENCOUNTER — Inpatient Hospital Stay (HOSPITAL_COMMUNITY): Payer: Medicare Other

## 2012-06-12 LAB — MAGNESIUM: Magnesium: 1.5 mg/dL (ref 1.5–2.5)

## 2012-06-12 LAB — GLUCOSE, CAPILLARY
Glucose-Capillary: 112 mg/dL — ABNORMAL HIGH (ref 70–99)
Glucose-Capillary: 112 mg/dL — ABNORMAL HIGH (ref 70–99)
Glucose-Capillary: 314 mg/dL — ABNORMAL HIGH (ref 70–99)

## 2012-06-12 LAB — PHOSPHORUS: Phosphorus: 2.9 mg/dL (ref 2.3–4.6)

## 2012-06-12 LAB — BASIC METABOLIC PANEL
Calcium: 9.2 mg/dL (ref 8.4–10.5)
GFR calc Af Amer: 69 mL/min — ABNORMAL LOW (ref >90)
GFR calc non Af Amer: 60 mL/min — ABNORMAL LOW (ref >90)
Sodium: 143 mEq/L (ref 135–145)

## 2012-06-12 MED ORDER — STARCH (THICKENING) PO POWD: ORAL | Status: DC | PRN

## 2012-06-12 MED ORDER — SODIUM CHLORIDE 0.9 % IJ SOLN
INTRAMUSCULAR | Status: AC
  Administered 2012-06-12: 21:00:00
  Filled 2012-06-12: qty 20

## 2012-06-12 MED ORDER — WHITE PETROLATUM GEL
Status: AC
  Administered 2012-06-12: 09:00:00
  Filled 2012-06-12: qty 5

## 2012-06-12 MED ORDER — MAGNESIUM SULFATE 40 MG/ML IJ SOLN
2.0000 g | Freq: Once | INTRAMUSCULAR | Status: AC
  Administered 2012-06-12: 2 g via INTRAVENOUS
  Filled 2012-06-12: qty 50

## 2012-06-12 MED ORDER — INSULIN ASPART 100 UNIT/ML ~~LOC~~ SOLN
0.0000 [IU] | Freq: Three times a day (TID) | SUBCUTANEOUS | Status: DC
  Administered 2012-06-12: 8 [IU] via SUBCUTANEOUS
  Administered 2012-06-13: 3 [IU] via SUBCUTANEOUS
  Administered 2012-06-13: 15 [IU] via SUBCUTANEOUS
  Administered 2012-06-13: 3 [IU] via SUBCUTANEOUS
  Administered 2012-06-14: 8 [IU] via SUBCUTANEOUS
  Administered 2012-06-14: 5 [IU] via SUBCUTANEOUS
  Administered 2012-06-14: 3 [IU] via SUBCUTANEOUS
  Administered 2012-06-15: 5 [IU] via SUBCUTANEOUS
  Administered 2012-06-15: 8 [IU] via SUBCUTANEOUS
  Administered 2012-06-15: 3 [IU] via SUBCUTANEOUS
  Administered 2012-06-16: 2 [IU] via SUBCUTANEOUS
  Administered 2012-06-16: 5 [IU] via SUBCUTANEOUS

## 2012-06-12 MED ORDER — POTASSIUM CHLORIDE 10 MEQ/100ML IV SOLN
10.0000 meq | INTRAVENOUS | Status: AC
  Administered 2012-06-12 (×3): 10 meq via INTRAVENOUS
  Filled 2012-06-12: qty 300

## 2012-06-12 MED ORDER — SODIUM CHLORIDE 0.9 % IJ SOLN
INTRAMUSCULAR | Status: AC
  Administered 2012-06-12: 12:00:00
  Filled 2012-06-12: qty 10

## 2012-06-12 MED ORDER — LABETALOL HCL 100 MG PO TABS
100.0000 mg | ORAL_TABLET | Freq: Two times a day (BID) | ORAL | Status: DC
  Administered 2012-06-12 – 2012-06-16 (×8): 100 mg via ORAL
  Filled 2012-06-12 (×9): qty 1

## 2012-06-12 MED ORDER — ASPIRIN 81 MG PO CHEW
81.0000 mg | CHEWABLE_TABLET | Freq: Every day | ORAL | Status: DC
  Administered 2012-06-12 – 2012-06-16 (×5): 81 mg via ORAL
  Filled 2012-06-12 (×5): qty 1

## 2012-06-12 MED ORDER — RESOURCE THICKENUP CLEAR PO POWD
ORAL | Status: DC | PRN
  Filled 2012-06-12: qty 125

## 2012-06-12 MED ORDER — INSULIN ASPART 100 UNIT/ML ~~LOC~~ SOLN: 0.0000 [IU] | SUBCUTANEOUS | Status: DC

## 2012-06-12 NOTE — Progress Notes (Signed)
Rehab Admissions Coordinator Note:  Patient was screened by Clois Dupes for appropriateness for an Inpatient Acute Rehab Consult.  At this time, we are recommending Inpatient Rehab consult as well as an O.T. Evaluation.  Clois Dupes, RN 06/12/2012, 1:25 PM  I can be reached at [phone].

## 2012-06-12 NOTE — Progress Notes (Signed)
Bilateral:  No evidence of hemodynamically significant internal carotid artery stenosis.   Vertebral artery flow is antegrade.  Right ICA has stenosis in the 40-59% range by velocities, probably due to vessel tortuosity.

## 2012-06-12 NOTE — Progress Notes (Signed)
Patient's B/P is 98/62, will hold the labetalol dose at this time. Will let MD know that it was held

## 2012-06-12 NOTE — Evaluation (Signed)
Clinical/Bedside Swallow Evaluation Patient Details  Name: Glenda Gonzalez MRN: [id number] Date of Birth: 06-13-1933  Today's Date: 06/12/2012 Time: 2956-2130 SLP Time Calculation (min): 20 min  Past Medical History:  Past Medical History  Diagnosis Date  . Diabetes mellitus   . GERD (gastroesophageal reflux disease)   . Hypertension   . Diverticulitis   . Gout   . Essential and other specified forms of tremor   . Dementia   . CAD (coronary artery disease)   . CHF (congestive heart failure)     Diastolic dysfunction   Past Surgical History:  Past Surgical History  Procedure Date  . Dilation and curettage of uterus   . Tumor removal    HPI:  79 y/o female admit on 1/14, after being found by family unresponsive with foaming at the mouth-  noted to have clonic activity of lips and feet- suspected to be seizing - failed to improve with Ativan-intubated by EDP for respiratory distress- started on Propofol- also was severely hypertensive. patient was extubated on 1/16 and more alert.    Assessment / Plan / Recommendation Clinical Impression  Patient presents with a mild oral, and a mild-moderate pharyngeal dysphagia, characterized by swallow initiation delays, decreased pharyngeal contraction and laryngeal elevation, and decreased mastication and oral control with solid textures. Patient tolerated nectar thick liquids with improved swallow initiation as noted per palpation of larynx.     Aspiration Risk  Moderate    Diet Recommendation Dysphagia 2 (Fine chop);Nectar-thick liquid   Liquid Administration via: Cup;No straw Medication Administration: Whole meds with puree Supervision: Full supervision/cueing for compensatory strategies Compensations: Slow rate;Small sips/bites;Follow solids with liquid Postural Changes and/or Swallow Maneuvers: Seated upright 90 degrees    Other  Recommendations Oral Care Recommendations: Oral care BID Other Recommendations: Order thickener  from pharmacy;Prohibited food (jello, ice cream, thin soups);Clarify dietary restrictions   Follow Up Recommendations  Skilled Nursing facility;Home health SLP;Inpatient Rehab    Frequency and Duration min 3x week  2 weeks   Pertinent Vitals/Pain     SLP Swallow Goals Patient will consume recommended diet without observed clinical signs of aspiration with: Supervision/safety Patient will utilize recommended strategies during swallow to increase swallowing safety with: Supervision/safety   Swallow Study Prior Functional Status       General Date of Onset: 06/08/12 HPI: 79 y/o female admit on 1/14, after being found by family unresponsive with foaming at the mouth-  noted to have clonic activity of lips and feet- suspected to be seizing - failed to improve with Ativan-intubated by EDP for respiratory distress- started on Propofol- also was severely hypertensive. patient was extubated on 1/16 and more alert.  Type of Study: Bedside swallow evaluation Diet Prior to this Study: NPO Temperature Spikes Noted: No Respiratory Status: Room air History of Recent Intubation: Yes Length of Intubations (days): 2 days Date extubated: 06/10/12 Behavior/Cognition: Alert;Cooperative;Pleasant mood Oral Cavity - Dentition: Edentulous (spouse stated "she doesnt wear dentures, she can eat peanuts) Self-Feeding Abilities: Needs assist Patient Positioning: Upright in bed Baseline Vocal Quality: Clear Volitional Cough: Strong Volitional Swallow: Unable to elicit    Oral/Motor/Sensory Function Overall Oral Motor/Sensory Function: Appears within functional limits for tasks assessed   Ice Chips Ice chips: Not tested   Thin Liquid Thin Liquid: Impaired Presentation: Cup Pharyngeal  Phase Impairments: Decreased hyoid-laryngeal movement    Nectar Thick Nectar Thick Liquid: Within functional limits Presentation: Cup;Self Fed   Honey Thick Honey Thick Liquid: Not tested   Puree Puree: Within functional  limits Presentation: Spoon   Solid   GO    Solid: Impaired Oral Phase Impairments: Impaired anterior to posterior transit;Other (comment) Pharyngeal Phase Impairments: Multiple swallows       Elio Forget Tarrell 06/12/2012,7:58 PM  Angela Nevin, MA, CCC-SLP [facility] Speech-Language Pathologist

## 2012-06-12 NOTE — Progress Notes (Signed)
[facility] PROGRESS NOTE  Glenda Gonzalez JXB:[id number] DOB: February 24, 1934 DOA: 06/08/2012 PCP: Billee Cashing, MD  Brief narrative: 79 y/o female found by family unresponsive with foaming at the mouth-  noted to have clonic activity of lips and feet- suspected to be seizing - failed to improve with Ativan-intubated by EDP for respiratory distress- started on Propofol- also was severely hypertensive  She was extubated on 1/16 Alert today, talking and oriented when questioned- aware that she is in the hospital because of seizures. Found to be in chest posey and hand mitts but not restless. C/o being hungry.   Assessment/Plan: Principal Problem:  *Status epilepticus/ Acute encephalopathy/ Acute respiratory failure Now on Keppra- being managed by Neuro- Lacunar infarct seen on CT head but per neuro it likely did not cause the seizures but resulted from HTn on admission   DM Sliding scale AC/HS- passed swallow eval today   HYPERTENSION BP high this am reasonable later int he day   CONGESTIVE HEART FAILURE??? There was a suspicion of pulm edema on admission- ECHO did not reveal any significant pathology- EF 50-55%, mild LVH and mild elevation of pulm pressures  Hypokalemia Replace- and recheck     Code Status: full code Family Communication: none Disposition Plan: follow in sdu- start PT DVT prophylaxis: heparin   Consultants:  neuro  Procedures: OETT 1/14 >>>1/16    Objective: Filed Vitals:   06/12/12 0700 06/12/12 0800 06/12/12 1200 06/12/12 1606  BP:  154/104 98/67   Pulse:      Temp: 98.3 F (36.8 C)  98.1 F (36.7 C) 97.9 F (36.6 C)  TempSrc: Oral  Oral Axillary  Resp:      Height:      Weight:      SpO2:        Intake/Output Summary (Last 24 hours) at 06/12/12 1740 Last data filed at 06/12/12 1606  Gross per 24 hour  Intake    950 ml  Output   1975 ml  Net  -1025 ml    Exam:   General:  Alert, laying in bed- oriented x 3, no  distress  Cardiovascular: RRR, no murmurs  Respiratory: CTA b/l   Abdomen: soft, NT, ND, BS+  Ext: no c/c/e  Data Reviewed: Basic Metabolic Panel:  Lab 06/12/12 1308 06/11/12 0545 06/10/12 0450 06/09/12 1030 06/09/12 0028 06/08/12 2309  NA 143 144 142 140 -- 133*  K 3.3* 2.9* 3.2* 3.1* -- 3.4*  CL 107 105 105 100 -- 91*  CO2 25 26 26 28  -- 26  GLUCOSE 99 128* 196* 172* -- 398*  BUN 19 26* 29* 15 -- 13  CREATININE 0.91 0.97 1.15* 1.07 -- 1.06  CALCIUM 9.2 8.9 8.2* 9.0 -- 9.5  MG 1.5 -- -- 1.6 1.8 --  PHOS 2.9 -- -- 3.1 -- --   Liver Function Tests:  Lab 06/10/12 0450 06/08/12 2309  AST 19 60*  ALT 12 25  ALKPHOS 165* 220*  BILITOT 0.2* 0.3  PROT 6.1 8.5*  ALBUMIN 2.5* 3.3*   No results found for this basename: LIPASE:5,AMYLASE:5 in the last 168 hours No results found for this basename: AMMONIA:5 in the last 168 hours CBC:  Lab 06/10/12 0450 06/08/12 2309  WBC 7.3 9.1  NEUTROABS 4.6 5.5  HGB 9.8* 12.2  HCT 30.8* 36.8  MCV 83.7 82.1  PLT 206 251   Cardiac Enzymes:  Lab 06/09/12 1027 06/09/12 0530 06/09/12 0028  CKTOTAL -- -- --  CKMB -- -- --  CKMBINDEX -- -- --  TROPONINI <0.30 <0.30 <0.30   BNP (last 3 results)  Basename 06/08/12 2309  PROBNP 234.3   CBG:  Lab 06/12/12 1729 06/12/12 0742 06/12/12 0404 06/12/12 0024 06/11/12 2024  GLUCAP 255* 112* 112* 115* 188*    Recent Results (from the past 240 hour(s))  URINE CULTURE     Status: Normal   Collection Time   06/08/12 11:37 PM      Component Value Range Status Comment   Specimen Description URINE, CATHETERIZED   Final    Special Requests NONE   Final    Culture  Setup Time 06/09/2012 00:17   Final    Colony Count NO GROWTH   Final    Culture NO GROWTH   Final    Report Status 06/10/2012 FINAL   Final   CULTURE, BLOOD (ROUTINE X 2)     Status: Normal (Preliminary result)   Collection Time   06/08/12 11:48 PM      Component Value Range Status Comment   Specimen Description BLOOD RIGHT HAND    Final    Special Requests BOTTLES DRAWN AEROBIC ONLY 6CC   Final    Culture  Setup Time 06/09/2012 08:47   Final    Culture     Final    Value:        BLOOD CULTURE RECEIVED NO GROWTH TO DATE CULTURE WILL BE HELD FOR 5 DAYS BEFORE ISSUING A FINAL NEGATIVE REPORT   Report Status PENDING   Incomplete   CULTURE, BLOOD (ROUTINE X 2)     Status: Normal (Preliminary result)   Collection Time   06/08/12 11:50 PM      Component Value Range Status Comment   Specimen Description BLOOD ARM LEFT   Final    Special Requests BOTTLES DRAWN AEROBIC AND ANAEROBIC 10CC   Final    Culture  Setup Time 06/09/2012 06:15   Final    Culture     Final    Value: GRAM POSITIVE RODS     Note: Gram Stain Report Called to,Read Back By and Verified With: GADE SANTANELLA 06/11/12 1440 BY SMITHERJ   Report Status PENDING   Incomplete   MRSA PCR SCREENING     Status: Normal   Collection Time   06/09/12  2:36 AM      Component Value Range Status Comment   MRSA by PCR NEGATIVE  NEGATIVE Final      Studies: Ct Head Wo Contrast  06/09/2012  *RADIOLOGY REPORT*  Clinical Data: Unresponsive.  CT HEAD WITHOUT CONTRAST  Technique:  Contiguous axial images were obtained from the base of the skull through the vertex without contrast.  Comparison: 07/20/2011  Findings: Patchy opacification of bilateral ethmoid air cells. Atherosclerotic and physiologic intracranial calcifications. Diffuse parenchymal atrophy. Patchy areas of hypoattenuation in deep and periventricular white matter bilaterally. Negative for acute intracranial hemorrhage, mass lesion, acute infarction, midline shift, or mass-effect. Acute infarct may be inapparent on noncontrast CT. Ventricles and sulci symmetric. Bone windows demonstrate no focal lesion.  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  IMPRESSION:  1. Negative for bleed or other acute intracranial process.  2. Atrophy and nonspecific white matter changes   Original Report Authenticated  By: D. Andria Rhein, MD    Mr Maxine Glenn Head Wo Contrast  06/10/2012  *RADIOLOGY REPORT*  Clinical Data:  Altered mental status.  Possible cerebral infarct. Found down at home.  Hypertension.  MRI HEAD WITHOUT CONTRAST MRA HEAD WITHOUT CONTRAST  Technique:  Multiplanar, multiecho  pulse sequences of the brain and surrounding structures were obtained without intravenous contrast. Angiographic images of the head were obtained using MRA technique without contrast.  Comparison:  CT head 06/09/2012, no prior MRI.  MRI HEAD  Findings:  Subcentimeter acute infarct is identified at the right parietal gray-white junction without associated hemorrhage.  There is marked atrophy with prominence of the ventricles, cisterns and sulci.  Extensive chronic microvascular ischemic changes present in the periventricular and subcortical white matter. Scattered remote lacunar infarcts most notable in the left thalamus and right putamen.   There is a remote left occipital infarct with gliosis and encephalomalacia.  Hydrocephalus ex vacuo is present.  Multiple remote cerebellar infarcts are seen.  Partial empty sella.  No tonsillar herniation. Flow voids are maintained in the major intracranial vasculature appear Dependent fluid in the nasopharynx and sphenoid sinus.  Negative orbits, sinuses, and mastoids for acute process.  IMPRESSION: Subcentimeter solitary acute infarct right parietal gray-white junction without associated hemorrhage. Advanced atrophy with extensive chronic microvascular ischemic change.  MRA HEAD  Findings: The internal carotid arteries are widely patent.  The basilar artery is slightly small, in part due to origin of the right PCA from the carotid.  There is mild nonstenotic irregularity throughout the basilar.  Both vertebrals contribute to its formation with the  left dominant.  Significant multiple tandem lesions are seen throughout the distal right vertebral likely flow reducing. There is mild nonstenotic irregularity  of the proximal middle cerebral arteries.  Moderate to severe stenosis of the proximal right anterior cerebral artery also involve the 08/02 and A3 segments.  The left anterior cerebral artery is dominant.  No proximal PCA stenosis.  No visible cerebellar branch occlusion.  IMPRESSION: Significant disease proximal right anterior cerebral.  Significant disease distal right vertebral.  Mild irregularity throughout the basilar.  No flow reducing carotid stenosis.   Original Report Authenticated By: Davonna Belling, M.D.    Mr Brain Wo Contrast  06/10/2012  *RADIOLOGY REPORT*  Clinical Data:  Altered mental status.  Possible cerebral infarct. Found down at home.  Hypertension.  MRI HEAD WITHOUT CONTRAST MRA HEAD WITHOUT CONTRAST  Technique:  Multiplanar, multiecho pulse sequences of the brain and surrounding structures were obtained without intravenous contrast. Angiographic images of the head were obtained using MRA technique without contrast.  Comparison:  CT head 06/09/2012, no prior MRI.  MRI HEAD  Findings:  Subcentimeter acute infarct is identified at the right parietal gray-white junction without associated hemorrhage.  There is marked atrophy with prominence of the ventricles, cisterns and sulci.  Extensive chronic microvascular ischemic changes present in the periventricular and subcortical white matter. Scattered remote lacunar infarcts most notable in the left thalamus and right putamen.   There is a remote left occipital infarct with gliosis and encephalomalacia.  Hydrocephalus ex vacuo is present.  Multiple remote cerebellar infarcts are seen.  Partial empty sella.  No tonsillar herniation. Flow voids are maintained in the major intracranial vasculature appear Dependent fluid in the nasopharynx and sphenoid sinus.  Negative orbits, sinuses, and mastoids for acute process.  IMPRESSION: Subcentimeter solitary acute infarct right parietal gray-white junction without associated hemorrhage. Advanced atrophy with  extensive chronic microvascular ischemic change.  MRA HEAD  Findings: The internal carotid arteries are widely patent.  The basilar artery is slightly small, in part due to origin of the right PCA from the carotid.  There is mild nonstenotic irregularity throughout the basilar.  Both vertebrals contribute to its formation with the  left dominant.  Significant multiple tandem lesions are seen throughout the distal right vertebral likely flow reducing. There is mild nonstenotic irregularity of the proximal middle cerebral arteries.  Moderate to severe stenosis of the proximal right anterior cerebral artery also involve the 08/02 and A3 segments.  The left anterior cerebral artery is dominant.  No proximal PCA stenosis.  No visible cerebellar branch occlusion.  IMPRESSION: Significant disease proximal right anterior cerebral.  Significant disease distal right vertebral.  Mild irregularity throughout the basilar.  No flow reducing carotid stenosis.   Original Report Authenticated By: Davonna Belling, M.D.    Dg Chest Port 1 View  06/12/2012  *RADIOLOGY REPORT*  Clinical Data: Evaluate atelectasis  PORTABLE CHEST - 1 VIEW  Comparison: 06/10/2012; 06/09/2012; 12/19/2011  Findings: Grossly unchanged enlarged cardiac silhouette and mediastinal contours, possibly accentuated due to persistently reduced lung volumes.  Interval extubation and removal of enteric tube.  Otherwise, stable position remain support apparatus.  No pneumothorax.  Pulmonary venous congestion without frank evidence of edema.  Grossly unchanged perihilar and medial basilar opacities.  No new focal airspace opacities.  No definite pleural effusion.  Unchanged bones.  IMPRESSION: 1.  Interval extubation and removal of enteric tube.  No pneumothorax. 2.  Pulmonary venous congestion without frank evidence of edema.  3. Grossly unchanged perihilar and medial basilar opacities, atelectasis versus infiltrate.   Original Report Authenticated By: Tacey Ruiz, MD     Dg Chest Port 1 View  06/10/2012  *RADIOLOGY REPORT*  Clinical Data: Assess edema  PORTABLE CHEST - 1 VIEW  Comparison: Chest radiograph 06/09/2012  Findings: Normal cardiac silhouette.  Endotracheal tube, left central venous line, and NG tube are unchanged.  No pulmonary edema.  No pneumothorax.  Mild left basilar atelectasis.  IMPRESSION: Stable support apparatus.  No interval change.   Original Report Authenticated By: Genevive Bi, M.D.    Dg Chest Port 1 View  06/09/2012  *RADIOLOGY REPORT*  Clinical Data: Central line placement  PORTABLE CHEST - 1 VIEW  Comparison:   the previous day's study  Findings: Endotracheal tube has been repositioned, tip 3.3 cm above carina.  Left subclavian central line has been placed to the distal SVC.  No pneumothorax.  Nasogastric tube has been passed at least as far as the stomach, tip not seen.  Heart size upper limits normal as before.  Mildly tortuous ectatic thoracic aorta. Improved left lung aeration with some decrease in the perihilar atelectasis or infiltrates.  No effusion.  IMPRESSION:  1.  Support hardware projecting in expected location as above. 2.  No pneumothorax. 3.  Improved left lung aeration.   Original Report Authenticated By: D. Andria Rhein, MD    Dg Chest Portable 1 View  06/08/2012  *RADIOLOGY REPORT*  Clinical Data: Endotracheal tube position.  PORTABLE CHEST - 1 VIEW  Comparison: 12/19/2011  Findings: Interval placement endotracheal tube with tip in the right mainstem bronchus.  Consider retraction of the tube about 3 cm.  Shallow inspiration.  Slightly increased density in the left lung may represent atelectasis.  Mild cardiac enlargement with normal pulmonary vascularity.  No blunting of costophrenic angles. No pneumothorax.  Degenerative changes in the shoulders.  IMPRESSION: Endotracheal tube tip is in the right mainstem bronchus.  Results discussed by telephone at 2340 hours on 06/08/2012 with Dr. Jeraldine Loots.   Original Report  Authenticated By: Burman Nieves, M.D.     Scheduled Meds:    . antiseptic oral rinse  1 application Mouth Rinse QID  . aspirin  81  mg Oral Daily  . bimatoprost  1 drop Both Eyes BID  . chlorhexidine  15 mL Mouth/Throat BID  . feeding supplement  30 mL Per Tube 5 X Daily  . feeding supplement (PROMOTE)  1,000 mL Per Tube Q24H  . heparin subcutaneous  5,000 Units Subcutaneous Q8H  . insulin aspart  0-15 Units Subcutaneous TID WC  . labetalol  20 mg Intravenous Q4H  . levetiracetam  500 mg Intravenous Q12H  . multivitamin  5 mL Per Tube Daily  . pantoprazole (PROTONIX) IV  40 mg Intravenous Q24H   Continuous Infusions:    . sodium chloride 50 mL/hr at 06/12/12 0900    ________________________________________________________________________  Time spent: 40 min    [facility]  [facility] Pager [phone] If 8PM-8AM, please contact night-coverage at [email or web address], password [facility] 06/12/2012, 5:40 PM  LOS: 4 days

## 2012-06-12 NOTE — Progress Notes (Signed)
Hyperglycemia   SSI ordered  Agitation   Restrains renewed

## 2012-06-12 NOTE — Evaluation (Addendum)
Physical Therapy Evaluation Patient Details Name: Glenda Gonzalez MRN: [id number] DOB: [birth year] Today's Date: 06/12/2012 Time: 3086-5784 PT Time Calculation (min): 28 min  PT Assessment / Plan / Recommendation Clinical Impression  Pt is 79 yo with DM, CAD, CHF found down, frothing from her mouth, with possible seizure activity.  Pt intubated for airway protection 06/08/12 and extubated 06/11/12.  Pt slow to process and needs extra time to complete task.  Pt will benefit from acute PT services to improve overall mobility and prepare for safe d/c to next venue.    PT Assessment  Patient needs continued PT services    Follow Up Recommendations  CIR (OT consult)    Barriers to Discharge None      Equipment Recommendations  Rolling walker with 5" wheels    Recommendations for Other Services Rehab consult   Frequency Min 3X/week    Precautions / Restrictions Precautions Precautions: Fall Restrictions Weight Bearing Restrictions: No   Pertinent Vitals/Pain No c/o pain when asked       Mobility  Bed Mobility Bed Mobility: Supine to Sit;Sitting - Scoot to Edge of Bed Supine to Sit: 2: Max assist Sitting - Scoot to [place] of Bed: 2: Max assist Details for Bed Mobility Assistance: (A) to elevate trunk OOB and advance LE to EOB with max cues for technique Transfers Transfers: Sit to Stand;Stand to Sit;Stand Pivot Transfers Sit to Stand: 1: +2 Total assist;From bed Sit to Stand: Patient Percentage: 40% Stand to Sit: 1: +2 Total assist;To chair/3-in-1 Stand to Sit: Patient Percentage: 40% Stand Pivot Transfers: 1: +2 Total assist Stand Pivot Transfers: Patient Percentage: 50% Details for Transfer Assistance: +2 (A) to initiate transfer and slowly descend to recliner with cues for hand placement and LE placement.  (A) to advance hips to recliner and LE placement with stand pivot transfers. Ambulation/Gait Ambulation/Gait Assistance: Not tested (comment)     PT Diagnosis:  Difficulty walking;Generalized weakness;Acute pain  PT Problem List: Decreased strength;Decreased activity tolerance;Decreased balance;Decreased mobility;Decreased coordination;Decreased knowledge of use of DME;Decreased cognition PT Treatment Interventions: DME instruction;Gait training;Functional mobility training;Therapeutic activities;Therapeutic exercise;Balance training;Neuromuscular re-education;Cognitive remediation;Patient/family education   PT Goals Acute Rehab PT Goals PT Goal Formulation: With patient Time For Goal Achievement: 06/26/12 Potential to Achieve Goals: Good Pt will go Supine/Side to Sit: with supervision PT Goal: Supine/Side to Sit - Progress: Goal set today Pt will Sit at Edge of Bed: with modified independence;1-2 min PT Goal: Sit at Edge Of Bed - Progress: Goal set today Pt will go Sit to Supine/Side: with min assist PT Goal: Sit to Supine/Side - Progress: Goal set today Pt will go Sit to Stand: with min assist PT Goal: Sit to Stand - Progress: Goal set today Pt will go Stand to Sit: with min assist PT Goal: Stand to Sit - Progress: Goal set today Pt will Transfer Bed to Chair/Chair to Bed: with min assist PT Transfer Goal: Bed to Chair/Chair to Bed - Progress: Goal set today Pt will Stand: with supervision;1 - 2 min PT Goal: Stand - Progress: Goal set today Pt will Ambulate: 16 - 50 feet;with min assist;with rolling walker PT Goal: Ambulate - Progress: Goal set today  Visit Information  Last PT Received On: 06/12/12 Assistance Needed: +2    Subjective Data  Subjective: "I'm feeling ok." Patient Stated Goal: did not set   Prior Functioning  Home Living Lives With: Spouse;Son Available Help at Discharge: Family (son works during the day) Type of Home: House Home Access: Ramped entrance Home  Layout: One level Bathroom Shower/Tub:  (takes sponge bath) Bathroom Toilet: Standard Bathroom Accessibility: No Home Adaptive Equipment: Bedside  commode/3-in-1;Walker - rolling;Straight cane Prior Function Level of Independence: Needs assistance Needs Assistance: Bathing;Toileting;Light Housekeeping;Meal Prep;Gait Bath: Moderate Toileting: Maximal Meal Prep: Unable to assess Light Housekeeping: Unable to assess Gait Assistance: walks with RW or cane with supervision Communication Communication: Expressive difficulties (slow speech)    Cognition  Overall Cognitive Status: Impaired Area of Impairment: Problem solving Arousal/Alertness: Awake/alert Orientation Level: Oriented X4 / Intact Behavior During Session: WFL for tasks performed Problem Solving: Pt very slow to process and takes ~3 seconds to respond to questions asked.    Extremity/Trunk Assessment Right Lower Extremity Assessment RLE ROM/Strength/Tone: Deficits;Unable to fully assess;Due to impaired cognition RLE ROM/Strength/Tone Deficits: 4+/5 quad unable to further assess  RLE Sensation:  (unable to fully assess due to cognition) RLE Coordination: Deficits (impaird overall coordination) Left Lower Extremity Assessment LLE ROM/Strength/Tone: Unable to fully assess;Due to impaired cognition;Deficits LLE ROM/Strength/Tone Deficits: 4+/5 quad unable to fully assess LLE Sensation:  (unable to fully assess) LLE Coordination: Deficits (impaird overall coordination)   Balance Balance Balance Assessed: Yes Static Sitting Balance Static Sitting - Balance Support: Feet supported Static Sitting - Level of Assistance: 5: Stand by assistance Static Standing Balance Static Standing - Balance Support: Bilateral upper extremity supported Static Standing - Level of Assistance: 1: +2 Total assist;Patient percentage (comment) (50%) Static Standing - Comment/# of Minutes: +2 (A) to maintain balance due to posterior lean. Max cues for upright posture.  End of Session PT - End of Session Equipment Utilized During Treatment: Gait belt Activity Tolerance: Patient limited by  fatigue Patient left: in chair;with call bell/phone within reach Nurse Communication: Mobility status  GP     Wilhelmena Zea 06/12/2012, 1:18 PM Jake Shark, PT DPT [phone]

## 2012-06-13 DIAGNOSIS — I10 Essential (primary) hypertension: Secondary | ICD-10-CM

## 2012-06-13 LAB — GLUCOSE, CAPILLARY: Glucose-Capillary: 135 mg/dL — ABNORMAL HIGH (ref 70–99)

## 2012-06-13 LAB — BASIC METABOLIC PANEL
Calcium: 8.9 mg/dL (ref 8.4–10.5)
Creatinine, Ser: 1.02 mg/dL (ref 0.50–1.10)
GFR calc Af Amer: 60 mL/min — ABNORMAL LOW (ref >90)
GFR calc non Af Amer: 52 mL/min — ABNORMAL LOW (ref >90)
Sodium: 141 mEq/L (ref 135–145)

## 2012-06-13 LAB — MAGNESIUM: Magnesium: 2.2 mg/dL (ref 1.5–2.5)

## 2012-06-13 LAB — CBC
MCHC: 31.6 g/dL (ref 30.0–36.0)
Platelets: 199 10*3/uL (ref 150–400)
RDW: 18.4 % — ABNORMAL HIGH (ref 11.5–15.5)
WBC: 6.7 10*3/uL (ref 4.0–10.5)

## 2012-06-13 LAB — CULTURE, BLOOD (ROUTINE X 2)

## 2012-06-13 MED ORDER — PANTOPRAZOLE SODIUM 40 MG PO TBEC
40.0000 mg | DELAYED_RELEASE_TABLET | Freq: Every day | ORAL | Status: DC
  Administered 2012-06-13 – 2012-06-15 (×3): 40 mg via ORAL
  Filled 2012-06-13 (×3): qty 1

## 2012-06-13 NOTE — Progress Notes (Signed)
[facility] PROGRESS NOTE  MAYME PROFETA RUE:[id number] DOB: 03/23/1934 DOA: 06/08/2012 PCP: Billee Cashing, MD  Brief narrative: 79 y/o female found by family unresponsive with foaming at the mouth-  noted to have clonic activity of lips and feet- suspected to be seizing - failed to improve with Ativan-intubated by EDP for respiratory distress- started on Propofol- also was severely hypertensive  She was extubated on 1/16  Assessment/Plan: Principal Problem:  *Status epilepticus/ Acute encephalopathy/ Acute respiratory failure Now on Keppra and stable Lacunar infarct seen on CT head but per neuro it likely did not cause the seizures but resulted from HTn on admission   DM Sliding scale AC/HS- passed swallow eval - eating well   HYPERTENSION BP high - not on hypertensives at home- have added labetalol   CONGESTIVE HEART FAILURE??? There was a suspicion of pulm edema on admission- ECHO did not reveal any significant pathology- EF 50-55%, mild LVH and mild elevation of pulm pressures  Hypokalemia Replaced    Code Status: full code Family Communication: none Disposition Plan: transfer to med/surg DVT prophylaxis: heparin   Consultants:  neuro  Procedures: OETT 1/14 >>>1/16  Subjective: Alert and siting in the chair. Mild cough with white sputum- very weak so does not agree to removing foley yet- declining rehab facility and wants to go home with family.   Objective: Filed Vitals:   06/13/12 0900 06/13/12 1030 06/13/12 1142 06/13/12 1619  BP: 175/92  157/61 152/111  Pulse:  75    Temp: 98.1 F (36.7 C)  98.3 F (36.8 C) 98 F (36.7 C)  TempSrc: Oral  Oral Oral  Resp:      Height:      Weight:      SpO2: 98%       Intake/Output Summary (Last 24 hours) at 06/13/12 1739 Last data filed at 06/13/12 1619  Gross per 24 hour  Intake 1905.83 ml  Output   2026 ml  Net -120.17 ml    Exam:   General:  Alert, laying in bed- oriented x 3, no  distress  Cardiovascular: RRR, no murmurs  Respiratory: CTA b/l   Abdomen: soft, NT, ND, BS+  Ext: no c/c/e  Data Reviewed: Basic Metabolic Panel:  Lab 06/13/12 1478 06/12/12 0400 06/11/12 0545 06/10/12 0450 06/09/12 1030 06/09/12 0028  NA 141 143 144 142 140 --  K 3.9 3.3* 2.9* 3.2* 3.1* --  CL 107 107 105 105 100 --  CO2 23 25 26 26 28  --  GLUCOSE 267* 99 128* 196* 172* --  BUN 25* 19 26* 29* 15 --  CREATININE 1.02 0.91 0.97 1.15* 1.07 --  CALCIUM 8.9 9.2 8.9 8.2* 9.0 --  MG 2.2 1.5 -- -- 1.6 1.8  PHOS -- 2.9 -- -- 3.1 --   Liver Function Tests:  Lab 06/10/12 0450 06/08/12 2309  AST 19 60*  ALT 12 25  ALKPHOS 165* 220*  BILITOT 0.2* 0.3  PROT 6.1 8.5*  ALBUMIN 2.5* 3.3*   No results found for this basename: LIPASE:5,AMYLASE:5 in the last 168 hours No results found for this basename: AMMONIA:5 in the last 168 hours CBC:  Lab 06/13/12 0339 06/10/12 0450 06/08/12 2309  WBC 6.7 7.3 9.1  NEUTROABS -- 4.6 5.5  HGB 9.3* 9.8* 12.2  HCT 29.4* 30.8* 36.8  MCV 83.8 83.7 82.1  PLT 199 206 251   Cardiac Enzymes:  Lab 06/09/12 1027 06/09/12 0530 06/09/12 0028  CKTOTAL -- -- --  CKMB -- -- --  CKMBINDEX -- -- --  TROPONINI <0.30 <0.30 <0.30   BNP (last 3 results)  Basename 06/08/12 2309  PROBNP 234.3   CBG:  Lab 06/13/12 1716 06/13/12 1130 06/13/12 0849 06/12/12 2212 06/12/12 1729  GLUCAP 135* 392* 193* 314* 255*    Recent Results (from the past 240 hour(s))  URINE CULTURE     Status: Normal   Collection Time   06/08/12 11:37 PM      Component Value Range Status Comment   Specimen Description URINE, CATHETERIZED   Final    Special Requests NONE   Final    Culture  Setup Time 06/09/2012 00:17   Final    Colony Count NO GROWTH   Final    Culture NO GROWTH   Final    Report Status 06/10/2012 FINAL   Final   CULTURE, BLOOD (ROUTINE X 2)     Status: Normal (Preliminary result)   Collection Time   06/08/12 11:48 PM      Component Value Range Status Comment     Specimen Description BLOOD RIGHT HAND   Final    Special Requests BOTTLES DRAWN AEROBIC ONLY 6CC   Final    Culture  Setup Time 06/09/2012 08:47   Final    Culture     Final    Value:        BLOOD CULTURE RECEIVED NO GROWTH TO DATE CULTURE WILL BE HELD FOR 5 DAYS BEFORE ISSUING A FINAL NEGATIVE REPORT   Report Status PENDING   Incomplete   CULTURE, BLOOD (ROUTINE X 2)     Status: Normal   Collection Time   06/08/12 11:50 PM      Component Value Range Status Comment   Specimen Description BLOOD ARM LEFT   Final    Special Requests BOTTLES DRAWN AEROBIC AND ANAEROBIC 10CC   Final    Culture  Setup Time 06/09/2012 06:15   Final    Culture     Final    Value: DIPHTHEROIDS(CORYNEBACTERIUM SPECIES)     Note: Standardized susceptibility testing for this organism is not available.     Note: Gram Stain Report Called to,Read Back By and Verified With: GADE SANTANELLA 06/11/12 1440 BY SMITHERJ   Report Status 06/13/2012 FINAL   Final   MRSA PCR SCREENING     Status: Normal   Collection Time   06/09/12  2:36 AM      Component Value Range Status Comment   MRSA by PCR NEGATIVE  NEGATIVE Final      Studies: Ct Head Wo Contrast  06/09/2012  *RADIOLOGY REPORT*  Clinical Data: Unresponsive.  CT HEAD WITHOUT CONTRAST  Technique:  Contiguous axial images were obtained from the base of the skull through the vertex without contrast.  Comparison: 07/20/2011  Findings: Patchy opacification of bilateral ethmoid air cells. Atherosclerotic and physiologic intracranial calcifications. Diffuse parenchymal atrophy. Patchy areas of hypoattenuation in deep and periventricular white matter bilaterally. Negative for acute intracranial hemorrhage, mass lesion, acute infarction, midline shift, or mass-effect. Acute infarct may be inapparent on noncontrast CT. Ventricles and sulci symmetric. Bone windows demonstrate no focal lesion.  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  IMPRESSION:  1. Negative  for bleed or other acute intracranial process.  2. Atrophy and nonspecific white matter changes   Original Report Authenticated By: D. Andria Rhein, MD    Mr Maxine Glenn Head Wo Contrast  06/10/2012  *RADIOLOGY REPORT*  Clinical Data:  Altered mental status.  Possible cerebral infarct. Found down at home.  Hypertension.  MRI HEAD WITHOUT CONTRAST MRA HEAD WITHOUT CONTRAST  Technique:  Multiplanar, multiecho pulse sequences of the brain and surrounding structures were obtained without intravenous contrast. Angiographic images of the head were obtained using MRA technique without contrast.  Comparison:  CT head 06/09/2012, no prior MRI.  MRI HEAD  Findings:  Subcentimeter acute infarct is identified at the right parietal gray-white junction without associated hemorrhage.  There is marked atrophy with prominence of the ventricles, cisterns and sulci.  Extensive chronic microvascular ischemic changes present in the periventricular and subcortical white matter. Scattered remote lacunar infarcts most notable in the left thalamus and right putamen.   There is a remote left occipital infarct with gliosis and encephalomalacia.  Hydrocephalus ex vacuo is present.  Multiple remote cerebellar infarcts are seen.  Partial empty sella.  No tonsillar herniation. Flow voids are maintained in the major intracranial vasculature appear Dependent fluid in the nasopharynx and sphenoid sinus.  Negative orbits, sinuses, and mastoids for acute process.  IMPRESSION: Subcentimeter solitary acute infarct right parietal gray-white junction without associated hemorrhage. Advanced atrophy with extensive chronic microvascular ischemic change.  MRA HEAD  Findings: The internal carotid arteries are widely patent.  The basilar artery is slightly small, in part due to origin of the right PCA from the carotid.  There is mild nonstenotic irregularity throughout the basilar.  Both vertebrals contribute to its formation with the  left dominant.  Significant  multiple tandem lesions are seen throughout the distal right vertebral likely flow reducing. There is mild nonstenotic irregularity of the proximal middle cerebral arteries.  Moderate to severe stenosis of the proximal right anterior cerebral artery also involve the 08/02 and A3 segments.  The left anterior cerebral artery is dominant.  No proximal PCA stenosis.  No visible cerebellar branch occlusion.  IMPRESSION: Significant disease proximal right anterior cerebral.  Significant disease distal right vertebral.  Mild irregularity throughout the basilar.  No flow reducing carotid stenosis.   Original Report Authenticated By: Davonna Belling, M.D.    Mr Brain Wo Contrast  06/10/2012  *RADIOLOGY REPORT*  Clinical Data:  Altered mental status.  Possible cerebral infarct. Found down at home.  Hypertension.  MRI HEAD WITHOUT CONTRAST MRA HEAD WITHOUT CONTRAST  Technique:  Multiplanar, multiecho pulse sequences of the brain and surrounding structures were obtained without intravenous contrast. Angiographic images of the head were obtained using MRA technique without contrast.  Comparison:  CT head 06/09/2012, no prior MRI.  MRI HEAD  Findings:  Subcentimeter acute infarct is identified at the right parietal gray-white junction without associated hemorrhage.  There is marked atrophy with prominence of the ventricles, cisterns and sulci.  Extensive chronic microvascular ischemic changes present in the periventricular and subcortical white matter. Scattered remote lacunar infarcts most notable in the left thalamus and right putamen.   There is a remote left occipital infarct with gliosis and encephalomalacia.  Hydrocephalus ex vacuo is present.  Multiple remote cerebellar infarcts are seen.  Partial empty sella.  No tonsillar herniation. Flow voids are maintained in the major intracranial vasculature appear Dependent fluid in the nasopharynx and sphenoid sinus.  Negative orbits, sinuses, and mastoids for acute process.   IMPRESSION: Subcentimeter solitary acute infarct right parietal gray-white junction without associated hemorrhage. Advanced atrophy with extensive chronic microvascular ischemic change.  MRA HEAD  Findings: The internal carotid arteries are widely patent.  The basilar artery is slightly small, in part due to origin of the right PCA from the carotid.  There is mild nonstenotic irregularity throughout the  basilar.  Both vertebrals contribute to its formation with the  left dominant.  Significant multiple tandem lesions are seen throughout the distal right vertebral likely flow reducing. There is mild nonstenotic irregularity of the proximal middle cerebral arteries.  Moderate to severe stenosis of the proximal right anterior cerebral artery also involve the 08/02 and A3 segments.  The left anterior cerebral artery is dominant.  No proximal PCA stenosis.  No visible cerebellar branch occlusion.  IMPRESSION: Significant disease proximal right anterior cerebral.  Significant disease distal right vertebral.  Mild irregularity throughout the basilar.  No flow reducing carotid stenosis.   Original Report Authenticated By: Davonna Belling, M.D.    Dg Chest Port 1 View  06/12/2012  *RADIOLOGY REPORT*  Clinical Data: Evaluate atelectasis  PORTABLE CHEST - 1 VIEW  Comparison: 06/10/2012; 06/09/2012; 12/19/2011  Findings: Grossly unchanged enlarged cardiac silhouette and mediastinal contours, possibly accentuated due to persistently reduced lung volumes.  Interval extubation and removal of enteric tube.  Otherwise, stable position remain support apparatus.  No pneumothorax.  Pulmonary venous congestion without frank evidence of edema.  Grossly unchanged perihilar and medial basilar opacities.  No new focal airspace opacities.  No definite pleural effusion.  Unchanged bones.  IMPRESSION: 1.  Interval extubation and removal of enteric tube.  No pneumothorax. 2.  Pulmonary venous congestion without frank evidence of edema.  3.  Grossly unchanged perihilar and medial basilar opacities, atelectasis versus infiltrate.   Original Report Authenticated By: Tacey Ruiz, MD    Dg Chest Port 1 View  06/10/2012  *RADIOLOGY REPORT*  Clinical Data: Assess edema  PORTABLE CHEST - 1 VIEW  Comparison: Chest radiograph 06/09/2012  Findings: Normal cardiac silhouette.  Endotracheal tube, left central venous line, and NG tube are unchanged.  No pulmonary edema.  No pneumothorax.  Mild left basilar atelectasis.  IMPRESSION: Stable support apparatus.  No interval change.   Original Report Authenticated By: Genevive Bi, M.D.    Dg Chest Port 1 View  06/09/2012  *RADIOLOGY REPORT*  Clinical Data: Central line placement  PORTABLE CHEST - 1 VIEW  Comparison:   the previous day's study  Findings: Endotracheal tube has been repositioned, tip 3.3 cm above carina.  Left subclavian central line has been placed to the distal SVC.  No pneumothorax.  Nasogastric tube has been passed at least as far as the stomach, tip not seen.  Heart size upper limits normal as before.  Mildly tortuous ectatic thoracic aorta. Improved left lung aeration with some decrease in the perihilar atelectasis or infiltrates.  No effusion.  IMPRESSION:  1.  Support hardware projecting in expected location as above. 2.  No pneumothorax. 3.  Improved left lung aeration.   Original Report Authenticated By: D. Andria Rhein, MD    Dg Chest Portable 1 View  06/08/2012  *RADIOLOGY REPORT*  Clinical Data: Endotracheal tube position.  PORTABLE CHEST - 1 VIEW  Comparison: 12/19/2011  Findings: Interval placement endotracheal tube with tip in the right mainstem bronchus.  Consider retraction of the tube about 3 cm.  Shallow inspiration.  Slightly increased density in the left lung may represent atelectasis.  Mild cardiac enlargement with normal pulmonary vascularity.  No blunting of costophrenic angles. No pneumothorax.  Degenerative changes in the shoulders.  IMPRESSION: Endotracheal tube tip  is in the right mainstem bronchus.  Results discussed by telephone at 2340 hours on 06/08/2012 with Dr. Jeraldine Loots.   Original Report Authenticated By: Burman Nieves, M.D.     Scheduled Meds:    .  antiseptic oral rinse  1 application Mouth Rinse QID  . aspirin  81 mg Oral Daily  . bimatoprost  1 drop Both Eyes BID  . chlorhexidine  15 mL Mouth/Throat BID  . feeding supplement  30 mL Per Tube 5 X Daily  . feeding supplement (PROMOTE)  1,000 mL Per Tube Q24H  . heparin subcutaneous  5,000 Units Subcutaneous Q8H  . insulin aspart  0-15 Units Subcutaneous TID WC  . labetalol  100 mg Oral BID  . levetiracetam  500 mg Intravenous Q12H  . multivitamin  5 mL Per Tube Daily  . pantoprazole  40 mg Oral QHS   Continuous Infusions:    ________________________________________________________________________  Time spent: 40 min    [facility]  [facility] Pager [phone] If 8PM-8AM, please contact night-coverage at [email or web address], password [facility] 06/13/2012, 5:39 PM  LOS: 5 days

## 2012-06-14 DIAGNOSIS — I6330 Cerebral infarction due to thrombosis of unspecified cerebral artery: Secondary | ICD-10-CM

## 2012-06-14 DIAGNOSIS — F0390 Unspecified dementia without behavioral disturbance: Secondary | ICD-10-CM

## 2012-06-14 LAB — GLUCOSE, CAPILLARY
Glucose-Capillary: 246 mg/dL — ABNORMAL HIGH (ref 70–99)
Glucose-Capillary: 298 mg/dL — ABNORMAL HIGH (ref 70–99)

## 2012-06-14 MED ORDER — LEVETIRACETAM 500 MG PO TABS
500.0000 mg | ORAL_TABLET | Freq: Two times a day (BID) | ORAL | Status: DC
  Administered 2012-06-14 – 2012-06-16 (×4): 500 mg via ORAL
  Filled 2012-06-14 (×5): qty 1

## 2012-06-14 MED ORDER — LABETALOL HCL 5 MG/ML IV SOLN
5.0000 mg | INTRAVENOUS | Status: DC | PRN
  Filled 2012-06-14: qty 4

## 2012-06-14 MED ORDER — ACETAMINOPHEN 325 MG PO TABS: 325.0000 mg | ORAL_TABLET | Freq: Four times a day (QID) | ORAL | Status: DC | PRN

## 2012-06-14 MED ORDER — INSULIN GLARGINE 100 UNIT/ML ~~LOC~~ SOLN
10.0000 [IU] | Freq: Every day | SUBCUTANEOUS | Status: DC
  Administered 2012-06-14 – 2012-06-15 (×2): 10 [IU] via SUBCUTANEOUS

## 2012-06-14 MED ORDER — DONEPEZIL HCL 10 MG PO TABS
10.0000 mg | ORAL_TABLET | Freq: Every day | ORAL | Status: DC
  Administered 2012-06-14 – 2012-06-15 (×2): 10 mg via ORAL
  Filled 2012-06-14 (×3): qty 1

## 2012-06-14 MED ORDER — PREGABALIN 50 MG PO CAPS
50.0000 mg | ORAL_CAPSULE | Freq: Three times a day (TID) | ORAL | Status: DC
  Administered 2012-06-14 – 2012-06-16 (×6): 50 mg via ORAL
  Filled 2012-06-14 (×6): qty 1

## 2012-06-14 MED ORDER — BENAZEPRIL HCL 5 MG PO TABS
5.0000 mg | ORAL_TABLET | Freq: Every day | ORAL | Status: DC
  Administered 2012-06-14 – 2012-06-16 (×3): 5 mg via ORAL
  Filled 2012-06-14 (×3): qty 1

## 2012-06-14 NOTE — Progress Notes (Signed)
Physical Therapy Treatment Patient Details Name: Glenda Gonzalez MRN: [id number] DOB: December 04, 1933 Today's Date: 06/14/2012 Time: 6295-2841 PT Time Calculation (min): 23 min  PT Assessment / Plan / Recommendation Comments on Treatment Session  Pt able to improve overall bed mobility, transfers and able to ambulation this session however continues to be slow to process and respond to questions asked with slow motor planning response.  Noticeable right sided resting tremors right LE > UE this session.  Continue to recommend inpatient rehab.    Follow Up Recommendations  CIR     Equipment Recommendations  Rolling walker with 5" wheels    Recommendations for Other Services Rehab consult  Frequency Min 3X/week   Plan Discharge plan remains appropriate;Frequency remains appropriate    Precautions / Restrictions Precautions Precautions: Fall   Pertinent Vitals/Pain No c/o pain; increase SOB with ambulation 2-3/4 however vitals maintained throughout session    Mobility  Bed Mobility Bed Mobility: Supine to Sit;Sitting - Scoot to Edge of Bed Supine to Sit: 3: Mod assist Sitting - Scoot to Edge of Bed: 3: Mod assist Details for Bed Mobility Assistance: (A) to elevate trunk OOB and advance LE to EOB with max cues for technique Transfers Transfers: Sit to Stand;Stand to Sit Sit to Stand: 1: +2 Total assist;From bed Sit to Stand: Patient Percentage: 60% Stand to Sit: 1: +2 Total assist;To chair/3-in-1 Stand to Sit: Patient Percentage: 60% Details for Transfer Assistance: +2 (A) to initiate transfer and slowly descend to recliner with cues for hand placement.  Pt needs extra time to complete task and very slow to process and overall motor planning. Ambulation/Gait Ambulation/Gait Assistance: 1: +2 Total assist Ambulation/Gait: Patient Percentage: 70% Ambulation Distance (Feet): 6 Feet Assistive device: Rolling walker Ambulation/Gait Assistance Details: Pt very slow to ambulate and  needs extra time to complete task.  Pt with step to gait and shuffle steps.  Needs Max cues for upright posture, encourage increase step length and body position within RW. Gait Pattern: Step-to pattern;Decreased stride length;Shuffle;Trunk flexed Gait velocity:  very slow cadence due to questionable motoro planning    Exercises General Exercises - Lower Extremity Long Arc Quad: Strengthening;Both;5 reps Hip Flexion/Marching: Strengthening;Both;5 reps   PT Diagnosis:    PT Problem List:   PT Treatment Interventions:     PT Goals Acute Rehab PT Goals PT Goal Formulation: With patient Time For Goal Achievement: 06/26/12 Potential to Achieve Goals: Good Pt will go Supine/Side to Sit: with supervision PT Goal: Supine/Side to Sit - Progress: Progressing toward goal Pt will Sit at Edge of Bed: with modified independence;1-2 min PT Goal: Sit at Edge Of Bed - Progress: Progressing toward goal Pt will go Sit to Supine/Side: with min assist PT Goal: Sit to Supine/Side - Progress: Progressing toward goal Pt will go Sit to Stand: with min assist PT Goal: Sit to Stand - Progress: Progressing toward goal Pt will go Stand to Sit: with min assist PT Goal: Stand to Sit - Progress: Progressing toward goal Pt will Transfer Bed to Chair/Chair to Bed: with min assist PT Transfer Goal: Bed to Chair/Chair to Bed - Progress: Progressing toward goal Pt will Stand: with supervision;1 - 2 min PT Goal: Stand - Progress: Progressing toward goal Pt will Ambulate: 16 - 50 feet;with min assist;with rolling walker PT Goal: Ambulate - Progress: Progressing toward goal  Visit Information  Last PT Received On: 06/14/12 Assistance Needed: +2    Subjective Data  Subjective: "I'm feeling better today." Patient Stated Goal: To return  home with husband   Cognition  Overall Cognitive Status: Impaired Area of Impairment: Problem solving Arousal/Alertness: Awake/alert Orientation Level: Oriented X4 /  Intact Behavior During Session: WFL for tasks performed Problem Solving: Pt very slow to process and takes ~3 seconds to respond to questions asked.    Balance  Balance Balance Assessed: Yes Static Sitting Balance Static Sitting - Balance Support: Feet supported Static Sitting - Level of Assistance: 5: Stand by assistance Static Standing Balance Static Standing - Balance Support: Bilateral upper extremity supported Static Standing - Level of Assistance: 4: Min assist Static Standing - Comment/# of Minutes: (A) to maintain balance and prevent posterior lean  End of Session PT - End of Session Equipment Utilized During Treatment: Gait belt Activity Tolerance: Patient limited by fatigue Patient left: in chair;with call bell/phone within reach;with family/visitor present Nurse Communication: Mobility status   GP     Onetta Spainhower 06/14/2012, 8:25 AM Jake Shark, PT DPT [phone]

## 2012-06-14 NOTE — Progress Notes (Signed)
Foley cath d/c'd. Pt tolerated procedure well. Will continue to monitor.

## 2012-06-14 NOTE — Progress Notes (Signed)
Inpatient Diabetes Program Recommendations  AACE/ADA: New Consensus Statement on Inpatient Glycemic Control (2013)  Target Ranges:  Prepandial:   less than 140 mg/dL      Peak postprandial:   less than 180 mg/dL (1-2 hours)      Critically ill patients:  140 - 180 mg/dL   Reason for Visit: Wide swings in glucose levels  Inpatient Diabetes Program Recommendations Insulin - Basal: May benefit from addition of low dose Lantus 5 to 10 units daily.    Correction (SSI): Currently on moderate Novolog correction scale tid with meals. Insulin - Meal Coverage: May benefit from addition of Novolog meal coverage 3 units tid with meals with titration as necessary.  Should get meal coverage when eating at least 50% of meal and should be given in addition to correction scale. Oral Agents: On oral agents prior to admission.  Insulin is preferred for the management of glucose in the hospital setting. Diet: Dysphagia 2, CHO modified medium  Note:  Results for KAILEY, ESQUILIN (MRN [id number]) as of 06/14/2012 10:15  Ref. Range 06/13/2012 08:49 06/13/2012 11:30 06/13/2012 17:16 06/13/2012 22:10 06/14/2012 07:57  Glucose-Capillary Latest Range: 70-99 mg/dL 130 (H) 865 (H) 784 (H) 300 (H) 189 (H)   Wide swings in glucose increase the risk of hypoglycemia.  Recommend gentle addition of scheduled insulin.  Thank you.  Marques Ericson S. Elsie Lincoln, RN, CNS, CDE Inpatient Diabetes Program, team pager [phone]

## 2012-06-14 NOTE — Progress Notes (Signed)
Pt was transferred to [id number], per MD order. Report was called to receiving nurse and all questions answered.

## 2012-06-14 NOTE — Progress Notes (Signed)
NUTRITION FOLLOW UP  Intervention:    Recommend advance diet to CHO-modified medium calorie (okayed by SLP to have regular diet with thin liquids).  Supplements not needed at this time, intake appears adequate.  Nutrition Dx:   Inadequate oral intake related to inability to eat as evidenced by NPO status, resolved.  New Goal:   Intake to meet >90% of estimated nutrition needs, met.  Monitor:   PO intake, weight trend.  Assessment:   TF has been off since extubation on 1/16.  SLP following for dysphagia.  Currently on Dysphagia 2 diet with nectar thick liquids.  Eating very well, consuming ~75% of meals and drinking fluids well per RN.  SLP recommends upgrading diet to regular with thin liquids.    Height: Ht Readings from Last 1 Encounters:  06/09/12 5' (1.524 m)    Weight Status:   Wt Readings from Last 1 Encounters:  06/14/12 171 lb 11.8 oz (77.9 kg)  1/15 78.4 kg  Re-estimated needs:  Kcal: 1300-1500 Protein: 70-80 gm Fluid: 1.4-1.6 L  Skin: intact  Diet Order: Dysphagia 2 with nectar thick liquids   Intake/Output Summary (Last 24 hours) at 06/14/12 1425 Last data filed at 06/14/12 1139  Gross per 24 hour  Intake    360 ml  Output   1400 ml  Net  -1040 ml    Last BM: 1/18   Labs:   Lab 06/13/12 0339 06/12/12 0400 06/11/12 0545 06/09/12 1030  NA 141 143 144 --  K 3.9 3.3* 2.9* --  CL 107 107 105 --  CO2 23 25 26  --  BUN 25* 19 26* --  CREATININE 1.02 0.91 0.97 --  CALCIUM 8.9 9.2 8.9 --  MG 2.2 1.5 -- 1.6  PHOS -- 2.9 -- 3.1  GLUCOSE 267* 99 128* --    CBG (last 3)   Basename 06/14/12 1144 06/14/12 0757 06/13/12 2210  GLUCAP 246* 189* 300*    Scheduled Meds:   . antiseptic oral rinse  1 application Mouth Rinse QID  . aspirin  81 mg Oral Daily  . bimatoprost  1 drop Both Eyes BID  . chlorhexidine  15 mL Mouth/Throat BID  . feeding supplement  30 mL Per Tube 5 X Daily  . heparin subcutaneous  5,000 Units Subcutaneous Q8H  . insulin  aspart  0-15 Units Subcutaneous TID WC  . labetalol  100 mg Oral BID  . levETIRAcetam  500 mg Oral BID  . multivitamin  5 mL Per Tube Daily  . pantoprazole  40 mg Oral QHS    Continuous Infusions:   Joaquin Courts, RD, LDN, CNSC Pager# [phone] After Hours Pager# [phone]

## 2012-06-14 NOTE — Progress Notes (Signed)
Utilization review completed.  

## 2012-06-14 NOTE — Progress Notes (Signed)
[facility] Progress Note [place] TEAM 1 - Stepdown/ICU TEAM   AHLEAH SIMKO ZOX:[id number] DOB: 24-Jun-1933 DOA: 06/08/2012 PCP: Billee Cashing, MD  Brief narrative: 79 y/o female found by family unresponsive with foaming at the mouth - noted to have clonic activity of lips and feet- suspected to be seizing - failed to improve with Ativan - intubated by EDP for respiratory distress on 06/08/12 - started on Propofol - also was severely hypertensive.   SIGNIFICANT EVENTS:  1/14 >>> Found down, frothing from her mouth, with possible seizure activity, hypertensive, intubated for airway protection.  1/14 ct head - neg acute process  1/15 eeg - neg focus  1/15 MRI brain>>>Subcentimeter solitary acute infarct right parietal gray-white<BR>junction without associated hemorrhage. Advanced atrophy with<BR>extensive chronic microvascular ischemic change  1/16 extubated  Assessment/Plan:  Status epilepticus / Acute encephalopathy / Acute respiratory failure  On Keppra - stable - lacunar infarct seen on CT head but per Neuro it likely did not cause the seizures but resulted from HTN on admission  Lacunar CVA Has been seen by Neuro - felt to be due to malignant HTN at admit  DM  CBGs erratic - does not appear to be on meds as outpt - check A1c - adjust tx plan - is on low dose ASA - add ACE   HYPERTENSION  not on hypertensives at home - BP remains elevated over baseline - adjust meds further today and follow trend - add ACE and follow renal fxn  CONGESTIVE HEART FAILURE???  There was a suspicion of pulm edema on admission- ECHO did not reveal any significant pathology - EF 50-55%, mild LVH and mild elevation of pulm pressures   Hypokalemia  Replaced - normalized  Dementia Cont outpt meds  Hx of CAD  Code Status: FULL Family Communication: no family at bedside Disposition Plan: transfer to med bed - hopeful for CIR admission  Consultants: Neuro PCCM  Procedures: OETT  1/14 >>>1/16  Antibiotics: none  DVT prophylaxis: Sub Q heparin   HPI/Subjective: Pt is resting comfortably.  No family present at the time of my visit.    Objective: Blood pressure 157/68, pulse 63, temperature 98.8 F (37.1 C), temperature source Oral, resp. rate 19, height 5' (1.524 m), weight 77.9 kg (171 lb 11.8 oz), SpO2 97.00%.  Intake/Output Summary (Last 24 hours) at 06/14/12 1513 Last data filed at 06/14/12 1139  Gross per 24 hour  Intake    360 ml  Output   1400 ml  Net  -1040 ml     Exam: General: No acute respiratory distress Lungs: Clear to auscultation bilaterally without wheezes or crackles Cardiovascular: Regular rate and rhythm without murmur gallop or rub normal S1 and S2 Abdomen: Nontender, nondistended, soft, bowel sounds positive, no rebound, no ascites, no appreciable mass Extremities: No significant cyanosis, clubbing, or edema bilateral lower extremities  Data Reviewed: Basic Metabolic Panel:  Lab 06/13/12 8119 06/12/12 0400 06/11/12 0545 06/10/12 0450 06/09/12 1030 06/09/12 0028  NA 141 143 144 142 140 --  K 3.9 3.3* 2.9* 3.2* 3.1* --  CL 107 107 105 105 100 --  CO2 23 25 26 26 28  --  GLUCOSE 267* 99 128* 196* 172* --  BUN 25* 19 26* 29* 15 --  CREATININE 1.02 0.91 0.97 1.15* 1.07 --  CALCIUM 8.9 9.2 8.9 8.2* 9.0 --  MG 2.2 1.5 -- -- 1.6 1.8  PHOS -- 2.9 -- -- 3.1 --   Liver Function Tests:  Lab 06/10/12 0450 06/08/12 2309  AST 19 60*  ALT 12 25  ALKPHOS 165* 220*  BILITOT 0.2* 0.3  PROT 6.1 8.5*  ALBUMIN 2.5* 3.3*   CBC:  Lab 06/13/12 0339 06/10/12 0450 06/08/12 2309  WBC 6.7 7.3 9.1  NEUTROABS -- 4.6 5.5  HGB 9.3* 9.8* 12.2  HCT 29.4* 30.8* 36.8  MCV 83.8 83.7 82.1  PLT 199 206 251   Cardiac Enzymes:  Lab 06/09/12 1027 06/09/12 0530 06/09/12 0028  CKTOTAL -- -- --  CKMB -- -- --  CKMBINDEX -- -- --  TROPONINI <0.30 <0.30 <0.30   CBG:  Lab 06/14/12 1144 06/14/12 0757 06/13/12 2210 06/13/12 1716 06/13/12 1130    GLUCAP 246* 189* 300* 135* 392*    Recent Results (from the past 240 hour(s))  URINE CULTURE     Status: Normal   Collection Time   06/08/12 11:37 PM      Component Value Range Status Comment   Specimen Description URINE, CATHETERIZED   Final    Special Requests NONE   Final    Culture  Setup Time 06/09/2012 00:17   Final    Colony Count NO GROWTH   Final    Culture NO GROWTH   Final    Report Status 06/10/2012 FINAL   Final   CULTURE, BLOOD (ROUTINE X 2)     Status: Normal (Preliminary result)   Collection Time   06/08/12 11:48 PM      Component Value Range Status Comment   Specimen Description BLOOD RIGHT HAND   Final    Special Requests BOTTLES DRAWN AEROBIC ONLY 6CC   Final    Culture  Setup Time 06/09/2012 08:47   Final    Culture     Final    Value:        BLOOD CULTURE RECEIVED NO GROWTH TO DATE CULTURE WILL BE HELD FOR 5 DAYS BEFORE ISSUING A FINAL NEGATIVE REPORT   Report Status PENDING   Incomplete   CULTURE, BLOOD (ROUTINE X 2)     Status: Normal   Collection Time   06/08/12 11:50 PM      Component Value Range Status Comment   Specimen Description BLOOD ARM LEFT   Final    Special Requests BOTTLES DRAWN AEROBIC AND ANAEROBIC 10CC   Final    Culture  Setup Time 06/09/2012 06:15   Final    Culture     Final    Value: DIPHTHEROIDS(CORYNEBACTERIUM SPECIES)     Note: Standardized susceptibility testing for this organism is not available.     Note: Gram Stain Report Called to,Read Back By and Verified With: GADE SANTANELLA 06/11/12 1440 BY SMITHERJ   Report Status 06/13/2012 FINAL   Final   MRSA PCR SCREENING     Status: Normal   Collection Time   06/09/12  2:36 AM      Component Value Range Status Comment   MRSA by PCR NEGATIVE  NEGATIVE Final      Studies:  Recent x-ray studies have been reviewed in detail by the Attending Physician  Scheduled Meds:  Reviewed in detail by the Attending Physician   Lonia Blood, MD [facility] Office   [phone] Pager [phone]  On-Call/Text Page:      [email or web address]      password [email or web address]  If 7PM-7AM, please contact night-coverage [email or web address] Password [email or web address] 06/14/2012, 3:13 PM   LOS: 6 days

## 2012-06-14 NOTE — Progress Notes (Signed)
Speech Language Pathology Dysphagia Treatment Patient Details Name: Glenda Gonzalez MRN: [id number] DOB: [birth year] Today's Date: 06/14/2012 Time: 1478-2956 SLP Time Calculation (min): 20 min  Assessment / Plan / Recommendation Clinical Impression  F/u after 1/18 clinical swallow eval.  Pt presents with improved function/resolution of dysphagia.  Demonstrated efficient mastication of regular solids despite absence of dentition; swift swallow trigger;  no s/s of compromised airway protection.  Pt alert, oriented to situation, communicative.  Recommend advancing diet to regular solids and thin liquids; meds whole with liquids.  No further SLP warranted.    Diet Recommendation  Initiate / Change Diet: Regular;Thin liquid    SLP Plan All goals met   Pertinent Vitals/Pain No pain  Swallowing Goals  SLP Swallowing Goals Patient will consume recommended diet without observed clinical signs of aspiration with: Supervision/safety Swallow Study Goal #1 - Progress: Met Patient will utilize recommended strategies during swallow to increase swallowing safety with: Supervision/safety Swallow Study Goal #2 - Progress: Met  General Temperature Spikes Noted: No Respiratory Status: Room air Behavior/Cognition: Alert;Cooperative;Pleasant mood Oral Cavity - Dentition: Edentulous Patient Positioning: Upright in bed  Oral Cavity - Oral Hygiene Does patient have any of the following "at risk" factors?: Diet - patient on thickened liquids Brush patient's teeth BID with toothbrush (using toothpaste with fluoride): Yes   Dysphagia Treatment Treatment focused on: Skilled observation of diet tolerance;Upgraded PO texture trials;Patient/family/caregiver education Treatment Methods/Modalities: Skilled observation;Differential diagnosis Patient observed directly with PO's: Yes Type of PO's observed: Regular;Thin liquids;Dysphagia 1 (puree) Feeding: Able to feed self;Needs assist Liquids provided via:  Cup Type of cueing:  (no cueing needed)   GO   Nazar Kuan L. Samson Frederic, [place] CCC/SLP Pager [phone]   [name] 06/14/2012, 1:03 PM

## 2012-06-14 NOTE — Consult Note (Signed)
Physical Medicine and Rehabilitation Consult Reason for Consult: Status epilepticus/acute encephalopathy/respiratory failure Referring Physician: [facility]   HPI: Glenda Gonzalez is a 79 y.o. right-handed female with history of diabetes mellitus with peripheral neuropathy, dementia, coronary artery disease. Admitted 06/08/2012 after being found down questionable seizure frothing from the mouth. MRI of the brain showed subcentimeter solitary acute infarct right parietal gray matter junction without associated hemorrhage as was advanced atrophy. 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 Echocardiogram with ejection fraction of 55% and normal systolic function. Carotid Dopplers with right 40-59% ICA stenosis. EEG showed no seizure activity. Neurology consulted patient was loaded with Keppra for seizure. Aspirin was added for CVA prophylaxis. Subcutaneous heparin for DVT prophylaxis. Patient did require intubation for airway protection and was extubated 06/10/2012 without difficulty. Diet has slowly been advanced to a dysphagia 2 nectar thick liquids per speech therapy as well as noted patient being slow to process needing extra time to complete tasks. Physical therapy evaluation completed 06/12/2012 with recommendations for physical medicine rehabilitation consult to consider inpatient rehabilitation services   Review of Systems  Cardiovascular: Positive for leg swelling.  Gastrointestinal:       Reflux  Musculoskeletal: Positive for joint pain.  Neurological: Positive for seizures and weakness.  All other systems reviewed and are negative.   Past Medical History  Diagnosis Date  . Diabetes mellitus   . GERD (gastroesophageal reflux disease)   . Hypertension   . Diverticulitis   . Gout   . Essential and other specified forms of tremor   . Dementia   . CAD (coronary artery disease)   . CHF (congestive heart failure)     Diastolic  dysfunction   Past Surgical History  Procedure Date  . Dilation and curettage of uterus   . Tumor removal    Family History  Problem Relation Age of Onset  . Colon cancer Neg Hx   . Heart disease Father   . Heart disease Brother    Social History:  reports that she has never smoked. She has never used smokeless tobacco. She reports that she does not drink alcohol or use illicit drugs. Allergies:  Allergies  Allergen Reactions  . Codeine Other (See Comments)    hallucination  . Fentanyl Other (See Comments)    Abnormal behavior per husband  . Morphine Itching  . Penicillins Itching   Medications Prior to Admission  Medication Sig Dispense Refill  . acetaminophen (TYLENOL) 325 MG tablet Take 325-650 mg by mouth every 6 (six) hours as needed. Headache or pain      . bimatoprost (LUMIGAN) 0.03 % ophthalmic solution Place 1 drop into both eyes 2 (two) times daily.       Marland Kitchen donepezil (ARICEPT) 10 MG tablet Take 10 mg by mouth at bedtime.       Marland Kitchen esomeprazole (NEXIUM) 40 MG capsule Take 40 mg by mouth 2 (two) times daily before a meal.      . furosemide (LASIX) 20 MG tablet Take 20 mg by mouth daily.      Marland Kitchen loperamide (IMODIUM) 2 MG capsule Take 2 mg by mouth daily as needed. For diarrhea      . pregabalin (LYRICA) 50 MG capsule Take 50 mg by mouth 3 (three) times daily.        Bertram Gala Glycol-Propyl Glycol (SYSTANE ULTRA OP) Place 1 drop into both eyes daily as needed. For dry eyes  Home: Home Living Lives With: Spouse;Son Available Help at Discharge: Family (son works during the day) Type of Home: House Home Access: Ramped entrance Home Layout: One level Bathroom Shower/Tub:  (takes sponge bath) Bathroom Toilet: Standard Bathroom Accessibility: No Home Adaptive Equipment: Bedside commode/3-in-1;Walker - rolling;Straight cane  Functional History: Prior Function Bath: Moderate Toileting: Maximal Meal Prep: Unable to assess Light Housekeeping: Unable to  assess Functional Status:  Mobility: Bed Mobility Bed Mobility: Supine to Sit;Sitting - Scoot to Edge of Bed Supine to Sit: 3: Mod assist Sitting - Scoot to Edge of Bed: 3: Mod assist Transfers Transfers: Sit to Stand;Stand to Sit Sit to Stand: 1: +2 Total assist;From bed Sit to Stand: Patient Percentage: 60% Stand to Sit: 1: +2 Total assist;To chair/3-in-1 Stand to Sit: Patient Percentage: 60% Stand Pivot Transfers: 1: +2 Total assist Stand Pivot Transfers: Patient Percentage: 50% Ambulation/Gait Ambulation/Gait Assistance: 1: +2 Total assist Ambulation/Gait: Patient Percentage: 70% Ambulation Distance (Feet): 6 Feet Assistive device: Rolling walker Ambulation/Gait Assistance Details: Pt very slow to ambulate and needs extra time to complete task.  Pt with step to gait and shuffle steps.  Needs Max cues for upright posture, encourage increase step length and body position within RW. Gait Pattern: Step-to pattern;Decreased stride length;Shuffle;Trunk flexed Gait velocity:  very slow cadence due to questionable motoro planning    ADL:    Cognition: Cognition Arousal/Alertness: Awake/alert Orientation Level: Oriented to person;Oriented to place;Oriented to time;Disoriented to situation Cognition Overall Cognitive Status: Impaired Area of Impairment: Problem solving Arousal/Alertness: Awake/alert Orientation Level: Oriented X4 / Intact Behavior During Session: WFL for tasks performed Problem Solving: Pt very slow to process and takes ~3 seconds to respond to questions asked.  Blood pressure 157/68, pulse 63, temperature 98.8 F (37.1 C), temperature source Oral, resp. rate 19, height 5' (1.524 m), weight 77.9 kg (171 lb 11.8 oz), SpO2 97.00%. Physical Exam  Vitals reviewed. HENT:  Head: Normocephalic.  Eyes:       Pupils reactive to light without nystagmus  Neck: Neck supple. No thyromegaly present.  Cardiovascular: Normal rate and regular rhythm.   Pulmonary/Chest:        Decreased breath sounds with poor inspiratory effort but clear to auscultation  Abdominal: Soft. Bowel sounds are normal. She exhibits no distension.  Neurological: She is alert.       Patient made good eye contact with examiner. She is slow to process with delay. She was able to give her name as well as place but had difficulty with age. She would follow one-step commands for me but easily became frustrated. Slower with left hand. Intentional tremor bilaterally. Answered biographical questions but with deliberation. Strength 3/5 UE and 2 prox LE to 3/4 distally. No gross sensory deficits although both legs were hypersensitive to touch.  Skin: Skin is warm and dry.    Results for orders placed during the hospital encounter of 06/08/12 (from the past 24 hour(s))  GLUCOSE, CAPILLARY     Status: Abnormal   Collection Time   06/13/12  5:16 PM      Component Value Range   Glucose-Capillary 135 (*) 70 - 99 mg/dL  GLUCOSE, CAPILLARY     Status: Abnormal   Collection Time   06/13/12 10:10 PM      Component Value Range   Glucose-Capillary 300 (*) 70 - 99 mg/dL   Comment 1 Notify RN    GLUCOSE, CAPILLARY     Status: Abnormal   Collection Time   06/14/12  7:57 AM  Component Value Range   Glucose-Capillary 189 (*) 70 - 99 mg/dL   Comment 1 Documented in Chart     Comment 2 Notify RN    GLUCOSE, CAPILLARY     Status: Abnormal   Collection Time   06/14/12 11:44 AM      Component Value Range   Glucose-Capillary 246 (*) 70 - 99 mg/dL   Comment 1 Documented in Chart     Comment 2 Notify RN     No results found.  Assessment/Plan: Diagnosis: right parietal infarct, seizure 1. Does the need for close, 24 hr/day medical supervision in concert with the patient's rehab needs make it unreasonable for this patient to be served in a less intensive setting? Yes 2. Co-Morbidities requiring supervision/potential complications: dm, htn, chf, dementia 3. Due to bladder management, bowel management,  safety, skin/wound care, disease management, medication administration, pain management and patient education, does the patient require 24 hr/day rehab nursing? Yes 4. Does the patient require coordinated care of a physician, rehab nurse, PT (1-2 hrs/day, 5 days/week), OT (1-2 hrs/day, 5 days/week) and SLP (1-2 hrs/day, 5 days/week) to address physical and functional deficits in the context of the above medical diagnosis(es)? Yes Addressing deficits in the following areas: balance, endurance, locomotion, strength, transferring, bowel/bladder control, bathing, dressing, feeding, grooming, toileting, cognition, speech and psychosocial support 5. Can the patient actively participate in an intensive therapy program of at least 3 hrs of therapy per day at least 5 days per week? Potentially 6. The potential for patient to make measurable gains while on inpatient rehab is good 7. Anticipated functional outcomes upon discharge from inpatient rehab are supervision to min assist with extra time with PT, supervision tomin assist with OT, supervision  with SLP. 8. Estimated rehab length of stay to reach the above functional goals is: 2 weeks 9. Does the patient have adequate social supports to accommodate these discharge functional goals? Yes 10. Anticipated D/C setting: Home 11. Anticipated post D/C treatments: HH therapy 12. Overall Rehab/Functional Prognosis: good  RECOMMENDATIONS: This patient's condition is appropriate for continued rehabilitative care in the following setting: CIR Patient has agreed to participate in recommended program. Yes Note that insurance prior authorization may be required for reimbursement for recommended care.  Comment:Pt seems to have reasonable social supports and wants to participate in our program. Will follow up with family to confirm ability to assist.     Ivory Broad, MD     06/14/2012

## 2012-06-15 LAB — CULTURE, BLOOD (ROUTINE X 2): Culture: NO GROWTH

## 2012-06-15 LAB — GLUCOSE, CAPILLARY: Glucose-Capillary: 254 mg/dL — ABNORMAL HIGH (ref 70–99)

## 2012-06-15 MED ORDER — SODIUM CHLORIDE 0.9 % IJ SOLN
10.0000 mL | INTRAMUSCULAR | Status: DC | PRN
  Administered 2012-06-15: 30 mL
  Administered 2012-06-15 – 2012-06-16 (×2): 10 mL

## 2012-06-15 NOTE — Progress Notes (Signed)
Pt has received approval from insurance, but, CIR beds have filled. Will follow up tomorrow to see if CIR admit possible then.  [phone]

## 2012-06-15 NOTE — Progress Notes (Signed)
[facility] PROGRESS NOTE  Glenda Gonzalez WUJ:[id number] DOB: 06/21/1933 DOA: 06/08/2012 PCP: Billee Cashing, MD  Assessment/Plan: Status epilepticus / Acute encephalopathy / Acute respiratory failure  On Keppra - stable - No further seizure activity. Lacunar infarct seen on CT head but per Neuro it likely did not cause the seizures but resulted from HTN on admission  -continue Keppra  Lacunar CVA  Has been seen by Neuro - felt to be due to malignant HTN at admit   DM  CBGs poor control - does not appear to be on meds as outpt -  A1c pending -appreciate diabetes coordinator input. Lantus 10u started last night. Continue moderate SSI. If no improvement and pt eating enough will add meal coverage   - is on low dose ASA - add ACE   HYPERTENSION  not on hypertensives at home  - BP remains elevated over baseline SBP ranger 148-180/ -  ACE  Added 1/20. Continue BB follow renal fxn   CONGESTIVE HEART FAILURE???  There was a suspicion of pulm edema on admission- ECHO did not reveal any significant pathology  - EF 50-55%, mild LVH and mild elevation of pulm pressures -volume status -1.7L  -documented weight stable at 77.5kg -today documented weight 82.4kg. Suspect difference attributable to transfer to floor and different scale. Does not appear overloaded. Will monitor.   Hypokalemia  Replaced - normalized   Dementia  Cont outpt meds.    Hx of CAD  No complaints CP. On ASA  Code Status: full Family Communication:  Disposition Plan: likely need snf for rehab. PT recommending CIR when ready   Consultants:  Neuro  PCCM  Procedures:  OETT 1/14>>>1/16  Antibiotics:  none  HPI/Subjective: Awake alert oriented to self and place. Denies pain. Reports anxiety at not being able to remember events leading to hospitalization.   Objective: Filed Vitals:   06/14/12 1641 06/14/12 1835 06/14/12 2107 06/15/12 0514  BP: 148/72 166/82 180/82 149/73  Pulse: 62 63 67 69   Temp: 98.6 F (37 C) 98.1 F (36.7 C) 98.7 F (37.1 C) 98.2 F (36.8 C)  TempSrc: Oral Oral Oral Oral  Resp: 18 20 20 20   Height:      Weight:    82.4 kg (181 lb 10.5 oz)  SpO2: 97% 99% 96% 94%    Intake/Output Summary (Last 24 hours) at 06/15/12 0748 Last data filed at 06/15/12 9562  Gross per 24 hour  Intake    600 ml  Output    570 ml  Net     30 ml   Filed Weights   06/14/12 0407 06/14/12 0500 06/15/12 0514  Weight: 77.9 kg (171 lb 11.8 oz) 77.9 kg (171 lb 11.8 oz) 82.4 kg (181 lb 10.5 oz)    Exam:   General:  Obese, pleasant NAD  Cardiovascular: RRR no MGR trace LEE PPP  Respiratory: normal effort BSCTAB no wheeze/rhonchi  Abdomen: soft +BS non-tender to palpation  Data Reviewed: Basic Metabolic Panel:  Lab 06/13/12 1308 06/12/12 0400 06/11/12 0545 06/10/12 0450 06/09/12 1030 06/09/12 0028  NA 141 143 144 142 140 --  K 3.9 3.3* 2.9* 3.2* 3.1* --  CL 107 107 105 105 100 --  CO2 23 25 26 26 28  --  GLUCOSE 267* 99 128* 196* 172* --  BUN 25* 19 26* 29* 15 --  CREATININE 1.02 0.91 0.97 1.15* 1.07 --  CALCIUM 8.9 9.2 8.9 8.2* 9.0 --  MG 2.2 1.5 -- -- 1.6 1.8  PHOS -- 2.9 -- --  3.1 --   Liver Function Tests:  Lab 06/10/12 0450 06/08/12 2309  AST 19 60*  ALT 12 25  ALKPHOS 165* 220*  BILITOT 0.2* 0.3  PROT 6.1 8.5*  ALBUMIN 2.5* 3.3*   No results found for this basename: LIPASE:5,AMYLASE:5 in the last 168 hours No results found for this basename: AMMONIA:5 in the last 168 hours CBC:  Lab 06/13/12 0339 06/10/12 0450 06/08/12 2309  WBC 6.7 7.3 9.1  NEUTROABS -- 4.6 5.5  HGB 9.3* 9.8* 12.2  HCT 29.4* 30.8* 36.8  MCV 83.8 83.7 82.1  PLT 199 206 251   Cardiac Enzymes:  Lab 06/09/12 1027 06/09/12 0530 06/09/12 0028  CKTOTAL -- -- --  CKMB -- -- --  CKMBINDEX -- -- --  TROPONINI <0.30 <0.30 <0.30   BNP (last 3 results)  Basename 06/08/12 2309  PROBNP 234.3   CBG:  Lab 06/14/12 2119 06/14/12 1647 06/14/12 1144 06/14/12 0757 06/13/12 2210    GLUCAP 203* 298* 246* 189* 300*    Recent Results (from the past 240 hour(s))  URINE CULTURE     Status: Normal   Collection Time   06/08/12 11:37 PM      Component Value Range Status Comment   Specimen Description URINE, CATHETERIZED   Final    Special Requests NONE   Final    Culture  Setup Time 06/09/2012 00:17   Final    Colony Count NO GROWTH   Final    Culture NO GROWTH   Final    Report Status 06/10/2012 FINAL   Final   CULTURE, BLOOD (ROUTINE X 2)     Status: Normal (Preliminary result)   Collection Time   06/08/12 11:48 PM      Component Value Range Status Comment   Specimen Description BLOOD RIGHT HAND   Final    Special Requests BOTTLES DRAWN AEROBIC ONLY 6CC   Final    Culture  Setup Time 06/09/2012 08:47   Final    Culture     Final    Value:        BLOOD CULTURE RECEIVED NO GROWTH TO DATE CULTURE WILL BE HELD FOR 5 DAYS BEFORE ISSUING A FINAL NEGATIVE REPORT   Report Status PENDING   Incomplete   CULTURE, BLOOD (ROUTINE X 2)     Status: Normal   Collection Time   06/08/12 11:50 PM      Component Value Range Status Comment   Specimen Description BLOOD ARM LEFT   Final    Special Requests BOTTLES DRAWN AEROBIC AND ANAEROBIC 10CC   Final    Culture  Setup Time 06/09/2012 06:15   Final    Culture     Final    Value: DIPHTHEROIDS(CORYNEBACTERIUM SPECIES)     Note: Standardized susceptibility testing for this organism is not available.     Note: Gram Stain Report Called to,Read Back By and Verified With: GADE SANTANELLA 06/11/12 1440 BY SMITHERJ   Report Status 06/13/2012 FINAL   Final   MRSA PCR SCREENING     Status: Normal   Collection Time   06/09/12  2:36 AM      Component Value Range Status Comment   MRSA by PCR NEGATIVE  NEGATIVE Final      Studies: No results found.  Scheduled Meds:   . aspirin  81 mg Oral Daily  . benazepril  5 mg Oral Daily  . bimatoprost  1 drop Both Eyes BID  . donepezil  10 mg Oral QHS  .  heparin subcutaneous  5,000 Units  Subcutaneous Q8H  . insulin aspart  0-15 Units Subcutaneous TID WC  . insulin glargine  10 Units Subcutaneous QHS  . labetalol  100 mg Oral BID  . levETIRAcetam  500 mg Oral BID  . multivitamin  5 mL Per Tube Daily  . pantoprazole  40 mg Oral QHS  . pregabalin  50 mg Oral TID   Continuous Infusions:   Principal Problem:  *Status epilepticus Active Problems:  DM  HYPERTENSION  CONGESTIVE HEART FAILURE UNSPECIFIED  Acute encephalopathy  Acute respiratory failure  Hyperglycemia    Time spent: 30 minutes    [facility] M  [facility]  If 8PM-8AM, please contact night-coverage at [email or web address], password [facility] [place] 06/15/2012, 7:48 AM  LOS: 7 days    Attending Patient seen and examined, and agree with the above assessment and plan. She is stable, BP is controlled. She is still awaiting word from CIR whether or not if they are going to take him for rehabilitation.  S Galina Haddox

## 2012-06-15 NOTE — Progress Notes (Addendum)
Patient turns self in bed. Patient is incontinent x 2, bowel movement, patient bathed, made comfortable in bed.  Will continue to monitor.  Macarthur Critchley, RN  Patient has order for strict I's & O's, patient is incontinent.

## 2012-06-15 NOTE — Evaluation (Signed)
Occupational Therapy Evaluation Patient Details Name: Glenda Gonzalez MRN: [id number] DOB: [birth year] Today's Date: 06/15/2012 Time: 5621-3086 OT Time Calculation (min): 18 min  OT Assessment / Plan / Recommendation Clinical Impression  This 79 yo admitted with seizure presents to acute with problems below. Will benefit from acute OT with follow up at CIR.    OT Assessment  Patient needs continued OT Services    Follow Up Recommendations  CIR    Barriers to Discharge None    Equipment Recommendations  None recommended by OT       Frequency  Min 2X/week    Precautions / Restrictions Precautions Precautions: Fall Restrictions Weight Bearing Restrictions: No       ADL  Eating/Feeding: Simulated;Independent Where Assessed - Eating/Feeding: Chair Grooming: Simulated;Set up Where Assessed - Grooming: Supported sitting Upper Body Bathing: Simulated;Set up Where Assessed - Upper Body Bathing: Supported sitting Lower Body Bathing: Simulated;Maximal assistance Where Assessed - Lower Body Bathing: Supported sit to stand Upper Body Dressing: Simulated;Moderate assistance Where Assessed - Upper Body Dressing: Supported sitting Lower Body Dressing: Simulated;+1 Total assistance Where Assessed - Lower Body Dressing: Supported sit to Pharmacist, hospital: Simulated;Moderate assistance Toilet Transfer Method: Stand pivot Toilet Transfer Equipment:  (Bed (regular height) to recliner going to her left) Toileting - Clothing Manipulation and Hygiene: Simulated;Maximal assistance Where Assessed - Toileting Clothing Manipulation and Hygiene: Standing Equipment Used: Gait belt Transfers/Ambulation Related to ADLs: Min A sit to stand with increased time, Mod A stand to sit and pivot to recliner with me standing in front of her    OT Diagnosis: Generalized weakness  OT Problem List: Decreased strength;Impaired balance (sitting and/or standing);Decreased knowledge of use of DME or AE OT  Treatment Interventions: Self-care/ADL training;Therapeutic activities;DME and/or AE instruction;Patient/family education;Balance training   OT Goals Acute Rehab OT Goals OT Goal Formulation: With patient Time For Goal Achievement: 06/22/12 Potential to Achieve Goals: Good ADL Goals Pt Will Perform Grooming: Standing at sink (min guard A, 1 task) ADL Goal: Grooming - Progress: Goal set today Pt Will Perform Lower Body Bathing: with mod assist;Unsupported;Sit to stand from chair;Sit to stand from bed;with adaptive equipment ADL Goal: Lower Body Bathing - Progress: Goal set today Pt Will Perform Lower Body Dressing: with mod assist;Unsupported;with adaptive equipment;Sit to stand from bed;Sit to stand from chair ADL Goal: Lower Body Dressing - Progress: Goal set today Pt Will Transfer to Toilet: with min assist;Ambulation;with DME;3-in-1;Regular height toilet;Grab bars ADL Goal: Toilet Transfer - Progress: Goal set today Pt Will Perform Toileting - Clothing Manipulation: with min assist;Standing ADL Goal: Toileting - Clothing Manipulation - Progress: Goal set today Pt Will Perform Toileting - Hygiene: with mod assist;Sit to stand from 3-in-1/toilet ADL Goal: Toileting - Hygiene - Progress: Goal set today  Visit Information  Last OT Received On: 06/15/12 Assistance Needed: +2 (if ambulating)    Subjective Data  Subjective: Me and my husband have been married for 55 years Patient Stated Goal: Go to rehab   Prior Functioning     Home Living Lives With: Spouse;Son Available Help at Discharge: Family (son works during the day) Type of Home: House Home Access: Ramped entrance Home Layout: One level Bathroom Shower/Tub:  (sponge baths only) Bathroom Toilet: Standard Home Adaptive Equipment: Bedside commode/3-in-1;Walker - rolling;Straight cane Prior Function Level of Independence: Needs assistance Needs Assistance: Bathing;Toileting;Meal Prep;Light Housekeeping Bath:  Moderate Toileting: Maximal Meal Prep: Total Light Housekeeping: Total Gait Assistance: walks with RW or SPC with S Able to Take Stairs?: No Driving: No  Vocation: Retired Musician: No difficulties Dominant Hand: Right         Vision/Perception  Wears glasses all the time, glaucoma left eye   Cognition  Overall Cognitive Status: Appears within functional limits for tasks assessed/performed Arousal/Alertness: Awake/alert Orientation Level: Appears intact for tasks assessed Behavior During Session: [name] for tasks performed Problem Solving: No delay in processing seen today    Extremity/Trunk Assessment Right Upper Extremity Assessment RUE ROM/Strength/Tone: Within functional levels (grossly 3/5, tremors are noted) Left Upper Extremity Assessment LUE ROM/Strength/Tone: Within functional levels (grossly 3/5, tremors are noted)     Mobility Bed Mobility Bed Mobility: Rolling Right;Rolling Left;Left Sidelying to Sit;Sitting - Scoot to Edge of Bed Rolling Right: 6: Modified independent (Device/Increase time);With rail Rolling Left: 6: Modified independent (Device/Increase time);With rail Left Sidelying to Sit: 4: Min guard;HOB flat;With rails Sitting - Scoot to Edge of Bed: 4: Min guard;With rail Transfers Transfers: Sit to Stand;Stand to Sit Sit to Stand: 4: Min assist;With upper extremity assist;From bed (with increased time) Stand to Sit: 3: Mod assist;With upper extremity assist;With armrests;To chair/3-in-1 (decreased control of descent) Details for Transfer Assistance: VCs needed for safe hand placement              End of Session OT - End of Session Equipment Utilized During Treatment: Gait belt Activity Tolerance: Patient tolerated treatment well Patient left: in chair;with call bell/phone within reach Nurse Communication: Mobility status (Nursing student:+2 for safey stand pivot )       Glenda Gonzalez [phone] 06/15/2012, 10:17 AM

## 2012-06-15 NOTE — Progress Notes (Signed)
Following pt for possible CIR admit.  Met w/ pt's husband yesterday pm [pt asleep] & he agrees w/ CIR.  CIR admit depends on pt's medical stability, insurance authorization of coverage & bed availability.  [phone]

## 2012-06-15 NOTE — H&P (Signed)
Physical Medicine and Rehabilitation Admission H&P    Chief Complaint  Patient presents with  . Seizures  : HPI: Glenda Gonzalez is a 79 y.o. right-handed female with history of diabetes mellitus with peripheral neuropathy, dementia, coronary artery disease. Admitted 06/08/2012 after being found down questionable seizure frothing from the mouth. MRI of the brain showed subcentimeter solitary acute infarct right parietal gray matter junction without associated hemorrhage as was advanced atrophy. 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 Echocardiogram with ejection fraction of 55% and normal systolic function. Carotid Dopplers with right 40-59% ICA stenosis. EEG showed no seizure activity. Neurology consulted patient was loaded with Keppra for seizure. Aspirin was added for CVA prophylaxis. Subcutaneous heparin for DVT prophylaxis. Patient did require intubation for airway protection and was extubated 06/10/2012 without difficulty. Diet has slowly been slowly advanced to a regular consistency per speech therapy as well as noted patient being slow to process needing extra time to complete tasks. Physical therapy evaluation completed 06/12/2012 with recommendations for physical medicine rehabilitation consult to consider inpatient rehabilitation services. It was felt to be a good candidate for inpatient rehabilitation services and was admitted for a comprehensive rehabilitation program  Review of Systems  Cardiovascular: Positive for leg swelling.  Gastrointestinal:  Reflux  Musculoskeletal: Positive for joint pain.  Neurological: Positive for seizures and weakness.  All other systems reviewed and are negative   Past Medical History  Diagnosis Date  . Diabetes mellitus   . GERD (gastroesophageal reflux disease)   . Hypertension   . Diverticulitis   . Gout   . Essential and other specified forms of tremor   . Dementia   . CAD (coronary  artery disease)   . CHF (congestive heart failure)     Diastolic dysfunction   Past Surgical History  Procedure Date  . Dilation and curettage of uterus   . Tumor removal    Family History  Problem Relation Age of Onset  . Colon cancer Neg Hx   . Heart disease Father   . Heart disease Brother    Social History:  reports that she has never smoked. She has never used smokeless tobacco. She reports that she does not drink alcohol or use illicit drugs. Allergies:  Allergies  Allergen Reactions  . Codeine Other (See Comments)    hallucination  . Fentanyl Other (See Comments)    Abnormal behavior per husband  . Morphine Itching  . Penicillins Itching   Medications Prior to Admission  Medication Sig Dispense Refill  . acetaminophen (TYLENOL) 325 MG tablet Take 325-650 mg by mouth every 6 (six) hours as needed. Headache or pain      . bimatoprost (LUMIGAN) 0.03 % ophthalmic solution Place 1 drop into both eyes 2 (two) times daily.       Marland Kitchen donepezil (ARICEPT) 10 MG tablet Take 10 mg by mouth at bedtime.       Marland Kitchen esomeprazole (NEXIUM) 40 MG capsule Take 40 mg by mouth 2 (two) times daily before a meal.      . furosemide (LASIX) 20 MG tablet Take 20 mg by mouth daily.      Marland Kitchen loperamide (IMODIUM) 2 MG capsule Take 2 mg by mouth daily as needed. For diarrhea      . pregabalin (LYRICA) 50 MG capsule Take 50 mg by mouth 3 (three) times daily.        Bertram Gala Glycol-Propyl Glycol (SYSTANE ULTRA OP) Place 1 drop  into both eyes daily as needed. For dry eyes        Home: Home Living Lives With: Spouse;Son Available Help at Discharge: Family (son works during the day) Type of Home: House Home Access: Ramped entrance Home Layout: One level Bathroom Shower/Tub:  (takes sponge bath) Bathroom Toilet: Standard Bathroom Accessibility: No Home Adaptive Equipment: Bedside commode/3-in-1;Walker - rolling;Straight cane   Functional History: Prior Function Bath: Moderate Toileting:  Maximal Meal Prep: Unable to assess Light Housekeeping: Unable to assess  Functional Status:  Mobility: Bed Mobility Bed Mobility: Supine to Sit;Sitting - Scoot to Edge of Bed Supine to Sit: 3: Mod assist Sitting - Scoot to Edge of Bed: 3: Mod assist Transfers Transfers: Sit to Stand;Stand to Sit Sit to Stand: 1: +2 Total assist;From bed Sit to Stand: Patient Percentage: 60% Stand to Sit: 1: +2 Total assist;To chair/3-in-1 Stand to Sit: Patient Percentage: 60% Stand Pivot Transfers: 1: +2 Total assist Stand Pivot Transfers: Patient Percentage: 50% Ambulation/Gait Ambulation/Gait Assistance: 1: +2 Total assist Ambulation/Gait: Patient Percentage: 70% Ambulation Distance (Feet): 6 Feet Assistive device: Rolling walker Ambulation/Gait Assistance Details: Pt very slow to ambulate and needs extra time to complete task.  Pt with step to gait and shuffle steps.  Needs Max cues for upright posture, encourage increase step length and body position within RW. Gait Pattern: Step-to pattern;Decreased stride length;Shuffle;Trunk flexed Gait velocity:  very slow cadence due to questionable motoro planning    ADL:    Cognition: Cognition Arousal/Alertness: Awake/alert Orientation Level: Oriented to person;Oriented to place;Oriented to time Cognition Overall Cognitive Status: Impaired Area of Impairment: Problem solving Arousal/Alertness: Awake/alert Orientation Level: Oriented X4 / Intact Behavior During Session: WFL for tasks performed Problem Solving: Pt very slow to process and takes ~3 seconds to respond to questions asked.   Blood pressure 130/80, pulse 80, temperature 98.2 F (36.8 C), temperature source Oral, resp. rate 22, height 5' (1.524 m), weight 82.4 kg (181 lb 10.5 oz), SpO2 94.00%. Physical Exam  Vitals reviewed.  Pt is obese HENT: oral mucosa pink and moist Head: Normocephalic.  Eyes:  Pupils reactive to light without nystagmus . EOMI grossly intact Neck: Neck  supple. No thyromegaly present. No jvd. No LAD  Cardiovascular: Normal rate and regular rhythm. No murmur Pulmonary/Chest:  Decreased breath sounds with improved effort and clear to auscultation  Abdominal: Soft. Bowel sounds are normal. She exhibits no distension.  Neurological: She is alert.  Patient made good eye contact with examiner. She is slow to process with delay but improved from earlier in the week. Phonation is improved. Speech clear. Mild left facial weakness. She was able to give her name as well as place but had difficulty with age.  She provided biographical information. Follows one and two step commands.  Slower with left hand. Intentional tremor bilaterally.   Strength 3/5 UE in deltoid, biceps, triceps, and HI and 2 prox LE HF and KE  to 3/4 with ADF and APF . No gross sensory deficits although both legs were hypersensitive to touch below the knees to feet. Skin: Skin is warm and dry   Results for orders placed during the hospital encounter of 06/08/12 (from the past 48 hour(s))  GLUCOSE, CAPILLARY     Status: Abnormal   Collection Time   06/13/12 11:30 AM      Component Value Range Comment   Glucose-Capillary 392 (*) 70 - 99 mg/dL    Comment 1 Documented in Chart      Comment 2 Notify RN  GLUCOSE, CAPILLARY     Status: Abnormal   Collection Time   06/13/12  5:16 PM      Component Value Range Comment   Glucose-Capillary 135 (*) 70 - 99 mg/dL   GLUCOSE, CAPILLARY     Status: Abnormal   Collection Time   06/13/12 10:10 PM      Component Value Range Comment   Glucose-Capillary 300 (*) 70 - 99 mg/dL    Comment 1 Notify RN     GLUCOSE, CAPILLARY     Status: Abnormal   Collection Time   06/14/12  7:57 AM      Component Value Range Comment   Glucose-Capillary 189 (*) 70 - 99 mg/dL    Comment 1 Documented in Chart      Comment 2 Notify RN     GLUCOSE, CAPILLARY     Status: Abnormal   Collection Time   06/14/12 11:44 AM      Component Value Range Comment    Glucose-Capillary 246 (*) 70 - 99 mg/dL    Comment 1 Documented in Chart      Comment 2 Notify RN     GLUCOSE, CAPILLARY     Status: Abnormal   Collection Time   06/14/12  4:47 PM      Component Value Range Comment   Glucose-Capillary 298 (*) 70 - 99 mg/dL    Comment 1 Notify RN      Comment 2 Documented in Chart     GLUCOSE, CAPILLARY     Status: Abnormal   Collection Time   06/14/12  9:19 PM      Component Value Range Comment   Glucose-Capillary 203 (*) 70 - 99 mg/dL   GLUCOSE, CAPILLARY     Status: Abnormal   Collection Time   06/15/12  7:59 AM      Component Value Range Comment   Glucose-Capillary 186 (*) 70 - 99 mg/dL    Comment 1 Documented in Chart      Comment 2 Notify RN      No results found.  Post Admission Physician Evaluation: 1. Functional deficits secondary  to thrombotic right parietal infarct with associated seizure. 2. Patient is admitted to receive collaborative, interdisciplinary care between the physiatrist, rehab nursing staff, and therapy team. 3. Patient's level of medical complexity and substantial therapy needs in context of that medical necessity cannot be provided at a lesser intensity of care such as a SNF. 4. Patient has experienced substantial functional loss from his/her baseline which was documented above under the "Functional History" and "Functional Status" headings.  Judging by the patient's diagnosis, physical exam, and functional history, the patient has potential for functional progress which will result in measurable gains while on inpatient rehab.  These gains will be of substantial and practical use upon discharge  in facilitating mobility and self-care at the household level. 5. Physiatrist will provide 24 hour management of medical needs as well as oversight of the therapy plan/treatment and provide guidance as appropriate regarding the interaction of the two. 6. 24 hour rehab nursing will assist with bladder management, bowel management,  safety, skin/wound care, disease management, medication administration, pain management and patient education  and help integrate therapy concepts, techniques,education, etc. 7. PT will assess and treat for:  .Lower extremity strength, range of motion, stamina, balance, functional mobility, safety, adaptive techniques and equipment, NMR, education.  Goals are: supervision to min assist. 8. OT will assess and treat for: ADL's, functional mobility, safety, upper extremity strength, adaptive  techniques and equipment, NMR, education for patient and family.   Goals are: supervision to min/mod assist. 9. SLP will assess and treat for: cognition, speech,communication.  Goals are: mod I. 10. Case Management and Social Worker will assess and treat for psychological issues and discharge planning. 11. Team conference will be held weekly to assess progress toward goals and to determine barriers to discharge. 12. Patient will receive at least 3 hours of therapy per day at least 5 days per week. 13. ELOS: 2 weeks      Prognosis:  good   Medical Problem List and Plan: 1. Thrombotic right parietal infarct/seizure disorder 2. DVT Prophylaxis/Anticoagulation: Subcutaneous heparin. Monitor platelet counts and any signs of bleeding 3. Mood/dementia. Aricept 10 mg daily. Will discuss baseline cognition with family 4. Neuropsych: This patient is capable of making decisions on his/her own behalf. 5. Seizure disorder. EEG negative. Keppra 500 mg twice a day. Monitor for any seizure activity 6. Diabetes mellitus with peripheral neuropathy. Lantus insulin 10 units daily. Hemoglobin A1c pending. Check blood sugars a.c. and at bedtime. Patient on diet control prior to admission 7. Hypertension. Lotensin 5 mg daily, labetalol 100 mg twice a day. Monitor with increased activity 8. Diastolic congestive heart failure. Monitor for any signs of fluid overload. Patient on Lasix 20 mg daily prior to admission. Will resume as  indicated 9. GERD. Protonix  Ranelle Oyster, MD, [place] [facility]  06/15/2012

## 2012-06-16 ENCOUNTER — Inpatient Hospital Stay (HOSPITAL_COMMUNITY)
Admission: RE | Admit: 2012-06-16 | Discharge: 2012-06-26 | DRG: 945 | Disposition: A | Discharge status: Home Health | Payer: Medicare Other | Source: Intra-hospital | Attending: Physical Medicine & Rehabilitation | Admitting: Physical Medicine & Rehabilitation

## 2012-06-16 DIAGNOSIS — I2510 Atherosclerotic heart disease of native coronary artery without angina pectoris: Secondary | ICD-10-CM | POA: Diagnosis present

## 2012-06-16 DIAGNOSIS — G40909 Epilepsy, unspecified, not intractable, without status epilepticus: Secondary | ICD-10-CM | POA: Diagnosis present

## 2012-06-16 DIAGNOSIS — I6330 Cerebral infarction due to thrombosis of unspecified cerebral artery: Secondary | ICD-10-CM | POA: Diagnosis present

## 2012-06-16 DIAGNOSIS — K219 Gastro-esophageal reflux disease without esophagitis: Secondary | ICD-10-CM | POA: Diagnosis present

## 2012-06-16 DIAGNOSIS — I5032 Chronic diastolic (congestive) heart failure: Secondary | ICD-10-CM | POA: Diagnosis present

## 2012-06-16 DIAGNOSIS — R569 Unspecified convulsions: Secondary | ICD-10-CM

## 2012-06-16 DIAGNOSIS — I509 Heart failure, unspecified: Secondary | ICD-10-CM | POA: Diagnosis present

## 2012-06-16 DIAGNOSIS — F0390 Unspecified dementia without behavioral disturbance: Secondary | ICD-10-CM | POA: Diagnosis present

## 2012-06-16 DIAGNOSIS — E1142 Type 2 diabetes mellitus with diabetic polyneuropathy: Secondary | ICD-10-CM | POA: Diagnosis present

## 2012-06-16 DIAGNOSIS — I10 Essential (primary) hypertension: Secondary | ICD-10-CM | POA: Diagnosis present

## 2012-06-16 DIAGNOSIS — Z5189 Encounter for other specified aftercare: Secondary | ICD-10-CM | POA: Diagnosis present

## 2012-06-16 DIAGNOSIS — E1149 Type 2 diabetes mellitus with other diabetic neurological complication: Secondary | ICD-10-CM | POA: Diagnosis present

## 2012-06-16 DIAGNOSIS — I639 Cerebral infarction, unspecified: Secondary | ICD-10-CM

## 2012-06-16 LAB — CREATININE, SERUM
Creatinine, Ser: 1.17 mg/dL — ABNORMAL HIGH (ref 0.50–1.10)
GFR calc Af Amer: 51 mL/min — ABNORMAL LOW (ref >90)
GFR calc non Af Amer: 44 mL/min — ABNORMAL LOW (ref >90)

## 2012-06-16 LAB — CBC
HCT: 30.6 % — ABNORMAL LOW (ref 36.0–46.0)
Hemoglobin: 9.3 g/dL — ABNORMAL LOW (ref 12.0–15.0)
Hemoglobin: 10 g/dL — ABNORMAL LOW (ref 12.0–15.0)
MCHC: 32.7 g/dL (ref 30.0–36.0)
RBC: 3.47 MIL/uL — ABNORMAL LOW (ref 3.87–5.11)
RBC: 3.63 MIL/uL — ABNORMAL LOW (ref 3.87–5.11)
WBC: 6.3 10*3/uL (ref 4.0–10.5)
WBC: 6 10*3/uL (ref 4.0–10.5)

## 2012-06-16 LAB — HEMOGLOBIN A1C
Hgb A1c MFr Bld: 11.5 % — ABNORMAL HIGH (ref <5.7)
Mean Plasma Glucose: 283 mg/dL — ABNORMAL HIGH (ref <117)

## 2012-06-16 LAB — GLUCOSE, CAPILLARY: Glucose-Capillary: 239 mg/dL — ABNORMAL HIGH (ref 70–99)

## 2012-06-16 MED ORDER — HEPARIN SODIUM (PORCINE) 5000 UNIT/ML IJ SOLN: 5000.0000 [IU] | Freq: Three times a day (TID) | INTRAMUSCULAR | Status: DC

## 2012-06-16 MED ORDER — INSULIN ASPART 100 UNIT/ML ~~LOC~~ SOLN
0.0000 [IU] | Freq: Three times a day (TID) | SUBCUTANEOUS | Status: DC
  Administered 2012-06-16: 5 [IU] via SUBCUTANEOUS
  Administered 2012-06-17: 8 [IU] via SUBCUTANEOUS
  Administered 2012-06-17: 5 [IU] via SUBCUTANEOUS
  Administered 2012-06-17 – 2012-06-18 (×2): 3 [IU] via SUBCUTANEOUS
  Administered 2012-06-18 (×2): 5 [IU] via SUBCUTANEOUS
  Administered 2012-06-19: 3 [IU] via SUBCUTANEOUS
  Administered 2012-06-19: 2 [IU] via SUBCUTANEOUS
  Administered 2012-06-19: 5 [IU] via SUBCUTANEOUS
  Administered 2012-06-20: 3 [IU] via SUBCUTANEOUS
  Administered 2012-06-20: 5 [IU] via SUBCUTANEOUS
  Administered 2012-06-20 – 2012-06-22 (×7): 2 [IU] via SUBCUTANEOUS
  Administered 2012-06-23 (×2): 3 [IU] via SUBCUTANEOUS
  Administered 2012-06-23: 2 [IU] via SUBCUTANEOUS
  Administered 2012-06-24: 3 [IU] via SUBCUTANEOUS

## 2012-06-16 MED ORDER — LABETALOL HCL 100 MG PO TABS: 100.0000 mg | ORAL_TABLET | Freq: Two times a day (BID) | ORAL | Status: DC

## 2012-06-16 MED ORDER — ADULT MULTIVITAMIN LIQUID CH: 5.0000 mL | Freq: Every day | ORAL | Status: DC

## 2012-06-16 MED ORDER — LEVETIRACETAM 500 MG PO TABS
500.0000 mg | ORAL_TABLET | Freq: Two times a day (BID) | ORAL | Status: DC
  Administered 2012-06-16 – 2012-06-26 (×20): 500 mg via ORAL
  Filled 2012-06-16 (×23): qty 1

## 2012-06-16 MED ORDER — INSULIN GLARGINE 100 UNIT/ML ~~LOC~~ SOLN: 10.0000 [IU] | Freq: Every day | SUBCUTANEOUS | Status: DC

## 2012-06-16 MED ORDER — ASPIRIN 81 MG PO CHEW: 81.0000 mg | CHEWABLE_TABLET | Freq: Every day | ORAL | Status: DC

## 2012-06-16 MED ORDER — ONDANSETRON HCL 4 MG PO TABS
4.0000 mg | ORAL_TABLET | Freq: Four times a day (QID) | ORAL | Status: DC | PRN
  Administered 2012-06-19: 4 mg via ORAL
  Filled 2012-06-16: qty 1

## 2012-06-16 MED ORDER — BENAZEPRIL HCL 5 MG PO TABS: 5.0000 mg | ORAL_TABLET | Freq: Every day | ORAL | Status: DC

## 2012-06-16 MED ORDER — SENNA 8.6 MG PO TABS
1.0000 | ORAL_TABLET | Freq: Two times a day (BID) | ORAL | Status: DC
  Administered 2012-06-16 – 2012-06-20 (×5): 8.6 mg via ORAL
  Filled 2012-06-16 (×13): qty 1

## 2012-06-16 MED ORDER — ADULT MULTIVITAMIN W/MINERALS CH
1.0000 | ORAL_TABLET | Freq: Every day | ORAL | Status: DC
  Administered 2012-06-17 – 2012-06-19 (×3): 1 via ORAL
  Filled 2012-06-16 (×4): qty 1

## 2012-06-16 MED ORDER — ACETAMINOPHEN 325 MG PO TABS
325.0000 mg | ORAL_TABLET | ORAL | Status: DC | PRN
  Administered 2012-06-18 – 2012-06-21 (×5): 650 mg via ORAL
  Filled 2012-06-16 (×5): qty 2

## 2012-06-16 MED ORDER — DONEPEZIL HCL 10 MG PO TABS
10.0000 mg | ORAL_TABLET | Freq: Every day | ORAL | Status: DC
  Administered 2012-06-16 – 2012-06-25 (×10): 10 mg via ORAL
  Filled 2012-06-16 (×11): qty 1

## 2012-06-16 MED ORDER — PREGABALIN 25 MG PO CAPS
50.0000 mg | ORAL_CAPSULE | Freq: Three times a day (TID) | ORAL | Status: DC
  Administered 2012-06-16 – 2012-06-26 (×29): 50 mg via ORAL
  Filled 2012-06-16 (×9): qty 2
  Filled 2012-06-16: qty 1
  Filled 2012-06-16: qty 2
  Filled 2012-06-16: qty 1
  Filled 2012-06-16: qty 2
  Filled 2012-06-16: qty 1
  Filled 2012-06-16 (×11): qty 2
  Filled 2012-06-16: qty 1
  Filled 2012-06-16 (×5): qty 2

## 2012-06-16 MED ORDER — ONDANSETRON HCL 4 MG/2ML IJ SOLN: 4.0000 mg | Freq: Four times a day (QID) | INTRAMUSCULAR | Status: DC | PRN

## 2012-06-16 MED ORDER — HEPARIN SODIUM (PORCINE) 5000 UNIT/ML IJ SOLN
5000.0000 [IU] | Freq: Three times a day (TID) | INTRAMUSCULAR | Status: DC
  Administered 2012-06-16 – 2012-06-25 (×28): 5000 [IU] via SUBCUTANEOUS
  Filled 2012-06-16 (×32): qty 1

## 2012-06-16 MED ORDER — LABETALOL HCL 100 MG PO TABS
100.0000 mg | ORAL_TABLET | Freq: Two times a day (BID) | ORAL | Status: DC
  Administered 2012-06-16 – 2012-06-26 (×20): 100 mg via ORAL
  Filled 2012-06-16 (×23): qty 1

## 2012-06-16 MED ORDER — LEVETIRACETAM 500 MG PO TABS: 500.0000 mg | ORAL_TABLET | Freq: Two times a day (BID) | ORAL | Status: DC

## 2012-06-16 MED ORDER — ASPIRIN 81 MG PO CHEW
81.0000 mg | CHEWABLE_TABLET | Freq: Every day | ORAL | Status: DC
  Administered 2012-06-17 – 2012-06-26 (×10): 81 mg via ORAL
  Filled 2012-06-16 (×12): qty 1

## 2012-06-16 MED ORDER — BENAZEPRIL HCL 5 MG PO TABS
5.0000 mg | ORAL_TABLET | Freq: Every day | ORAL | Status: DC
  Administered 2012-06-17 – 2012-06-26 (×10): 5 mg via ORAL
  Filled 2012-06-16 (×11): qty 1

## 2012-06-16 MED ORDER — BIMATOPROST 0.01 % OP SOLN
1.0000 [drp] | Freq: Two times a day (BID) | OPHTHALMIC | Status: DC
  Administered 2012-06-16 – 2012-06-26 (×19): 1 [drp] via OPHTHALMIC
  Filled 2012-06-16 (×4): qty 2.5

## 2012-06-16 MED ORDER — INSULIN GLARGINE 100 UNIT/ML ~~LOC~~ SOLN
10.0000 [IU] | Freq: Every day | SUBCUTANEOUS | Status: DC
  Administered 2012-06-16 – 2012-06-22 (×7): 10 [IU] via SUBCUTANEOUS

## 2012-06-16 MED ORDER — PANTOPRAZOLE SODIUM 40 MG PO TBEC: 40.0000 mg | DELAYED_RELEASE_TABLET | Freq: Every day | ORAL | Status: DC

## 2012-06-16 MED ORDER — SORBITOL 70 % SOLN: 30.0000 mL | Freq: Every day | Status: DC | PRN

## 2012-06-16 MED ORDER — PANTOPRAZOLE SODIUM 40 MG PO TBEC
40.0000 mg | DELAYED_RELEASE_TABLET | Freq: Every day | ORAL | Status: DC
  Administered 2012-06-16 – 2012-06-18 (×3): 40 mg via ORAL
  Filled 2012-06-16 (×5): qty 1

## 2012-06-16 NOTE — Progress Notes (Signed)
Can admit pt CIR today, if she continues medically stable.  [phone]

## 2012-06-16 NOTE — Progress Notes (Signed)
Per MD order, central line removed. IV cathter intact. Vaseline pressure gauze to site, pressure held x 5 min, no bleeding to site. Pt instructed not to get out of bed for 30 min after the removal of the central line. Instucted to keep dressing CDI x 24 hours. Glenda Gonzalez M  

## 2012-06-16 NOTE — Progress Notes (Signed)
Physical Therapy Treatment Patient Details Name: KENYON ESHLEMAN MRN: [id number] DOB: [birth year] Today's Date: 06/16/2012 Time: 4098-1191 PT Time Calculation (min): 23 min  PT Assessment / Plan / Recommendation Comments on Treatment Session  Pt adm with seizure.  Pt making steady progress and ready for transfer to CIR    Follow Up Recommendations  CIR     Does the patient have the potential to tolerate intense rehabilitation     Barriers to Discharge        Equipment Recommendations  Rolling walker with 5" wheels    Recommendations for Other Services    Frequency Min 3X/week   Plan Discharge plan remains appropriate;Frequency remains appropriate    Precautions / Restrictions Precautions Precautions: Fall   Pertinent Vitals/Pain No c/o's     Mobility  Bed Mobility Rolling Right: 6: Modified independent (Device/Increase time);With rail Rolling Left: 4: Min assist;With rail Supine to Sit: 3: Mod assist;HOB elevated Sitting - Scoot to Edge of Bed: 4: Min assist Details for Bed Mobility Assistance: assist to bring trunk up. Transfers Sit to Stand: With upper extremity assist;From bed;3: Mod assist Stand to Sit: 4: Min assist;With upper extremity assist;With armrests;To chair/3-in-1 Details for Transfer Assistance: verbal cues for hand placement.  Incr time to come to stand. Ambulation/Gait Ambulation/Gait Assistance: 4: Min assist (+1 to follow with chair to incr distance.) Ambulation Distance (Feet): 25 Feet Assistive device: Rolling walker Ambulation/Gait Assistance Details: verbal cues to stand more erect and stay closer to walker Gait Pattern: Step-through pattern;Decreased stride length;Trunk flexed Gait velocity: decr    Exercises     PT Diagnosis:    PT Problem List:   PT Treatment Interventions:     PT Goals Acute Rehab PT Goals PT Goal: Supine/Side to Sit - Progress: Progressing toward goal PT Goal: Sit to Stand - Progress: Progressing toward goal PT  Goal: Stand to Sit - Progress: Met PT Goal: Stand - Progress: Progressing toward goal Pt will Ambulate: 51 - 150 feet;with supervision PT Goal: Ambulate - Progress: Updated due to goal met  Visit Information  Last PT Received On: 06/16/12 Assistance Needed: +2 (for safety)    Subjective Data  Subjective: "Let me try it myself," pt stated about standing.   Cognition  Overall Cognitive Status: Appears within functional limits for tasks assessed/performed Arousal/Alertness: Awake/alert Orientation Level: Appears intact for tasks assessed Behavior During Session: [facility] for tasks performed    Balance  Static Standing Balance Static Standing - Balance Support: Bilateral upper extremity supported Static Standing - Level of Assistance: 4: Min assist  End of Session PT - End of Session Equipment Utilized During Treatment: Gait belt Activity Tolerance: Patient tolerated treatment well Patient left: in chair Nurse Communication: Mobility status   GP     Kaipo Ardis 06/16/2012, 11:53 AM  Skip Mayer PT [phone]

## 2012-06-16 NOTE — Care Management Note (Signed)
    Page 1 of 2   06/16/2012     10:31:17 AM   CARE MANAGEMENT NOTE 06/16/2012  Patient:  MIRREN, GEST   Account Number:  [id number]  Date Initiated:  06/14/2012  Documentation initiated by:  Donn Pierini  Subjective/Objective Assessment:   Pt admitted with Sz- and + for lacunar infarct seen on CT - acute resp failure, ?CHF     Action/Plan:   PTA pt lived at home with spouse- PT/OT evals- CIR consult   Anticipated DC Date:  06/16/2012   Anticipated DC Plan:  IP REHAB FACILITY      DC Planning Services  CM consult      PAC Choice  IP REHAB   Choice offered to / List presented to:  C-1 Patient           Status of service:  Completed, signed off Medicare Important Message given?   (If response is "NO", the following Medicare IM given date fields will be blank) Date Medicare IM given:   Date Additional Medicare IM given:    Discharge Disposition:  IP REHAB FACILITY  Per UR Regulation:  Reviewed for med. necessity/level of care/duration of stay  If discussed at Long Length of Stay Meetings, dates discussed:   06/15/2012    Comments:  06/16/12 10:30 Letha Cape RN, BSN [phone] patient for dc to CIR today, authorization received and bed available.  06/15/12 14:18 Letha Cape RN, BSN [phone] awaiting authorization for  CIR, patient states if CIR is not authorized she wants to go home with [facility], she does not want to go to a snf.  I spoke with Thurston Hole and she states at this time she has not heared from the insurance co. and they have filled all the beds , so if she does get the authrization today they will not be able to admit patient until tomorrow.  06/14/12- 1600- Donn Pierini RN, BSN [phone] CIR consulted for possible admission- NCM to follow for d/c needs/plan- plan for possible CIR

## 2012-06-16 NOTE — H&P (View-Only) (Signed)
Physical Medicine and Rehabilitation Admission H&P    Chief Complaint  Patient presents with  . Seizures  : HPI: Glenda Gonzalez is a 79 y.o. right-handed female with history of diabetes mellitus with peripheral neuropathy, dementia, coronary artery disease. Admitted 06/08/2012 after being found down questionable seizure frothing from the mouth. MRI of the brain showed subcentimeter solitary acute infarct right parietal gray matter junction without associated hemorrhage as was advanced atrophy. 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 Echocardiogram with ejection fraction of 55% and normal systolic function. Carotid Dopplers with right 40-59% ICA stenosis. EEG showed no seizure activity. Neurology consulted patient was loaded with Keppra for seizure. Aspirin was added for CVA prophylaxis. Subcutaneous heparin for DVT prophylaxis. Patient did require intubation for airway protection and was extubated 06/10/2012 without difficulty. Diet has slowly been slowly advanced to a regular consistency per speech therapy as well as noted patient being slow to process needing extra time to complete tasks. Physical therapy evaluation completed 06/12/2012 with recommendations for physical medicine rehabilitation consult to consider inpatient rehabilitation services. It was felt to be a good candidate for inpatient rehabilitation services and was admitted for a comprehensive rehabilitation program  Review of Systems  Cardiovascular: Positive for leg swelling.  Gastrointestinal:  Reflux  Musculoskeletal: Positive for joint pain.  Neurological: Positive for seizures and weakness.  All other systems reviewed and are negative   Past Medical History  Diagnosis Date  . Diabetes mellitus   . GERD (gastroesophageal reflux disease)   . Hypertension   . Diverticulitis   . Gout   . Essential and other specified forms of tremor   . Dementia   . CAD (coronary  artery disease)   . CHF (congestive heart failure)     Diastolic dysfunction   Past Surgical History  Procedure Date  . Dilation and curettage of uterus   . Tumor removal    Family History  Problem Relation Age of Onset  . Colon cancer Neg Hx   . Heart disease Father   . Heart disease Brother    Social History:  reports that she has never smoked. She has never used smokeless tobacco. She reports that she does not drink alcohol or use illicit drugs. Allergies:  Allergies  Allergen Reactions  . Codeine Other (See Comments)    hallucination  . Fentanyl Other (See Comments)    Abnormal behavior per husband  . Morphine Itching  . Penicillins Itching   Medications Prior to Admission  Medication Sig Dispense Refill  . acetaminophen (TYLENOL) 325 MG tablet Take 325-650 mg by mouth every 6 (six) hours as needed. Headache or pain      . bimatoprost (LUMIGAN) 0.03 % ophthalmic solution Place 1 drop into both eyes 2 (two) times daily.       . donepezil (ARICEPT) 10 MG tablet Take 10 mg by mouth at bedtime.       . esomeprazole (NEXIUM) 40 MG capsule Take 40 mg by mouth 2 (two) times daily before a meal.      . furosemide (LASIX) 20 MG tablet Take 20 mg by mouth daily.      . loperamide (IMODIUM) 2 MG capsule Take 2 mg by mouth daily as needed. For diarrhea      . pregabalin (LYRICA) 50 MG capsule Take 50 mg by mouth 3 (three) times daily.        . Polyethyl Glycol-Propyl Glycol (SYSTANE ULTRA OP) Place 1 drop   into both eyes daily as needed. For dry eyes        Home: Home Living Lives With: Spouse;Son Available Help at Discharge: Family (son works during the day) Type of Home: House Home Access: Ramped entrance Home Layout: One level Bathroom Shower/Tub:  (takes sponge bath) Bathroom Toilet: Standard Bathroom Accessibility: No Home Adaptive Equipment: Bedside commode/3-in-1;Walker - rolling;Straight cane   Functional History: Prior Function Bath: Moderate Toileting:  Maximal Meal Prep: Unable to assess Light Housekeeping: Unable to assess  Functional Status:  Mobility: Bed Mobility Bed Mobility: Supine to Sit;Sitting - Scoot to Edge of Bed Supine to Sit: 3: Mod assist Sitting - Scoot to Edge of Bed: 3: Mod assist Transfers Transfers: Sit to Stand;Stand to Sit Sit to Stand: 1: +2 Total assist;From bed Sit to Stand: Patient Percentage: 60% Stand to Sit: 1: +2 Total assist;To chair/3-in-1 Stand to Sit: Patient Percentage: 60% Stand Pivot Transfers: 1: +2 Total assist Stand Pivot Transfers: Patient Percentage: 50% Ambulation/Gait Ambulation/Gait Assistance: 1: +2 Total assist Ambulation/Gait: Patient Percentage: 70% Ambulation Distance (Feet): 6 Feet Assistive device: Rolling walker Ambulation/Gait Assistance Details: Pt very slow to ambulate and needs extra time to complete task.  Pt with step to gait and shuffle steps.  Needs Max cues for upright posture, encourage increase step length and body position within RW. Gait Pattern: Step-to pattern;Decreased stride length;Shuffle;Trunk flexed Gait velocity:  very slow cadence due to questionable motoro planning    ADL:    Cognition: Cognition Arousal/Alertness: Awake/alert Orientation Level: Oriented to person;Oriented to place;Oriented to time Cognition Overall Cognitive Status: Impaired Area of Impairment: Problem solving Arousal/Alertness: Awake/alert Orientation Level: Oriented X4 / Intact Behavior During Session: WFL for tasks performed Problem Solving: Pt very slow to process and takes ~3 seconds to respond to questions asked.   Blood pressure 130/80, pulse 80, temperature 98.2 F (36.8 C), temperature source Oral, resp. rate 22, height 5' (1.524 m), weight 82.4 kg (181 lb 10.5 oz), SpO2 94.00%. Physical Exam  Vitals reviewed.  Pt is obese HENT: oral mucosa pink and moist Head: Normocephalic.  Eyes:  Pupils reactive to light without nystagmus . EOMI grossly intact Neck: Neck  supple. No thyromegaly present. No jvd. No LAD  Cardiovascular: Normal rate and regular rhythm. No murmur Pulmonary/Chest:  Decreased breath sounds with improved effort and clear to auscultation  Abdominal: Soft. Bowel sounds are normal. She exhibits no distension.  Neurological: She is alert.  Patient made good eye contact with examiner. She is slow to process with delay but improved from earlier in the week. Phonation is improved. Speech clear. Mild left facial weakness. She was able to give her name as well as place but had difficulty with age.  She provided biographical information. Follows one and two step commands.  Slower with left hand. Intentional tremor bilaterally.   Strength 3/5 UE in deltoid, biceps, triceps, and HI and 2 prox LE HF and KE  to 3/4 with ADF and APF . No gross sensory deficits although both legs were hypersensitive to touch below the knees to feet. Skin: Skin is warm and dry   Results for orders placed during the hospital encounter of 06/08/12 (from the past 48 hour(s))  GLUCOSE, CAPILLARY     Status: Abnormal   Collection Time   06/13/12 11:30 AM      Component Value Range Comment   Glucose-Capillary 392 (*) 70 - 99 mg/dL    Comment 1 Documented in Chart      Comment 2 Notify RN       GLUCOSE, CAPILLARY     Status: Abnormal   Collection Time   06/13/12  5:16 PM      Component Value Range Comment   Glucose-Capillary 135 (*) 70 - 99 mg/dL   GLUCOSE, CAPILLARY     Status: Abnormal   Collection Time   06/13/12 10:10 PM      Component Value Range Comment   Glucose-Capillary 300 (*) 70 - 99 mg/dL    Comment 1 Notify RN     GLUCOSE, CAPILLARY     Status: Abnormal   Collection Time   06/14/12  7:57 AM      Component Value Range Comment   Glucose-Capillary 189 (*) 70 - 99 mg/dL    Comment 1 Documented in Chart      Comment 2 Notify RN     GLUCOSE, CAPILLARY     Status: Abnormal   Collection Time   06/14/12 11:44 AM      Component Value Range Comment    Glucose-Capillary 246 (*) 70 - 99 mg/dL    Comment 1 Documented in Chart      Comment 2 Notify RN     GLUCOSE, CAPILLARY     Status: Abnormal   Collection Time   06/14/12  4:47 PM      Component Value Range Comment   Glucose-Capillary 298 (*) 70 - 99 mg/dL    Comment 1 Notify RN      Comment 2 Documented in Chart     GLUCOSE, CAPILLARY     Status: Abnormal   Collection Time   06/14/12  9:19 PM      Component Value Range Comment   Glucose-Capillary 203 (*) 70 - 99 mg/dL   GLUCOSE, CAPILLARY     Status: Abnormal   Collection Time   06/15/12  7:59 AM      Component Value Range Comment   Glucose-Capillary 186 (*) 70 - 99 mg/dL    Comment 1 Documented in Chart      Comment 2 Notify RN      No results found.  Post Admission Physician Evaluation: 1. Functional deficits secondary  to thrombotic right parietal infarct with associated seizure. 2. Patient is admitted to receive collaborative, interdisciplinary care between the physiatrist, rehab nursing staff, and therapy team. 3. Patient's level of medical complexity and substantial therapy needs in context of that medical necessity cannot be provided at a lesser intensity of care such as a SNF. 4. Patient has experienced substantial functional loss from his/her baseline which was documented above under the "Functional History" and "Functional Status" headings.  Judging by the patient's diagnosis, physical exam, and functional history, the patient has potential for functional progress which will result in measurable gains while on inpatient rehab.  These gains will be of substantial and practical use upon discharge  in facilitating mobility and self-care at the household level. 5. Physiatrist will provide 24 hour management of medical needs as well as oversight of the therapy plan/treatment and provide guidance as appropriate regarding the interaction of the two. 6. 24 hour rehab nursing will assist with bladder management, bowel management,  safety, skin/wound care, disease management, medication administration, pain management and patient education  and help integrate therapy concepts, techniques,education, etc. 7. PT will assess and treat for:  .Lower extremity strength, range of motion, stamina, balance, functional mobility, safety, adaptive techniques and equipment, NMR, education.  Goals are: supervision to min assist. 8. OT will assess and treat for: ADL's, functional mobility, safety, upper extremity strength, adaptive   techniques and equipment, NMR, education for patient and family.   Goals are: supervision to min/mod assist. 9. SLP will assess and treat for: cognition, speech,communication.  Goals are: mod I. 10. Case Management and Social Worker will assess and treat for psychological issues and discharge planning. 11. Team conference will be held weekly to assess progress toward goals and to determine barriers to discharge. 12. Patient will receive at least 3 hours of therapy per day at least 5 days per week. 13. ELOS: 2 weeks      Prognosis:  good   Medical Problem List and Plan: 1. Thrombotic right parietal infarct/seizure disorder 2. DVT Prophylaxis/Anticoagulation: Subcutaneous heparin. Monitor platelet counts and any signs of bleeding 3. Mood/dementia. Aricept 10 mg daily. Will discuss baseline cognition with family 4. Neuropsych: This patient is capable of making decisions on his/her own behalf. 5. Seizure disorder. EEG negative. Keppra 500 mg twice a day. Monitor for any seizure activity 6. Diabetes mellitus with peripheral neuropathy. Lantus insulin 10 units daily. Hemoglobin A1c pending. Check blood sugars a.c. and at bedtime. Patient on diet control prior to admission 7. Hypertension. Lotensin 5 mg daily, labetalol 100 mg twice a day. Monitor with increased activity 8. Diastolic congestive heart failure. Monitor for any signs of fluid overload. Patient on Lasix 20 mg daily prior to admission. Will resume as  indicated 9. GERD. Protonix  Dawanda Mapel T. Sahvanna Mcmanigal, MD, FAAPMR  06/15/2012  

## 2012-06-16 NOTE — Plan of Care (Signed)
Overall Plan of Care [facility]) Patient Details Name: Glenda Gonzalez MRN: [id number] DOB: [birth year]  Diagnosis:  Rehabilitation for right parietal infarct causing left hemiparesis and fine motor deficits  Co-morbidities: Diabetes mellitus  .  GERD (gastroesophageal reflux disease)  .  Hypertension  .  Diverticulitis  .  Gout  .  Essential and other specified forms of tremor  .  Dementia  .  CAD (coronary artery disease)  .  CHF (congestive heart failure)    Functional Problem List  Patient demonstrates impairments in the following areas: Balance, Bladder, Bowel, Edema, Endurance, Medication Management, Motor, Pain, Safety, Sensory  and Skin Integrity  Basic ADL's: grooming, bathing, dressing and toileting Advanced ADL's:   Transfers:  bed mobility, bed to chair, toilet, tub/shower, car, furniture and floor Locomotion:  ambulation, wheelchair mobility and stairs  Additional Impairments:  Functional use of upper extremity  Anticipated Outcomes Item Anticipated Outcome  Eating/Swallowing  Min assist for set up  Basic self-care  supervision  Tolieting  supervision  Bowel/Bladder  Continent of bowel and bladder with toileting  Transfers  Supervision overall  Locomotion  Supervision overall for ambulation x 150'; w/c propulsion x 150' and up/down 5 steps  Communication    Cognition    Pain  3 or less on scale of 1-10  Safety/Judgment  spervision  Other     Therapy Plan: PT Intensity: Minimum of 1-2 x/day ,45 to 90 minutes PT Frequency: 5 out of 7 days PT duration: 12-14 days OT Intensity: Minimum of 1-2 x/day, 45 to 90 minutes OT Frequency: 5 out of 7 days OT Duration/Estimated Length of Stay: 10-14 days      Team Interventions: Item RN PT OT SLP SW TR Other  Self Care/Advanced ADL Retraining   x      Neuromuscular Re-Education  x x      Therapeutic Activities  x x      UE/LE Strength Training/ROM  x x      UE/LE Coordination Activities  x x       Visual/Perceptual Remediation/Compensation         DME/Adaptive Equipment Instruction  x x      Therapeutic Exercise  x x      Balance/Vestibular Training  x x      Patient/Family Education x x x      Cognitive Remediation/Compensation   x      Functional Mobility Training  x       Ambulation/Gait Training  x       Stair Training  x       Wheelchair Propulsion/Positioning  x       Functional Statistician  x       Community Reintegration   x      Dysphagia/Aspiration Film/video editor         Bladder Management x        Bowel Management x        Disease Management/Prevention x        Pain Management x        Medication Management x        Skin Care/Wound Management x        Splinting/Orthotics         Discharge Planning x x x      Psychosocial Support x x x  Team Discharge Planning: Destination: PT-Home ,OT- Home , SLP-  Projected Follow-up: PT-Home health PT, OT-  Home health OT, SLP-  Projected Equipment Needs: PT- TBD; pt owns RW and w/c- , OT- Other (comment);Tub/shower bench (may want tub bench), SLP-  Patient/family involved in discharge planning: PT- Patient,  OT-Patient, SLP-   MD ELOS: 10 days Medical Rehab Prognosis:  Good Assessment: 78 rolled female admitted for right parietal infarct. She now requires CIR level PT OT and therapy as well as 24 7 rehabilitation RN in nursing. Team will be monitoring medical status as well as working on ADLs, pre-gait training, gait training, memory attention concentration, neuromuscular reeducation balance and safety.    See Team Conference Notes for weekly updates to the plan of care

## 2012-06-16 NOTE — Interval H&P Note (Signed)
Glenda Gonzalez was admitted today to Inpatient Rehabilitation with the diagnosis of thrombotic right parietal infarct.  The patient's history has been reviewed, patient examined, and there is no change in status.  Patient continues to be appropriate for intensive inpatient rehabilitation.  I have reviewed the patient's chart and labs.  Questions were answered to the patient's satisfaction.  Kenndra Morris T 06/16/2012, 5:21 PM

## 2012-06-16 NOTE — PMR Pre-admission (Signed)
PMR Admission Coordinator Pre-Admission Assessment  Patient: Glenda Gonzalez is an 79 y.o., female MRN: [id number] DOB: [birth year] Height: 5' (152.4 cm) Weight: 81.4 kg (179 lb 7.3 oz)              Insurance Information HMO:yes      PPO:       PCP:       IPA:       80/20:       OTHER:   PRIMARY: [employer] Medicare      Policy#: [id number]      Subscriber: pt CM Name: Bertram Denver      Phone#: [phone]       Pre-Cert#: [id number]      Employer: retired Benefits:  Phone #: [phone]    Name: Larene Beach. Date: 05-26-12     Deduct: 0      Out of Pocket Max: $4900.00      Life Max: 0 CIR: $295.00/day days 1-5      SNF: $25.00/day days 1-20; $152.00/day days 21-49; 0 days 50-100 Outpatient:     Co-Pay: $45.00/visit Home Health: 100%      Co-Pay: 0 [ No more than 8 hrs/day; 35 hrs/week] DME: 80%     Co-Pay: 20% Providers: UHC network  SECONDARY: T[employer]are for Life      Policy#: [id number]      Subscriber: her husband, Maryum Batterson       Emergency Contact Information Contact Information    Name Relation Home Work Mobile   [place] E Spouse [phone]  [phone]   Dylynn, Ketner [phone]       Current Medical History  Patient Admitting Diagnosis:  right parietal infarct, seizure  History of Present Illness:  79 y.o. right-handed female with history of diabetes mellitus with peripheral neuropathy, dementia, coronary artery disease. Admitted 06/08/2012 after being found down questionable seizure frothing from the mouth. MRI of the brain showed subcentimeter solitary acute infarct right parietal gray matter junction without associated hemorrhage as was advanced atrophy. 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 Echocardiogram with ejection fraction of 55% and normal systolic function. Carotid Dopplers with right 40-59% ICA stenosis. EEG showed no seizure activity. Neurology consulted patient was loaded with Keppra for seizure.  Aspirin was added for CVA prophylaxis. Subcutaneous heparin for DVT prophylaxis. Patient did require intubation for airway protection and was extubated 06/10/2012 without difficulty. Diet has slowly been advanced to a dysphagia 2 nectar thick liquids per speech therapy as well as noted patient being slow to process needing extra time to complete tasks.         Past Medical History  Past Medical History  Diagnosis Date  . Diabetes mellitus   . GERD (gastroesophageal reflux disease)   . Hypertension   . Diverticulitis   . Gout   . Essential and other specified forms of tremor   . Dementia   . CAD (coronary artery disease)   . CHF (congestive heart failure)     Diastolic dysfunction    Family History  family history includes Heart disease in her brother and father.  There is no history of Colon cancer.  Prior Rehab/Hospitalizations: has been SNF before-husband took her out, did not feel SNF was helpful.  Family worked w/ her at home.   Current Medications  Current facility-administered medications:acetaminophen (TYLENOL) tablet 325 mg, 325 mg, Oral, Q6H PRN, Lonia Blood, MD;  aspirin chewable tablet 81 mg, 81 mg, Oral, Daily, Saima  Rizwan, MD, 81 mg at 06/16/12 1055;  benazepril (LOTENSIN) tablet 5 mg, 5 mg, Oral, Daily, Lonia Blood, MD, 5 mg at 06/16/12 1055 bimatoprost (LUMIGAN) 0.01 % ophthalmic solution 1 drop, 1 drop, Both Eyes, BID, Lonia Farber, MD, 1 drop at 06/16/12 1055;  donepezil (ARICEPT) tablet 10 mg, 10 mg, Oral, QHS, Saima Rizwan, MD, 10 mg at 06/15/12 2233;  heparin injection 5,000 Units, 5,000 Units, Subcutaneous, Q8H, Lonia Farber, MD, 5,000 Units at 06/16/12 0528 insulin aspart (novoLOG) injection 0-15 Units, 0-15 Units, Subcutaneous, TID WC, Calvert Cantor, MD, 2 Units at 06/16/12 0903;  insulin glargine (LANTUS) injection 10 Units, 10 Units, Subcutaneous, QHS, Lonia Blood, MD, 10 Units at 06/15/12 2233;  labetalol (NORMODYNE)  tablet 100 mg, 100 mg, Oral, BID, Calvert Cantor, MD, 100 mg at 06/16/12 1055;  labetalol (NORMODYNE,TRANDATE) injection 5 mg, 5 mg, Intravenous, Q2H PRN, Lonia Blood, MD levETIRAcetam (KEPPRA) tablet 500 mg, 500 mg, Oral, BID, Drake Leach Rumbarger, PHARMD, 500 mg at 06/16/12 1055;  multivitamin liquid 5 mL, 5 mL, Per Tube, Daily, Heather Cornelison Pitts, RD, 5 mL at 06/16/12 1055;  pantoprazole (PROTONIX) EC tablet 40 mg, 40 mg, Oral, QHS, Saima Rizwan, MD, 40 mg at 06/15/12 2233;  pregabalin (LYRICA) capsule 50 mg, 50 mg, Oral, TID, Calvert Cantor, MD, 50 mg at 06/16/12 1100 sodium chloride 0.9 % injection 10-40 mL, 10-40 mL, Intracatheter, PRN, Lonia Blood, MD, 10 mL at 06/16/12 0524  Patients Current Diet: Carb Control[ Increased 1/20 to regular consistency, thin liquids]  Dentures are at home.  Precautions / Restrictions Precautions Precautions: Fall Restrictions Weight Bearing Restrictions: No   Prior Activity Level  Husband assisted w/ bathing.  Pt used BSC at home.  Husband said she ambulated to & used bathroom at daughter's. Home Assistive Devices / Equipment Home Assistive Devices/Equipment: Wheelchair;Cane (specify quad or straight) Home Adaptive Equipment: Bedside commode/3-in-1;Walker - rolling;Straight cane  Prior Functional Level Prior Function Level of Independence: Needs assistance Needs Assistance: Bathing;Toileting;Meal Prep;Light Housekeeping Bath: Moderate Toileting: Maximal Meal Prep: Total Light Housekeeping: Total Gait Assistance: walks with RW or SPC with S Able to Take Stairs?: No Driving: No Vocation: Retired  Current Functional Level Cognition  Arousal/Alertness: Awake/alert Overall Cognitive Status: Appears within functional limits for tasks assessed/performed Orientation Level: Oriented X4    Extremity Assessment (includes Sensation/Coordination)  RUE ROM/Strength/Tone: Within functional levels (grossly 3/5, tremors are noted)  RLE  ROM/Strength/Tone: Deficits;Unable to fully assess;Due to impaired cognition RLE ROM/Strength/Tone Deficits: 4+/5 quad unable to further assess  RLE Sensation:  (unable to fully assess due to cognition) RLE Coordination: Deficits (impaird overall coordination)    ADLs  Eating/Feeding: Simulated;Independent Where Assessed - Eating/Feeding: Chair Grooming: Simulated;Set up Where Assessed - Grooming: Supported sitting Upper Body Bathing: Simulated;Set up Where Assessed - Upper Body Bathing: Supported sitting Lower Body Bathing: Simulated;Maximal assistance Where Assessed - Lower Body Bathing: Supported sit to stand Upper Body Dressing: Simulated;Moderate assistance Where Assessed - Upper Body Dressing: Supported sitting Lower Body Dressing: Simulated;+1 Total assistance Where Assessed - Lower Body Dressing: Supported sit to Pharmacist, hospital: Simulated;Moderate assistance Toilet Transfer Method: Stand pivot Toilet Transfer Equipment:  (Bed (regular height) to recliner going to her left) Toileting - Clothing Manipulation and Hygiene: Simulated;Maximal assistance Where Assessed - Toileting Clothing Manipulation and Hygiene: Standing Equipment Used: Gait belt Transfers/Ambulation Related to ADLs: Min A sit to stand with increased time, Mod A stand to sit and pivot to recliner with me standing in front of her  Mobility  Bed Mobility: Rolling Right;Rolling Left;Left Sidelying to Sit;Sitting - Scoot to Edge of Bed Rolling Right: 6: Modified independent (Device/Increase time);With rail Rolling Left: 6: Modified independent (Device/Increase time);With rail Left Sidelying to Sit: 4: Min guard;HOB flat;With rails Supine to Sit: 3: Mod assist Sitting - Scoot to Edge of Bed: 4: Min guard;With rail    Transfers  Transfers: Sit to Stand;Stand to Sit Sit to Stand: 4: Min assist;With upper extremity assist;From bed (with increased time) Sit to Stand: Patient Percentage: 60% Stand to Sit: 3:  Mod assist;With upper extremity assist;With armrests;To chair/3-in-1 (decreased control of descent) Stand to Sit: Patient Percentage: 60% Stand Pivot Transfers: 1: +2 Total assist Stand Pivot Transfers: Patient Percentage: 50%    Ambulation / Gait / Stairs / Wheelchair Mobility  Ambulation/Gait Ambulation/Gait Assistance: 1: +2 Total assist Ambulation/Gait: Patient Percentage: 70% Ambulation Distance (Feet): 6 Feet Assistive device: Rolling walker Ambulation/Gait Assistance Details: Pt very slow to ambulate and needs extra time to complete task.  Pt with step to gait and shuffle steps.  Needs Max cues for upright posture, encourage increase step length and body position within RW. Gait Pattern: Step-to pattern;Decreased stride length;Shuffle;Trunk flexed Gait velocity:  very slow cadence due to questionable motoro planning    Posture / Balance Static Sitting Balance Static Sitting - Balance Support: Feet supported Static Sitting - Level of Assistance: 5: Stand by assistance Static Standing Balance Static Standing - Balance Support: Bilateral upper extremity supported Static Standing - Level of Assistance: 4: Min assist Static Standing - Comment/# of Minutes: (A) to maintain balance and prevent posterior lean    Special needs/care consideration BiPAP/CPAP no CPM no Continuous Drip IV no Dialysis no        Days no Life Vest no Oxygen no Special Bed no Trach Size no Wound Vac (area) no      Location no Skin  dry                            Bowel mgmt: LBM 06/16/12 on BP, however, previously incontinent Bladder mgmt:  incontinent Diabetic mgmt yes     Previous Home Environment Living Arrangements: Spouse/significant other Lives With: Spouse;Son Available Help at Discharge: Family (son works during the day); [husband works 6:30-12:30-pt goes to daughter's then-husband will take off work if he needs to] Type of Home: Pt's house Home Layout: One level Home Access: Ramped  entrance Bathroom Shower/Tub:  (sponge baths only) Bathroom Toilet: Standard Bathroom Accessibility: No Home Care Services: No  Discharge Living Setting Plans for Discharge Living Setting: Patient's home Do you have any problems obtaining your medications?: No  Social/Family/Support Systems Patient Roles: Spouse;Parent Contact Information: home# [phone] Anticipated Caregiver: husband Anticipated Caregiver's Contact Information: husband's cell# [phone] Ability/Limitations of Caregiver: S-Min A Caregiver Availability: 24/7 (husband says pt is always w/ someone) Discharge Plan Discussed with Primary Caregiver: Yes Is Caregiver In Agreement with Plan?: Yes Does Caregiver/Family have Issues with Lodging/Transportation while Pt is in Rehab?: No    Goals/Additional Needs Patient/Family Goal for Rehab: S-Min A Expected length of stay: @ 2 weeks Dietary Needs: No beef or turkey--causes gout per husband Pt/Family Agrees to Admission and willing to participate: Yes Program Orientation Provided & Reviewed with Pt/Caregiver Including Roles  & Responsibilities: Yes   Decrease burden of Care through IP rehab admission: Decrease number of caregivers, Bowel and bladder program and Patient/family education   Possible need for SNF placement upon discharge:  no  Patient Condition: This patient's condition remains as documented in the Consult dated 06/14/12, in which the Rehabilitation Physician determined and documented that the patient's condition is appropriate for intensive rehabilitative care in an inpatient rehabilitation facility.  Preadmission Screen Completed By:  Brock Ra, 06/16/2012 11:42 AM ______________________________________________________________________   Discussed status with Dr. Riley Kill on 06/16/12 at 11:51am and received telephone approval for admission today.  Admission Coordinator:  Brock Ra, time 11:51am/Date 06/16/12

## 2012-06-16 NOTE — Discharge Summary (Signed)
Addendum  Patient seen and examined, chart and data base reviewed.  I agree with the above assessment and plan.  For full details please see Mrs. Glenda Smothers NP note.  Admitted to the hospital with seizure intubated for airway protection (acute respiratory failure).  Accelerated hypertension with lacunar CVA.  Discharge to CIR for further rehabilitation.   Clint Lipps, MD [facility] Pager: [phone] 06/16/2012, 12:41 PM

## 2012-06-16 NOTE — Progress Notes (Signed)
Patient admitted to 4149 via wheelchair, transferred to bed with +2 mod assist. Patient oriented to room, call light, routine, meal times, therapy schedule, team conference, white board, preferred name, rehab goal,safety plan, agreement, video, reviewed CMS data sheet, stroke education booklet and pamphlets, please see [facility] for detail of assessment. Glenda Gonzalez, Glenda Gonzalez Glenda Gonzalez

## 2012-06-16 NOTE — Discharge Summary (Signed)
Physician Discharge Summary  Glenda Gonzalez ZOX:[id number] DOB: 1934/03/31 DOA: 06/08/2012  PCP: Billee Cashing, MD  Admit date: 06/08/2012 Discharge date: 06/16/2012  Time spent: 45 minutes  Recommendations for Outpatient Follow-up:  1. Pt being discharged to inpatient rehab at [facility]  Discharge Diagnoses:  Principal Problem:  *Status epilepticus Active Problems:  DM  HYPERTENSION  CONGESTIVE HEART FAILURE UNSPECIFIED  Acute encephalopathy  Acute respiratory failure  Hyperglycemia   Discharge Condition: medically stable and ready for discharge to inpatient rehab  Diet recommendation: carb modified  Filed Weights   06/14/12 0500 06/15/12 0514 06/16/12 0530  Weight: 77.9 kg (171 lb 11.8 oz) 82.4 kg (181 lb 10.5 oz) 81.4 kg (179 lb 7.3 oz)    History of present illness:   Glenda Gonzalez is a 79 y.o. right-handed female with history of diabetes mellitus with peripheral neuropathy, dementia, coronary artery disease. Admitted 06/08/2012 after being found down questionable seizure frothing from the mouth. MRI of the brain showed subcentimeter solitary acute infarct right parietal gray matter junction without associated hemorrhage as was advanced atrophy. 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 Echocardiogram with ejection fraction of 55% and normal systolic function. Carotid Dopplers with right 40-59% ICA stenosis. EEG showed no seizure activity. Neurology consulted patient was loaded with Keppra for seizure. Aspirin was added for CVA prophylaxis. Patient did require intubation for airway protection and was extubated 06/10/2012 without difficulty.    Hospital Course:  Status epilepticus / Acute encephalopathy / Acute respiratory failure  On Keppra - stable - No further seizure activity. No resp distress. Oriented to self/place.  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 Continue ASA DM  CBGs control slowly improving - does not appear to be on meds as outpt - A1c pending  -appreciate diabetes coordinator input. Lantus 10u started 1/20. Continue moderate SSI. If no improvement and pt eating enough consider adding meal coverage  -  continue low dose ASA - add ACE  HYPERTENSION  not on hypertensives at home  - BP control improving SBP range 127-144 at discharge - ACE Added 1/20. Continue BB follow renal fxn  CONGESTIVE HEART FAILURE???  There was a suspicion of pulm edema on admission- ECHO did not reveal any significant pathology  - EF 50-55%, mild LVH and mild elevation of pulm pressures  -volume status -1.4L at discharge -documented weight stable at 77.5kg  -06/15/12 documented weight 82.4kg. Suspect difference attributable to transfer to floor and different scale. Does not appear overloaded.  At discharge wt 81.4kg.  Hypokalemia  Replaced - normalized  Dementia  Cont outpt meds. Appears at baseline Hx of CAD  No complaints CP. On ASA     Procedures: OETT 1/14 >>>>06/10/14 Consultations:  Neuro  PCCM  Discharge Exam: Filed Vitals:   06/15/12 2217 06/16/12 0530 06/16/12 0532 06/16/12 1055  BP: 137/76  141/81 127/77  Pulse: 64  61 65  Temp: 98.2 F (36.8 C)  98.2 F (36.8 C)   TempSrc: Oral  Oral   Resp: 20  20   Height:      Weight:  81.4 kg (179 lb 7.3 oz)  SpO2: 96%  92%     General: awake alert NAD Cardiovascular: RRR No MGR trace LEE  Respiratory: obese soft +BS non-tender to palpation  Discharge Instructions  Discharge Orders    Future Orders Please Complete By Expires   Diet - low sodium heart healthy      Increase activity slowly          Medication List     As  of 06/16/2012 12:02 PM    STOP taking these medications         furosemide 20 MG tablet   Commonly known as: LASIX      TAKE these medications         acetaminophen 325 MG tablet   Commonly known as: TYLENOL   Take 325-650 mg by mouth every 6 (six) hours as needed. Headache or pain      aspirin 81 MG chewable tablet   Chew 1 tablet (81 mg total) by mouth daily.      benazepril 5 MG tablet   Commonly known as: LOTENSIN   Take 1 tablet (5 mg total) by mouth daily.      bimatoprost 0.03 % ophthalmic solution   Commonly known as: LUMIGAN   Place 1 drop into both eyes 2 (two) times daily.      donepezil 10 MG tablet   Commonly known as: ARICEPT   Take 10 mg by mouth at bedtime.      esomeprazole 40 MG capsule   Commonly known as: NEXIUM   Take 40 mg by mouth 2 (two) times daily before a meal.      insulin glargine 100 UNIT/ML injection   Commonly known as: LANTUS   Inject 10 Units into the skin at bedtime.      labetalol 100 MG tablet   Commonly known as: NORMODYNE   Take 1 tablet (100 mg total) by mouth 2 (two) times daily.      levETIRAcetam 500 MG tablet   Commonly known as: KEPPRA   Take 1 tablet (500 mg total) by mouth 2 (two) times daily.      loperamide 2 MG capsule   Commonly known as: IMODIUM   Take 2 mg by mouth daily as needed. For diarrhea      multivitamin Liqd   Place 5 mLs into feeding tube daily.      pantoprazole 40 MG tablet   Commonly known as: PROTONIX   Take 1 tablet (40 mg total) by mouth at bedtime.      pregabalin 50 MG capsule   Commonly known as: LYRICA   Take 50 mg by mouth 3 (three) times daily.      SYSTANE ULTRA OP   Place 1 drop into both eyes daily as needed. For dry eyes          The results of significant diagnostics from this hospitalization (including imaging, microbiology, ancillary and laboratory) are listed below for reference.    Significant Diagnostic Studies: Ct Head Wo Contrast  06/09/2012  *RADIOLOGY REPORT*   Clinical Data: Unresponsive.  CT HEAD WITHOUT CONTRAST  Technique:  Contiguous axial images were obtained from the base of the skull through the vertex without contrast.  Comparison: 07/20/2011  Findings: Patchy opacification of bilateral ethmoid air cells. Atherosclerotic and physiologic intracranial calcifications. Diffuse parenchymal atrophy. Patchy areas of hypoattenuation in deep and periventricular white matter bilaterally. Negative for acute intracranial hemorrhage, mass lesion, acute infarction, midline shift, or mass-effect. Acute infarct may be inapparent on noncontrast CT. Ventricles  and sulci symmetric. Bone windows demonstrate no focal lesion.  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  IMPRESSION:  1. Negative for bleed or other acute intracranial process.  2. Atrophy and nonspecific white matter changes   Original Report Authenticated By: D. Andria Rhein, MD    Mr Maxine Glenn Head Wo Contrast  06/10/2012  *RADIOLOGY REPORT*  Clinical Data:  Altered mental status.  Possible cerebral infarct. Found down at home.  Hypertension.  MRI HEAD WITHOUT CONTRAST MRA HEAD WITHOUT CONTRAST  Technique:  Multiplanar, multiecho pulse sequences of the brain and surrounding structures were obtained without intravenous contrast. Angiographic images of the head were obtained using MRA technique without contrast.  Comparison:  CT head 06/09/2012, no prior MRI.  MRI HEAD  Findings:  Subcentimeter acute infarct is identified at the right parietal gray-white junction without associated hemorrhage.  There is marked atrophy with prominence of the ventricles, cisterns and sulci.  Extensive chronic microvascular ischemic changes present in the periventricular and subcortical white matter. Scattered remote lacunar infarcts most notable in the left thalamus and right putamen.   There is a remote left occipital infarct with gliosis and encephalomalacia.  Hydrocephalus ex vacuo is present.  Multiple remote  cerebellar infarcts are seen.  Partial empty sella.  No tonsillar herniation. Flow voids are maintained in the major intracranial vasculature appear Dependent fluid in the nasopharynx and sphenoid sinus.  Negative orbits, sinuses, and mastoids for acute process.  IMPRESSION: Subcentimeter solitary acute infarct right parietal gray-white junction without associated hemorrhage. Advanced atrophy with extensive chronic microvascular ischemic change.  MRA HEAD  Findings: The internal carotid arteries are widely patent.  The basilar artery is slightly small, in part due to origin of the right PCA from the carotid.  There is mild nonstenotic irregularity throughout the basilar.  Both vertebrals contribute to its formation with the  left dominant.  Significant multiple tandem lesions are seen throughout the distal right vertebral likely flow reducing. There is mild nonstenotic irregularity of the proximal middle cerebral arteries.  Moderate to severe stenosis of the proximal right anterior cerebral artery also involve the 08/02 and A3 segments.  The left anterior cerebral artery is dominant.  No proximal PCA stenosis.  No visible cerebellar branch occlusion.  IMPRESSION: Significant disease proximal right anterior cerebral.  Significant disease distal right vertebral.  Mild irregularity throughout the basilar.  No flow reducing carotid stenosis.   Original Report Authenticated By: Davonna Belling, M.D.    Mr Brain Wo Contrast  06/10/2012  *RADIOLOGY REPORT*  Clinical Data:  Altered mental status.  Possible cerebral infarct. Found down at home.  Hypertension.  MRI HEAD WITHOUT CONTRAST MRA HEAD WITHOUT CONTRAST  Technique:  Multiplanar, multiecho pulse sequences of the brain and surrounding structures were obtained without intravenous contrast. Angiographic images of the head were obtained using MRA technique without contrast.  Comparison:  CT head 06/09/2012, no prior MRI.  MRI HEAD  Findings:  Subcentimeter acute infarct is  identified at the right parietal gray-white junction without associated hemorrhage.  There is marked atrophy with prominence of the ventricles, cisterns and sulci.  Extensive chronic microvascular ischemic changes present in the periventricular and subcortical white matter. Scattered remote lacunar infarcts most notable in the left thalamus and right putamen.   There is a remote left occipital infarct with gliosis and encephalomalacia.  Hydrocephalus ex vacuo is present.  Multiple remote cerebellar infarcts are seen.  Partial empty sella.  No tonsillar herniation. Flow voids are maintained in the  major intracranial vasculature appear Dependent fluid in the nasopharynx and sphenoid sinus.  Negative orbits, sinuses, and mastoids for acute process.  IMPRESSION: Subcentimeter solitary acute infarct right parietal gray-white junction without associated hemorrhage. Advanced atrophy with extensive chronic microvascular ischemic change.  MRA HEAD  Findings: The internal carotid arteries are widely patent.  The basilar artery is slightly small, in part due to origin of the right PCA from the carotid.  There is mild nonstenotic irregularity throughout the basilar.  Both vertebrals contribute to its formation with the  left dominant.  Significant multiple tandem lesions are seen throughout the distal right vertebral likely flow reducing. There is mild nonstenotic irregularity of the proximal middle cerebral arteries.  Moderate to severe stenosis of the proximal right anterior cerebral artery also involve the 08/02 and A3 segments.  The left anterior cerebral artery is dominant.  No proximal PCA stenosis.  No visible cerebellar branch occlusion.  IMPRESSION: Significant disease proximal right anterior cerebral.  Significant disease distal right vertebral.  Mild irregularity throughout the basilar.  No flow reducing carotid stenosis.   Original Report Authenticated By: Davonna Belling, M.D.    Dg Chest Port 1 View  06/12/2012   *RADIOLOGY REPORT*  Clinical Data: Evaluate atelectasis  PORTABLE CHEST - 1 VIEW  Comparison: 06/10/2012; 06/09/2012; 12/19/2011  Findings: Grossly unchanged enlarged cardiac silhouette and mediastinal contours, possibly accentuated due to persistently reduced lung volumes.  Interval extubation and removal of enteric tube.  Otherwise, stable position remain support apparatus.  No pneumothorax.  Pulmonary venous congestion without frank evidence of edema.  Grossly unchanged perihilar and medial basilar opacities.  No new focal airspace opacities.  No definite pleural effusion.  Unchanged bones.  IMPRESSION: 1.  Interval extubation and removal of enteric tube.  No pneumothorax. 2.  Pulmonary venous congestion without frank evidence of edema.  3. Grossly unchanged perihilar and medial basilar opacities, atelectasis versus infiltrate.   Original Report Authenticated By: Tacey Ruiz, MD    Dg Chest Port 1 View  06/10/2012  *RADIOLOGY REPORT*  Clinical Data: Assess edema  PORTABLE CHEST - 1 VIEW  Comparison: Chest radiograph 06/09/2012  Findings: Normal cardiac silhouette.  Endotracheal tube, left central venous line, and NG tube are unchanged.  No pulmonary edema.  No pneumothorax.  Mild left basilar atelectasis.  IMPRESSION: Stable support apparatus.  No interval change.   Original Report Authenticated By: Genevive Bi, M.D.    Dg Chest Port 1 View  06/09/2012  *RADIOLOGY REPORT*  Clinical Data: Central line placement  PORTABLE CHEST - 1 VIEW  Comparison:   the previous day's study  Findings: Endotracheal tube has been repositioned, tip 3.3 cm above carina.  Left subclavian central line has been placed to the distal SVC.  No pneumothorax.  Nasogastric tube has been passed at least as far as the stomach, tip not seen.  Heart size upper limits normal as before.  Mildly tortuous ectatic thoracic aorta. Improved left lung aeration with some decrease in the perihilar atelectasis or infiltrates.  No effusion.   IMPRESSION:  1.  Support hardware projecting in expected location as above. 2.  No pneumothorax. 3.  Improved left lung aeration.   Original Report Authenticated By: D. Andria Rhein, MD    Dg Chest Portable 1 View  06/08/2012  *RADIOLOGY REPORT*  Clinical Data: Endotracheal tube position.  PORTABLE CHEST - 1 VIEW  Comparison: 12/19/2011  Findings: Interval placement endotracheal tube with tip in the right mainstem bronchus.  Consider retraction of the tube about  3 cm.  Shallow inspiration.  Slightly increased density in the left lung may represent atelectasis.  Mild cardiac enlargement with normal pulmonary vascularity.  No blunting of costophrenic angles. No pneumothorax.  Degenerative changes in the shoulders.  IMPRESSION: Endotracheal tube tip is in the right mainstem bronchus.  Results discussed by telephone at 2340 hours on 06/08/2012 with Dr. Jeraldine Loots.   Original Report Authenticated By: Burman Nieves, M.D.     Microbiology: Recent Results (from the past 240 hour(s))  URINE CULTURE     Status: Normal   Collection Time   06/08/12 11:37 PM      Component Value Range Status Comment   Specimen Description URINE, CATHETERIZED   Final    Special Requests NONE   Final    Culture  Setup Time 06/09/2012 00:17   Final    Colony Count NO GROWTH   Final    Culture NO GROWTH   Final    Report Status 06/10/2012 FINAL   Final   CULTURE, BLOOD (ROUTINE X 2)     Status: Normal   Collection Time   06/08/12 11:48 PM      Component Value Range Status Comment   Specimen Description BLOOD RIGHT HAND   Final    Special Requests BOTTLES DRAWN AEROBIC ONLY [facility]   Final    Culture  Setup Time 06/09/2012 08:47   Final    Culture NO GROWTH 5 DAYS   Final    Report Status 06/15/2012 FINAL   Final   CULTURE, BLOOD (ROUTINE X 2)     Status: Normal   Collection Time   06/08/12 11:50 PM      Component Value Range Status Comment   Specimen Description BLOOD ARM LEFT   Final    Special Requests BOTTLES DRAWN AEROBIC  AND ANAEROBIC 10CC   Final    Culture  Setup Time 06/09/2012 06:15   Final    Culture     Final    Value: DIPHTHEROIDS(CORYNEBACTERIUM SPECIES)     Note: Standardized susceptibility testing for this organism is not available.     Note: Gram Stain Report Called to,Read Back By and Verified With: GADE [facility] 06/11/12 1440 BY SMITHERJ   Report Status 06/13/2012 FINAL   Final   MRSA PCR SCREENING     Status: Normal   Collection Time   06/09/12  2:36 AM      Component Value Range Status Comment   MRSA by PCR NEGATIVE  NEGATIVE Final      Labs: Basic Metabolic Panel:  Lab 06/13/12 1610 06/12/12 0400 06/11/12 0545 06/10/12 0450  NA 141 143 144 142  K 3.9 3.3* 2.9* 3.2*  CL 107 107 105 105  CO2 23 25 26 26   GLUCOSE 267* 99 128* 196*  BUN 25* 19 26* 29*  CREATININE 1.02 0.91 0.97 1.15*  CALCIUM 8.9 9.2 8.9 8.2*  MG 2.2 1.5 -- --  PHOS -- 2.9 -- --   Liver Function Tests:  Lab 06/10/12 0450  AST 19  ALT 12  ALKPHOS 165*  BILITOT 0.2*  PROT 6.1  ALBUMIN 2.5*   No results found for this basename: LIPASE:5,AMYLASE:5 in the last 168 hours No results found for this basename: AMMONIA:5 in the last 168 hours CBC:  Lab 06/16/12 0525 06/13/12 0339 06/10/12 0450  WBC 6.3 6.7 7.3  NEUTROABS -- -- 4.6  HGB 9.3* 9.3* 9.8*  HCT 29.2* 29.4* 30.8*  MCV 84.1 83.8 83.7  PLT 225 199 206   Cardiac  Enzymes: No results found for this basename: CKTOTAL:5,CKMB:5,CKMBINDEX:5,TROPONINI:5 in the last 168 hours BNP: BNP (last 3 results)  Basename 06/08/12 2309  PROBNP 234.3   CBG:  Lab 06/16/12 0841 06/15/12 2216 06/15/12 1737 06/15/12 1159 06/15/12 0759  GLUCAP 135* 187* 254* 219* 186*       Signed:  Khiry Pasquariello M  [facility] 06/16/2012, 12:02 PM

## 2012-06-17 ENCOUNTER — Inpatient Hospital Stay (HOSPITAL_COMMUNITY): Payer: Medicare Other

## 2012-06-17 ENCOUNTER — Inpatient Hospital Stay (HOSPITAL_COMMUNITY): Payer: Medicare Other | Admitting: Speech Pathology

## 2012-06-17 ENCOUNTER — Inpatient Hospital Stay (HOSPITAL_COMMUNITY): Payer: Medicare Other | Admitting: Occupational Therapy

## 2012-06-17 ENCOUNTER — Inpatient Hospital Stay (HOSPITAL_COMMUNITY): Payer: Medicare Other | Admitting: *Deleted

## 2012-06-17 DIAGNOSIS — R569 Unspecified convulsions: Secondary | ICD-10-CM

## 2012-06-17 DIAGNOSIS — I69993 Ataxia following unspecified cerebrovascular disease: Secondary | ICD-10-CM

## 2012-06-17 DIAGNOSIS — I6330 Cerebral infarction due to thrombosis of unspecified cerebral artery: Secondary | ICD-10-CM

## 2012-06-17 LAB — CBC WITH DIFFERENTIAL/PLATELET
Basophils Absolute: 0 10*3/uL (ref 0.0–0.1)
Eosinophils Absolute: 0.3 10*3/uL (ref 0.0–0.7)
Eosinophils Relative: 4 % (ref 0–5)
Lymphocytes Relative: 35 % (ref 12–46)
MCV: 84 fL (ref 78.0–100.0)
Neutrophils Relative %: 54 % (ref 43–77)
Platelets: 244 10*3/uL (ref 150–400)
RBC: 3.93 MIL/uL (ref 3.87–5.11)
RDW: 19.1 % — ABNORMAL HIGH (ref 11.5–15.5)
WBC: 6.7 10*3/uL (ref 4.0–10.5)

## 2012-06-17 LAB — COMPREHENSIVE METABOLIC PANEL
ALT: 18 U/L (ref 0–35)
AST: 34 U/L (ref 0–37)
Alkaline Phosphatase: 176 U/L — ABNORMAL HIGH (ref 39–117)
CO2: 21 mEq/L (ref 19–32)
Calcium: 9.4 mg/dL (ref 8.4–10.5)
Potassium: 4.2 mEq/L (ref 3.5–5.1)
Sodium: 135 mEq/L (ref 135–145)
Total Protein: 7.1 g/dL (ref 6.0–8.3)

## 2012-06-17 LAB — GLUCOSE, CAPILLARY
Glucose-Capillary: 280 mg/dL — ABNORMAL HIGH (ref 70–99)
Glucose-Capillary: 217 mg/dL — ABNORMAL HIGH (ref 70–99)
Glucose-Capillary: 220 mg/dL — ABNORMAL HIGH (ref 70–99)

## 2012-06-17 NOTE — Progress Notes (Signed)
Physical Therapy Session Note  Patient Details  Name: Glenda Gonzalez MRN: [id number] Date of Birth: 02-15-1934  Today's Date: 06/17/2012 Time: 13:00-13:30 ( )   Skilled Therapeutic Interventions/Progress Updates:  Tx focused on transfer training, gait training, and balance assessment.  Supine>sit with flat bed and no rails with S and increased time/effort required.  Static seated edge of bed x69min with no UE support and no LOB.  Sit<>stand with Min A>>close S with cues for anterior translation.  Berg Balance test score 24/56, see details below. Pt has most difficulty with tasks in narrow BOS and SLS. Pt educated on findings.  Gait training 1x120' with RW and Min A for steadying in controlled environment. Instructed pt on proper RW management and stance position. Pt with decreased speed.  Sit<>stand and stand-step transfers with RW and Min A, cues for hand placement.  Pt left up in recliner with family present, all needs in reach.   Therapy Documentation Precautions:  Precautions Precautions: Fall Restrictions Weight Bearing Restrictions: No    Pain: Pain Assessment Pain Assessment: No/denies pain  Balance: Balance Balance Assessed: Yes Standardized Balance Assessment Standardized Balance Assessment: Berg Balance Test Berg Balance Test Sit to Stand: Needs minimal aid to stand or to stabilize Standing Unsupported: Able to stand 2 minutes with supervision Sitting with Back Unsupported but Feet Supported on Floor or Stool: Able to sit safely and securely 2 minutes Stand to Sit: Uses backs of legs against chair to control descent Transfers: Needs one person to assist Standing Unsupported with Eyes Closed: Able to stand 10 seconds with supervision Standing Ubsupported with Feet Together: Able to place feet together independently but unable to hold for 30 seconds From Standing, Reach Forward with Outstretched Arm: Can reach forward >5 cm safely (2") From Standing Position,  Pick up Object from Floor: Able to pick up shoe, needs supervision From Standing Position, Turn to Look Behind Over each Shoulder: Turn sideways only but maintains balance Turn 360 Degrees: Needs assistance while turning Standing Unsupported, Alternately Place Feet on Step/Stool: Needs assistance to keep from falling or unable to try Standing Unsupported, One Foot in Front: Needs help to step but can hold 15 seconds Standing on One Leg: Unable to try or needs assist to prevent fall Total Score: 24  Static Sitting Balance Static Sitting - Balance Support: No upper extremity supported Static Sitting - Level of Assistance: 5: Stand by assistance Static Standing Balance Static Standing - Balance Support: Right upper extremity supported;Left upper extremity supported Static Standing - Level of Assistance: 4: Min assist Static Standing - Comment/# of Minutes: 2 minutes Dynamic Standing Balance Dynamic Standing - Level of Assistance: 3: Mod assist  See FIM for current functional status  Therapy/Group: Individual Therapy  Clydene Laming, PT, DPT   Eulogio Ditch, Luanne Krzyzanowski M 06/17/2012, 1:21 PM

## 2012-06-17 NOTE — Evaluation (Signed)
Occupational Therapy Assessment and Plan  Patient Details  Name: Glenda Gonzalez MRN: [id number] Date of Birth: 1934-05-12  OT Diagnosis: abnormal posture and muscle weakness (generalized) Rehab Potential: Rehab Potential: Excellent ELOS: 10-14 days   Today's Date: 06/17/2012 Time: 2841-3244 Time Calculation (min): 68 min  Problem List:  Patient Active Problem List  Diagnosis  . DM  . GOUT  . DEMENTIA, MILD  . TREMOR, ESSENTIAL  . HYPERTENSION  . CAD  . CONGESTIVE HEART FAILURE UNSPECIFIED  . CLAUDICATION  . GERD  . OSTEOARTHRITIS  . Generalized weakness  . Fall  . Hypokalemia  . Hyponatremia  . Acute diarrhea  . C. difficile colitis  . UTI (lower urinary tract infection)  . Hypoglycemia  . Acute encephalopathy  . Acute respiratory failure  . Status epilepticus  . Hyperglycemia  . CVA (cerebral infarction)    Past Medical History:  Past Medical History  Diagnosis Date  . Diabetes mellitus   . GERD (gastroesophageal reflux disease)   . Hypertension   . Diverticulitis   . Gout   . Essential and other specified forms of tremor   . Dementia   . CAD (coronary artery disease)   . CHF (congestive heart failure)     Diastolic dysfunction   Past Surgical History:  Past Surgical History  Procedure Date  . Dilation and curettage of uterus   . Tumor removal     Assessment & Plan Clinical Impression: Patient is a 79 y.o. year old female with recent admission to the hospital on 06/08/2012 after being found down questionable seizure frothing from the mouth. MRI of the brain showed subcentimeter solitary acute infarct right parietal gray matter junction without associated hemorrhage as was advanced atrophy. 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 Echocardiogram with ejection fraction of 55% and normal systolic function. Carotid Dopplers with right 40-59% ICA stenosis.  Patient transferred to CIR on 06/16/2012 .      Patient currently requires mod with basic self-care skills secondary to muscle weakness and impaired timing and sequencing, unbalanced muscle activation and decreased coordination.  Prior to hospitalization, patient could complete ADLS with supervision to modified independent.  Patient will benefit from skilled intervention to decrease level of assist with basic self-care skills and increase independence with basic self-care skills prior to discharge home with family.  Anticipate patient will require 24 hour supervision and follow up home health.  OT - End of Session Activity Tolerance: Tolerates 10 - 20 min activity with multiple rests Endurance Deficit: Yes Endurance Deficit Description: dyspnea 2/4 after attempting to donn socks from wheelchair level OT Assessment Rehab Potential: Excellent Barriers to Discharge: None OT Plan OT Intensity: Minimum of 1-2 x/day, 45 to 90 minutes OT Frequency: 5 out of 7 days OT Duration/Estimated Length of Stay: 10-14 days OT Treatment/Interventions: Balance/vestibular training;Community reintegration;DME/adaptive equipment instruction;Discharge planning;Functional mobility training;Neuromuscular re-education;Self Care/advanced ADL retraining;Therapeutic Exercise;Therapeutic Activities;Patient/family education;Pain management;UE/LE Coordination activities;UE/LE Strength taining/ROM OT Recommendation Patient destination: Home Follow Up Recommendations: Home health OT Equipment Recommended: Other (comment);Tub/shower bench (may want tub bench)  OT Evaluation Precautions/Restrictions  Precautions Precautions: Fall Restrictions Weight Bearing Restrictions: No   Pain Pain Assessment Pain Assessment: No/denies pain ADL  See FIM  Vision/Perception  Vision - History Baseline Vision: Other (comment) (doesn't have them wth her currently) Visual History: Glaucoma Patient Visual Report: No change from baseline Vision - Assessment Eye Alignment:  Within Functional Limits Vision Assessment: Vision not tested Perception Perception: Within Functional  Limits Praxis Praxis: Intact  Cognition Overall Cognitive Status: Other (comment) (Per chart pt with history of early dementia.) Arousal/Alertness: Awake/alert Orientation Level: Oriented X4 Attention: Sustained Awareness: Impaired (Not aware that she had a CVA, but did say a seizure) Awareness Impairment: Intellectual impairment Sensation Sensation Light Touch: Appears Intact (In bilateral UEs) Light Touch Impaired Details: Absent LLE (toes; diminished lower leg) Stereognosis: Appears Intact Hot/Cold: Appears Intact Proprioception: Appears Intact Coordination Gross Motor Movements are Fluid and Coordinated: No Fine Motor Movements are Fluid and Coordinated: Yes Coordination and Movement Description: Pt with good FM coordination but does exhibit bilateral proximal tremors noted. Heel Shin Test: decreased speed and accuracy LLE Motor  Motor Motor: Abnormal postural alignment and control Mobility  Bed Mobility Bed Mobility: Sit to Supine Rolling Left: 5: Supervision Left Sidelying to Sit: 5: Supervision Supine to Sit: 5: Supervision Sit to Supine: 4: Min assist Transfers Sit to Stand: 4: Min assist Sit to Stand Details: Verbal cues for technique;Manual facilitation for placement Stand to Sit: 3: Mod assist Stand to Sit Details (indicate cue type and reason): Verbal cues for precautions/safety;Verbal cues for technique  Trunk/Postural Assessment  Cervical Assessment Cervical Assessment: Exceptions to [facility] Cervical Strength Overall Cervical Strength Comments: Pt with decreased cervical extension AROM, maintains slight cervical protraction and flexion in sitting and standing. Thoracic Assessment Thoracic Assessment: Within Functional Limits Lumbar Assessment Lumbar Assessment: Exceptions to [name] Lumbar Strength Overall Lumbar Strength Comments: Pt with flexed kyphotic  posturing in standing. Postural Control Postural Control: Within Functional Limits  Balance Balance Balance Assessed: Yes Static Sitting Balance Static Sitting - Balance Support: No upper extremity supported Static Sitting - Level of Assistance: 5: Stand by assistance Static Standing Balance Static Standing - Balance Support: Right upper extremity supported;Left upper extremity supported Static Standing - Level of Assistance: 4: Min assist Static Standing - Comment/# of Minutes: 2 minutes Dynamic Standing Balance Dynamic Standing - Level of Assistance: 3: Mod assist Extremity/Trunk Assessment RUE Assessment RUE Assessment: Within Functional Limits (overall strength 4/5 throughout) LUE Assessment LUE Assessment: Within Functional Limits (overall strength 4/5 throughout)  See FIM for current functional status Refer to Care Plan for Long Term Goals  Recommendations for other services: None  Discharge Criteria: Patient will be discharged from OT if patient refuses treatment 3 consecutive times without medical reason, if treatment goals not met, if there is a change in medical status, if patient makes no progress towards goals or if patient is discharged from hospital.  The above assessment, treatment plan, treatment alternatives and goals were discussed and mutually agreed upon: by patient  Began education on selfcare re-training at sink level during session including safety, sequencing, dynamic balance, and functional mobility.  Also worked on toilet transfers and toileting as well.    Yasmene Salomone OTR/L 06/17/2012, 12:34 PM

## 2012-06-17 NOTE — Care Management Note (Signed)
[facility] Individual Statement of Services  Patient Name:  Glenda Gonzalez  Date:  06/17/2012  Welcome to the [facility].  Our goal is to provide you with an individualized program based on your diagnosis and situation, designed to meet your specific needs.  With this comprehensive rehabilitation program, you will be expected to participate in at least 3 hours of rehabilitation therapies Monday-Friday, with modified therapy programming on the weekends.  Your rehabilitation program will include the following services:  Physical Therapy (PT), Occupational Therapy (OT), Speech Therapy (ST), 24 hour per day rehabilitation nursing, Case Management (RN and Social Worker), Rehabilitation Medicine, Nutrition Services and Pharmacy Services  Weekly team conferences will be held on Wednesday to discuss your progress.  Your RN Case Designer, television/film set will talk with you frequently to get your input and to update you on team discussions.  Team conferences with you and your family in attendance may also be held.  Expected length of stay: 10-14 days Overall anticipated outcome: Supervision/min level  Depending on your progress and recovery, your program may change.  Your RN Case Estate agent will coordinate services and will keep you informed of any changes.  Your RN Sports coach and SW names and contact numbers are listed  below.  The following services may also be recommended but are not provided by the [facility]:   Driving Evaluations  Home Health Rehabiltiation Services  Outpatient Rehabilitatation [facility]  Vocational Rehabilitation   Arrangements will be made to provide these services after discharge if needed.  Arrangements include referral to agencies that provide these services.  Your insurance has been verified to be:  UHC-Medicare & tricare Your primary doctor is:  Dr. Billee Cashing  Pertinent  information will be shared with your doctor and your insurance company.    Social Worker:  Dossie Der, [place] [phone]  Information discussed with and copy given to patient by: Lucy Chris, 06/17/2012, 10:26 AM

## 2012-06-17 NOTE — Progress Notes (Signed)
Social Work Assessment and Plan Social Work Assessment and Plan  Patient Details  Name: Glenda Gonzalez MRN: [id number] Date of Birth: [birth year]  Today's Date: 06/17/2012  Problem List:  Patient Active Problem List  Diagnosis  . DM  . GOUT  . DEMENTIA, MILD  . TREMOR, ESSENTIAL  . HYPERTENSION  . CAD  . CONGESTIVE HEART FAILURE UNSPECIFIED  . CLAUDICATION  . GERD  . OSTEOARTHRITIS  . Generalized weakness  . Fall  . Hypokalemia  . Hyponatremia  . Acute diarrhea  . C. difficile colitis  . UTI (lower urinary tract infection)  . Hypoglycemia  . Acute encephalopathy  . Acute respiratory failure  . Status epilepticus  . Hyperglycemia  . CVA (cerebral infarction)   Past Medical History:  Past Medical History  Diagnosis Date  . Diabetes mellitus   . GERD (gastroesophageal reflux disease)   . Hypertension   . Diverticulitis   . Gout   . Essential and other specified forms of tremor   . Dementia   . CAD (coronary artery disease)   . CHF (congestive heart failure)     Diastolic dysfunction   Past Surgical History:  Past Surgical History  Procedure Date  . Dilation and curettage of uterus   . Tumor removal    Social History:  reports that she has never smoked. She has never used smokeless tobacco. She reports that she does not drink alcohol or use illicit drugs.  Family / Support Systems Marital Status: Married How Long?: 55 years Patient Roles: Spouse;Parent Spouse/Significant Other: Alinda Money  [phone]  [phone] Children: Paul-son  [phone] Other Supports: Other four children Anticipated Caregiver: Husband and daughter who have provided care PTA Ability/Limitations of Caregiver: Can provide 24 hr care-min most Caregiver Availability: 24/7 Family Dynamics: Very close family who are there for one another and assist with their Mother's care.  Pt feels very fortunante to have them, they all look out for one another.  She is never alone, between  husband and daughter.  Social History Preferred language: English Religion: Baptist Cultural Background: No issues Education: [employer] Read: Yes Write: Yes Employment Status: Retired Fish farm manager Issues: No issues Guardian/Conservator: None-according to MD pt is capable of making her own decisions, but she wants to include husband.  Will abide by her wishes   Abuse/Neglect Physical Abuse: Denies Verbal Abuse: Denies Sexual Abuse: Denies Exploitation of patient/patient's resources: Denies Self-Neglect: Denies  Emotional Status Pt's affect, behavior adn adjustment status: Pt is motivated but wants to get home as soon as possible.  She reports: " There is no place like home."  She has never been on rehab before, but wants to get stronger and build up her endurance here. Recent Psychosocial Issues: other medical issues Pyschiatric History: No history-depression screen score-2.  Pt feels she is much better than on acute.  She is looking forward to getting back home.  She has a strong faith and relies upon this to get her through difficult times. Substance Abuse History: No issues  Patient / Family Perceptions, Expectations & Goals Pt/Family understanding of illness & functional limitations: Pt is able to explain her hosptialization, she has been told by her family how long she has been here and what has been done.  Family is in contact with MD and aware of her condition. Premorbid pt/family roles/activities: Wife, Mother, retiree, grandmother, Church member, etc Anticipated changes in roles/activities/participation: resume Pt/family expectations/goals: Pt states: " I want to get stronger and get home soon."  Husband  states: " We are hopeful she will do well here and get her back home."  [employer]: Other (Comment) (had in the past) Premorbid Home Care/DME Agencies: Other (Comment) (had in the past) Transportation available at discharge:  Family Resource referrals recommended: Support group (specify) (CVA Support Group)  Discharge Planning Living Arrangements: Spouse/significant other Support Systems: Spouse/significant other;Children;Friends/neighbors;Church/faith community Type of Residence: Private residence Insurance Resources: Media planner (specify) (UHC-Medicare & Tricare) Surveyor, quantity Resources: Tree surgeon;Family Support Financial Screen Referred: No Living Expenses: Lives with family Money Management: Spouse Do you have any problems obtaining your medications?: No Home Management: Family members Patient/Family Preliminary Plans: Return home with husband and children assisting.  Pt is never alone, when husband works he drops her off at their daughter's and then picks her up when done.  Will make sure someone is always with her. Social Work Anticipated Follow Up Needs: HH/OP;Support Group  Clinical Impression Pleasant female who is willing to work and still processing all that has happened to her since admission here.  Her family is very dedicated and supportive and will provide the care that she needs At discharge.  Needs to get stronger and build her endurance while here.  Lucy Chris 06/17/2012, 1:09 PM

## 2012-06-17 NOTE — Evaluation (Addendum)
Physical Therapy Assessment and Plan  Patient Details  Name: CAROLANN BRAZELL MRN: [id number] Date of Birth: 03-Nov-1933  PT Diagnosis: Difficulty walking, Hemiparesis non-dominant, Impaired cognition, Impaired sensation and Muscle weakness Rehab Potential:good   ELOS:12-14 days     Today's Date: 06/17/2012 Time: 0905-1006 Time Calculation (min): 61 min  Problem List:  Patient Active Problem List  Diagnosis  . DM  . GOUT  . DEMENTIA, MILD  . TREMOR, ESSENTIAL  . HYPERTENSION  . CAD  . CONGESTIVE HEART FAILURE UNSPECIFIED  . CLAUDICATION  . GERD  . OSTEOARTHRITIS  . Generalized weakness  . Fall  . Hypokalemia  . Hyponatremia  . Acute diarrhea  . C. difficile colitis  . UTI (lower urinary tract infection)  . Hypoglycemia  . Acute encephalopathy  . Acute respiratory failure  . Status epilepticus  . Hyperglycemia  . CVA (cerebral infarction)    Past Medical History:  Past Medical History  Diagnosis Date  . Diabetes mellitus   . GERD (gastroesophageal reflux disease)   . Hypertension   . Diverticulitis   . Gout   . Essential and other specified forms of tremor   . Dementia   . CAD (coronary artery disease)   . CHF (congestive heart failure)     Diastolic dysfunction   Past Surgical History:  Past Surgical History  Procedure Date  . Dilation and curettage of uterus   . Tumor removal     Assessment & Plan Clinical Impression: APPOLONIA ACKERT is a 78 y.o. right-handed female with history of diabetes mellitus with peripheral neuropathy, dementia, coronary artery disease. Admitted 06/08/2012 after being found down questionable seizure frothing from the mouth. MRI of the brain showed subcentimeter solitary acute infarct right parietal gray matter junction without associated hemorrhage as was advanced atrophy. 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 Echocardiogram with ejection fraction of 55% and normal  systolic function. Carotid Dopplers with right 40-59% ICA stenosis. EEG showed no seizure activity. Neurology consulted patient was loaded with Keppra for seizure. Aspirin was added for CVA prophylaxis. Subcutaneous heparin for DVT prophylaxis. Patient did require intubation for airway protection and was extubated 06/10/2012 without difficulty.  Patient transferred to CIR on 06/16/2012 .   Patient currently requires +2 assist with mobility secondary to muscle weakness and muscle joint tightness and impaired timing and sequencing and decreased coordination.  Prior to hospitalization, patient was supervision with mobility and lived with husband adult children Spouse;Son in a single level House home.  Home access is via ramp  Ramped entrance.  Patient will benefit from skilled PT intervention to maximize safe functional mobility, minimize fall risk and decrease caregiver burden for planned discharge home with 24 hour supervision.  Anticipate patient will benefit from follow up HH at discharge.  PT - End of Session Endurance Deficit: Yes  PT Evaluation Precautions/Restrictions Precautions Precautions: Fall Restrictions Weight Bearing Restrictions: No   Pain Pain Assessment Pain Assessment: No/denies pain Home Living/Prior Functioning Home Living Lives With: Spouse;Son Available Help at Discharge: Family (son works during the day) Type of Home: House Home Access: Ramped entrance Home Layout: One level Bathroom Shower/Tub:  (sponge baths only) Bathroom Toilet: Standard Home Adaptive Equipment: Bedside commode/3-in-1;Walker - rolling;Straight cane Prior Function Level of Independence: Needs assistance with ADLs;Needs assistance with homemaking Bath: Moderate Toileting: Maximal Meal Prep: Total Light Housekeeping: Total Able to Take Stairs?: No Driving: No Vocation: Retired Leisure: Hobbies-yes (Comment) Comments: reading, TV Vision/Perception  Vision -  History Baseline Vision:  Other (comment) (doesn't have them wth her currently) Visual History: Glaucoma Patient Visual Report: No change from baseline Vision - Assessment Eye Alignment: Within Functional Limits Vision Assessment: Vision not tested Perception Perception: Within Functional Limits Praxis Praxis: Intact  Cognition Overall Cognitive Status: Other (comment) (Per chart pt with history of early dementia.) Arousal/Alertness: Awake/alert Orientation Level: Oriented X4 Attention: Sustained Awareness: Impaired (Not aware that she had a CVA, but did say a seizure) Awareness Impairment: Intellectual impairment Sensation Sensation Light Touch: Impaired Detail Light Touch Impaired Details: Absent LLE (toes; diminished lower leg) Stereognosis: Appears Intact Hot/Cold: Appears Intact Proprioception: Appears Intact Coordination Gross Motor Movements are Fluid and Coordinated: No Fine Motor Movements are Fluid and Coordinated: Yes Coordination and Movement Description: RUE tremor Heel Shin Test: decreased speed and accuracy LLE Motor  Motor Motor: Abnormal postural alignment and control;Hemiplegia  Mobility Bed Mobility Bed Mobility: Sit to Supine Sit to Supine: 4: Min assist Locomotion  Ambulation Ambulation/Gait Assistance: 4: Min assist  Trunk/Postural Assessment  Cervical Assessment Cervical Assessment: Exceptions to [facility] Cervical Strength Overall Cervical Strength Comments: Pt with decreased cervical extension AROM, maintains slight cervical protraction and flexion in sitting and standing. Lumbar Assessment Lumbar Assessment: Exceptions to [facility] Lumbar Strength Overall Lumbar Strength Comments: Pt with flexed kyphotic posturing in standing.  Balance Standardized Balance Assessment Standardized Balance Assessment: Berg Balance Test Berg Balance Test Sit to Stand: Needs minimal aid to stand or to stabilize Standing Unsupported: Able to stand 2 minutes with supervision Sitting with Back  Unsupported but Feet Supported on Floor or Stool: Able to sit safely and securely 2 minutes Stand to Sit: Uses backs of legs against chair to control descent Transfers: Needs one person to assist Standing Unsupported with Eyes Closed: Able to stand 10 seconds with supervision Standing Ubsupported with Feet Together: Able to place feet together independently but unable to hold for 30 seconds From Standing, Reach Forward with Outstretched Arm: Can reach forward >5 cm safely (2") From Standing Position, Pick up Object from Floor: Able to pick up shoe, needs supervision From Standing Position, Turn to Look Behind Over each Shoulder: Turn sideways only but maintains balance Turn 360 Degrees: Needs assistance while turning Standing Unsupported, Alternately Place Feet on Step/Stool: Needs assistance to keep from falling or unable to try Standing Unsupported, One Foot in Front: Needs help to step but can hold 15 seconds Standing on One Leg: Unable to try or needs assist to prevent fall Total Score: 24  Static Sitting Balance Static Sitting - Balance Support: No upper extremity supported Static Sitting - Level of Assistance: 5: Stand by assistance Static Standing Balance Static Standing - Balance Support: Right upper extremity supported;Left upper extremity supported Static Standing - Level of Assistance: 4: Min assist Dynamic Standing Balance Dynamic Standing - Level of Assistance: 3: Mod assist Extremity Assessment  RLE Assessment RLE Assessment: Exceptions to [facility] (tight heel cord and hamstring); minimal edeam RLE Strength RLE Overall Strength Comments: hip grossly 4/5; knee flex/ext and ankle DF 4+/5 LLE Assessment LLE Assessment: Exceptions to [facility] (tight heel cord and hamstrings; old L ankle fx; moderate edema) LLE Strength LLE Overall Strength Comments: hip and knee grossly 4/5; ankle DF 3+/5  FIM:  FIM - Banker Devices: Environmental consultant;Arm  rests Bed/Chair Transfer: 3: Bed > Chair or W/C: Mod A (lift or lower assist);5: Supine > Sit: Supervision (verbal cues/safety issues) FIM - Locomotion: Wheelchair Locomotion: Wheelchair: 1: Travels less than 50 ft with minimal assistance (  Pt.>75%) FIM - Locomotion: Ambulation Locomotion: Ambulation Assistive Devices: Designer, industrial/product Ambulation/Gait Assistance: 4: Min assist Locomotion: Ambulation: 1: Two helpers FIM - Locomotion: Stairs Locomotion: Stairs: 1: Two helpers   Refer to [employer] for Long Term Goals  Recommendations for other services: None  Discharge Criteria: Patient will be discharged from PT if patient refuses treatment 3 consecutive times without medical reason, if treatment goals not met, if there is a change in medical status, if patient makes no progress towards goals or if patient is discharged from hospital.  The above assessment, treatment plan, treatment alternatives and goals were discussed and mutually agreed upon: by patient  Treatment today:  Toilet transfers with wall bar onto regular toilet to R, max assist focusing on bringing hips over toilet, foot placement.  Static sitting balance on toilet x 8 minutes with distant supervision.  Total assist toileting for B and B.  Lonzie Simmer 06/17/2012, 2:06 PM

## 2012-06-17 NOTE — Evaluation (Signed)
Speech Language Pathology Assessment and Plan  Patient Details  Name: Glenda Gonzalez MRN: [id number] Date of Birth: Jul 30, 1933  SLP Diagnosis: none Rehab Potential: Good ELOS: defer to OT/PT  Today's Date: 06/17/2012 Time: 1445-1530 Time Calculation (min): 45 min  Problem List:  Patient Active Problem List  Diagnosis  . DM  . GOUT  . DEMENTIA, MILD  . TREMOR, ESSENTIAL  . HYPERTENSION  . CAD  . CONGESTIVE HEART FAILURE UNSPECIFIED  . CLAUDICATION  . GERD  . OSTEOARTHRITIS  . Generalized weakness  . Fall  . Hypokalemia  . Hyponatremia  . Acute diarrhea  . C. difficile colitis  . UTI (lower urinary tract infection)  . Hypoglycemia  . Acute encephalopathy  . Acute respiratory failure  . Status epilepticus  . Hyperglycemia  . CVA (cerebral infarction)   Past Medical History:  Past Medical History  Diagnosis Date  . Diabetes mellitus   . GERD (gastroesophageal reflux disease)   . Hypertension   . Diverticulitis   . Gout   . Essential and other specified forms of tremor   . Dementia   . CAD (coronary artery disease)   . CHF (congestive heart failure)     Diastolic dysfunction   Past Surgical History:  Past Surgical History  Procedure Date  . Dilation and curettage of uterus   . Tumor removal     Assessment / Plan / Recommendation Clinical Impression  79 y.o. right-handed female with history of diabetes mellitus with peripheral neuropathy, dementia, coronary artery disease. Admitted 06/08/2012 after being found down questionable seizure frothing from the mouth. MRI of the brain showed subcentimeter solitary acute infarct right parietal gray matter junction without associated hemorrhage as was advanced atrophy. 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 Echocardiogram with ejection fraction of 55% and normal systolic function. Carotid Dopplers with right 40-59% ICA stenosis. EEG showed no seizure activity.  Neurology consulted patient was loaded with Keppra for seizure. Aspirin was added for CVA prophylaxis. Subcutaneous heparin for DVT prophylaxis. Patient did require intubation for airway protection and was extubated 06/10/2012 without difficulty; mild pharyngeal dysphagia; dysphagia goals discharged during acute stay.  Patient transferred to [facility] 06/16/12. Upon evaluation today patient presents with functional cognitive-linguistic abilities. Patient reports that she is like herself but worries that she cannot remember things like the seizure or intubation. SLP educated patient regarding inability to form memories when people are not awake and alert. Patient reported writing things down PTA and SLP encouraged her to continue to do this. Despite family not being present to confirm, SLP suspects patient is close to baseline and as a result no skilled SLP services are warranted at this time.            SLP Assessment  Patient will need skilled Speech Lanaguage Pathology Services during CIR admission    Recommendations  Patient destination: Home Follow up Recommendations: None Equipment Recommended: None recommended by SLP           Pain Pain Assessment Pain Assessment: No/denies pain Prior Functioning Cognitive/Linguistic Baseline: Baseline deficits Baseline deficit details: mild memory deficits Type of Home: House Lives With: Spouse;Son Available Help at Discharge: Family (son works during the day) Vocation: Retired  See FIM for current functional status  Recommendations for other services: None  Discharge Criteria: Patient will be discharged from SLP if patient refuses treatment 3 consecutive times without medical reason, if treatment goals not met, if there is a  change in medical status, if patient makes no progress towards goals or if patient is discharged from hospital.  The above assessment, treatment plan, treatment alternatives and goals were discussed and  mutually agreed upon: by patient  Charlane Ferretti., CCC-SLP [phone]  [name] 06/17/2012, 4:51 PM

## 2012-06-17 NOTE — Progress Notes (Signed)
Patient ID: Glenda Gonzalez, female   DOB: Jan 16, 1934, 79 y.o.   MRN: [id number] 79 y.o. right-handed female with history of diabetes mellitus with peripheral neuropathy, dementia, coronary artery disease. Admitted 06/08/2012 after being found down questionable seizure frothing from the mouth. MRI of the brain showed subcentimeter solitary acute infarct right parietal gray matter junction without associated hemorrhage as was advanced atrophy. 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 Echocardiogram with ejection fraction of 55% and normal systolic function. Carotid Dopplers with right 40-59% ICA stenosis. EEG showed no seizure activity. Neurology consulted patient was loaded with Keppra for seizure. Aspirin was added for CVA prophylaxis. Subcutaneous heparin for DVT prophylaxis. Patient did require intubation for airway protection and was extubated 06/10/2012 without difficulty  Subjective/Complaints: No problems overnight. No pain complaints  Review of Systems  Neurological: Positive for focal weakness. Negative for seizures.  All other systems reviewed and are negative.    Objective: Vital Signs: Blood pressure 145/79, pulse 84, temperature 98.4 F (36.9 C), temperature source Oral, resp. rate 20, SpO2 95.00%. No results found. Results for orders placed during the hospital encounter of 06/16/12 (from the past 72 hour(s))  CBC     Status: Abnormal   Collection Time   06/16/12  4:35 PM      Component Value Range Comment   WBC 6.0  4.0 - 10.5 K/uL    RBC 3.63 (*) 3.87 - 5.11 MIL/uL    Hemoglobin 10.0 (*) 12.0 - 15.0 g/dL    HCT 40.9 (*) 81.1 - 46.0 %    MCV 84.3  78.0 - 100.0 fL    MCH 27.5  26.0 - 34.0 pg    MCHC 32.7  30.0 - 36.0 g/dL    RDW 91.4 (*) 78.2 - 15.5 %    Platelets 229  150 - 400 K/uL   CREATININE, SERUM     Status: Abnormal   Collection Time   06/16/12  4:35 PM      Component Value Range Comment   Creatinine, Ser  1.17 (*) 0.50 - 1.10 mg/dL    GFR calc non Af Amer 44 (*) >90 mL/min    GFR calc Af Amer 51 (*) >90 mL/min   GLUCOSE, CAPILLARY     Status: Abnormal   Collection Time   06/16/12  4:49 PM      Component Value Range Comment   Glucose-Capillary 238 (*) 70 - 99 mg/dL   GLUCOSE, CAPILLARY     Status: Abnormal   Collection Time   06/16/12  8:12 PM      Component Value Range Comment   Glucose-Capillary 203 (*) 70 - 99 mg/dL   CBC WITH DIFFERENTIAL     Status: Abnormal   Collection Time   06/17/12  6:15 AM      Component Value Range Comment   WBC 6.7  4.0 - 10.5 K/uL    RBC 3.93  3.87 - 5.11 MIL/uL    Hemoglobin 10.8 (*) 12.0 - 15.0 g/dL    HCT 95.6 (*) 21.3 - 46.0 %    MCV 84.0  78.0 - 100.0 fL    MCH 27.5  26.0 - 34.0 pg    MCHC 32.7  30.0 - 36.0 g/dL    RDW 08.6 (*) 57.8 - 15.5 %    Platelets 244  150 - 400 K/uL    Neutrophils Relative 54  43 - 77 %    Neutro Abs 3.6  1.7 -  7.7 K/uL    Lymphocytes Relative 35  12 - 46 %    Lymphs Abs 2.4  0.7 - 4.0 K/uL    Monocytes Relative 6  3 - 12 %    Monocytes Absolute 0.4  0.1 - 1.0 K/uL    Eosinophils Relative 4  0 - 5 %    Eosinophils Absolute 0.3  0.0 - 0.7 K/uL    Basophils Relative 0  0 - 1 %    Basophils Absolute 0.0  0.0 - 0.1 K/uL   COMPREHENSIVE METABOLIC PANEL     Status: Abnormal   Collection Time   06/17/12  6:15 AM      Component Value Range Comment   Sodium 135  135 - 145 mEq/L    Potassium 4.2  3.5 - 5.1 mEq/L    Chloride 99  96 - 112 mEq/L    CO2 21  19 - 32 mEq/L    Glucose, Bld 203 (*) 70 - 99 mg/dL    BUN 24 (*) 6 - 23 mg/dL    Creatinine, Ser 1.61 (*) 0.50 - 1.10 mg/dL    Calcium 9.4  8.4 - 09.6 mg/dL    Total Protein 7.1  6.0 - 8.3 g/dL    Albumin 2.8 (*) 3.5 - 5.2 g/dL    AST 34  0 - 37 U/L    ALT 18  0 - 35 U/L    Alkaline Phosphatase 176 (*) 39 - 117 U/L    Total Bilirubin 0.2 (*) 0.3 - 1.2 mg/dL    GFR calc non Af Amer 44 (*) >90 mL/min    GFR calc Af Amer 51 (*) >90 mL/min   GLUCOSE, CAPILLARY      Status: Abnormal   Collection Time   06/17/12  7:07 AM      Component Value Range Comment   Glucose-Capillary 171 (*) 70 - 99 mg/dL    Comment 1 Notify RN     GLUCOSE, CAPILLARY     Status: Abnormal   Collection Time   06/17/12 11:27 AM      Component Value Range Comment   Glucose-Capillary 280 (*) 70 - 99 mg/dL    Comment 1 Notify RN        Pt is obese  HENT: oral mucosa pink and moist  Head: Normocephalic.  Eyes:  Pupils reactive to light without nystagmus . EOMI grossly intact  Neck: Neck supple. No thyromegaly present. No jvd. No LAD  Cardiovascular: Normal rate and regular rhythm. No murmur  Pulmonary/Chest:  Decreased breath sounds with improved effort and clear to auscultation  Abdominal: Soft. Bowel sounds are normal. She exhibits no distension.  Neurological: She is alert.  Patient made good eye contact with examiner.Marland Kitchen Speech clear. Mild left facial weakness. She was able to give her name as well as place but had difficulty with age. She provided biographical information. Follows one and two step commands. Slower with left hand. Intentional tremor bilaterally. Strength 3/5 UE in deltoid, biceps, triceps, and HI and 2 prox LE HF and KE to 3/4 with ADF and APF . No gross sensory deficits although both legs were hypersensitive to touch below the knees to feet.  Skin: Skin is warm and dry   Assessment/Plan: 1. Functional deficits secondary to Right parietal thrombotic infarct which require 3+ hours per day of interdisciplinary therapy in a comprehensive inpatient rehab setting. Physiatrist is providing close team supervision and 24 hour management of active medical problems listed below. Physiatrist and rehab  team continue to assess barriers to discharge/monitor patient progress toward functional and medical goals. FIM: FIM - Bathing Bathing Steps Patient Completed: Right Arm;Chest;Left Arm;Front perineal area;Abdomen;Buttocks;Right upper leg;Left upper leg Bathing: 4:  Min-Patient completes 8-9 68f 10 parts or 75+ percent  FIM - Upper Body Dressing/Undressing Upper body dressing/undressing steps patient completed: Thread/unthread right sleeve of pullover shirt/dresss;Thread/unthread left sleeve of pullover shirt/dress;Put head through opening of pull over shirt/dress;Pull shirt over trunk Upper body dressing/undressing: 5: Set-up assist to: Obtain clothing/put away FIM - Lower Body Dressing/Undressing Lower body dressing/undressing steps patient completed: Thread/unthread right underwear leg;Thread/unthread left underwear leg;Thread/unthread right pants leg;Thread/unthread left pants leg;Pull pants up/down;Pull underwear up/down;Don/Doff left sock Lower body dressing/undressing: 3: Mod-Patient completed 50-74% of tasks  FIM - Toileting Toileting steps completed by patient: Adjust clothing prior to toileting;Performs perineal hygiene;Adjust clothing after toileting Toileting Assistive Devices: Grab bar or rail for support Toileting: 5: Supervision: Safety issues/verbal cues  FIM - Diplomatic Services operational officer Devices: Grab bars Toilet Transfers: 2-To toilet/BSC: Max A (lift and lower assist)  FIM - Banker Devices: Walker;Arm rests Bed/Chair Transfer: 3: Bed > Chair or W/C: Mod A (lift or lower assist);5: Supine > Sit: Supervision (verbal cues/safety issues)  FIM - Locomotion: Wheelchair Locomotion: Wheelchair: 1: Travels less than 50 ft with minimal assistance (Pt.>75%) FIM - Locomotion: Ambulation Locomotion: Ambulation Assistive Devices: Designer, industrial/product Ambulation/Gait Assistance: 4: Min assist Locomotion: Ambulation: 1: Two helpers  Comprehension Comprehension Mode: Auditory Comprehension: 6-Follows complex conversation/direction: With extra time/assistive device  Expression Expression Mode: Verbal Expression: 6-Expresses complex ideas: With extra time/assistive device  Social  Interaction Social Interaction: 6-Interacts appropriately with others with medication or extra time (anti-anxiety, antidepressant).  Problem Solving Problem Solving: 6-Solves complex problems: With extra time  Memory Memory: 6-More than reasonable amt of time  Medical Problem List and Plan:  1. Thrombotic right parietal infarct/seizure disorder  2. DVT Prophylaxis/Anticoagulation: Subcutaneous heparin. Monitor platelet counts and any signs of bleeding  3. Mood/dementia. Aricept 10 mg daily. Will discuss baseline cognition with family  4. Neuropsych: This patient is capable of making decisions on his/her own behalf.  5. Seizure disorder. EEG negative. Keppra 500 mg twice a day. Monitor for any seizure activity  6. Diabetes mellitus with peripheral neuropathy. Lantus insulin 10 units daily. Hemoglobin A1c pending. Check blood sugars a.c. and at bedtime. Patient on diet control prior to admission  7. Hypertension. Lotensin 5 mg daily, labetalol 100 mg twice a day. Monitor with increased activity  8. Diastolic congestive heart failure. Monitor for any signs of fluid overload. Patient on Lasix 20 mg daily prior to admission. Will resume as indicated  9. GERD. Protonix   LOS (Days) 1 A FACE TO FACE EVALUATION WAS PERFORMED  Breanda Greenlaw E 06/17/2012, 3:41 PM

## 2012-06-17 NOTE — Progress Notes (Signed)
Patient information reviewed and entered into eRehab system by Yina Riviere, RN, CRRN, PPS Coordinator.  Information including medical coding and functional independence measure will be reviewed and updated through discharge.     Per nursing patient was given "Data Collection Information Summary for Patients in Inpatient Rehabilitation Facilities with attached "Privacy Act Statement-Health Care Records" upon admission.  

## 2012-06-17 NOTE — Progress Notes (Signed)
Inpatient Diabetes Program Recommendations  AACE/ADA: New Consensus Statement on Inpatient Glycemic Control (2013)  Target Ranges:  Prepandial:   less than 140 mg/dL      Peak postprandial:   less than 180 mg/dL (1-2 hours)      Critically ill patients:  140 - 180 mg/dL   Reason for Visit: Hyperglycemia  Results for ARIYANNA, OIEN (MRN [id number]) as of 06/17/2012 09:35  Ref. Range 06/16/2012 05:25  Hemoglobin A1C Latest Range: <5.7 % 11.5 (H)   Results for MUNA, DEMERS (MRN [id number]) as of 06/17/2012 09:35  Ref. Range 06/16/2012 08:41 06/16/2012 11:52 06/16/2012 16:49 06/16/2012 20:12 06/17/2012 07:07  Glucose-Capillary Latest Range: 70-99 mg/dL 469 (H) 629 (H) 528 (H) 203 (H) 171 (H)    Pt transferred to Inpatient Rehab.  Eating 100% this am.  Blood sugars tend to increase throughout the day. HgbA1C results of 11.5% indicate poor control prior to admission.  May benefit from addition of Novolog 3 units tidwc for meal coverage insulin.  Will continue to follow.  Thank you. Ailene Ards, RD, LDN, CDE Inpatient Diabetes Coordinator [phone]

## 2012-06-18 ENCOUNTER — Inpatient Hospital Stay (HOSPITAL_COMMUNITY): Payer: Medicare Other | Admitting: Occupational Therapy

## 2012-06-18 ENCOUNTER — Inpatient Hospital Stay (HOSPITAL_COMMUNITY): Payer: Medicare Other

## 2012-06-18 ENCOUNTER — Encounter: Payer: Self-pay | Admitting: Physical Medicine & Rehabilitation

## 2012-06-18 LAB — GLUCOSE, CAPILLARY
Glucose-Capillary: 155 mg/dL — ABNORMAL HIGH (ref 70–99)
Glucose-Capillary: 210 mg/dL — ABNORMAL HIGH (ref 70–99)
Glucose-Capillary: 205 mg/dL — ABNORMAL HIGH (ref 70–99)

## 2012-06-18 NOTE — Progress Notes (Signed)
Occupational Therapy Session Notes  Patient Details  Name: Glenda Gonzalez MRN: [id number] Date of Birth: 10/18/1933  Today's Date: 06/18/2012 Time: 0800-0900 and 145-230 Time Calculation (min): 60 min and 45 min  Short Term Goals: Week 1:  OT Short Term Goal 1 (Week 1): Short term goals equal LTGs overall at a supervision level.  Skilled Therapeutic Interventions/Progress Updates:  1)  Patient sitting EOB finishing breakfast with son at her side.  Engaged om self care retraining to include sponge bath at EOB per patient request and same context PTA, dressing, toilet transfers and toileting.  Focused session on activity tolerance, adaptive techniques, introduced reacher to improve independence with LB dressing (unsure of patient's ability to be successful with new learning-will continue as patient is able).  Also worked on sit><stands, standing balance and tolerance and sitting balance.  Stand step transfer with RW and increased time ><BSC and patient required assist to be certain she was clean following BM.   Patient was seated on BSC and instructed to remain seated while OT stepped out and patient stated that she would.  Upon return, patient had safely stood up, pulled up her brief and sat on the bed without assistance or use of RW. At end of session patient reported to PT that she needed to change her shirt and did not recall just puttin on the clean shirt with this OT ~20 minutes earlier.   Hand off to PT.   2)  Patient sleeping in bed upon arrival.  Reluctant to get out of bed yet said "I'll try".  Donned TED hose and RN aware that LLE (ankle) with more edema than RLE.  Engaged in bed mobility, donn shoes then was able to tie both shoes by propping foot on stool, ambulate to sink to stand and comb hair, prolled w/c to therapy bathroom to review use of tub bench at home.  Patient will not be using her shower at home until the bathroom is repaired.  If patient showers at discharge, she might use  her daughter's walk in shower occasionally until her bathroom is repaired.  Ambulated with RW in therapy kitchen to explore contents of cabinets and refrigerator.  Patient states, "My family won't let me cook because I loose my balance forward".  When asked if she has forgotten food on the stove cooking, she stated "Yes".  Therapy Documentation Precautions:  Precautions Precautions: Fall Restrictions Weight Bearing Restrictions: No Pain: Pain Assessment Pain Assessment: No/denies pain ADL: See FIM for current functional status  Therapy/Group: Individual Therapy  [name] 06/18/2012, 9:04 AM

## 2012-06-18 NOTE — Progress Notes (Signed)
Physical Therapy Note  Patient Details  Name: Glenda Gonzalez MRN: [id number] Date of Birth: 1934/05/22 Today's Date: 06/18/2012  9:00 - 9:55 55 minutes Individual session Patient denies pain.  Patient finishing dressing upon entering room. Patient sit to stand from bed with supervision to pull up pants. Patient ambulated to sink with rolling walker and supervision to comb hair in standing. Patient ambulated 150 feet with rolling walker on level tile in controlled environment with supervision. Patient takes greater than reasonable time and tends to have walker too far in front. Patient performed bilateral LE exercises to increase strength: 10 reps LAQ's in sitting with 1 1/2# weights; marching in standing with 1 1/2# weights x 10 reps; hip adduction in standing no weight x 10; squats x 10; and ankle dorsi/plantarflexion in standing x 10. Patient ambulated up and down 5 steps with bilateral rails and min steady assist. Patient ambulated 60 feet heading back to room before requesting to sit. Patient transferred to recliner and left with all needed items in reach.  Arelia Longest M 06/18/2012, 12:06 PM

## 2012-06-18 NOTE — Progress Notes (Signed)
Inpatient Diabetes Program Recommendations  AACE/ADA: New Consensus Statement on Inpatient Glycemic Control (2013)  Target Ranges:  Prepandial:   less than 140 mg/dL      Peak postprandial:   less than 180 mg/dL (1-2 hours)      Critically ill patients:  140 - 180 mg/dL   Reason for Visit: CBGs 1/23  [phone] mg/dl       1/61 096-045 mg/dl  Inpatient Diabetes Program Recommendations Insulin - Basal: . Correction (SSI): . Insulin - Meal Coverage: .Add Novolog 3 units meal coverage TID if eating greater than 50% of meals and postprandial blood sugars continue to be greater than 180 mg/dl Oral Agents: .  Note:

## 2012-06-18 NOTE — Progress Notes (Signed)
Physical Therapy Note  Patient Details  Name: Glenda Gonzalez MRN: [id number] Date of Birth: [birth year] Today's Date: 06/18/2012  2:35 - 3:05 30 minutes Individual session Patient denies pain.  Patient performed car transfer with supervision and cueing for safety. Patient requested to try ambulation with cane. Patient ambulated with single point cane 120 feet x 1 and 180 feet x 1 with close supervision - until very end of 180 foot walk. Patient reported feeling "dizzy all of a sudden" and required mod assist to maintain balance and get to bed to sit. Patient reported that dizziness cleared quickly. Patient ambulated to bathroom with rolling walker - incontinent of bowel. Patient independent with clothes up and down - assisted with hygiene.    Arelia Longest M 06/18/2012, 3:42 PM

## 2012-06-19 ENCOUNTER — Inpatient Hospital Stay (HOSPITAL_COMMUNITY): Payer: Medicare Other

## 2012-06-19 ENCOUNTER — Inpatient Hospital Stay (HOSPITAL_COMMUNITY): Payer: Medicare Other | Admitting: Occupational Therapy

## 2012-06-19 ENCOUNTER — Inpatient Hospital Stay (HOSPITAL_COMMUNITY): Payer: Medicare Other | Admitting: Physical Therapy

## 2012-06-19 ENCOUNTER — Encounter (HOSPITAL_COMMUNITY): Payer: Medicare Other | Admitting: Occupational Therapy

## 2012-06-19 DIAGNOSIS — I6330 Cerebral infarction due to thrombosis of unspecified cerebral artery: Secondary | ICD-10-CM

## 2012-06-19 DIAGNOSIS — R569 Unspecified convulsions: Secondary | ICD-10-CM

## 2012-06-19 LAB — GLUCOSE, CAPILLARY: Glucose-Capillary: 180 mg/dL — ABNORMAL HIGH (ref 70–99)

## 2012-06-19 MED ORDER — PANTOPRAZOLE SODIUM 40 MG PO TBEC
40.0000 mg | DELAYED_RELEASE_TABLET | Freq: Two times a day (BID) | ORAL | Status: DC
  Administered 2012-06-19 – 2012-06-26 (×14): 40 mg via ORAL
  Filled 2012-06-19 (×16): qty 1

## 2012-06-19 NOTE — Progress Notes (Signed)
Occupational Therapy Note  Patient Details  Name: Glenda Gonzalez MRN: [id number] Date of Birth: Oct 30, 1933 Today's Date: 06/19/2012  Time In:  14:10  Time Out:  14:45.  No complaints of pain. Patient again complaining of nausea however able to convince patient to work with this therapist.  Treatment focused on bed mobility, bed to wheelchair transfers, functional ambulation with RW (pt ambulated 112 feet with s), standing balance without UE support, standing tolerance, activity tolerance, safety. Patient returned to room with call bell and phone near by;  Encouraged patient to remain out of bed for at least 90 minutes. Patient stated she would try and nursing aware. Pt missed 10 minutes of session due to fatigue.   Norton Pastel 06/19/2012, 2:51 PM

## 2012-06-19 NOTE — Progress Notes (Signed)
Patient ID: GENECIS VELEY, female   DOB: 17-Jun-1933, 79 y.o.   MRN: [id number] 79 y.o. right-handed female with history of diabetes mellitus with peripheral neuropathy, dementia, coronary artery disease. Admitted 06/08/2012 after being found down questionable seizure frothing from the mouth. MRI of the brain showed subcentimeter solitary acute infarct right parietal gray matter junction without associated hemorrhage as was advanced atrophy. 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 Echocardiogram with ejection fraction of 55% and normal systolic function. Carotid Dopplers with right 40-59% ICA stenosis. EEG showed no seizure activity. Neurology consulted patient was loaded with Keppra for seizure. Aspirin was added for CVA prophylaxis. Subcutaneous heparin for DVT prophylaxis. Patient did require intubation for airway protection and was extubated 06/10/2012 without difficulty  Subjective/Complaints: Complains of nausea and abdominal pain since yesterday afternoon. No pain complaints  Review of Systems  Neurological: Positive for focal weakness. Negative for seizures.  All other systems reviewed and are negative.    Objective: Vital Signs: Blood pressure 91/54, pulse 69, temperature 98.5 F (36.9 C), temperature source Oral, resp. rate 18, weight 82.2 kg (181 lb 3.5 oz), SpO2 95.00%. No results found. Results for orders placed during the hospital encounter of 06/16/12 (from the past 72 hour(s))  CBC     Status: Abnormal   Collection Time   06/16/12  4:35 PM      Component Value Range Comment   WBC 6.0  4.0 - 10.5 K/uL    RBC 3.63 (*) 3.87 - 5.11 MIL/uL    Hemoglobin 10.0 (*) 12.0 - 15.0 g/dL    HCT 91.4 (*) 78.2 - 46.0 %    MCV 84.3  78.0 - 100.0 fL    MCH 27.5  26.0 - 34.0 pg    MCHC 32.7  30.0 - 36.0 g/dL    RDW 95.6 (*) 21.3 - 15.5 %    Platelets 229  150 - 400 K/uL   CREATININE, SERUM     Status: Abnormal   Collection Time     06/16/12  4:35 PM      Component Value Range Comment   Creatinine, Ser 1.17 (*) 0.50 - 1.10 mg/dL    GFR calc non Af Amer 44 (*) >90 mL/min    GFR calc Af Amer 51 (*) >90 mL/min   GLUCOSE, CAPILLARY     Status: Abnormal   Collection Time   06/16/12  4:49 PM      Component Value Range Comment   Glucose-Capillary 238 (*) 70 - 99 mg/dL   GLUCOSE, CAPILLARY     Status: Abnormal   Collection Time   06/16/12  8:12 PM      Component Value Range Comment   Glucose-Capillary 203 (*) 70 - 99 mg/dL   CBC WITH DIFFERENTIAL     Status: Abnormal   Collection Time   06/17/12  6:15 AM      Component Value Range Comment   WBC 6.7  4.0 - 10.5 K/uL    RBC 3.93  3.87 - 5.11 MIL/uL    Hemoglobin 10.8 (*) 12.0 - 15.0 g/dL    HCT 08.6 (*) 57.8 - 46.0 %    MCV 84.0  78.0 - 100.0 fL    MCH 27.5  26.0 - 34.0 pg    MCHC 32.7  30.0 - 36.0 g/dL    RDW 46.9 (*) 62.9 - 15.5 %    Platelets 244  150 - 400 K/uL    Neutrophils Relative  54  43 - 77 %    Neutro Abs 3.6  1.7 - 7.7 K/uL    Lymphocytes Relative 35  12 - 46 %    Lymphs Abs 2.4  0.7 - 4.0 K/uL    Monocytes Relative 6  3 - 12 %    Monocytes Absolute 0.4  0.1 - 1.0 K/uL    Eosinophils Relative 4  0 - 5 %    Eosinophils Absolute 0.3  0.0 - 0.7 K/uL    Basophils Relative 0  0 - 1 %    Basophils Absolute 0.0  0.0 - 0.1 K/uL   COMPREHENSIVE METABOLIC PANEL     Status: Abnormal   Collection Time   06/17/12  6:15 AM      Component Value Range Comment   Sodium 135  135 - 145 mEq/L    Potassium 4.2  3.5 - 5.1 mEq/L    Chloride 99  96 - 112 mEq/L    CO2 21  19 - 32 mEq/L    Glucose, Bld 203 (*) 70 - 99 mg/dL    BUN 24 (*) 6 - 23 mg/dL    Creatinine, Ser 1.61 (*) 0.50 - 1.10 mg/dL    Calcium 9.4  8.4 - 09.6 mg/dL    Total Protein 7.1  6.0 - 8.3 g/dL    Albumin 2.8 (*) 3.5 - 5.2 g/dL    AST 34  0 - 37 U/L    ALT 18  0 - 35 U/L    Alkaline Phosphatase 176 (*) 39 - 117 U/L    Total Bilirubin 0.2 (*) 0.3 - 1.2 mg/dL    GFR calc non Af Amer 44 (*) >90  mL/min    GFR calc Af Amer 51 (*) >90 mL/min   GLUCOSE, CAPILLARY     Status: Abnormal   Collection Time   06/17/12  7:07 AM      Component Value Range Comment   Glucose-Capillary 171 (*) 70 - 99 mg/dL    Comment 1 Notify RN     GLUCOSE, CAPILLARY     Status: Abnormal   Collection Time   06/17/12 11:27 AM      Component Value Range Comment   Glucose-Capillary 280 (*) 70 - 99 mg/dL    Comment 1 Notify RN     GLUCOSE, CAPILLARY     Status: Abnormal   Collection Time   06/17/12  4:37 PM      Component Value Range Comment   Glucose-Capillary 217 (*) 70 - 99 mg/dL   GLUCOSE, CAPILLARY     Status: Abnormal   Collection Time   06/17/12  9:25 PM      Component Value Range Comment   Glucose-Capillary 220 (*) 70 - 99 mg/dL    Comment 1 Notify RN     GLUCOSE, CAPILLARY     Status: Abnormal   Collection Time   06/18/12  7:01 AM      Component Value Range Comment   Glucose-Capillary 155 (*) 70 - 99 mg/dL    Comment 1 Notify RN     GLUCOSE, CAPILLARY     Status: Abnormal   Collection Time   06/18/12 11:30 AM      Component Value Range Comment   Glucose-Capillary 210 (*) 70 - 99 mg/dL   GLUCOSE, CAPILLARY     Status: Abnormal   Collection Time   06/18/12  4:38 PM      Component Value Range Comment   Glucose-Capillary 205 (*) 70 -  99 mg/dL   GLUCOSE, CAPILLARY     Status: Abnormal   Collection Time   06/18/12  9:25 PM      Component Value Range Comment   Glucose-Capillary 157 (*) 70 - 99 mg/dL   GLUCOSE, CAPILLARY     Status: Abnormal   Collection Time   06/19/12  7:12 AM      Component Value Range Comment   Glucose-Capillary 204 (*) 70 - 99 mg/dL      Pt is obese  HENT: oral mucosa pink and moist  Head: Normocephalic.  Eyes:  Pupils reactive to light without nystagmus . EOMI grossly intact  Neck: Neck supple. No thyromegaly present. No jvd. No LAD  Cardiovascular: Normal rate and regular rhythm. No murmur  Pulmonary/Chest:  Decreased breath sounds with improved effort and clear  to auscultation  Abdominal: Soft. Bowel sounds are normal. No distention. Abdomen tender to palpation LUQ more than RUQ. Neurological: She is alert.  Patient made good eye contact with examiner.Marland Kitchen Speech clear. Mild left facial weakness. She was able to give her name as well as place but had difficulty with age. She provided biographical information. Follows one and two step commands. Slower with left hand. Intentional tremor bilaterally. Strength 3/5 UE in deltoid, biceps, triceps, and HI and 2 prox LE HF and KE to 3/4 with ADF and APF . No gross sensory deficits although both legs were hypersensitive to touch below the knees to feet.  Skin: Skin is warm and dry   Assessment/Plan: 1. Functional deficits secondary to Right parietal thrombotic infarct which require 3+ hours per day of interdisciplinary therapy in a comprehensive inpatient rehab setting. Physiatrist is providing close team supervision and 24 hour management of active medical problems listed below. Physiatrist and rehab team continue to assess barriers to discharge/monitor patient progress toward functional and medical goals. FIM: FIM - Bathing Bathing Steps Patient Completed: Right Arm;Chest;Left Arm;Front perineal area;Abdomen;Buttocks;Right upper leg;Left upper leg Bathing: 4: Min-Patient completes 8-9 14f 10 parts or 75+ percent  FIM - Upper Body Dressing/Undressing Upper body dressing/undressing steps patient completed: Thread/unthread right sleeve of pullover shirt/dresss;Thread/unthread left sleeve of pullover shirt/dress;Put head through opening of pull over shirt/dress;Pull shirt over trunk Upper body dressing/undressing: 5: Set-up assist to: Obtain clothing/put away (declined bra) FIM - Lower Body Dressing/Undressing Lower body dressing/undressing steps patient completed: Thread/unthread right underwear leg;Thread/unthread right pants leg;Thread/unthread left pants leg;Pull pants up/down;Pull underwear up/down;Don/Doff left  sock;Don/Doff right shoe;Don/Doff left shoe Lower body dressing/undressing: 3: Mod-Patient completed 50-74% of tasks  FIM - Toileting Toileting steps completed by patient: Adjust clothing prior to toileting;Adjust clothing after toileting Toileting Assistive Devices: Grab bar or rail for support Toileting: 3: Mod-Patient completed 2 of 3 steps  FIM - Diplomatic Services operational officer Devices: Psychiatrist Transfers: 4-To toilet/BSC: Min A (steadying Pt. > 75%);4-From toilet/BSC: Min A (steadying Pt. > 75%)  FIM - Banker Devices: Walker;Arm rests Bed/Chair Transfer: 5: Bed > Chair or W/C: Supervision (verbal cues/safety issues)  FIM - Locomotion: Wheelchair Locomotion: Wheelchair: 1: Travels less than 50 ft with minimal assistance (Pt.>75%) FIM - Locomotion: Ambulation Locomotion: Ambulation Assistive Devices: Designer, industrial/product Ambulation/Gait Assistance: 5: Supervision Locomotion: Ambulation: 5: Travels 150 ft or more with supervision/safety issues  Comprehension Comprehension Mode: Auditory Comprehension: 6-Follows complex conversation/direction: With extra time/assistive device  Expression Expression Mode: Verbal Expression: 6-Expresses complex ideas: With extra time/assistive device  Social Interaction Social Interaction: 6-Interacts appropriately with others with medication or extra time (anti-anxiety,  antidepressant).  Problem Solving Problem Solving: 5-Solves complex 90% of the time/cues < 10% of the time  Memory Memory: 5-Recognizes or recalls 90% of the time/requires cueing < 10% of the time  Medical Problem List and Plan:  1. Thrombotic right parietal infarct/seizure disorder  2. DVT Prophylaxis/Anticoagulation: Subcutaneous heparin. Monitor platelet counts and any signs of bleeding  3. Mood/dementia. Aricept 10 mg daily. Will discuss baseline cognition with family  4. Neuropsych: This patient is capable  of making decisions on his/her own behalf.  5. Seizure disorder. EEG negative. Keppra 500 mg twice a day. Monitor for any seizure activity  6. Diabetes mellitus with peripheral neuropathy. Lantus insulin 10 units daily. Hemoglobin A1c pending. Check blood sugars a.c. and at bedtime. Patient on diet control prior to admission  7. Hypertension. Lotensin 5 mg daily, labetalol 100 mg twice a day. Monitor with increased activity  8. Diastolic congestive heart failure. Monitor for any signs of fluid overload. Patient on Lasix 20 mg daily prior to admission. Will resume as indicated  9. GERD. Protonix. hgb stable. LFT's ok. No other obvious causes. Moved bowels this am  -hold multivitamin  -increase protonix to bid  -check kub  -prn zofran   LOS (Days) 3 A FACE TO FACE EVALUATION WAS PERFORMED  SWARTZ,ZACHARY T 06/19/2012, 8:28 AM

## 2012-06-19 NOTE — Progress Notes (Signed)
Physical Therapy Note  Patient Details  Name: Glenda Gonzalez MRN: [id number] Date of Birth: Mar 14, 1934 Today's Date: 06/19/2012  Time: 11:00 Pt missed 60 minutes of therapy due to complaint of nausea.  Patient refused despite encouragement. RN aware.   Norton Pastel 06/19/2012, 2:51 PM

## 2012-06-19 NOTE — Progress Notes (Signed)
Physical Therapy Session Note  Patient Details  Name: Glenda Gonzalez MRN: [id number] Date of Birth: Oct 28, 1933  Today's Date: 06/19/2012 Time: 1330-1400 Time Calculation (min): 30 min  Short Term Goals: Week 1:  PT Short Term Goal 1 (Week 1): pt will perform basic transfer w/c>< bed with min assist PT Short Term Goal 2 (Week 1): pt will perform gait x 50' with LRAD and mod assist PT Short Term Goal 3 (Week 1): pt will ascend and descend 5 steps with 2 rails with min assist PT Short Term Goal 4 (Week 1): pt will stand during bil UE functional task x 5 minutes with min assist for balance PT Short Term Goal 5 (Week 1): pt will propel w/c x 150' with supervision in controlled environment  Therapy Documentation Precautions:  Precautions: Fall Restrictions Weight Bearing Restrictions: No Pain: 5/10 R> L abdominal pain  Therapeutic Exercise:(30') B LE's in supine and sitting including mini-squats at bedside.  See FIM for current functional status  Therapy/Group: Individual Therapy  Rex Kras 06/19/2012, 1:37 PM

## 2012-06-19 NOTE — Progress Notes (Signed)
Physical Therapy Session Note  Patient Details  Name: Glenda Gonzalez MRN: [id number] Date of Birth: February 04, 1934  Today's Date: 06/19/2012 Time: 0930-1030 Time Calculation (min): 60 min  Short Term Goals: Week 1:  PT Short Term Goal 1 (Week 1): pt will perform basic transfer w/c>< bed with min assist PT Short Term Goal 2 (Week 1): pt will perform gait x 50' with LRAD and mod assist PT Short Term Goal 3 (Week 1): pt will ascend and descend 5 steps with 2 rails with min assist PT Short Term Goal 4 (Week 1): pt will stand during bil UE functional task x 5 minutes with min assist for balance PT Short Term Goal 5 (Week 1): pt will propel w/c x 150' with supervision in controlled environment  Therapy Documentation Precautions:  Precautions Precautions: Fall Restrictions Weight Bearing Restrictions: No Pain:  Patient c/o abdominal pain 8/10 at rest (MD placed imaging orders)  Therapeutic Activity:(30') bed mobility with Mod-A to scoot to [facility] and to EOB in sitting, supine<->sit with min-A with patient using bed rail.  Transfers sit<->stand with min-A for normal bed height Therapeutic Exercise:(15') B LE's in supine and in sitting Gait Training:(15') using single point cane 2 x 120' with close SBA  See FIM for current functional status  Therapy/Group: Individual Therapy  Rex Kras 06/19/2012, 9:48 AM

## 2012-06-20 ENCOUNTER — Inpatient Hospital Stay (HOSPITAL_COMMUNITY): Payer: Medicare Other | Admitting: Physical Therapy

## 2012-06-20 ENCOUNTER — Inpatient Hospital Stay (HOSPITAL_COMMUNITY): Payer: Medicare Other | Admitting: *Deleted

## 2012-06-20 LAB — GLUCOSE, CAPILLARY
Glucose-Capillary: 140 mg/dL — ABNORMAL HIGH (ref 70–99)
Glucose-Capillary: 164 mg/dL — ABNORMAL HIGH (ref 70–99)
Glucose-Capillary: 206 mg/dL — ABNORMAL HIGH (ref 70–99)

## 2012-06-20 MED ORDER — LOPERAMIDE HCL 2 MG PO CAPS
4.0000 mg | ORAL_CAPSULE | Freq: Once | ORAL | Status: AC
  Administered 2012-06-20: 4 mg via ORAL
  Filled 2012-06-20: qty 2

## 2012-06-20 NOTE — Progress Notes (Signed)
Physical Therapy Note  Patient Details  Name: Glenda Gonzalez MRN: [id number] Date of Birth: 01-26-1934 Today's Date: 06/20/2012  1230-1300 (55 minutes) individual Pain: no complaint of pain Focus of treatment: gait training/endurance; therapeutic exercise focused on bilateral LE strengthening/ activity tolerance Treatment: gait 120 feet X 2 SPC close SBA for safety with c/o "weak legs" ; Nustep Level 2 X 10 minutes ; up/down 6 inch step x 10 with bilateral rails min assist ( for quad strengthening).    Curt Oatis,JIM 06/20/2012, 7:47 AM

## 2012-06-20 NOTE — Progress Notes (Signed)
Occupational Therapy Note  Patient Details  Name: SIGRID SCHWEBACH MRN: [id number] Date of Birth: 1934/03/21 Today's Date: 06/20/2012  Time:  1630-1700  (30 min) Individual session Pain:  None  Engaged in Upper extremity exercises, bed mobility.   Pt. Was supervision with bed mobility, and coming from supine to sit.  Pt. Tolerated session well.  Left pt on EOB with dinner tray in front of her and call bell in place.     Humberto Seals 06/20/2012, 5:07 PM

## 2012-06-20 NOTE — Progress Notes (Signed)
Patient ID: Glenda Gonzalez, female   DOB: 11-29-1933, 79 y.o.   MRN: [id number] 79 y.o. right-handed female with history of diabetes mellitus with peripheral neuropathy, dementia, coronary artery disease. Admitted 06/08/2012 after being found down questionable seizure frothing from the mouth. MRI of the brain showed subcentimeter solitary acute infarct right parietal gray matter junction without associated hemorrhage as was advanced atrophy. 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 Echocardiogram with ejection fraction of 55% and normal systolic function. Carotid Dopplers with right 40-59% ICA stenosis. EEG showed no seizure activity. Neurology consulted patient was loaded with Keppra for seizure. Aspirin was added for CVA prophylaxis. Subcutaneous heparin for DVT prophylaxis. Patient did require intubation for airway protection and was extubated 06/10/2012 without difficulty  Subjective/Complaints: Feels much better today. Just moved bowels this am.  No pain complaints  Review of Systems  Neurological: Positive for focal weakness. Negative for seizures.  All other systems reviewed and are negative.    Objective: Vital Signs: Blood pressure 125/76, pulse 65, temperature 98.2 F (36.8 C), temperature source Oral, resp. rate 17, weight 82.2 kg (181 lb 3.5 oz), SpO2 93.00%. Dg Abd 1 View  06/19/2012  *RADIOLOGY REPORT*  Clinical Data: 79 year old female abdominal pain.  ABDOMEN - 1 VIEW  Comparison: CT abdomen pelvis 03/08/2012.  Findings: Supine AP view at 1457 hours. Nonobstructed bowel gas pattern.  Evidence of hiatal hernia.  Stable pessary. No acute osseous abnormality identified.    Calcified atherosclerosis. Small surgical clips project over the central sacrum.  IMPRESSION: Nonobstructed bowel gas pattern.   Original Report Authenticated By: Erskine Speed, M.D.    Results for orders placed during the hospital encounter of 06/16/12 (from  the past 72 hour(s))  GLUCOSE, CAPILLARY     Status: Abnormal   Collection Time   06/17/12 11:27 AM      Component Value Range Comment   Glucose-Capillary 280 (*) 70 - 99 mg/dL    Comment 1 Notify RN     GLUCOSE, CAPILLARY     Status: Abnormal   Collection Time   06/17/12  4:37 PM      Component Value Range Comment   Glucose-Capillary 217 (*) 70 - 99 mg/dL   GLUCOSE, CAPILLARY     Status: Abnormal   Collection Time   06/17/12  9:25 PM      Component Value Range Comment   Glucose-Capillary 220 (*) 70 - 99 mg/dL    Comment 1 Notify RN     GLUCOSE, CAPILLARY     Status: Abnormal   Collection Time   06/18/12  7:01 AM      Component Value Range Comment   Glucose-Capillary 155 (*) 70 - 99 mg/dL    Comment 1 Notify RN     GLUCOSE, CAPILLARY     Status: Abnormal   Collection Time   06/18/12 11:30 AM      Component Value Range Comment   Glucose-Capillary 210 (*) 70 - 99 mg/dL   GLUCOSE, CAPILLARY     Status: Abnormal   Collection Time   06/18/12  4:38 PM      Component Value Range Comment   Glucose-Capillary 205 (*) 70 - 99 mg/dL   GLUCOSE, CAPILLARY     Status: Abnormal   Collection Time   06/18/12  9:25 PM      Component Value Range Comment   Glucose-Capillary 157 (*) 70 - 99 mg/dL   GLUCOSE, CAPILLARY  Status: Abnormal   Collection Time   06/19/12  7:12 AM      Component Value Range Comment   Glucose-Capillary 204 (*) 70 - 99 mg/dL   GLUCOSE, CAPILLARY     Status: Abnormal   Collection Time   06/19/12 11:42 AM      Component Value Range Comment   Glucose-Capillary 180 (*) 70 - 99 mg/dL   GLUCOSE, CAPILLARY     Status: Abnormal   Collection Time   06/19/12  4:34 PM      Component Value Range Comment   Glucose-Capillary 171 (*) 70 - 99 mg/dL   GLUCOSE, CAPILLARY     Status: Abnormal   Collection Time   06/19/12  8:06 PM      Component Value Range Comment   Glucose-Capillary 293 (*) 70 - 99 mg/dL    Comment 1 Notify RN     GLUCOSE, CAPILLARY     Status: Abnormal    Collection Time   06/20/12  7:05 AM      Component Value Range Comment   Glucose-Capillary 140 (*) 70 - 99 mg/dL      Pt is obese  HENT: oral mucosa pink and moist  Head: Normocephalic.  Eyes:  Pupils reactive to light without nystagmus . EOMI grossly intact  Neck: Neck supple. No thyromegaly present. No jvd. No LAD  Cardiovascular: Normal rate and regular rhythm. No murmur  Pulmonary/Chest:  Decreased breath sounds with improved effort and clear to auscultation  Abdominal: Soft. Bowel sounds are normal. No distention. Abdomen non tender. Neurological: She is alert.  Patient made good eye contact with examiner.Marland Kitchen Speech clear. Mild left facial weakness. She was able to give her name as well as place but had difficulty with age. She provided biographical information. Follows one and two step commands. Slower with left hand. Intentional tremor bilaterally. Strength 3/5 UE in deltoid, biceps, triceps, and HI and 2 prox LE HF and KE to 3/4 with ADF and APF . No gross sensory deficits although both legs were hypersensitive to touch below the knees to feet.  Skin: Skin is warm and dry   Assessment/Plan: 1. Functional deficits secondary to Right parietal thrombotic infarct which require 3+ hours per day of interdisciplinary therapy in a comprehensive inpatient rehab setting. Physiatrist is providing close team supervision and 24 hour management of active medical problems listed below. Physiatrist and rehab team continue to assess barriers to discharge/monitor patient progress toward functional and medical goals. FIM: FIM - Bathing Bathing Steps Patient Completed: Right Arm;Chest;Left Arm;Front perineal area;Abdomen;Buttocks;Right upper leg;Left upper leg Bathing: 4: Min-Patient completes 8-9 43f 10 parts or 75+ percent  FIM - Upper Body Dressing/Undressing Upper body dressing/undressing steps patient completed: Thread/unthread right sleeve of pullover shirt/dresss;Thread/unthread left sleeve of  pullover shirt/dress;Put head through opening of pull over shirt/dress;Pull shirt over trunk Upper body dressing/undressing: 5: Set-up assist to: Obtain clothing/put away (declined bra) FIM - Lower Body Dressing/Undressing Lower body dressing/undressing steps patient completed: Thread/unthread right underwear leg;Thread/unthread right pants leg;Thread/unthread left pants leg;Pull pants up/down;Pull underwear up/down;Don/Doff left sock;Don/Doff right shoe;Don/Doff left shoe Lower body dressing/undressing: 3: Mod-Patient completed 50-74% of tasks  FIM - Toileting Toileting steps completed by patient: Adjust clothing prior to toileting;Adjust clothing after toileting Toileting Assistive Devices: Grab bar or rail for support Toileting: 3: Mod-Patient completed 2 of 3 steps  FIM - Diplomatic Services operational officer Devices: Psychiatrist Transfers: 4-To toilet/BSC: Min A (steadying Pt. > 75%);4-From toilet/BSC: Min A (steadying Pt. > 75%)  FIM - Banker Devices: Environmental consultant;Arm rests Bed/Chair Transfer: 5: Bed > Chair or W/C: Supervision (verbal cues/safety issues)  FIM - Locomotion: Wheelchair Locomotion: Wheelchair: 1: Travels less than 50 ft with minimal assistance (Pt.>75%) FIM - Locomotion: Ambulation Locomotion: Ambulation Assistive Devices: Designer, industrial/product Ambulation/Gait Assistance: 5: Supervision Locomotion: Ambulation: 5: Travels 150 ft or more with supervision/safety issues  Comprehension Comprehension Mode: Auditory Comprehension: 6-Follows complex conversation/direction: With extra time/assistive device  Expression Expression Mode: Verbal Expression: 6-Expresses complex ideas: With extra time/assistive device  Social Interaction Social Interaction: 5-Interacts appropriately 90% of the time - Needs monitoring or encouragement for participation or interaction.  Problem Solving Problem Solving: 5-Solves basic 90% of the  time/requires cueing < 10% of the time  Memory Memory: 5-Recognizes or recalls 90% of the time/requires cueing < 10% of the time  Medical Problem List and Plan:  1. Thrombotic right parietal infarct/seizure disorder  2. DVT Prophylaxis/Anticoagulation: Subcutaneous heparin. Monitor platelet counts and any signs of bleeding  3. Mood/dementia. Aricept 10 mg daily. Will discuss baseline cognition with family  4. Neuropsych: This patient is capable of making decisions on his/her own behalf.  5. Seizure disorder. EEG negative. Keppra 500 mg twice a day. Monitor for any seizure activity  6. Diabetes mellitus with peripheral neuropathy. Lantus insulin 10 units daily. Hemoglobin A1c pending. Check blood sugars a.c. and at bedtime. Patient on diet control prior to admission  7. Hypertension. Lotensin 5 mg daily, labetalol 100 mg twice a day. Monitor with increased activity  8. Diastolic congestive heart failure. Monitor for any signs of fluid overload. Patient on Lasix 20 mg daily prior to admission. Will resume as indicated  9. GERD. Protonix. hgb stable. LFT's ok. KUB normal. Feeling better today  -held multivitamin  -increased protonix to bid  -prn zofran   LOS (Days) 4 A FACE TO FACE EVALUATION WAS PERFORMED  SWARTZ,ZACHARY T 06/20/2012, 8:18 AM

## 2012-06-20 NOTE — Progress Notes (Signed)
+/-   sleep. Requesting med for sleep. Without complaint of N & V or abdominal pain. Incontinent of urine once this shift. Other times requesting bedpan, reports urgency, wouldn't have time to get OOB without being incontinent.  BLE edema. Bilateral hand tremors noted. Senna held per patient's request. Loose stools per report. Glenda Gonzalez

## 2012-06-21 ENCOUNTER — Inpatient Hospital Stay (HOSPITAL_COMMUNITY): Payer: Medicare Other

## 2012-06-21 ENCOUNTER — Inpatient Hospital Stay (HOSPITAL_COMMUNITY): Payer: Medicare Other | Admitting: Occupational Therapy

## 2012-06-21 ENCOUNTER — Encounter (HOSPITAL_COMMUNITY): Payer: Medicare Other | Admitting: Occupational Therapy

## 2012-06-21 DIAGNOSIS — R569 Unspecified convulsions: Secondary | ICD-10-CM

## 2012-06-21 DIAGNOSIS — I6330 Cerebral infarction due to thrombosis of unspecified cerebral artery: Secondary | ICD-10-CM

## 2012-06-21 DIAGNOSIS — I69993 Ataxia following unspecified cerebrovascular disease: Secondary | ICD-10-CM

## 2012-06-21 LAB — GLUCOSE, CAPILLARY
Glucose-Capillary: 134 mg/dL — ABNORMAL HIGH (ref 70–99)
Glucose-Capillary: 143 mg/dL — ABNORMAL HIGH (ref 70–99)
Glucose-Capillary: 136 mg/dL — ABNORMAL HIGH (ref 70–99)

## 2012-06-21 MED ORDER — BD GETTING STARTED TAKE HOME KIT: 3/10ML X 30G SYRINGES
1.0000 | Freq: Once | Status: AC
  Administered 2012-06-21: 1
  Filled 2012-06-21: qty 1

## 2012-06-21 MED ORDER — SENNA 8.6 MG PO TABS
1.0000 | ORAL_TABLET | Freq: Every day | ORAL | Status: DC
  Administered 2012-06-24 – 2012-06-25 (×2): 8.6 mg via ORAL
  Filled 2012-06-21 (×5): qty 1

## 2012-06-21 NOTE — Progress Notes (Signed)
Physical Therapy Session Note  Patient Details  Name: Glenda Gonzalez MRN: [id number] Date of Birth: 1933-11-06  Today's Date: 06/21/2012 Time: 1000-1015 Time Calculation (min): 15 min  Short Term Goals: Week 1:  PT Short Term Goal 1 (Week 1): pt will perform basic transfer w/c>< bed with min assist PT Short Term Goal 2 (Week 1): pt will perform gait x 50' with LRAD and mod assist PT Short Term Goal 3 (Week 1): pt will ascend and descend 5 steps with 2 rails with min assist PT Short Term Goal 4 (Week 1): pt will stand during bil UE functional task x 5 minutes with min assist for balance PT Short Term Goal 5 (Week 1): pt will propel w/c x 150' with supervision in controlled environment  Skilled Therapeutic Interventions/Progress Updates:  AM- Pt reported bouts of diarrhea overnight; felt weak and declined getting OOB.  She was willing to do therapeutic exs in bed.  L hip abduction, L resisted hip extension 10 x 1 each, in R sidelying( positioned with pillow due to continually rolling onto back) bil glut sets, R hip abduction, alternating ankle pumps, 10 x 1 each , in supine  Pt declined further tx due to abdominal pain; RN informed  PM- Pt declined tx due to abdominal pain; RN informed.    Therapy Documentation Precautions:  Precautions Precautions: Fall Restrictions Weight Bearing Restrictions: No General: Amount of Missed PT Time (min): 40 min and 45 Minutes Missed Time Reason: Patient ill (comment) (pt declined due to abdominal pain)      See FIM for current functional status  Therapy/Group: Individual Therapy  Hughie Melroy 06/21/2012, 2:11 PM

## 2012-06-21 NOTE — Progress Notes (Signed)
Attempted to educate family on drawing and insulin administration.  Son was at the bedside, states he will be the one taking care of the patient once she goes home.  Son states he is not feeling well, he was just discharged from the hospital himself prior to visiting the patient, he will be back tomorrow morning.  Insulin starter kit at the nursing station.

## 2012-06-21 NOTE — Progress Notes (Signed)
Occupational Therapy Session Note  and Cancellation Note  Patient Details  Name: Glenda Gonzalez MRN: [id number] Date of Birth: [birth year]  Today's Date: 06/21/2012 Time: 0905-0920 Time Calculation (min): 15 min (Missed 45 min due to pain, fatigue and frequest loose stools)  Short Term Goals: Week 1:  OT Short Term Goal 1 (Week 1): Short term goals equal LTGs overall at a supervision level.  Skilled Therapeutic Interventions:  Patient resting in bed upon arrival stating that her stomach hurt and she has had frequent loose stools resulting in fatigue.  Initially, patient agreed to try to sit EOB to bath and dress.  Once she was sitting EOB, she declined to participate any further.  Assisted patient back to bed and comfortable.  Therapy Documentation Precautions:  Precautions Precautions: Fall Restrictions Weight Bearing Restrictions: No Pain: Stomach hurts, not rated, declined to continue to participate  Therapy/Group: Individual Therapy  Lillar Bianca 06/21/2012, 9:25 AM

## 2012-06-21 NOTE — Progress Notes (Signed)
Patient with 4 loose stools, continent and incontinent. Received senna at 0800. Held scheduled dose tonight. Paged Dr. Riley Kill. One time dose of 2 imodium given at 2345. BLE edema, left > right. Glenda Gonzalez A

## 2012-06-21 NOTE — Progress Notes (Signed)
Patient c/o abdominal pain, patient denies N/V.  Per RN report patient had 4 loose stools last night and was given immodium 1x dose.  Patient haven't have any loose stools since.    Patient got up at the EOB with therapy, but not able to work OOB.  Patient is resting in bed, Harvel Ricks, PA made aware, no new order.  Will keep monitor patient.

## 2012-06-21 NOTE — Progress Notes (Signed)
Patient ID: Glenda Gonzalez, female   DOB: 04/08/1934, 79 y.o.   MRN: [id number] 79 y.o. right-handed female with history of diabetes mellitus with peripheral neuropathy, dementia, coronary artery disease. Admitted 06/08/2012 after being found down questionable seizure frothing from the mouth. MRI of the brain showed subcentimeter solitary acute infarct right parietal gray matter junction without associated hemorrhage as was advanced atrophy. 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 Echocardiogram with ejection fraction of 55% and normal systolic function. Carotid Dopplers with right 40-59% ICA stenosis. EEG showed no seizure activity. Neurology consulted patient was loaded with Keppra for seizure. Aspirin was added for CVA prophylaxis. Subcutaneous heparin for DVT prophylaxis. Patient did require intubation for airway protection and was extubated 06/10/2012 without difficulty  Subjective/Complaints: C/o diarrhea and mild abd pain RN notes reviewed had no BMs on 1/25 but starting 1/26 had several small formed stools No pain complaints  Review of Systems  Neurological: Positive for focal weakness. Negative for seizures.  All other systems reviewed and are negative.    Objective: Vital Signs: Blood pressure 105/54, pulse 77, temperature 99.1 F (37.3 C), temperature source Oral, resp. rate 18, weight 82.2 kg (181 lb 3.5 oz), SpO2 90.00%. Dg Abd 1 View  06/19/2012  *RADIOLOGY REPORT*  Clinical Data: 79 year old female abdominal pain.  ABDOMEN - 1 VIEW  Comparison: CT abdomen pelvis 03/08/2012.  Findings: Supine AP view at 1457 hours. Nonobstructed bowel gas pattern.  Evidence of hiatal hernia.  Stable pessary. No acute osseous abnormality identified.    Calcified atherosclerosis. Small surgical clips project over the central sacrum.  IMPRESSION: Nonobstructed bowel gas pattern.   Original Report Authenticated By: Erskine Speed, M.D.    Results  for orders placed during the hospital encounter of 06/16/12 (from the past 72 hour(s))  GLUCOSE, CAPILLARY     Status: Abnormal   Collection Time   06/18/12 11:30 AM      Component Value Range Comment   Glucose-Capillary 210 (*) 70 - 99 mg/dL   GLUCOSE, CAPILLARY     Status: Abnormal   Collection Time   06/18/12  4:38 PM      Component Value Range Comment   Glucose-Capillary 205 (*) 70 - 99 mg/dL   GLUCOSE, CAPILLARY     Status: Abnormal   Collection Time   06/18/12  9:25 PM      Component Value Range Comment   Glucose-Capillary 157 (*) 70 - 99 mg/dL   GLUCOSE, CAPILLARY     Status: Abnormal   Collection Time   06/19/12  7:12 AM      Component Value Range Comment   Glucose-Capillary 204 (*) 70 - 99 mg/dL   GLUCOSE, CAPILLARY     Status: Abnormal   Collection Time   06/19/12 11:42 AM      Component Value Range Comment   Glucose-Capillary 180 (*) 70 - 99 mg/dL   GLUCOSE, CAPILLARY     Status: Abnormal   Collection Time   06/19/12  4:34 PM      Component Value Range Comment   Glucose-Capillary 171 (*) 70 - 99 mg/dL   GLUCOSE, CAPILLARY     Status: Abnormal   Collection Time   06/19/12  8:06 PM      Component Value Range Comment   Glucose-Capillary 293 (*) 70 - 99 mg/dL    Comment 1 Notify RN     GLUCOSE, CAPILLARY     Status: Abnormal   Collection  Time   06/20/12  7:05 AM      Component Value Range Comment   Glucose-Capillary 140 (*) 70 - 99 mg/dL   GLUCOSE, CAPILLARY     Status: Abnormal   Collection Time   06/20/12 11:37 AM      Component Value Range Comment   Glucose-Capillary 164 (*) 70 - 99 mg/dL   GLUCOSE, CAPILLARY     Status: Abnormal   Collection Time   06/20/12  4:24 PM      Component Value Range Comment   Glucose-Capillary 226 (*) 70 - 99 mg/dL   GLUCOSE, CAPILLARY     Status: Abnormal   Collection Time   06/20/12  7:54 PM      Component Value Range Comment   Glucose-Capillary 206 (*) 70 - 99 mg/dL   GLUCOSE, CAPILLARY     Status: Abnormal   Collection Time    06/21/12  7:11 AM      Component Value Range Comment   Glucose-Capillary 134 (*) 70 - 99 mg/dL      Pt is obese  HENT: oral mucosa pink and moist  Head: Normocephalic.  Eyes:  Pupils reactive to light without nystagmus . EOMI grossly intact  Neck: Neck supple. No thyromegaly present. No jvd. No LAD  Cardiovascular: Normal rate and regular rhythm. No murmur  Pulmonary/Chest:  Decreased breath sounds  clear to auscultation  Abdominal: Soft. Bowel sounds are normal. No distention. Abdomen non tender. Neurological: She is alert.  Patient made good eye contact with examiner.Marland Kitchen Speech clear. Mild left facial weakness. She was able to give her name as well as place but had difficulty with age.. Follows one and two step commands. Slower with left hand. Intentional tremor bilaterally. Strength 4/5 UE in bilateral deltoid, biceps, triceps, and HI and 3/5 proxBilateral LE HF and KE to 3-/5 with ADF and APF . No gross sensory deficits although both legs were hypersensitive to touch below the knees to feet.  Skin: Skin is warm and dry   Assessment/Plan: 1. Functional deficits secondary to Right parietal thrombotic infarct which require 3+ hours per day of interdisciplinary therapy in a comprehensive inpatient rehab setting. Physiatrist is providing close team supervision and 24 hour management of active medical problems listed below. Physiatrist and rehab team continue to assess barriers to discharge/monitor patient progress toward functional and medical goals. FIM: FIM - Bathing Bathing Steps Patient Completed: Chest;Right Arm;Left Arm;Front perineal area;Right upper leg;Left upper leg Bathing: 4: Min-Patient completes 8-9 45f 10 parts or 75+ percent  FIM - Upper Body Dressing/Undressing Upper body dressing/undressing steps patient completed: Thread/unthread right sleeve of pullover shirt/dresss;Thread/unthread left sleeve of pullover shirt/dress;Put head through opening of pull over shirt/dress;Pull  shirt over trunk Upper body dressing/undressing: 5: Set-up assist to: Obtain clothing/put away FIM - Lower Body Dressing/Undressing Lower body dressing/undressing steps patient completed: Thread/unthread right underwear leg;Thread/unthread left underwear leg;Pull underwear up/down;Thread/unthread right pants leg;Thread/unthread left pants leg;Pull pants up/down;Don/Doff right sock;Don/Doff left sock Lower body dressing/undressing: 3: Mod-Patient completed 50-74% of tasks  FIM - Toileting Toileting steps completed by patient: Adjust clothing prior to toileting;Performs perineal hygiene Toileting Assistive Devices: Grab bar or rail for support Toileting: 6: More than reasonable amount of time  FIM - Diplomatic Services operational officer Devices: Elevated toilet seat;Grab bars;Walker Toilet Transfers: 5-To toilet/BSC: Supervision (verbal cues/safety issues);5-From toilet/BSC: Supervision (verbal cues/safety issues)  FIM - Bed/Chair Transfer Bed/Chair Transfer Assistive Devices: Manufacturing systems engineer Transfer: 5: Bed > Chair or W/C: Supervision (verbal cues/safety issues)  FIM -  Locomotion: Wheelchair Locomotion: Wheelchair: 1: Travels less than 50 ft with minimal assistance (Pt.>75%) FIM - Locomotion: Ambulation Locomotion: Ambulation Assistive Devices: Designer, industrial/product Ambulation/Gait Assistance: 5: Supervision Locomotion: Ambulation: 5: Travels 150 ft or more with supervision/safety issues  Comprehension Comprehension Mode: Auditory Comprehension: 6-Follows complex conversation/direction: With extra time/assistive device  Expression Expression Mode: Verbal Expression: 6-Expresses complex ideas: With extra time/assistive device  Social Interaction Social Interaction: 5-Interacts appropriately 90% of the time - Needs monitoring or encouragement for participation or interaction.  Problem Solving Problem Solving: 5-Solves basic 90% of the time/requires cueing < 10% of the  time  Memory Memory: 5-Recognizes or recalls 90% of the time/requires cueing < 10% of the time  Medical Problem List and Plan:  1. Thrombotic right parietal infarct/seizure disorder  2. DVT Prophylaxis/Anticoagulation: Subcutaneous heparin. Monitor platelet counts and any signs of bleeding  3. Mood/dementia. Aricept 10 mg daily. Will discuss baseline cognition with family  4. Neuropsych: This patient is capable of making decisions on his/her own behalf.  5. Seizure disorder. EEG negative. Keppra 500 mg twice a day. Monitor for any seizure activity  6. Diabetes mellitus with peripheral neuropathy. Lantus insulin 10 units daily. Hemoglobin A1c pending. Check blood sugars a.c. and at bedtime. Patient on diet control prior to admission  7. Hypertension. Lotensin 5 mg daily, labetalol 100 mg twice a day. Monitor with increased activity  8. Diastolic congestive heart failure. Monitor for any signs of fluid overload. Patient on Lasix 20 mg daily prior to admission. Will resume as indicated  9. GERD. Protonix. hgb stable. LFT's ok. KUB normal. Feeling better today  -held multivitamin  -increased protonix to bid  -prn zofran 10.  Freq formed stools after constipation , will reduce senna S to qhs  LOS (Days) 5 A FACE TO FACE EVALUATION WAS PERFORMED  Dasia Guerrier E 06/21/2012, 8:42 AM

## 2012-06-21 NOTE — Progress Notes (Signed)
No further BM's after imodium given. May need to hold or change senna to PRN. Requesting bedpan during the night to void because of urgency. Spot checked PVR=59cc's. Able to talk patient into ambulating to bathroom this morning. Glenda Gonzalez A

## 2012-06-22 ENCOUNTER — Inpatient Hospital Stay (HOSPITAL_COMMUNITY): Payer: Medicare Other

## 2012-06-22 ENCOUNTER — Inpatient Hospital Stay (HOSPITAL_COMMUNITY): Payer: Medicare Other | Admitting: *Deleted

## 2012-06-22 LAB — GLUCOSE, CAPILLARY
Glucose-Capillary: 125 mg/dL — ABNORMAL HIGH (ref 70–99)
Glucose-Capillary: 128 mg/dL — ABNORMAL HIGH (ref 70–99)

## 2012-06-22 MED ORDER — PHENOL 1.4 % MT LIQD
2.0000 | OROMUCOSAL | Status: DC | PRN
  Filled 2012-06-22: qty 177

## 2012-06-22 MED ORDER — TRAZODONE HCL 50 MG PO TABS: 50.0000 mg | ORAL_TABLET | Freq: Every evening | ORAL | Status: DC | PRN

## 2012-06-22 NOTE — Plan of Care (Signed)
Problem: RH KNOWLEDGE DEFICIT Goal: RH STG INCREASE KNOWLEDGE OF HYPERTENSION Patient and /or caregiver will verbalize strategies used for management of high blood pressure( diet, meds, f/u with appointment,monitoring, etc) Outcome: Not Progressing Patient can not state medications used for control,does state "do what they tell me too" no family present at this time for education

## 2012-06-22 NOTE — Progress Notes (Signed)
Physical Therapy Session Note  Patient Details  Name: Glenda Gonzalez MRN: [id number] Date of Birth: 10-05-1933  Today's Date: 06/22/2012 Time: 1100-1200 and 1400-1430 Time Calculation (min): 60 min and 30 min  Short Term Goals: Week 1:  PT Short Term Goal 1 (Week 1): pt will perform basic transfer w/c>< bed with min assist PT Short Term Goal 2 (Week 1): pt will perform gait x 50' with LRAD and mod assist PT Short Term Goal 3 (Week 1): pt will ascend and descend 5 steps with 2 rails with min assist PT Short Term Goal 4 (Week 1): pt will stand during bil UE functional task x 5 minutes with min assist for balance PT Short Term Goal 5 (Week 1): pt will propel w/c x 150' with supervision in controlled environment  Skilled Therapeutic Interventions/Progress Updates:    AM Session: Patient received sitting in wheelchair. Today's session focused on gait training, stair negotiation, and NMR to L LE. Patient instructed in gait training x110' x2 with [facility] and supervision, see details below. Patient negotiated 5 stairs with B handrails and supervision, see details below.   Patient performed 2 sets x10 reps of step ups with L LE and B handrails with emphasis on eccentric control of L quads when stepping down and backwards off of step. Attempted to perform tandem sit<>stands with L LE positioned slightly posterior to R LE to facilitate increased weight bearing through L LE, however, patient unable to perform without significant assistance to lift. Instead, patient instructed in 3 sets x20 reps of lateral weight shifts performed in squatting position (B hip and knee flexion maintained) to facilitate weight shifting and increased weight bearing through L LE.  Patient performed 2 sets x10 reps of sit<>stands without use of UE to facilitate anterior weight shifting prior to standing and eccentric control of B quads when transferring stand>sit. Patient requires intermittent min assist for rapid, uncontrolled  descent.  Patient left seated in recliner with tray table in front of her, ready for lunch, with all needs within reach.  PM session: Patient instructed in gait training x110' x2 with [facility] and supervision. Patient exercised on NuStep Lvl4 x12' to improve strength and cardiovascular endurance.  Patient returned to room and left seated in recliner with all needs within reach.  Therapy Documentation Precautions:  Precautions Precautions: Fall Restrictions Weight Bearing Restrictions: No Pain: Pain Assessment Pain Assessment: No/denies pain Pain Score: 0-No pain Locomotion : Ambulation Ambulation: Yes Ambulation/Gait Assistance: 5: Supervision Ambulation Distance (Feet): 110 Feet Assistive device: Straight cane Ambulation/Gait Assistance Details: Verbal cues for sequencing;Verbal cues for technique;Verbal cues for precautions/safety;Verbal cues for gait pattern Ambulation/Gait Assistance Details: Verbal cues for upright posture L knee flexion and L heel strike Gait Gait: Yes Gait Pattern: Impaired Gait Pattern: Step-through pattern;Wide base of support;Decreased stride length;Trunk flexed;Decreased hip/knee flexion - left Stairs / Additional Locomotion Stairs: Yes Stairs Assistance: 5: Supervision Stairs Assistance Details: Verbal cues for sequencing;Verbal cues for technique;Verbal cues for precautions/safety;Verbal cues for gait pattern Stairs Assistance Details (indicate cue type and reason): Patient requires verbal cues for proper sequencing and technique. Stair Management Technique: Step to pattern;Forwards;Two rails Number of Stairs: 5  Height of Stairs: 6  Wheelchair Mobility Wheelchair Mobility: Yes Wheelchair Assistance: 5: Investment banker, operational: Both upper extremities Wheelchair Parts Management: Needs assistance Distance: 60   See FIM for current functional status  Therapy/Group: Individual Therapy  Chipper Herb. Yusra Ravert, PT,  DPT  06/22/2012, 2:17 PM

## 2012-06-22 NOTE — Progress Notes (Signed)
Patient ID: Glenda Gonzalez, female   DOB: 17-Jun-1933, 79 y.o.   MRN: [id number] 79 y.o. right-handed female with history of diabetes mellitus with peripheral neuropathy, dementia, coronary artery disease. Admitted 06/08/2012 after being found down questionable seizure frothing from the mouth. MRI of the brain showed subcentimeter solitary acute infarct right parietal gray matter junction without associated hemorrhage as was advanced atrophy. 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 Echocardiogram with ejection fraction of 55% and normal systolic function. Carotid Dopplers with right 40-59% ICA stenosis. EEG showed no seizure activity. Neurology consulted patient was loaded with Keppra for seizure. Aspirin was added for CVA prophylaxis. Subcutaneous heparin for DVT prophylaxis. Patient did require intubation for airway protection and was extubated 06/10/2012 without difficulty  Subjective/Complaints: C/o diarrhea and mild abd pain RN notes reviewed had no BMs on 1/25 but starting 1/26 had several small formed stools No pain complaints  Review of Systems  Neurological: Positive for focal weakness. Negative for seizures.  All other systems reviewed and are negative.    Objective: Vital Signs: Blood pressure 130/70, pulse 68, temperature 98.6 F (37 C), temperature source Oral, resp. rate 18, weight 82.2 kg (181 lb 3.5 oz), SpO2 97.00%. No results found. Results for orders placed during the hospital encounter of 06/16/12 (from the past 72 hour(s))  GLUCOSE, CAPILLARY     Status: Abnormal   Collection Time   06/19/12  4:34 PM      Component Value Range Comment   Glucose-Capillary 171 (*) 70 - 99 mg/dL   GLUCOSE, CAPILLARY     Status: Abnormal   Collection Time   06/19/12  8:06 PM      Component Value Range Comment   Glucose-Capillary 293 (*) 70 - 99 mg/dL    Comment 1 Notify RN     GLUCOSE, CAPILLARY     Status: Abnormal   Collection  Time   06/20/12  7:05 AM      Component Value Range Comment   Glucose-Capillary 140 (*) 70 - 99 mg/dL   GLUCOSE, CAPILLARY     Status: Abnormal   Collection Time   06/20/12 11:37 AM      Component Value Range Comment   Glucose-Capillary 164 (*) 70 - 99 mg/dL   GLUCOSE, CAPILLARY     Status: Abnormal   Collection Time   06/20/12  4:24 PM      Component Value Range Comment   Glucose-Capillary 226 (*) 70 - 99 mg/dL   GLUCOSE, CAPILLARY     Status: Abnormal   Collection Time   06/20/12  7:54 PM      Component Value Range Comment   Glucose-Capillary 206 (*) 70 - 99 mg/dL   GLUCOSE, CAPILLARY     Status: Abnormal   Collection Time   06/21/12  7:11 AM      Component Value Range Comment   Glucose-Capillary 134 (*) 70 - 99 mg/dL   GLUCOSE, CAPILLARY     Status: Abnormal   Collection Time   06/21/12 11:32 AM      Component Value Range Comment   Glucose-Capillary 124 (*) 70 - 99 mg/dL    Comment 1 Notify RN     GLUCOSE, CAPILLARY     Status: Abnormal   Collection Time   06/21/12  4:40 PM      Component Value Range Comment   Glucose-Capillary 143 (*) 70 - 99 mg/dL    Comment 1 Notify RN  GLUCOSE, CAPILLARY     Status: Abnormal   Collection Time   06/21/12  9:37 PM      Component Value Range Comment   Glucose-Capillary 136 (*) 70 - 99 mg/dL    Comment 1 Notify RN     GLUCOSE, CAPILLARY     Status: Abnormal   Collection Time   06/22/12  7:09 AM      Component Value Range Comment   Glucose-Capillary 125 (*) 70 - 99 mg/dL    Comment 1 Notify RN     GLUCOSE, CAPILLARY     Status: Abnormal   Collection Time   06/22/12 12:05 PM      Component Value Range Comment   Glucose-Capillary 132 (*) 70 - 99 mg/dL    Comment 1 Notify RN        Pt is obese  HENT: oral mucosa pink and moist  Head: Normocephalic.  Eyes:  Pupils reactive to light without nystagmus . EOMI grossly intact  Neck: Neck supple. No thyromegaly present. No jvd. No LAD  Cardiovascular: Normal rate and regular rhythm. No  murmur  Pulmonary/Chest:  Decreased breath sounds  clear to auscultation  Abdominal: Soft. Bowel sounds are normal. No distention. Abdomen non tender. Neurological: She is alert.  Patient made good eye contact with examiner.Marland Kitchen Speech clear. Mild left facial weakness. She was able to give her name as well as place but had difficulty with age.. Follows one and two step commands. Slower with left hand. Intentional tremor bilaterally. Strength 4/5 UE in bilateral deltoid, biceps, triceps, and HI and 3/5 proxBilateral LE HF and KE to 3-/5 with ADF and APF . No gross sensory deficits although both legs were hypersensitive to touch below the knees to feet.  Skin: Skin is warm and dry   Assessment/Plan: 1. Functional deficits secondary to Right parietal thrombotic infarct which require 3+ hours per day of interdisciplinary therapy in a comprehensive inpatient rehab setting. Physiatrist is providing close team supervision and 24 hour management of active medical problems listed below. Physiatrist and rehab team continue to assess barriers to discharge/monitor patient progress toward functional and medical goals. FIM: FIM - Bathing Bathing Steps Patient Completed: Chest;Right Arm;Left Arm;Abdomen;Front perineal area;Buttocks;Right upper leg;Left upper leg Bathing: 4: Min-Patient completes 8-9 69f 10 parts or 75+ percent (with increased time)  FIM - Upper Body Dressing/Undressing Upper body dressing/undressing steps patient completed: Thread/unthread right sleeve of pullover shirt/dresss;Thread/unthread left sleeve of pullover shirt/dress;Put head through opening of pull over shirt/dress;Pull shirt over trunk Upper body dressing/undressing: 6: More than reasonable amount of time (pt retrieved clothing out of closet with RW) FIM - Lower Body Dressing/Undressing Lower body dressing/undressing steps patient completed: Thread/unthread right underwear leg;Thread/unthread left underwear leg;Pull underwear  up/down;Thread/unthread right pants leg;Thread/unthread left pants leg;Pull pants up/down Lower body dressing/undressing: 3: Mod-Patient completed 50-74% of tasks  FIM - Toileting Toileting steps completed by patient: Adjust clothing prior to toileting;Performs perineal hygiene;Adjust clothing after toileting Toileting Assistive Devices: Grab bar or rail for support Toileting: 5: Supervision: Safety issues/verbal cues  FIM - Diplomatic Services operational officer Devices: Elevated toilet seat;Grab bars;Walker Toilet Transfers: 5-To toilet/BSC: Supervision (verbal cues/safety issues);5-From toilet/BSC: Supervision (verbal cues/safety issues)  FIM - Press photographer Assistive Devices: Arm rests;Cane Bed/Chair Transfer: 5: Chair or W/C > Bed: Supervision (verbal cues/safety issues);5: Bed > Chair or W/C: Supervision (verbal cues/safety issues)  FIM - Locomotion: Wheelchair Distance: 60 Locomotion: Wheelchair: 1: Travels less than 50 ft with supervision, cueing or coaxing FIM -  Locomotion: Ambulation Locomotion: Ambulation Assistive Devices: Emergency planning/management officer Ambulation/Gait Assistance: 5: Supervision Locomotion: Ambulation: 2: Travels 50 - 149 ft with supervision/safety issues  Comprehension Comprehension Mode: Auditory Comprehension: 6-Follows complex conversation/direction: With extra time/assistive device  Expression Expression Mode: Verbal Expression: 6-Expresses complex ideas: With extra time/assistive device  Social Interaction Social Interaction: 5-Interacts appropriately 90% of the time - Needs monitoring or encouragement for participation or interaction.  Problem Solving Problem Solving: 5-Solves basic 90% of the time/requires cueing < 10% of the time  Memory Memory: 5-Recognizes or recalls 90% of the time/requires cueing < 10% of the time  Medical Problem List and Plan:  1. Thrombotic right parietal infarct/seizure disorder  2. DVT  Prophylaxis/Anticoagulation: Subcutaneous heparin. Monitor platelet counts and any signs of bleeding  3. Mood/dementia. Aricept 10 mg daily. Will discuss baseline cognition with family  4. Neuropsych: This patient is capable of making decisions on his/her own behalf.  5. Seizure disorder. EEG negative. Keppra 500 mg twice a day. Monitor for any seizure activity  6. Diabetes mellitus with peripheral neuropathy. Lantus insulin 10 units daily. Hemoglobin A1c pending. Check blood sugars a.c. and at bedtime. Patient on diet control prior to admission  7. Hypertension. Lotensin 5 mg daily, labetalol 100 mg twice a day. Monitor with increased activity  8. Diastolic congestive heart failure. Monitor for any signs of fluid overload. Patient on Lasix 20 mg daily prior to admission. Will resume as indicated  9. GERD. Protonix. hgb stable. LFT's ok. KUB normal. Feeling better today  -held multivitamin  -increased protonix to bid  -prn zofran 10.  Freq formed stools after constipation , will reduce senna S to qhs,Stool for C. Difficile ordered although not clinically very suspicious  LOS (Days) 6 A FACE TO FACE EVALUATION WAS PERFORMED  KIRSTEINS,ANDREW E 06/22/2012, 4:05 PM

## 2012-06-22 NOTE — Progress Notes (Signed)
Occupational Therapy Note  Patient Details  Name: Glenda Gonzalez MRN: [id number] Date of Birth: 26-Apr-1934 Today's Date: 06/22/2012  Time #1: 10:00am-11:00am ( .) Time #2: 1:05pm-1:55pm ( .)   Visit #1: Pt seen for completion of morning routine including toileting, bathing at sink and dressing. Pt stating upon arrival, "I'm feeling much better today than yesterday." Pt used RW to gather her clothing before sitting at sink to bathe. Pt completed bath with max A only for back and bil feet. Pt stood to wash perineal/buttock area with CGA for safety. Pt talking throughout session and had to cue intermittently to stay on task. Pt used bathroom in middle of bathing routine, completed LB dressing at commode. Pt wants to be as independent as possible and reports she has a reacher and sock aide at home to assist her when needed, although pt opposed to using AE during routine this morning. Max A to donn compression stockings. Pt completed grooming activities from EOB before sitting in wheelchair with call bell in place.   Visit #2: Pt seen for rehearsal of AE for LB dressing. Pt clearly not interested in using a sock aide, and prefers utilizing LH shoehorn and reacher. Rehearsed doffing/donning socks and shoes. Pt able to doff socks with reacher or LH shoehorn with increased time. Pt able to cross LLE over RLE to donn sock and shoe. Gave pt elastic shoelaces as she was having difficulty donning and tying shoes. After laces on shoes, pt able to donn and doff shoes independently. Husband present for session. Pt states she has lots of family support at home and they help her when needed and she attempts to be as independent as possible.   No pain reported during either session today, just hypersensitivity of bil LE's.  Lurline Caver Hessie Diener 06/22/2012, 12:33 PM

## 2012-06-22 NOTE — Plan of Care (Signed)
Problem: RH KNOWLEDGE DEFICIT Goal: RH STG INCREASE KNOWLEDGE OF DIABETES Patient and /or caregiver will verbalize strategies used for management of diabetes( diet, meds, f/u with appointment,monitoring, etc)  Outcome: Not Progressing Patient can not state what she does for management at home and no family present for clarification or education at this time

## 2012-06-23 ENCOUNTER — Inpatient Hospital Stay (HOSPITAL_COMMUNITY): Payer: Medicare Other

## 2012-06-23 ENCOUNTER — Inpatient Hospital Stay (HOSPITAL_COMMUNITY): Payer: Medicare Other | Admitting: Occupational Therapy

## 2012-06-23 LAB — CLOSTRIDIUM DIFFICILE BY PCR: Toxigenic C. Difficile by PCR: NEGATIVE

## 2012-06-23 LAB — GLUCOSE, CAPILLARY: Glucose-Capillary: 153 mg/dL — ABNORMAL HIGH (ref 70–99)

## 2012-06-23 MED ORDER — GLIMEPIRIDE 2 MG PO TABS
2.0000 mg | ORAL_TABLET | Freq: Every day | ORAL | Status: DC
  Filled 2012-06-23: qty 1

## 2012-06-23 MED ORDER — GLIMEPIRIDE 2 MG PO TABS
2.0000 mg | ORAL_TABLET | Freq: Every day | ORAL | Status: DC
  Administered 2012-06-23 – 2012-06-26 (×5): 2 mg via ORAL
  Filled 2012-06-23 (×6): qty 1

## 2012-06-23 NOTE — Plan of Care (Signed)
Problem: RH Tub/Shower Transfers Goal: LTG Patient will perform tub/shower transfers w/assist (OT) LTG: Patient will perform tub/shower transfers with assist, with/without cues using equipment (OT)  Patient and family report that she will continue to sponge bathe upon discharge therefore, tub/shower goal has been discharged.

## 2012-06-23 NOTE — Progress Notes (Signed)
Patient ID: Glenda Gonzalez, female   DOB: 1933-09-12, 79 y.o.   MRN: [id number] 79 y.o. right-handed female with history of diabetes mellitus with peripheral neuropathy, dementia, coronary artery disease. Admitted 06/08/2012 after being found down questionable seizure frothing from the mouth. MRI of the brain showed subcentimeter solitary acute infarct right parietal gray matter junction without associated hemorrhage as was advanced atrophy. 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 Echocardiogram with ejection fraction of 55% and normal systolic function. Carotid Dopplers with right 40-59% ICA stenosis. EEG showed no seizure activity. Neurology consulted patient was loaded with Keppra for seizure. Aspirin was added for CVA prophylaxis. Subcutaneous heparin for DVT prophylaxis. Patient did require intubation for airway protection and was extubated 06/10/2012 without difficulty  Subjective/Complaints: Slept well but didn't receive desyrel RN notes reviewed had one BM this am  No pain complaints  Review of Systems  Neurological: Positive for focal weakness. Negative for seizures.  All other systems reviewed and are negative.    Objective: Vital Signs: Blood pressure 159/84, pulse 64, temperature 98.4 F (36.9 C), temperature source Oral, resp. rate 18, weight 82.2 kg (181 lb 3.5 oz), SpO2 98.00%. No results found. Results for orders placed during the hospital encounter of 06/16/12 (from the past 72 hour(s))  GLUCOSE, CAPILLARY     Status: Abnormal   Collection Time   06/20/12 11:37 AM      Component Value Range Comment   Glucose-Capillary 164 (*) 70 - 99 mg/dL   GLUCOSE, CAPILLARY     Status: Abnormal   Collection Time   06/20/12  4:24 PM      Component Value Range Comment   Glucose-Capillary 226 (*) 70 - 99 mg/dL   GLUCOSE, CAPILLARY     Status: Abnormal   Collection Time   06/20/12  7:54 PM      Component Value Range Comment   Glucose-Capillary 206 (*) 70 - 99 mg/dL   GLUCOSE, CAPILLARY     Status: Abnormal   Collection Time   06/21/12  7:11 AM      Component Value Range Comment   Glucose-Capillary 134 (*) 70 - 99 mg/dL   GLUCOSE, CAPILLARY     Status: Abnormal   Collection Time   06/21/12 11:32 AM      Component Value Range Comment   Glucose-Capillary 124 (*) 70 - 99 mg/dL    Comment 1 Notify RN     GLUCOSE, CAPILLARY     Status: Abnormal   Collection Time   06/21/12  4:40 PM      Component Value Range Comment   Glucose-Capillary 143 (*) 70 - 99 mg/dL    Comment 1 Notify RN     GLUCOSE, CAPILLARY     Status: Abnormal   Collection Time   06/21/12  9:37 PM      Component Value Range Comment   Glucose-Capillary 136 (*) 70 - 99 mg/dL    Comment 1 Notify RN     GLUCOSE, CAPILLARY     Status: Abnormal   Collection Time   06/22/12  7:09 AM      Component Value Range Comment   Glucose-Capillary 125 (*) 70 - 99 mg/dL    Comment 1 Notify RN     GLUCOSE, CAPILLARY     Status: Abnormal   Collection Time   06/22/12 12:05 PM      Component Value Range Comment   Glucose-Capillary 132 (*) 70 - 99 mg/dL  Comment 1 Notify RN     GLUCOSE, CAPILLARY     Status: Abnormal   Collection Time   06/22/12  4:29 PM      Component Value Range Comment   Glucose-Capillary 128 (*) 70 - 99 mg/dL    Comment 1 Notify RN     GLUCOSE, CAPILLARY     Status: Abnormal   Collection Time   06/22/12  9:04 PM      Component Value Range Comment   Glucose-Capillary 160 (*) 70 - 99 mg/dL    Comment 1 Notify RN     GLUCOSE, CAPILLARY     Status: Abnormal   Collection Time   06/23/12  7:05 AM      Component Value Range Comment   Glucose-Capillary 153 (*) 70 - 99 mg/dL    Comment 1 Notify RN        Pt is obese  HENT: oral mucosa pink and moist  Head: Normocephalic.  Eyes:  Pupils reactive to light without nystagmus . EOMI grossly intact  Neck: Neck supple. No thyromegaly present. No jvd. No LAD  Cardiovascular: Normal rate and  regular rhythm. No murmur  Pulmonary/Chest:  Decreased breath sounds  clear to auscultation  Abdominal: Soft. Bowel sounds are normal. No distention. Abdomen non tender. Neurological: She is alert.  Patient made good eye contact with examiner.Marland Kitchen Speech clear. Mild left facial weakness. She was able to give her name as well as place but had difficulty with age.. Follows one and two step commands. Slower with left hand. Intentional tremor bilaterally. Strength 4/5 UE in bilateral deltoid, biceps, triceps, and HI and 3/5 proxBilateral LE HF and KE to 3-/5 with ADF and APF . No gross sensory deficits although both legs were hypersensitive to touch below the knees to feet.  Skin: Skin is warm and dry   Assessment/Plan: 1. Functional deficits secondary to Right parietal thrombotic infarct which require 3+ hours per day of interdisciplinary therapy in a comprehensive inpatient rehab setting. Physiatrist is providing close team supervision and 24 hour management of active medical problems listed below. Physiatrist and rehab team continue to assess barriers to discharge/monitor patient progress toward functional and medical goals. FIM: FIM - Bathing Bathing Steps Patient Completed: Chest;Right Arm;Left Arm;Abdomen;Front perineal area;Buttocks;Right upper leg;Left upper leg Bathing: 4: Min-Patient completes 8-9 52f 10 parts or 75+ percent (with increased time)  FIM - Upper Body Dressing/Undressing Upper body dressing/undressing steps patient completed: Thread/unthread right sleeve of pullover shirt/dresss;Thread/unthread left sleeve of pullover shirt/dress;Put head through opening of pull over shirt/dress;Pull shirt over trunk Upper body dressing/undressing: 6: More than reasonable amount of time (pt retrieved clothing out of closet with RW) FIM - Lower Body Dressing/Undressing Lower body dressing/undressing steps patient completed: Thread/unthread right underwear leg;Thread/unthread left underwear  leg;Pull underwear up/down;Thread/unthread right pants leg;Thread/unthread left pants leg;Pull pants up/down Lower body dressing/undressing: 3: Mod-Patient completed 50-74% of tasks  FIM - Toileting Toileting steps completed by patient: Adjust clothing prior to toileting;Adjust clothing after toileting Toileting Assistive Devices: Grab bar or rail for support Toileting: 3: Mod-Patient completed 2 of 3 steps  FIM - Diplomatic Services operational officer Devices: Elevated toilet seat;Grab bars;Walker Toilet Transfers: 5-To toilet/BSC: Supervision (verbal cues/safety issues);5-From toilet/BSC: Supervision (verbal cues/safety issues)  FIM - Press photographer Assistive Devices: Arm rests;Cane Bed/Chair Transfer: 5: Chair or W/C > Bed: Supervision (verbal cues/safety issues);5: Bed > Chair or W/C: Supervision (verbal cues/safety issues)  FIM - Locomotion: Wheelchair Distance: 60 Locomotion: Wheelchair: 1: Travels less than  50 ft with supervision, cueing or coaxing FIM - Locomotion: Ambulation Locomotion: Ambulation Assistive Devices: Emergency planning/management officer Ambulation/Gait Assistance: 5: Supervision Locomotion: Ambulation: 2: Travels 50 - 149 ft with supervision/safety issues  Comprehension Comprehension Mode: Auditory Comprehension: 6-Follows complex conversation/direction: With extra time/assistive device  Expression Expression Mode: Verbal Expression: 5-Expresses basic 90% of the time/requires cueing < 10% of the time.  Social Interaction Social Interaction: 5-Interacts appropriately 90% of the time - Needs monitoring or encouragement for participation or interaction.  Problem Solving Problem Solving: 5-Solves basic 90% of the time/requires cueing < 10% of the time  Memory Memory: 5-Recognizes or recalls 90% of the time/requires cueing < 10% of the time  Medical Problem List and Plan:  1. Thrombotic right parietal infarct/seizure disorder  2. DVT  Prophylaxis/Anticoagulation: Subcutaneous heparin. Monitor platelet counts and any signs of bleeding  3. Mood/dementia. Aricept 10 mg daily. Will discuss baseline cognition with family  4. Neuropsych: This patient is capable of making decisions on his/her own behalf.  5. Seizure disorder. EEG negative. Keppra 500 mg twice a day. Monitor for any seizure activity  6. Diabetes mellitus with peripheral neuropathy. Lantus insulin 10 units daily. Hemoglobin A1c pending. Check blood sugars a.c. and at bedtime. Patient on diet control prior to admission  7. Hypertension. Lotensin 5 mg daily, labetalol 100 mg twice a day. Monitor with increased activity  8. Diastolic congestive heart failure. Monitor for any signs of fluid overload. Patient on Lasix 20 mg daily prior to admission. Will resume as indicated  9. GERD. Protonix. hgb stable. LFT's ok. KUB normal. Feeling better today  -held multivitamin  -increased protonix to bid  -prn zofran 10.  Freq formed stools after constipation , will reduce senna S to qhs,Stool for C. Difficile ordered although not clinically very suspicious  LOS (Days) 7 A FACE TO FACE EVALUATION WAS PERFORMED  KIRSTEINS,ANDREW E 06/23/2012, 9:42 AM

## 2012-06-23 NOTE — Progress Notes (Signed)
Social Work Patient ID: Glenda Gonzalez, female   DOB: 10-17-1933, 79 y.o.   MRN: [id number] Met with pt, daughter, son and husband to inform of team conference goals-supervision level and discharge 2/1. Question asked why can not go home sooner.  Informed MD taking pt off insulin and starting oral agent to see if this regulates Her blood sugars and no need for insulin at home.  Pt really does not want to use insulin at home.  Discussed this transition will take A few days to see if oral agent is working.  All in agreement with and hopeful regarding this issue.  Family to stay for PT and complete family education today. Pt has all DME and will address follow up if needed upon discharge.  All in agreement with plan.

## 2012-06-23 NOTE — Progress Notes (Signed)
Occupational Therapy Session Note  Patient Details  Name: Glenda Gonzalez MRN: [id number] Date of Birth: [birth year]  Today's Date: 06/23/2012 Time: 0800-0900 and 1105-1200 Time Calculation (min): 60 min and 55 min  Short Term Goals: Week 1:  OT Short Term Goal 1 (Week 1): Short term goals equal LTGs overall at a supervision level.  Skilled Therapeutic Interventions/Progress Updates:  1)  Patient resting in bed and finishing her breakfast.   Engaged in self care retraining to include sponge bath (declined shower), dressing, grooming and toileting.  Focused session on activity tolerance, dynamic sitting and standing balance, and adaptive techniques.  Patient requires vcs to stay on task and for memory related to task completion.  Patient stood for increased time today for toileting tasks and is delighted with her elastic shoelaces.  Patient assisted to recliner with call bell and phone in reach and feet up to assist with BLE edema.  2)  Patient sleeping in recliner upon arrival.  Engaged in ambulate in her hospital room to sink for grooming tasks, closet to sort out her clean and her dirty clothes, and family came in near end of session therefore, family education completed.  Family state that from a self care standpoint, they were assisting patient and she had 24/7 supervision PTA and they did not need any hands on training.   Patient's husband, daughter-Leah, and son-Paul report that they have no further questions for OT and are ready to take patient home whenever we release her.  Therapy Documentation Precautions:  Precautions Precautions: Fall Restrictions Weight Bearing Restrictions: No Pain: Denies pain ADL: See FIM for current functional status  Therapy/Group: Individual Therapy  [name] 06/23/2012, 12:24 PM

## 2012-06-23 NOTE — Progress Notes (Signed)
Physical Therapy Weekly Progress Note  Patient Details  Name: Glenda Gonzalez MRN: [id number] Date of Birth: 02/02/1934  Today's Date: 06/23/2012 Time: 1300-1345 and 8295-6213 Time Calculation (min): 45 min and 45 min  Patient has met 5 of 5 short term goals.    Patient continues to demonstrate the following deficits: motor control, balance, activity tolerance, safety/cognition and therefore will continue to benefit from skilled PT intervention to enhance overall performance with activity tolerance, balance, ability to compensate for deficits, functional use of  left lower extremity and coordination.  Patient progressing toward long term goals..  Continue plan of care.    PT Short Term Goals Week 1:  PT Short Term Goal 1 (Week 1): pt will perform basic transfer w/c>< bed with min assist PT Short Term Goal 1 - Progress (Week 1): Met PT Short Term Goal 2 (Week 1): pt will perform gait x 50' with LRAD and mod assist PT Short Term Goal 2 - Progress (Week 1): Met PT Short Term Goal 3 (Week 1): pt will ascend and descend 5 steps with 2 rails with min assist PT Short Term Goal 3 - Progress (Week 1): Met PT Short Term Goal 4 (Week 1): pt will stand during bil UE functional task x 5 minutes with min assist for balance PT Short Term Goal 4 - Progress (Week 1): Met PT Short Term Goal 5 (Week 1): pt will propel w/c x 150' with supervision in controlled environment PT Short Term Goal 5 - Progress (Week 1): Met Week 2: LTGs    Skilled Therapeutic Interventions/Progress Updates:   AM-   Recliner > stand with Rw, close supervision.  Gait x 150' with RW, supervision.    Simulated car transfer to [employer], with Rw with supervision, VCs for hand placement.    At counter, pt used bil UE support and performed calf raises, mini squats with cues for technique.    Gait returning to room, supervision with RW.  Pt reported she owns a 4WW and will ask husband    PM- Husband here for family ed.  He  brought in pt's 4WW (swivel front wheels)   Gait with 4WW x 70', supervision to ADL apt. , VCs for safety and use of brakes for each sit>< stand.  L brake on 4 [employer] not working well.  Husband reported that pt owns a RW with 2 front wheels; he agreed it would be safer for her for now.   Bed mobility on standard bed independent.  Gait x 155' with RW, with supervision, cues for upright posture, forward gaze.  Gait up/down 5 steps with bil rails, cues for sequencing.    Son and daughter arrived for education.  They observed and return demonstrated transfers, gait on level tile and up/down 5 steps, safe use of Rw, assisting with [place] exs. Discussed car transfer, which pt performed in the same way this AM that she has been doing at home PTA.  neuromuscular re-education via demo, manual cues for: -[place] ex program with bil UE support: alternating hip abduction, mini squats, calf raises, hamstring curls; 1# wt on bil ankles for hip abduction, 1# on R ankle, 0# on  LLE for hamstring curl; in sitting, alternating knee extension with ankle pumps at end range of knee extension for heel cord stretch.    Gait returning to room with RW x 180' with supervision; pt c/o LE fatigue requiring 1 standing rest break. Provided Otago hand out to family.  Therapy Documentation Precautions:  Precautions Precautions: Fall Restrictions Weight Bearing Restrictions: No   Pain: Pain Assessment Pain Assessment: No/denies pain   Locomotion : Ambulation Ambulation/Gait Assistance: 5: Supervision     See FIM for current functional status  Therapy/Group: Individual Therapy  Leva Baine 06/23/2012, 2:59 PM

## 2012-06-23 NOTE — Patient Care Conference (Signed)
Inpatient RehabilitationTeam Conference and Plan of Care Update Date: 06/23/2012   Time: 11;15 AM    Patient Name: Glenda Gonzalez      Medical Record Number: [id number]  Date of Birth: 03-29-1934 Sex: Female         Room/Bed: 4149/4149-01 Payor Info: Payor: Advertising copywriter MEDICARE  Plan: AARP MEDICARE COMPLETE  Product Type: *No Product type*     Admitting Diagnosis: RT PARIETAL CVA AND SEIZURE  Admit Date/Time:  06/16/2012  3:54 PM Admission Comments: No comment available   Primary Diagnosis:  CVA (cerebral infarction) Principal Problem: CVA (cerebral infarction)  Patient Active Problem List   Diagnosis Date Noted  . CVA (cerebral infarction) 06/16/2012  . Acute encephalopathy 06/09/2012  . Acute respiratory failure 06/09/2012  . Status epilepticus 06/09/2012  . Hyperglycemia 06/09/2012  . C. difficile colitis 07/23/2011  . UTI (lower urinary tract infection) 07/23/2011  . Hypoglycemia 07/23/2011  . Generalized weakness 07/20/2011  . Fall 07/20/2011  . Hypokalemia 07/20/2011  . Hyponatremia 07/20/2011  . Acute diarrhea 07/20/2011  . CAD 04/25/2009  . CLAUDICATION 04/25/2009  . DM 04/24/2009  . GOUT 04/24/2009  . DEMENTIA, MILD 04/24/2009  . TREMOR, ESSENTIAL 04/24/2009  . HYPERTENSION 04/24/2009  . CONGESTIVE HEART FAILURE UNSPECIFIED 04/24/2009  . GERD 04/24/2009  . OSTEOARTHRITIS 04/24/2009    Expected Discharge Date: Expected Discharge Date: 06/26/12  Team Members Present: Physician leading conference: Dr. Claudette Laws Social Worker Present: Dossie Der, LCSW Nurse Present: Gregor Hams, RN PT Present: Edman Circle, PT;Caroline Adriana Simas, PT;Other (comment) Clarisse Gouge Ripa-PT) OT Present: Leonette Monarch, OT SLP Present: Fae Pippin, SLP Other (Discipline and Name): laura Jobe-Dietician     Current Status/Progress Goal Weekly Team Focus  Medical   BS controlled with insulin but only requires 10 units, poor tolerance of insulin shots and teaching an  issue  improve diabetic control without insulin  stop lantus initiate amaryl   Bowel/Bladder   continent of bowel and bladder, incontinent on 06/20/12 x 4 per report , immodium prn, C-Diff stool ordered  continent of bowel and bladder with toileting  up to Bathroom or Bedside commode   Swallow/Nutrition/ Hydration   CMM diet, encouraging fluids  to eat atleast 50-75% of tray      ADL's   Overalll Supervision to min assist  Overall Supervision except LB bath and dressing min assist secondary to patient does not like to use her AE, Shower goal dischaged per patient and family request  balance, activity tolerance family education   Mobility   Supervision overall  supervision overall  neuro re-ed, balance, family ed, functional mobility, HEP   Communication   can communicate needs, verbal reminders to use call light not just when staff in room  to make needs known to staff, family      Safety/Cognition/ Behavioral Observations  forgetful, bed alarm in use  no falls with injury      Pain   no complaint of pain  3 or less on scale of 1-10      Skin   intact, buttocks pink,blanchable, intact  no breakdown         *See Interdisciplinary Assessment and Plan and progress notes for long and short-term goals  Barriers to Discharge: family schedule inerferes with training    Possible Resolutions to Barriers:  SW to contact family for teaching    Discharge Planning/Teaching Needs:  [place] with husband and children providing care-son.  Pt had 24hr supervision PTA, due to dementia.  Team Discussion:  Family education today-since here.  Stopping insulin and starting oral agent to see if regulates blood sugars.  Family provides supervision PTA and will be there with her.  Revisions to Treatment Plan:  None   Continued Need for Acute Rehabilitation Level of Care: The patient requires daily medical management by a physician with specialized training in physical medicine and rehabilitation for  the following conditions: Daily direction of a multidisciplinary physical rehabilitation program to ensure safe treatment while eliciting the highest outcome that is of practical value to the patient.: Yes Daily medical management of patient stability for increased activity during participation in an intensive rehabilitation regime.: Yes Daily analysis of laboratory values and/or radiology reports with any subsequent need for medication adjustment of medical intervention for : Neurological problems  Cosme Jacob, Lemar Livings 06/23/2012, 1:17 PM

## 2012-06-24 ENCOUNTER — Inpatient Hospital Stay (HOSPITAL_COMMUNITY): Payer: Medicare Other

## 2012-06-24 ENCOUNTER — Encounter (HOSPITAL_COMMUNITY): Payer: Medicare Other | Admitting: Occupational Therapy

## 2012-06-24 ENCOUNTER — Inpatient Hospital Stay (HOSPITAL_COMMUNITY): Payer: Medicare Other | Admitting: Occupational Therapy

## 2012-06-24 NOTE — Progress Notes (Signed)
Social Work Patient ID: Glenda Gonzalez, female   DOB: [birth year], 79 y.o.   MRN: [id number] Met with pt and Caroline-PT to discuss follow up therapies.  Pt scored a low on the Berg balance test and [name] wants her to have HHPT at home to continue to work on balance Issues.  Pt has no preference and is agreeable to the plan.  She is looking forward to going home Sat.  Her throat feels better also.  Work toward discharge Sat.  Has all DME.

## 2012-06-24 NOTE — Progress Notes (Signed)
Patient ID: Glenda Gonzalez, female   DOB: 1933/10/25, 79 y.o.   MRN: [id number] 79 y.o. right-handed female with history of diabetes mellitus with peripheral neuropathy, dementia, coronary artery disease. Admitted 06/08/2012 after being found down questionable seizure frothing from the mouth. MRI of the brain showed subcentimeter solitary acute infarct right parietal gray matter junction without associated hemorrhage as was advanced atrophy. 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 Echocardiogram with ejection fraction of 55% and normal systolic function. Carotid Dopplers with right 40-59% ICA stenosis. EEG showed no seizure activity. Neurology consulted patient was loaded with Keppra for seizure. Aspirin was added for CVA prophylaxis. Subcutaneous heparin for DVT prophylaxis. Patient did require intubation for airway protection and was extubated 06/10/2012 without difficulty  Subjective/Complaints: Sleeps on and off No more diarrhea  Review of Systems  Neurological: Positive for focal weakness. Negative for seizures.  All other systems reviewed and are negative.    Objective: Vital Signs: Blood pressure 166/83, pulse 58, temperature 98.5 F (36.9 C), temperature source Oral, resp. rate 18, weight 80.2 kg (176 lb 12.9 oz), SpO2 96.00%. No results found. Results for orders placed during the hospital encounter of 06/16/12 (from the past 72 hour(s))  GLUCOSE, CAPILLARY     Status: Abnormal   Collection Time   06/21/12  4:40 PM      Component Value Range Comment   Glucose-Capillary 143 (*) 70 - 99 mg/dL    Comment 1 Notify RN     GLUCOSE, CAPILLARY     Status: Abnormal   Collection Time   06/21/12  9:37 PM      Component Value Range Comment   Glucose-Capillary 136 (*) 70 - 99 mg/dL    Comment 1 Notify RN     GLUCOSE, CAPILLARY     Status: Abnormal   Collection Time   06/22/12  7:09 AM      Component Value Range Comment   Glucose-Capillary 125 (*) 70 - 99 mg/dL    Comment 1 Notify RN     GLUCOSE, CAPILLARY     Status: Abnormal   Collection Time   06/22/12 12:05 PM      Component Value Range Comment   Glucose-Capillary 132 (*) 70 - 99 mg/dL    Comment 1 Notify RN     GLUCOSE, CAPILLARY     Status: Abnormal   Collection Time   06/22/12  4:29 PM      Component Value Range Comment   Glucose-Capillary 128 (*) 70 - 99 mg/dL    Comment 1 Notify RN     GLUCOSE, CAPILLARY     Status: Abnormal   Collection Time   06/22/12  9:04 PM      Component Value Range Comment   Glucose-Capillary 160 (*) 70 - 99 mg/dL    Comment 1 Notify RN     GLUCOSE, CAPILLARY     Status: Abnormal   Collection Time   06/23/12  7:05 AM      Component Value Range Comment   Glucose-Capillary 153 (*) 70 - 99 mg/dL    Comment 1 Notify RN     CLOSTRIDIUM DIFFICILE BY PCR     Status: Normal   Collection Time   06/23/12  8:55 AM      Component Value Range Comment   C difficile by pcr NEGATIVE  NEGATIVE   GLUCOSE, CAPILLARY     Status: Abnormal   Collection Time   06/23/12 11:28  AM      Component Value Range Comment   Glucose-Capillary 146 (*) 70 - 99 mg/dL    Comment 1 Notify RN        Pt is obese  HENT: oral mucosa pink and moist  Head: Normocephalic.  Eyes:  Pupils reactive to light without nystagmus . EOMI grossly intact  Neck: Neck supple. No thyromegaly present. No jvd. No LAD  Cardiovascular: Normal rate and regular rhythm. No murmur  Pulmonary/Chest:  Decreased breath sounds  clear to auscultation  Abdominal: Soft. Bowel sounds are normal. No distention. Abdomen non tender. Neurological: She is alert.  Patient made good eye contact with examiner.Marland Kitchen Speech clear. Mild left facial weakness. She was able to give her name as well as place but had difficulty with age.. Follows one and two step commands. Slower with left hand. Intentional tremor bilaterally. Strength 4/5 UE in bilateral deltoid, biceps, triceps, and HI and 3/5  proxBilateral LE HF and KE to 3-/5 with ADF and APF . No gross sensory deficits although both legs were hypersensitive to touch below the knees to feet.  Skin: Skin is warm and dry   Assessment/Plan: 1. Functional deficits secondary to Right parietal thrombotic infarct which require 3+ hours per day of interdisciplinary therapy in a comprehensive inpatient rehab setting. Physiatrist is providing close team supervision and 24 hour management of active medical problems listed below. Physiatrist and rehab team continue to assess barriers to discharge/monitor patient progress toward functional and medical goals. FIM: FIM - Bathing Bathing Steps Patient Completed: Chest;Right Arm;Left Arm;Abdomen;Front perineal area;Buttocks;Right upper leg;Left upper leg (pt states husband washed feet at home) Bathing: 4: Min-Patient completes 8-9 85f 10 parts or 75+ percent  FIM - Upper Body Dressing/Undressing Upper body dressing/undressing steps patient completed: Thread/unthread right sleeve of pullover shirt/dresss;Thread/unthread left sleeve of pullover shirt/dress;Put head through opening of pull over shirt/dress;Pull shirt over trunk Upper body dressing/undressing: 6: More than reasonable amount of time FIM - Lower Body Dressing/Undressing Lower body dressing/undressing steps patient completed: Thread/unthread right pants leg;Thread/unthread left pants leg;Pull pants up/down;Don/Doff right shoe;Don/Doff left shoe (brief teds and elastic shoe laces) Lower body dressing/undressing: 5: Supervision: Safety issues/verbal cues (5/5 appropriate tasks)  FIM - Toileting Toileting steps completed by patient: Adjust clothing prior to toileting;Performs perineal hygiene;Adjust clothing after toileting Toileting Assistive Devices: Grab bar or rail for support Toileting: 0: Activity did not occur  FIM - Diplomatic Services operational officer Devices: Elevated toilet seat;Grab bars;Walker Event organiser:  0-Activity did not occur  FIM - Banker Devices: Therapist, occupational: 5: Bed > Chair or W/C: Supervision (verbal cues/safety issues);5: Chair or W/C > Bed: Supervision (verbal cues/safety issues)  FIM - Locomotion: Wheelchair Distance: 60 Locomotion: Wheelchair: 0: Activity did not occur FIM - Locomotion: Ambulation Locomotion: Ambulation Assistive Devices: Designer, industrial/product Ambulation/Gait Assistance: 5: Supervision Locomotion: Ambulation: 5: Travels 150 ft or more with supervision/safety issues  Comprehension Comprehension Mode: Auditory Comprehension: 7-Follows complex conversation/direction: With no assist  Expression Expression Mode: Verbal Expression: 7-Expresses complex ideas: With no assist  Social Interaction Social Interaction: 7-Interacts appropriately with others - No medications needed.  Problem Solving Problem Solving: 5-Solves complex 90% of the time/cues < 10% of the time  Memory Memory: 5-Recognizes or recalls 90% of the time/requires cueing < 10% of the time  Medical Problem List and Plan:  1. Thrombotic right parietal infarct/seizure disorder  2. DVT Prophylaxis/Anticoagulation: Subcutaneous heparin. Monitor platelet counts and any signs of bleeding  3. Mood/dementia. Aricept  10 mg daily. Will discuss baseline cognition with family  4. Neuropsych: This patient is capable of making decisions on his/her own behalf.  5. Seizure disorder. EEG negative. Keppra 500 mg twice a day. Monitor for any seizure activity  6. Diabetes mellitus with peripheral neuropathy. . Amaryl 2mg   Check blood sugars a.c. and at bedtime. Patient on diet control prior to admission  7. Hypertension. Lotensin 5 mg daily, labetalol 100 mg twice a day. Monitor with increased activity  8. Diastolic congestive heart failure. Monitor for any signs of fluid overload. Patient on Lasix 20 mg daily prior to admission. Will resume as indicated  9. GERD.  Protonix. hgb stable. LFT's ok. KUB normal. Feeling better today  -held multivitamin  -increased protonix to bid  -prn zofran 10.  Freq formed stools after constipation , will reduce senna S to qhs,Stool for C. Difficile ordered although not clinically very suspicious  LOS (Days) 8 A FACE TO FACE EVALUATION WAS PERFORMED  Keli Buehner E 06/24/2012, 2:41 PM

## 2012-06-24 NOTE — Progress Notes (Addendum)
Physical Therapy Session Note  Patient Details  Name: Glenda Gonzalez MRN: [id number] Date of Birth: [birth year]  Today's Date: 06/24/2012 Time: 1400-1425 (25 min)  Short Term Goals: Week 1:  PT Short Term Goal 1 (Week 1): pt will perform basic transfer w/c>< bed with min assist PT Short Term Goal 1 - Progress (Week 1): Met PT Short Term Goal 2 (Week 1): pt will perform gait x 50' with LRAD and mod assist PT Short Term Goal 2 - Progress (Week 1): Met PT Short Term Goal 3 (Week 1): pt will ascend and descend 5 steps with 2 rails with min assist PT Short Term Goal 3 - Progress (Week 1): Met PT Short Term Goal 4 (Week 1): pt will stand during bil UE functional task x 5 minutes with min assist for balance PT Short Term Goal 4 - Progress (Week 1): Met PT Short Term Goal 5 (Week 1): pt will propel w/c x 150' with supervision in controlled environment PT Short Term Goal 5 - Progress (Week 1): Met  Skilled Therapeutic Interventions/Progress Updates:   Pt fatigued from just finishing an hour PT session and in bed. Agreeable to therex in supine including ankle pumps, heel slides, hip abduction, SAQ, and glut sets x 10 reps x 2 sets. Supine <-> sit with S and side stepping EOB for repositioning in the bed with S.  Therapy Documentation Precautions:  Precautions Precautions: Fall Restrictions Weight Bearing Restrictions: No   Pain:  No complaints.    See FIM for current functional status  Therapy/Group: Individual Therapy  Karolee Stamps [facility] 06/24/2012, 2:05 PM

## 2012-06-24 NOTE — Progress Notes (Signed)
Occupational Therapy Note  Patient Details  Name: Glenda Gonzalez MRN: [id number] Date of Birth: [birth year] Today's Date: 06/24/2012  Time In:  0800  Time Out:  0858.  Individual session no c/o pain.  Patient stated she slept well.  Treatment focused on ADL retraining at sink level (pt refused shower - does not shower at home) with emphasis on bed mobility, sit to stand, bed to wheelchair transfers, standing balance, increasing activity tolerance, functional ambulation with RW, safety, energy conservation.  Patient looking forward to discharge on Saturday.  Patient returned to room, sitting in wheelchair with phone and call light in reach.   Norton Pastel 06/24/2012, 9:05 AM

## 2012-06-24 NOTE — Progress Notes (Signed)
Physical Therapy Session Note  Patient Details  Name: Glenda Gonzalez MRN: [id number] Date of Birth: 01-22-1934  Today's Date: 06/24/2012 Time: 1115-1200 and 1300-1400 Time Calculation (min): 45 min and 60 min  Short Term Goals: Week 2: LTGS     Skilled Therapeutic Interventions/Progress Updates:  AM- -Gait training room<> gym x 150' with supervision, cues for keeping LEs within RW.  Sharlene Motts test = 27. -balance training on compliant [employer] with min> max assist to prevent fall for LOB posteriorly during L and R wt shifting, toes up/down.  PM- -Otago exs using hand-out:  Alternating knee extension with ankle pumps at end range in sitting, 1# R 0# L for hamstring curls, 1# on L and R for hip abduction in standing, mini squats -Kinetron at 30 cm/sec in sitting, iwthout use of UEs focusing on L extension, x 25 cycles x 4 -Repetitive sit>< stand without use of UEs focusing on anterior pelvic tilt, trunk flexion, forward wt shift onto toes. -Gait x 186' returning to room, c/o fatigue with superviison.  Son transferred pt w/c> bed safely, with supervision.      Therapy Documentation Precautions:  Precautions Precautions: Fall Restrictions Weight Bearing Restrictions: No      Balance: Balance Balance Assessed: Yes Standardized Balance Assessment Standardized Balance Assessment: Berg Balance Test Berg Balance Test Sit to Stand: Able to stand without using hands and stabilize independently Standing Unsupported: Able to stand safely 2 minutes Sitting with Back Unsupported but Feet Supported on Floor or Stool: Able to sit safely and securely 2 minutes Stand to Sit: Sits safely with minimal use of hands Transfers: Able to transfer safely, definite need of hands Standing Unsupported with Eyes Closed: Able to stand 10 seconds with supervision Standing Ubsupported with Feet Together: Needs help to attain position and unable to hold for 15 seconds From Standing, Reach Forward with  Outstretched Arm: Reaches forward but needs supervision From Standing Position, Pick up Object from Floor: Able to pick up shoe, needs supervision From Standing Position, Turn to Look Behind Over each Shoulder: Needs assist to keep from losing balance and falling Turn 360 Degrees: Needs close supervision or verbal cueing Standing Unsupported, Alternately Place Feet on Step/Stool: Needs assistance to keep from falling or unable to try Standing Unsupported, One Foot in Front: Loses balance while stepping or standing Standing on One Leg: Unable to try or needs assist to prevent fall Total Score: 27    See FIM for current functional status  Therapy/Group: Individual Therapy  Glenda Gonzalez 06/24/2012, 12:19 PM

## 2012-06-25 ENCOUNTER — Inpatient Hospital Stay (HOSPITAL_COMMUNITY): Payer: Medicare Other | Admitting: Physical Therapy

## 2012-06-25 ENCOUNTER — Inpatient Hospital Stay (HOSPITAL_COMMUNITY): Payer: Medicare Other | Admitting: Occupational Therapy

## 2012-06-25 ENCOUNTER — Inpatient Hospital Stay (HOSPITAL_COMMUNITY): Payer: Medicare Other

## 2012-06-25 ENCOUNTER — Inpatient Hospital Stay (HOSPITAL_COMMUNITY): Payer: Medicare Other | Admitting: *Deleted

## 2012-06-25 DIAGNOSIS — R569 Unspecified convulsions: Secondary | ICD-10-CM

## 2012-06-25 DIAGNOSIS — I6330 Cerebral infarction due to thrombosis of unspecified cerebral artery: Secondary | ICD-10-CM

## 2012-06-25 DIAGNOSIS — I69993 Ataxia following unspecified cerebrovascular disease: Secondary | ICD-10-CM

## 2012-06-25 MED ORDER — LEVETIRACETAM 500 MG PO TABS: 500.0000 mg | ORAL_TABLET | Freq: Two times a day (BID) | ORAL | Status: DC

## 2012-06-25 MED ORDER — BIMATOPROST 0.03 % OP SOLN: 1.0000 [drp] | Freq: Two times a day (BID) | OPHTHALMIC | Status: DC

## 2012-06-25 MED ORDER — LABETALOL HCL 100 MG PO TABS: 100.0000 mg | ORAL_TABLET | Freq: Two times a day (BID) | ORAL | Status: DC

## 2012-06-25 MED ORDER — ESOMEPRAZOLE MAGNESIUM 40 MG PO CPDR: 40.0000 mg | DELAYED_RELEASE_CAPSULE | Freq: Two times a day (BID) | ORAL | Status: DC

## 2012-06-25 MED ORDER — ADULT MULTIVITAMIN LIQUID CH: 5.0000 mL | Freq: Every day | ORAL | Status: DC

## 2012-06-25 MED ORDER — ASPIRIN 81 MG PO CHEW: 81.0000 mg | CHEWABLE_TABLET | Freq: Every day | ORAL | Status: DC

## 2012-06-25 MED ORDER — BENAZEPRIL HCL 5 MG PO TABS: 5.0000 mg | ORAL_TABLET | Freq: Every day | ORAL | Status: DC

## 2012-06-25 MED ORDER — PREGABALIN 50 MG PO CAPS: 50.0000 mg | ORAL_CAPSULE | Freq: Three times a day (TID) | ORAL | Status: DC

## 2012-06-25 MED ORDER — GLIMEPIRIDE 2 MG PO TABS: 2.0000 mg | ORAL_TABLET | Freq: Every day | ORAL | Status: DC

## 2012-06-25 MED ORDER — DONEPEZIL HCL 10 MG PO TABS: 10.0000 mg | ORAL_TABLET | Freq: Every day | ORAL | Status: DC

## 2012-06-25 NOTE — Progress Notes (Signed)
Physical Therapy Note  Patient Details  Name: Glenda Gonzalez MRN: [id number] Date of Birth: 16-Apr-1934 Today's Date: 06/25/2012  Individual session 1001-1105 Pain: patient denies Treatment focused on safety with transfers and ambulation with walker, activities to promote step length and foot clearance, endurance activities and ankle AROM.  Patient ambulated to gym with walker supervision level cues for proximity to walker.  Negotiated 5 steps with rails with supervision.  Gait through floor ladder with walker min assist, then with hand held assist for step length and clearance.  Transferred sit to stand with cues for hand placement for safety.  Nu Step level 3 U/LE's x 5 minutes with one rest break, HR 78, SpO2 94% with mod c/o fatigue.  Gait to room with walker supervision one cue for staying inside walker.  Standing heel cord stretch 3x 20 seconds each foot with toe on 1 1/2 " binder.  Returned to bed at patient's request.  Sheran Lawless 06/25/2012, 1:16 PM

## 2012-06-25 NOTE — Discharge Summary (Signed)
NAME:  Glenda Gonzalez, DEBRULER NO.:  [id number]  MEDICAL RECORD NO.:  [id number]  LOCATION:  4149                         FACILITY:  [facility]  PHYSICIAN:  Erick Colace, M.D.DATE OF BIRTH:  [birth year]  DATE OF ADMISSION:  06/16/2012 DATE OF DISCHARGE:  06/26/2012                              DISCHARGE SUMMARY   DISCHARGE DIAGNOSES: 1. 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 2. Subcutaneous heparin for deep vein thrombosis prophylaxis. 3. Mood-dementia. 4. Seizure disorder, diabetes mellitus. 5. Peripheral neuropathy. 6. Hypertension, diastolic congestive heart failure. 7. Gastroesophageal reflux disease.  HISTORY OF PRESENT ILLNESS:  This is a 79 year old right-handed female with history of dementia, diabetes mellitus, peripheral neuropathy, admitted June 08, 2012, after being found down questionable seizure frothing from the mouth.  MRI of the brain showed subcentimeter solitary acute infarct right parietal gray matter junction without associated hemorrhage as well as advanced atrophy.  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 Echocardiogram with ejection fraction of 55%.  Normal systolic function. Carotid Dopplers of right 40%-59% ICA stenosis.  EEG showed no seizure activity.  Neurology consulted.  Loaded with Keppra for seizure. Aspirin was added for CVA prophylaxis.  Subcutaneous heparin for DVT prophylaxis.  Patient did require intubation for airway protection was extubated on June 10, 2012, without difficulty.  She was maintained on a regular consistency diet.  Physical and occupational therapy evaluation was completed ongoing recommendations physical medicine rehab consult.  As patient was admitted for comprehensive rehab program.  PAST MEDICAL HISTORY:  See discharge diagnoses.  SOCIAL HISTORY:  Lives with spouse and son, 1 level home with a ramped entrance.  Functional  history prior to admission, she needed basic assistance with activities of daily living, and meal preparation.  Functional status upon admission to rehab services was +2 total assist for stand pivot transfers, ambulating total assist with a rolling walker.  PHYSICAL EXAMINATION:  VITAL SIGNS:  Blood pressure 130/80, pulse 80, temperature 98.2, respirations 22. GENERAL:  This was an alert female. EYES:  Pupils round and reactive to light.  She made good eye contact with examiner. MENTATION:  Slow to process with delay but improved overall from prior consultation.  She had a mild left facial weakness.  She was able to provide biographical information. LUNGS:  Clear to auscultation. CARDIAC:  Rate controlled. ABDOMEN:  Soft, nontender.  Good bowel sounds.  REHABILITATION HOSPITAL COURSE:  Patient was admitted to inpatient rehab services with therapies initiated on a 3-hour daily basis consisting of physical therapy, occupational therapy, speech therapy, and rehabilitation nursing.  The following issues were addressed during the patient's rehabilitation stay.  Pertaining Ms. Polsky thrombotic right parietal infarct, remained on aspirin therapy.  Keppra ongoing at 500 mg b.i.d. for seizure disorder with no further seizure activity noted and EEG negative.  Subcutaneous heparin for DVT prophylaxis.  She did have a history of dementia maintained on Aricept.  Husband felt patient was at her baseline.  Diabetes mellitus with peripheral neuropathy.  She remained on low-dose Lantus insulin which was later discontinued and maintained on Amaryl 2 mg daily with diabetic teaching completed.  Blood pressures controlled on Lotensin as well  as labetalol with no orthostatic changes.  She would follow up with her primary MD.  She exhibited no signs of fluid overload.  Patient received weekly collaborative interdisciplinary team conferences to discuss estimated length of stay, family teaching, and any  barriers to discharge.  She was continent of bowel and bladder.  Overall supervision minimal assist for activities of daily living, supervision overall for functional mobility with good steady over all progress.  Husband and son were quite encouraged with overall progress.  Family teaching completed, and she was discharged to home with ongoing therapies.  DISCHARGE MEDICATIONS:  Aspirin 81 mg p.o. daily, Lotensin 5 mg p.o. daily, Lumigan ophthalmic solution 1 drop both eyes twice daily, Aricept 10 mg p.o. at bedtime, Amaryl 2 mg p.o. daily, labetalol 100 mg p.o. b.i.d.,  Keppra 500 mg p.o. b.i.d., .  Protonix 40 mg b.i.d., Lyrica 50 mg t.i.d., Senokot tab 1 at bedtime hold for loose stool.  DIET:  Diabetic diet.  SPECIAL INSTRUCTIONS:  Patient will follow up with Dr. Claudette Laws at the outpatient rehab center to July 16, 2012.  Outpatient neurology Services Dr. Leroy Kennedy 1 month as needed.  Dr. Billee Cashing, Medical Management appointment to be made.  Ongoing therapies were arranged per rehab services.     Mariam Dollar, P.A.   ______________________________ Erick Colace, M.D.    DA/MEDQ  D:  06/25/2012  T:  06/25/2012  Job:  [id number]  cc:   Donnal Moat, M.D.

## 2012-06-25 NOTE — Progress Notes (Signed)
Occukpational Therapy Note  Patient Details  Name: Glenda Gonzalez MRN: [id number] Date of Birth: 1933/11/27 Today's Date: 06/25/2012  [id number]  Individual session:  No Pain Treatment focused on ADL retraining at sink level (pt refused shower - does not shower at home).  Emphasized bed mobility, sit to stand, bed to wheelchair transfers, standing balance, increasing activity tolerance, functional ambulation with RW, safety, energy conservation.   Provided minimal cues for locking wc brakes.  Pt. Stood with distal supervision while bathing.  Pt. Transferred back to bed with call bell in place.    Humberto Seals 06/25/2012, 9:19 AM

## 2012-06-25 NOTE — Progress Notes (Signed)
Physical Therapy Note  Patient Details  Name: Glenda Gonzalez MRN: [id number] Date of Birth: Sep 30, 1933 Today's Date: 06/25/2012  Time: 1400-1500 60 minutes  No c/o pain.  Pt participated in group therapy session for gait training, activity tolerance and balance.  Pt performed gait with RW with supervision in controlled environment > 150', obstacle negotiation, side/backward stepping and stair negotiation with B handrails without LOB.  Standing ball toss with supervision 3 x 2 minutes for increased activity tolerance.  Group therapy   [name] 06/25/2012, 5:52 PM

## 2012-06-25 NOTE — Discharge Summary (Signed)
  Discharge summary job [id number]

## 2012-06-25 NOTE — Progress Notes (Signed)
Social Work Discharge Note Discharge Note  The overall goal for the admission was met for:   Discharge location: Yes-HOME WITH HUSBAND AND FAMILY TO PROVIDE 24 HR CARE  Length of Stay: Yes-10 DAYS  Discharge activity level: Yes-SUPERVISION LEVEL  Home/community participation: Yes  Services provided included: MD, RD, PT, OT, SLP, RN, Pharmacy and SW  Financial Services: Private Insurance: [employer]  Follow-up services arranged: Home Health: [facility]-PT, RN and Patient/Family has no preference for HH/DME agencies  Comments (or additional information):FAMILY EDUCATION COMPLETED ON 1/29.   Patient/Family verbalized understanding of follow-up arrangements: Yes  Individual responsible for coordination of the follow-up plan: TONY-HUSBAND & PAUL-SON  Confirmed correct DME delivered: Lucy Chris 06/25/2012    Lucy Chris

## 2012-06-25 NOTE — Progress Notes (Signed)
Patient ID: Glenda Gonzalez, female   DOB: 1933-09-24, 79 y.o.   MRN: [id number] 79 y.o. right-handed female with history of diabetes mellitus with peripheral neuropathy, dementia, coronary artery disease. Admitted 06/08/2012 after being found down questionable seizure frothing from the mouth. MRI of the brain showed subcentimeter solitary acute infarct right parietal gray matter junction without associated hemorrhage as was advanced atrophy. 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 Echocardiogram with ejection fraction of 55% and normal systolic function. Carotid Dopplers with right 40-59% ICA stenosis. EEG showed no seizure activity. Neurology consulted patient was loaded with Keppra for seizure. Aspirin was added for CVA prophylaxis. Subcutaneous heparin for DVT prophylaxis. Patient did require intubation for airway protection and was extubated 06/10/2012 without difficulty  Subjective/Complaints: Appetite excellent No more diarrhea  Review of Systems  Neurological: Positive for focal weakness. Negative for seizures.  All other systems reviewed and are negative.    Objective: Vital Signs: Blood pressure 132/90, pulse 57, temperature 98.3 F (36.8 C), temperature source Oral, resp. rate 18, weight 80.2 kg (176 lb 12.9 oz), SpO2 96.00%. No results found. Results for orders placed during the hospital encounter of 06/16/12 (from the past 72 hour(s))  GLUCOSE, CAPILLARY     Status: Abnormal   Collection Time   06/22/12 12:05 PM      Component Value Range Comment   Glucose-Capillary 132 (*) 70 - 99 mg/dL    Comment 1 Notify RN     GLUCOSE, CAPILLARY     Status: Abnormal   Collection Time   06/22/12  4:29 PM      Component Value Range Comment   Glucose-Capillary 128 (*) 70 - 99 mg/dL    Comment 1 Notify RN     GLUCOSE, CAPILLARY     Status: Abnormal   Collection Time   06/22/12  9:04 PM      Component Value Range Comment    Glucose-Capillary 160 (*) 70 - 99 mg/dL    Comment 1 Notify RN     GLUCOSE, CAPILLARY     Status: Abnormal   Collection Time   06/23/12  7:05 AM      Component Value Range Comment   Glucose-Capillary 153 (*) 70 - 99 mg/dL    Comment 1 Notify RN     CLOSTRIDIUM DIFFICILE BY PCR     Status: Normal   Collection Time   06/23/12  8:55 AM      Component Value Range Comment   C difficile by pcr NEGATIVE  NEGATIVE   GLUCOSE, CAPILLARY     Status: Abnormal   Collection Time   06/23/12 11:28 AM      Component Value Range Comment   Glucose-Capillary 146 (*) 70 - 99 mg/dL    Comment 1 Notify RN        Pt is obese  HENT: oral mucosa pink and moist  Head: Normocephalic.  Eyes:  Pupils reactive to light without nystagmus . EOMI grossly intact  Neck: Neck supple. No thyromegaly present. No jvd. No LAD  Cardiovascular: Normal rate and regular rhythm. No murmur  Pulmonary/Chest:  Decreased breath sounds  clear to auscultation  Abdominal: Soft. Bowel sounds are normal. No distention. Abdomen non tender. Neurological: She is alert.  Patient made good eye contact with examiner.Marland Kitchen Speech clear. Mild left facial weakness. She was able to give her name as well as place but had difficulty with age.. Follows one and two step commands. Slower  with left hand. Intentional tremor bilaterally. Strength 4/5 UE in bilateral deltoid, biceps, triceps, and HI and 3/5 proxBilateral LE HF and KE to 3-/5 with ADF and APF . No gross sensory deficits although both legs were hypersensitive to touch below the knees to feet.  Skin: Skin is warm and dry   Assessment/Plan: 1. Functional deficits secondary to Right parietal thrombotic infarct which require 3+ hours per day of interdisciplinary therapy in a comprehensive inpatient rehab setting. Should be ready for D/C in am FIM: FIM - Bathing Bathing Steps Patient Completed: Chest;Right Arm;Left Arm;Abdomen;Front perineal area;Buttocks;Right upper leg;Left upper leg (pt  states husband washed feet at home) Bathing: 4: Min-Patient completes 8-9 28f 10 parts or 75+ percent  FIM - Upper Body Dressing/Undressing Upper body dressing/undressing steps patient completed: Thread/unthread right sleeve of pullover shirt/dresss;Thread/unthread left sleeve of pullover shirt/dress;Put head through opening of pull over shirt/dress;Pull shirt over trunk Upper body dressing/undressing: 6: More than reasonable amount of time FIM - Lower Body Dressing/Undressing Lower body dressing/undressing steps patient completed: Thread/unthread right pants leg;Thread/unthread left pants leg;Pull pants up/down;Don/Doff right shoe;Don/Doff left shoe (brief teds and elastic shoe laces) Lower body dressing/undressing: 5: Supervision: Safety issues/verbal cues (5/5 appropriate tasks)  FIM - Toileting Toileting steps completed by patient: Adjust clothing prior to toileting;Performs perineal hygiene;Adjust clothing after toileting Toileting Assistive Devices: Grab bar or rail for support Toileting: 0: Activity did not occur  FIM - Diplomatic Services operational officer Devices: Elevated toilet seat;Grab bars;Walker Event organiser: 0-Activity did not occur  FIM - Banker Devices: Therapist, occupational: 5: Bed > Chair or W/C: Supervision (verbal cues/safety issues);5: Chair or W/C > Bed: Supervision (verbal cues/safety issues)  FIM - Locomotion: Wheelchair Distance: 60 Locomotion: Wheelchair: 0: Activity did not occur FIM - Locomotion: Ambulation Locomotion: Ambulation Assistive Devices: Designer, industrial/product Ambulation/Gait Assistance: 5: Supervision Locomotion: Ambulation: 5: Travels 150 ft or more with supervision/safety issues  Comprehension Comprehension Mode: Auditory Comprehension: 7-Follows complex conversation/direction: With no assist  Expression Expression Mode: Verbal Expression: 7-Expresses complex ideas: With no  assist  Social Interaction Social Interaction: 7-Interacts appropriately with others - No medications needed.  Problem Solving Problem Solving: 5-Solves basic 90% of the time/requires cueing < 10% of the time  Memory Memory: 5-Recognizes or recalls 90% of the time/requires cueing < 10% of the time  Medical Problem List and Plan:  1. Thrombotic right parietal infarct/seizure disorder  2. DVT Prophylaxis/Anticoagulation: Subcutaneous heparin. Monitor platelet counts and any signs of bleeding  3. Mood/dementia. Aricept 10 mg daily. Will discuss baseline cognition with family  4. Neuropsych: This patient is capable of making decisions on his/her own behalf.  5. Seizure disorder. EEG negative. Keppra 500 mg twice a day. Monitor for any seizure activity  6. Diabetes mellitus with peripheral neuropathy. . Amaryl 2mg   Check blood sugars a.c. and at bedtime. Patient on diet control prior to admission  7. Hypertension. Lotensin 5 mg daily, labetalol 100 mg twice a day. Monitor with increased activity  8. Diastolic congestive heart failure. Monitor for any signs of fluid overload. Patient on Lasix 20 mg daily prior to admission. Will resume as indicated  9. GERD. Protonix. hgb stable. LFT's ok. KUB normal. Feeling better today  -held multivitamin  -increased protonix to bid  -prn zofran 10.  Freq formed stools after constipation , will reduce senna S to qhs,Stool for C. Difficile - LOS (Days) 9 A FACE TO FACE EVALUATION WAS PERFORMED  Raghav Verrilli E 06/25/2012, 8:30 AM

## 2012-06-25 NOTE — Progress Notes (Signed)
Nutrition Brief Note Patient seen due to increased length of stay.    BMI:  24-WNL.  Patient meets criteria for wnl based on current BMI.   Current diet order is CHO MOD, patient is consuming approximately 100% of meals at this time. Labs and medications reviewed.   No nutrition interventions warranted at this time. If nutrition issues arise, please consult RD.   Oran Rein, RD, LDN Clinical Inpatient Dietitian Pager:  [phone] Weekend and after hours pager:  [phone]

## 2012-06-25 NOTE — Progress Notes (Signed)
Occupational Therapy Session Note  Patient Details  Name: Glenda Gonzalez MRN: [id number] Date of Birth: 1933-10-10  Today's Date: 06/25/2012 Time: 1301-1330 Time Calculation (min): 29 min  Skilled Therapeutic Interventions/Progress Updates:    Pt ambulated to the gym with her RW and close supervision.  She was also able to perform sit to stand with supervision as well throughout session.  In the gym used UE ergonometer for strengthening on level 5 resistance for 3 mins forward and reverse for two 2 minute intervals, with 2-3 minute rest break in between.  Pt able to keep revolutions per minute between 20-25 during intervals but voiced greater fatigue with peddling reverse.    Therapy Documentation Precautions:  Precautions Precautions: Fall Restrictions Weight Bearing Restrictions: No  Pain: Pain Assessment Pain Assessment: No/denies pain ADL: See FIM for current functional status  Therapy/Group: Individual Therapy  Qaadir Kent OTR/L 06/25/2012, 3:50 PM

## 2012-06-26 NOTE — Plan of Care (Signed)
Problem: RH KNOWLEDGE DEFICIT Goal: RH STG INCREASE KNOWLEDGE OF HYPERTENSION Patient and /or caregiver will verbalize strategies used for management of high blood pressure( diet, meds, f/u with appointment,monitoring, etc) Outcome: Not Met (add Reason) Patient reports "I just take what they give me"

## 2012-06-26 NOTE — Progress Notes (Signed)
Patient ID: Glenda Gonzalez, female   DOB: 10/16/1933, 79 y.o.   MRN: [id number] 79 y.o. right-handed female with history of diabetes mellitus with peripheral neuropathy, dementia, coronary artery disease. Admitted 06/08/2012 after being found down questionable seizure frothing from the mouth. MRI of the brain showed subcentimeter solitary acute infarct right parietal gray matter junction without associated hemorrhage as was advanced atrophy. 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 Echocardiogram with ejection fraction of 55% and normal systolic function. Carotid Dopplers with right 40-59% ICA stenosis. EEG showed no seizure activity. Neurology consulted patient was loaded with Keppra for seizure. Aspirin was added for CVA prophylaxis. Subcutaneous heparin for DVT prophylaxis. Patient did require intubation for airway protection and was extubated 06/10/2012 without difficulty  Subjective/Complaints:  No more diarrhea  Review of Systems  Neurological: Positive for focal weakness. Negative for seizures.  All other systems reviewed and are negative.    Objective: Vital Signs: Blood pressure 143/83, pulse 86, temperature 98.2 F (36.8 C), temperature source Oral, resp. rate 18, weight 80.2 kg (176 lb 12.9 oz), SpO2 96.00%. No results found. Results for orders placed during the hospital encounter of 06/16/12 (from the past 72 hour(s))  GLUCOSE, CAPILLARY     Status: Abnormal   Collection Time   06/23/12 11:28 AM      Component Value Range Comment   Glucose-Capillary 146 (*) 70 - 99 mg/dL    Comment 1 Notify RN        Pt is obese  HENT: oral mucosa pink and moist  Head: Normocephalic.  Eyes:  Pupils reactive to light without nystagmus . EOMI grossly intact  Neck: Neck supple. No thyromegaly present. No jvd. No LAD  Cardiovascular: Normal rate and regular rhythm. No murmur  Pulmonary/Chest:  Decreased breath sounds  clear to  auscultation  Abdominal: Soft. Bowel sounds are normal. No distention. Abdomen non tender. Neurological: She is alert.  Patient made good eye contact with examiner.Marland Kitchen Speech clear. Mild left facial weakness. She was able to give her name as well as place but had difficulty with age.. Follows one and two step commands. Slower with left hand. Intentional tremor bilaterally. Strength 4/5 UE in bilateral deltoid, biceps, triceps, and HI and 3/5 proxBilateral LE HF and KE to 3-/5 with ADF and APF . No gross sensory deficits although both legs were hypersensitive to touch below the knees to feet.  Skin: Skin is warm and dry   Assessment/Plan: 1. Functional deficits secondary to Right parietal thrombotic infarct which require 3+ hours per day of interdisciplinary therapy in a comprehensive inpatient rehab setting.  Stable for D/C today F/u PCP in 1-2 weeks F/u PM&R 3 weeks See D/C summary See D/C instructions FIM: FIM - Bathing Bathing Steps Patient Completed: Chest;Right Arm;Left Arm;Abdomen;Front perineal area;Buttocks;Right upper leg;Left upper leg;Right lower leg (including foot);Left lower leg (including foot) Bathing: 5: Set-up assist to: Open items  FIM - Upper Body Dressing/Undressing Upper body dressing/undressing steps patient completed: Thread/unthread right sleeve of pullover shirt/dresss;Thread/unthread left sleeve of pullover shirt/dress;Put head through opening of pull over shirt/dress;Pull shirt over trunk Upper body dressing/undressing: 6: More than reasonable amount of time FIM - Lower Body Dressing/Undressing Lower body dressing/undressing steps patient completed: Thread/unthread right pants leg;Thread/unthread left pants leg;Pull pants up/down;Don/Doff right shoe;Don/Doff left shoe Lower body dressing/undressing: 5: Set-up assist to: Obtain clothing  FIM - Toileting Toileting steps completed by patient: Adjust clothing prior to toileting;Performs perineal hygiene;Adjust  clothing after toileting  Toileting Assistive Devices: Grab bar or rail for support Toileting: 0: Activity did not occur  FIM - Diplomatic Services operational officer Devices: Elevated toilet seat;Grab bars;Walker Toilet Transfers: 0-Activity did not occur  FIM - Banker Devices: Arm rests;Walker Bed/Chair Transfer: 5: Supine > Sit: Supervision (verbal cues/safety issues);5: Sit > Supine: Supervision (verbal cues/safety issues);5: Bed > Chair or W/C: Supervision (verbal cues/safety issues);5: Chair or W/C > Bed: Supervision (verbal cues/safety issues)  FIM - Locomotion: Wheelchair Distance: 60 Locomotion: Wheelchair: 0: Activity did not occur FIM - Locomotion: Ambulation Locomotion: Ambulation Assistive Devices: Designer, industrial/product Ambulation/Gait Assistance: 5: Supervision Locomotion: Ambulation: 5: Travels 150 ft or more with supervision/safety issues  Comprehension Comprehension Mode: Auditory Comprehension: 7-Follows complex conversation/direction: With no assist  Expression Expression Mode: Verbal Expression: 7-Expresses complex ideas: With no assist  Social Interaction Social Interaction: 7-Interacts appropriately with others - No medications needed.  Problem Solving Problem Solving: 5-Solves basic 90% of the time/requires cueing < 10% of the time  Memory Memory: 5-Recognizes or recalls 90% of the time/requires cueing < 10% of the time  Medical Problem List and Plan:  1. Thrombotic right parietal infarct/seizure disorder  2. DVT Prophylaxis/Anticoagulation: Subcutaneous heparin. Monitor platelet counts and any signs of bleeding  3. Mood/dementia. Aricept 10 mg daily. Will discuss baseline cognition with family  4. Neuropsych: This patient is capable of making decisions on his/her own behalf.  5. Seizure disorder. EEG negative. Keppra 500 mg twice a day. Monitor for any seizure activity  6. Diabetes mellitus with peripheral  neuropathy. . Amaryl 2mg   Check blood sugars a.c. and at bedtime. Patient on diet control prior to admission  7. Hypertension. Lotensin 5 mg daily, labetalol 100 mg twice a day. Monitor with increased activity  8. Diastolic congestive heart failure. Monitor for any signs of fluid overload. Patient on Lasix 20 mg daily prior to admission. Will resume as indicated  9. GERD. Protonix. hgb stable. LFT's ok. KUB normal. Feeling better today  -held multivitamin  -increased protonix to bid  -prn zofran 10.  Freq formed stools after constipation , will reduce senna S to qhs,Stool for C. Difficile - LOS (Days) 10 A FACE TO FACE EVALUATION WAS PERFORMED  Erina Hamme E 06/26/2012, 9:46 AM

## 2012-06-26 NOTE — Progress Notes (Signed)
Patient discharged about 54 with family and discharge instructions given via Threasa Beards PA yesterday. Patient denied any pain. Patient denied any questions about discharge instructions. In regards to blood pressure medications patient stated "i just take what they tell me." Patient vitals stable. Patient to go home with family. Family member present and denied any questions.

## 2012-06-28 NOTE — Progress Notes (Signed)
Patient ID: Glenda Gonzalez, female   DOB: 1933-06-20, 79 y.o.   MRN: [id number] 79 y.o. right-handed female with history of diabetes mellitus with peripheral neuropathy, dementia, coronary artery disease. Admitted 06/08/2012 after being found down questionable seizure frothing from the mouth. MRI of the brain showed subcentimeter solitary acute infarct right parietal gray matter junction without associated hemorrhage as was advanced atrophy. 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 Echocardiogram with ejection fraction of 55% and normal systolic function. Carotid Dopplers with right 40-59% ICA stenosis. EEG showed no seizure activity. Neurology consulted patient was loaded with Keppra for seizure. Aspirin was added for CVA prophylaxis. Subcutaneous heparin for DVT prophylaxis. Patient did require intubation for airway protection and was extubated 06/10/2012 without difficulty  Subjective/Complaints: No problems overnight. No pain complaints  Review of Systems  Neurological: Positive for focal weakness. Negative for seizures.  All other systems reviewed and are negative.    Objective: Vital Signs: There were no vitals taken for this visit. No results found. No results found for this or any previous visit (from the past 72 hour(s)).   Pt is obese  HENT: oral mucosa pink and moist  Head: Normocephalic.  Eyes:  Pupils reactive to light without nystagmus . EOMI grossly intact  Neck: Neck supple. No thyromegaly present. No jvd. No LAD  Cardiovascular: Normal rate and regular rhythm. No murmur  Pulmonary/Chest:  Decreased breath sounds with improved effort and clear to auscultation  Abdominal: Soft. Bowel sounds are normal. She exhibits no distension.  Neurological: She is alert.  Patient made good eye contact with examiner.Marland Kitchen Speech clear. Mild left facial weakness. She was able to give her name as well as place but had difficulty with  age. She provided biographical information. Follows one and two step commands. Slower with left hand. Intentional tremor bilaterally. Strength 3/5 UE in deltoid, biceps, triceps, and HI and 2 prox LE HF and KE to 3/4 with ADF and APF . No gross sensory deficits although both legs were hypersensitive to touch below the knees to feet.  Skin: Skin is warm and dry   Assessment/Plan: 1. Functional deficits secondary to Right parietal thrombotic infarct which require 3+ hours per day of interdisciplinary therapy in a comprehensive inpatient rehab setting. Physiatrist is providing close team supervision and 24 hour management of active medical problems listed below. Physiatrist and rehab team continue to assess barriers to discharge/monitor patient progress toward functional and medical goals. FIM:                                  Medical Problem List and Plan:  1. Thrombotic right parietal infarct/seizure disorder  2. DVT Prophylaxis/Anticoagulation: Subcutaneous heparin. Monitor platelet counts and any signs of bleeding  3. Mood/dementia. Aricept 10 mg daily. Will discuss baseline cognition with family  4. Neuropsych: This patient is capable of making decisions on his/her own behalf.  5. Seizure disorder. EEG negative. Keppra 500 mg twice a day. Monitor for any seizure activity  6. Diabetes mellitus with peripheral neuropathy. Lantus insulin 10 units daily. Hemoglobin A1c pending. Check blood sugars a.c. and at bedtime. Patient on diet control prior to admission  7. Hypertension. Lotensin 5 mg daily, labetalol 100 mg twice a day. Monitor with increased activity  8. Diastolic congestive heart failure. Monitor for any signs of fluid overload. Patient on Lasix 20 mg daily prior to admission. Will  resume as indicated  9. GERD. Protonix   LOS (Days)  A FACE TO FACE EVALUATION WAS PERFORMED  KIRSTEINS,ANDREW E 06/28/2012, 8:17 AM

## 2012-06-29 LAB — GLUCOSE, CAPILLARY
Glucose-Capillary: 166 mg/dL — ABNORMAL HIGH (ref 70–99)
Glucose-Capillary: 192 mg/dL — ABNORMAL HIGH (ref 70–99)
Glucose-Capillary: 152 mg/dL — ABNORMAL HIGH (ref 70–99)
Glucose-Capillary: 246 mg/dL — ABNORMAL HIGH (ref 70–99)

## 2012-06-30 LAB — GLUCOSE, CAPILLARY
Glucose-Capillary: 113 mg/dL — ABNORMAL HIGH (ref 70–99)
Glucose-Capillary: 206 mg/dL — ABNORMAL HIGH (ref 70–99)
Glucose-Capillary: 164 mg/dL — ABNORMAL HIGH (ref 70–99)
Glucose-Capillary: 176 mg/dL — ABNORMAL HIGH (ref 70–99)

## 2012-07-02 ENCOUNTER — Emergency Department (HOSPITAL_COMMUNITY)
Admission: EM | Admit: 2012-07-02 | Discharge: 2012-07-02 | Disposition: A | Discharge status: Home / Self Care | Payer: Medicare Other | Attending: Emergency Medicine | Admitting: Emergency Medicine

## 2012-07-02 ENCOUNTER — Encounter (HOSPITAL_COMMUNITY): Payer: Self-pay | Admitting: *Deleted

## 2012-07-02 DIAGNOSIS — Z7982 Long term (current) use of aspirin: Secondary | ICD-10-CM | POA: Insufficient documentation

## 2012-07-02 DIAGNOSIS — Z8639 Personal history of other endocrine, nutritional and metabolic disease: Secondary | ICD-10-CM | POA: Insufficient documentation

## 2012-07-02 DIAGNOSIS — I509 Heart failure, unspecified: Secondary | ICD-10-CM | POA: Insufficient documentation

## 2012-07-02 DIAGNOSIS — Z8673 Personal history of transient ischemic attack (TIA), and cerebral infarction without residual deficits: Secondary | ICD-10-CM | POA: Insufficient documentation

## 2012-07-02 DIAGNOSIS — E119 Type 2 diabetes mellitus without complications: Secondary | ICD-10-CM | POA: Insufficient documentation

## 2012-07-02 DIAGNOSIS — Z8719 Personal history of other diseases of the digestive system: Secondary | ICD-10-CM | POA: Insufficient documentation

## 2012-07-02 DIAGNOSIS — I2510 Atherosclerotic heart disease of native coronary artery without angina pectoris: Secondary | ICD-10-CM | POA: Insufficient documentation

## 2012-07-02 DIAGNOSIS — F0390 Unspecified dementia without behavioral disturbance: Secondary | ICD-10-CM | POA: Insufficient documentation

## 2012-07-02 DIAGNOSIS — K219 Gastro-esophageal reflux disease without esophagitis: Secondary | ICD-10-CM | POA: Insufficient documentation

## 2012-07-02 DIAGNOSIS — Z79899 Other long term (current) drug therapy: Secondary | ICD-10-CM | POA: Insufficient documentation

## 2012-07-02 DIAGNOSIS — Z87891 Personal history of nicotine dependence: Secondary | ICD-10-CM | POA: Insufficient documentation

## 2012-07-02 DIAGNOSIS — I10 Essential (primary) hypertension: Secondary | ICD-10-CM | POA: Insufficient documentation

## 2012-07-02 DIAGNOSIS — Z862 Personal history of diseases of the blood and blood-forming organs and certain disorders involving the immune mechanism: Secondary | ICD-10-CM | POA: Insufficient documentation

## 2012-07-02 DIAGNOSIS — G40909 Epilepsy, unspecified, not intractable, without status epilepticus: Secondary | ICD-10-CM | POA: Insufficient documentation

## 2012-07-02 HISTORY — DX: Cerebral infarction, unspecified: I63.9

## 2012-07-02 HISTORY — DX: Unspecified convulsions: R56.9

## 2012-07-02 MED ORDER — BENAZEPRIL HCL 5 MG PO TABS: 10.0000 mg | ORAL_TABLET | Freq: Every day | ORAL | Status: DC

## 2012-07-02 NOTE — Progress Notes (Signed)
Physical Therapy Discharge Summary  Patient Details  Name: Glenda Gonzalez MRN: [id number] Date of Birth: 01-05-1934  Today's Date: 07/02/2012   Patient has met 11 of 11 long term goals due to improved activity tolerance, improved balance, improved postural control, increased strength, ability to compensate for deficits, functional use of  left lower extremity and improved awareness.  Patient to discharge at an ambulatory level Supervision.   Patient's care partner is independent to provide the necessary cognitive assistance at discharge.  Reasons goals not met: n/a  Recommendation:  Patient will benefit from ongoing skilled PT services in home health setting to continue to advance safe functional mobility, address ongoing impairments in muscle timing and sequencing, balance, activity tolerance, safety, and minimize fall risk.  Equipment: No equipment provided; pt owns RW and w/c  Reasons for discharge: treatment goals met and discharge from hospital  Patient/family agrees with progress made and goals achieved: Yes  PT Discharge Precautions/Restrictions Precautions Precautions: Fall Restrictions Weight Bearing Restrictions: No   Pain Pain Assessment Pain Assessment: No/denies pain    Cognition Orientation Level: Oriented X4 Sensation Sensation Light Touch: Impaired Detail Light Touch Impaired Details: Absent LLE (toes; diminished lower leg) Proprioception: Appears Intact Coordination Coordination and Movement Description: RUE tremor Heel Shin Test: decreased speed and accuracy LLE Motor  Motor Motor: Abnormal postural alignment and control;Hemiplegia Motor - Discharge Observations: postural asymmetries reduced since admission  Mobility Bed Mobility Bed Mobility:  (modified independent for all; extra time) Transfers Sit to Stand: 5: Supervision Sit to Stand Details: Verbal cues for precautions/safety Stand to Sit: 5: Supervision Stand to Sit Details (indicate cue  type and reason): Verbal cues for precautions/safety Locomotion  Ambulation Ambulation: Yes Ambulation/Gait Assistance: 5: Supervision Ambulation Distance (Feet): 150 Feet Assistive device: Rolling walker Ambulation/Gait Assistance Details: Visual cues for safe use of DME/AE;Verbal cues for precautions/safety Gait Gait: Yes Gait Pattern: Decreased hip/knee flexion - left;Trunk rotated posteriorly on left High Level Ambulation High Level Ambulation: Side stepping;Backwards walking Side Stepping: min assist Backwards Walking: min assist  Stairs / Additional Locomotion Stairs: Yes Stairs Assistance: 5: Supervision Stairs Assistance Details: Verbal cues for technique Stair Management Technique: Two rails Number of Stairs: 5  Height of Stairs: 7  Wheelchair Mobility Wheelchair Mobility: Yes Wheelchair Assistance: 5: Investment banker, operational: Both upper extremities Wheelchair Parts Management: Needs assistance Distance: 150  Trunk/Postural Assessment     Balance Balance Balance Assessed: Yes Standardized Balance Assessment Standardized Balance Assessment: Berg Balance Test (performed 06/24/12) Berg Balance Test Sit to Stand: Able to stand without using hands and stabilize independently Standing Unsupported: Able to stand safely 2 minutes Sitting with Back Unsupported but Feet Supported on Floor or Stool: Able to sit safely and securely 2 minutes Stand to Sit: Sits safely with minimal use of hands Transfers: Able to transfer safely, definite need of hands Standing Unsupported with Eyes Closed: Able to stand 10 seconds with supervision Standing Ubsupported with Feet Together: Needs help to attain position and unable to hold for 15 seconds From Standing, Reach Forward with Outstretched Arm: Reaches forward but needs supervision From Standing Position, Pick up Object from Floor: Able to pick up shoe, needs supervision From Standing Position, Turn to Look Behind Over each  Shoulder: Needs assist to keep from losing balance and falling Turn 360 Degrees: Needs close supervision or verbal cueing Standing Unsupported, Alternately Place Feet on Step/Stool: Needs assistance to keep from falling or unable to try Standing Unsupported, One Foot in Front: Loses balance while stepping or standing  Standing on One Leg: Unable to try or needs assist to prevent fall Total Score: 27  Static Sitting Balance Static Sitting - Level of Assistance: 5: Stand by assistance Static Standing Balance Static Standing - Level of Assistance: 5: Stand by assistance Dynamic Standing Balance Dynamic Standing - Level of Assistance: 5: Stand by assistance Extremity Assessment      RLE Assessment RLE Assessment: Within Functional Limits (using RW) LLE Assessment LLE Assessment: Within Functional Limits (with RW)  See FIM for current functional status  Erasmo Vertz 07/02/2012, 5:18 PM

## 2012-07-02 NOTE — ED Notes (Signed)
Pt sts sent here for HBP. sts nothing is wrong with her and she is not staying. sts feels fine.

## 2012-07-02 NOTE — ED Provider Notes (Signed)
History     CSN: [id number]  Arrival date & time 07/02/12  1325   First MD Initiated Contact with Patient 07/02/12 1500      No chief complaint on file.   (Consider location/radiation/quality/duration/timing/severity/associated sxs/prior treatment) HPI Comments: 79 year old female with a history of hypertension and a recent stroke who presents with hypertension. According to the patient and her family member she was recently seen this morning by a therapist at home who told her that her blood pressure was high and that she needed to be evaluated in the hospital. She denies any complaints whatsoever including chest pain, difficulty breathing, weakness, numbness, blurred vision or any other complaints. Her blood pressure on arrival was 170/92. She denies missing any doses of her medicines the  The history is provided by the patient and a relative.    Past Medical History  Diagnosis Date  . Diabetes mellitus   . GERD (gastroesophageal reflux disease)   . Hypertension   . Diverticulitis   . Gout   . Essential and other specified forms of tremor   . Dementia   . CAD (coronary artery disease)   . CHF (congestive heart failure)     Diastolic dysfunction  . Stroke   . Seizures     r/t stroke    Past Surgical History  Procedure Date  . Dilation and curettage of uterus   . Tumor removal     Family History  Problem Relation Age of Onset  . Colon cancer Neg Hx   . Heart disease Father   . Heart disease Brother     History  Substance Use Topics  . Smoking status: Former Games developer  . Smokeless tobacco: Never Used  . Alcohol Use: No    OB History    Grav Para Term Preterm Abortions TAB SAB Ect Mult Living                  Review of Systems  All other systems reviewed and are negative.    Allergies  Codeine; Fentanyl; Morphine; and Penicillins  Home Medications   Current Outpatient Rx  Name  Route  Sig  Dispense  Refill  . ACETAMINOPHEN 325 MG PO TABS   Oral  Take 325-650 mg by mouth every 6 (six) hours as needed. Headache or pain         . ASPIRIN 81 MG PO CHEW   Oral   Chew 1 tablet (81 mg total) by mouth daily.         Marland Kitchen BENAZEPRIL HCL 5 MG PO TABS   Oral   Take 2 tablets (10 mg total) by mouth daily.   30 tablet   1   . BIMATOPROST 0.03 % OP SOLN   Both Eyes   Place 1 drop into both eyes 2 (two) times daily.   2.5 mL   1   . DONEPEZIL HCL 10 MG PO TABS   Oral   Take 1 tablet (10 mg total) by mouth at bedtime.   30 tablet   1   . ESOMEPRAZOLE MAGNESIUM 40 MG PO CPDR   Oral   Take 40 mg by mouth 2 (two) times daily before a meal.         . ESOMEPRAZOLE MAGNESIUM 40 MG PO CPDR   Oral   Take 1 capsule (40 mg total) by mouth 2 (two) times daily before a meal.   60 capsule   1   . GLIMEPIRIDE 2 MG PO TABS  Oral   Take 1 tablet (2 mg total) by mouth daily before breakfast.   30 tablet   1   . LABETALOL HCL 100 MG PO TABS   Oral   Take 1 tablet (100 mg total) by mouth 2 (two) times daily.   60 tablet   1   . LEVETIRACETAM 500 MG PO TABS   Oral   Take 1 tablet (500 mg total) by mouth 2 (two) times daily.   60 tablet   1   . ADULT MULTIVITAMIN LIQUID CH   Per Tube   Place 5 mLs into feeding tube daily.         Frazier Butt ULTRA OP   Both Eyes   Place 1 drop into both eyes daily as needed. For dry eyes         . PREGABALIN 50 MG PO CAPS   Oral   Take 1 capsule (50 mg total) by mouth 3 (three) times daily.   90 capsule   1     BP 178/92  Pulse 72  Temp 98 F (36.7 C) (Oral)  Resp 20  SpO2 98%  Physical Exam  Nursing note and vitals reviewed. Constitutional: She appears well-developed and well-nourished. No distress.  HENT:  Head: Normocephalic and atraumatic.  Mouth/Throat: Oropharynx is clear and moist. No oropharyngeal exudate.  Eyes: Conjunctivae normal and EOM are normal. Pupils are equal, round, and reactive to light. Right eye exhibits no discharge. Left eye exhibits no discharge. No  scleral icterus.  Neck: Normal range of motion. Neck supple. No JVD present. No thyromegaly present.  Cardiovascular: Normal rate, regular rhythm, normal heart sounds and intact distal pulses.  Exam reveals no gallop and no friction rub.   No murmur heard. Pulmonary/Chest: Effort normal and breath sounds normal. No respiratory distress. She has no wheezes. She has no rales.  Abdominal: Soft. Bowel sounds are normal. She exhibits no distension and no mass. There is no tenderness.  Musculoskeletal: Normal range of motion. She exhibits no edema and no tenderness.  Lymphadenopathy:    She has no cervical adenopathy.  Neurological: She is alert. Coordination normal.  Skin: Skin is warm and dry. No rash noted. No erythema.  Psychiatric: She has a normal mood and affect. Her behavior is normal.    ED Course  Procedures (including critical care time)  Labs Reviewed - No data to display No results found.   1. Hypertension       MDM  The patient has no facial droop, she has normal strength and sensation in all 4 extremities no limb ataxia. Her mental status is normal, her blood pressure is 178/92, I will increase the dose of her benazepril to 10 mg once a day. She will be discharged in stable condition, to followup with her family Dr.       Vida Roller, MD 07/02/12 [phone]

## 2012-07-02 NOTE — ED Notes (Signed)
Pt was sent here by home health RN for hypertension (sbp of 190's).  Pt was d/c'd last Sat from [facility] after being tx for stroke/seizures.  Pt denies headaches or changes in vision.   Pressure 178/92 at this time.

## 2012-07-04 NOTE — Progress Notes (Signed)
Occupational Therapy Discharge Summary  Patient Details  Name: Glenda Gonzalez MRN: [id number] Date of Birth: Sep 17, 1933  Today's Date: 07/04/2012  Patient has met 7 of 7 long term goals due to improved activity tolerance, improved balance, postural control, ability to compensate for deficits, improved awareness and improved strength. .  Patient to discharge at overall Supervision to [facility] Assist level.  Patient's care partner is independent to provide the necessary physical and cognitive assistance at discharge.    Reasons goals not met: n/a secondary to all LTGs met  Recommendation:  Patient will benefit from ongoing skilled OT services in home health setting to continue to advance functional skills in the area of BADL, iADL and Reduce care partner burden.  Equipment: No equipment provided  Reasons for discharge: treatment goals met and discharge from hospital  Patient/family agrees with progress made and goals achieved: Yes  OT Discharge Precautions/Restrictions  Precautions Precautions: Fall Restrictions Weight Bearing Restrictions: No Pain Denies pain ADL See FIM for current functional status Vision/Perception  Vision - History Visual History: Glaucoma Patient Visual Report: No change from baseline Vision - Assessment Eye Alignment: Within Functional Limits Vision Assessment: Vision not tested Perception Perception: Within Functional Limits Praxis Praxis: Intact  Cognition Orientation Level: Oriented X4 Sensation Sensation Light Touch: Impaired Detail Light Touch Impaired Details: Absent LLE (toes; diminished lower leg) Stereognosis: Appears Intact Hot/Cold: Appears Intact Proprioception: Appears Intact Coordination Gross Motor Movements are Fluid and Coordinated: No Fine Motor Movements are Fluid and Coordinated: Yes Coordination and Movement Description: RUE tremor Motor  Motor Motor: Abnormal postural alignment and control;Hemiplegia Motor - Discharge  Observations: postural asymmetries reduced since admission Mobility  Bed Mobility Bed Mobility:  (modified independent for all; extra time) Transfers Sit to Stand: 5: Supervision Sit to Stand Details: Verbal cues for precautions/safety Stand to Sit: 5: Supervision Stand to Sit Details (indicate cue type and reason): Verbal cues for precautions/safety  Trunk/Postural Assessment  Cervical Assessment Cervical Assessment: Exceptions to [facility] Cervical Strength Overall Cervical Strength Comments: Pt with decreased cervical extension AROM, maintains slight cervical protraction and flexion in sitting and standing. Lumbar Assessment Lumbar Assessment: Exceptions to [facility] Lumbar Strength Overall Lumbar Strength Comments: Pt with flexed kyphotic posturing in standing.  Balance Standardized Balance Assessment: Berg Balance Test (performed 06/24/12) Total Score: 27 Static Sitting Balance Static Sitting - Level of Assistance: 5: Stand by assistance Static Standing Balance Static Standing - Level of Assistance: 5: Stand by assistance Dynamic Standing Balance Dynamic Standing - Level of Assistance: 5: Stand by assistance Extremity/Trunk Assessment RUE Assessment RUE Assessment: Within Functional Limits (overall strength 4/5 throughout) LUE Assessment LUE Assessment: Within Functional Limits (overall strength 4/5 throughout)  See FIM for current functional status  [name] 07/04/2012, 12:31 PM

## 2012-07-09 ENCOUNTER — Inpatient Hospital Stay (HOSPITAL_COMMUNITY)
Admission: EM | Admit: 2012-07-09 | Discharge: 2012-07-15 | DRG: 372 | Disposition: A | Discharge status: Home Health | Payer: Medicare Other | Attending: Internal Medicine | Admitting: Internal Medicine

## 2012-07-09 ENCOUNTER — Encounter (HOSPITAL_COMMUNITY): Payer: Self-pay | Admitting: Emergency Medicine

## 2012-07-09 ENCOUNTER — Emergency Department (HOSPITAL_COMMUNITY): Payer: Medicare Other

## 2012-07-09 DIAGNOSIS — G252 Other specified forms of tremor: Secondary | ICD-10-CM

## 2012-07-09 DIAGNOSIS — F0390 Unspecified dementia without behavioral disturbance: Secondary | ICD-10-CM | POA: Diagnosis present

## 2012-07-09 DIAGNOSIS — Z79899 Other long term (current) drug therapy: Secondary | ICD-10-CM

## 2012-07-09 DIAGNOSIS — J9600 Acute respiratory failure, unspecified whether with hypoxia or hypercapnia: Secondary | ICD-10-CM

## 2012-07-09 DIAGNOSIS — R112 Nausea with vomiting, unspecified: Secondary | ICD-10-CM

## 2012-07-09 DIAGNOSIS — D649 Anemia, unspecified: Secondary | ICD-10-CM

## 2012-07-09 DIAGNOSIS — N390 Urinary tract infection, site not specified: Secondary | ICD-10-CM

## 2012-07-09 DIAGNOSIS — E119 Type 2 diabetes mellitus without complications: Secondary | ICD-10-CM

## 2012-07-09 DIAGNOSIS — K219 Gastro-esophageal reflux disease without esophagitis: Secondary | ICD-10-CM

## 2012-07-09 DIAGNOSIS — E871 Hypo-osmolality and hyponatremia: Secondary | ICD-10-CM

## 2012-07-09 DIAGNOSIS — W19XXXA Unspecified fall, initial encounter: Secondary | ICD-10-CM

## 2012-07-09 DIAGNOSIS — I10 Essential (primary) hypertension: Secondary | ICD-10-CM

## 2012-07-09 DIAGNOSIS — R197 Diarrhea, unspecified: Secondary | ICD-10-CM

## 2012-07-09 DIAGNOSIS — E1142 Type 2 diabetes mellitus with diabetic polyneuropathy: Secondary | ICD-10-CM | POA: Diagnosis present

## 2012-07-09 DIAGNOSIS — Z8673 Personal history of transient ischemic attack (TIA), and cerebral infarction without residual deficits: Secondary | ICD-10-CM

## 2012-07-09 DIAGNOSIS — M109 Gout, unspecified: Secondary | ICD-10-CM

## 2012-07-09 DIAGNOSIS — R1084 Generalized abdominal pain: Secondary | ICD-10-CM

## 2012-07-09 DIAGNOSIS — E162 Hypoglycemia, unspecified: Secondary | ICD-10-CM

## 2012-07-09 DIAGNOSIS — G9340 Encephalopathy, unspecified: Secondary | ICD-10-CM

## 2012-07-09 DIAGNOSIS — Z87891 Personal history of nicotine dependence: Secondary | ICD-10-CM

## 2012-07-09 DIAGNOSIS — I2510 Atherosclerotic heart disease of native coronary artery without angina pectoris: Secondary | ICD-10-CM

## 2012-07-09 DIAGNOSIS — I739 Peripheral vascular disease, unspecified: Secondary | ICD-10-CM

## 2012-07-09 DIAGNOSIS — Z7982 Long term (current) use of aspirin: Secondary | ICD-10-CM

## 2012-07-09 DIAGNOSIS — K529 Noninfective gastroenteritis and colitis, unspecified: Secondary | ICD-10-CM

## 2012-07-09 DIAGNOSIS — I509 Heart failure, unspecified: Secondary | ICD-10-CM

## 2012-07-09 DIAGNOSIS — M1990 Unspecified osteoarthritis, unspecified site: Secondary | ICD-10-CM

## 2012-07-09 DIAGNOSIS — E1149 Type 2 diabetes mellitus with other diabetic neurological complication: Secondary | ICD-10-CM | POA: Diagnosis present

## 2012-07-09 DIAGNOSIS — I639 Cerebral infarction, unspecified: Secondary | ICD-10-CM

## 2012-07-09 DIAGNOSIS — R531 Weakness: Secondary | ICD-10-CM

## 2012-07-09 DIAGNOSIS — F068 Other specified mental disorders due to known physiological condition: Secondary | ICD-10-CM

## 2012-07-09 DIAGNOSIS — A0472 Enterocolitis due to Clostridium difficile, not specified as recurrent: Principal | ICD-10-CM

## 2012-07-09 DIAGNOSIS — N179 Acute kidney failure, unspecified: Secondary | ICD-10-CM

## 2012-07-09 DIAGNOSIS — R109 Unspecified abdominal pain: Secondary | ICD-10-CM

## 2012-07-09 DIAGNOSIS — G250 Essential tremor: Secondary | ICD-10-CM | POA: Diagnosis present

## 2012-07-09 DIAGNOSIS — G40909 Epilepsy, unspecified, not intractable, without status epilepticus: Secondary | ICD-10-CM | POA: Diagnosis present

## 2012-07-09 DIAGNOSIS — R7309 Other abnormal glucose: Secondary | ICD-10-CM

## 2012-07-09 DIAGNOSIS — G40901 Epilepsy, unspecified, not intractable, with status epilepticus: Secondary | ICD-10-CM

## 2012-07-09 DIAGNOSIS — R739 Hyperglycemia, unspecified: Secondary | ICD-10-CM

## 2012-07-09 DIAGNOSIS — E876 Hypokalemia: Secondary | ICD-10-CM

## 2012-07-09 LAB — CBC WITH DIFFERENTIAL/PLATELET
Basophils Absolute: 0 10*3/uL (ref 0.0–0.1)
Basophils Relative: 0 % (ref 0–1)
Eosinophils Absolute: 0 10*3/uL (ref 0.0–0.7)
MCH: 26.8 pg (ref 26.0–34.0)
MCHC: 32 g/dL (ref 30.0–36.0)
Monocytes Relative: 5 % (ref 3–12)
Neutro Abs: 7.2 10*3/uL (ref 1.7–7.7)
Neutrophils Relative %: 72 % (ref 43–77)
Platelets: 338 10*3/uL (ref 150–400)
RDW: 18.9 % — ABNORMAL HIGH (ref 11.5–15.5)

## 2012-07-09 LAB — COMPREHENSIVE METABOLIC PANEL
AST: 32 U/L (ref 0–37)
Albumin: 3.1 g/dL — ABNORMAL LOW (ref 3.5–5.2)
Alkaline Phosphatase: 163 U/L — ABNORMAL HIGH (ref 39–117)
BUN: 33 mg/dL — ABNORMAL HIGH (ref 6–23)
Chloride: 97 mEq/L (ref 96–112)
Creatinine, Ser: 1.41 mg/dL — ABNORMAL HIGH (ref 0.50–1.10)
Potassium: 4.9 mEq/L (ref 3.5–5.1)
Total Bilirubin: 0.3 mg/dL (ref 0.3–1.2)
Total Protein: 7.6 g/dL (ref 6.0–8.3)

## 2012-07-09 LAB — GLUCOSE, CAPILLARY: Glucose-Capillary: 307 mg/dL — ABNORMAL HIGH (ref 70–99)

## 2012-07-09 LAB — LACTIC ACID, PLASMA: Lactic Acid, Venous: 3.4 mmol/L — ABNORMAL HIGH (ref 0.5–2.2)

## 2012-07-09 LAB — POCT I-STAT, CHEM 8
Creatinine, Ser: 1.7 mg/dL — ABNORMAL HIGH (ref 0.50–1.10)
Hemoglobin: 12.6 g/dL (ref 12.0–15.0)
TCO2: 22 mmol/L (ref 0–100)

## 2012-07-09 MED ORDER — ACETAMINOPHEN 650 MG RE SUPP: 650.0000 mg | Freq: Four times a day (QID) | RECTAL | Status: DC | PRN

## 2012-07-09 MED ORDER — LABETALOL HCL 100 MG PO TABS
100.0000 mg | ORAL_TABLET | Freq: Two times a day (BID) | ORAL | Status: DC
  Administered 2012-07-10 – 2012-07-15 (×12): 100 mg via ORAL
  Filled 2012-07-09 (×14): qty 1

## 2012-07-09 MED ORDER — CIPROFLOXACIN IN D5W 400 MG/200ML IV SOLN
400.0000 mg | Freq: Once | INTRAVENOUS | Status: AC
  Administered 2012-07-09: 400 mg via INTRAVENOUS
  Filled 2012-07-09: qty 200

## 2012-07-09 MED ORDER — LEVETIRACETAM 500 MG PO TABS
500.0000 mg | ORAL_TABLET | Freq: Two times a day (BID) | ORAL | Status: DC
  Administered 2012-07-10 – 2012-07-15 (×12): 500 mg via ORAL
  Filled 2012-07-09 (×14): qty 1

## 2012-07-09 MED ORDER — POLYVINYL ALCOHOL 1.4 % OP SOLN
1.0000 [drp] | OPHTHALMIC | Status: DC | PRN
  Filled 2012-07-09: qty 15

## 2012-07-09 MED ORDER — PANTOPRAZOLE SODIUM 40 MG PO TBEC
40.0000 mg | DELAYED_RELEASE_TABLET | Freq: Every day | ORAL | Status: DC
  Administered 2012-07-10 – 2012-07-13 (×5): 40 mg via ORAL
  Filled 2012-07-09 (×5): qty 1

## 2012-07-09 MED ORDER — ONDANSETRON HCL 4 MG/2ML IJ SOLN: 4.0000 mg | Freq: Once | INTRAMUSCULAR | Status: DC

## 2012-07-09 MED ORDER — ENOXAPARIN SODIUM 30 MG/0.3ML ~~LOC~~ SOLN
30.0000 mg | SUBCUTANEOUS | Status: DC
  Administered 2012-07-10 – 2012-07-14 (×6): 30 mg via SUBCUTANEOUS
  Filled 2012-07-09 (×8): qty 0.3

## 2012-07-09 MED ORDER — PREGABALIN 50 MG PO CAPS
50.0000 mg | ORAL_CAPSULE | Freq: Three times a day (TID) | ORAL | Status: DC
  Administered 2012-07-10 – 2012-07-15 (×17): 50 mg via ORAL
  Filled 2012-07-09 (×17): qty 1

## 2012-07-09 MED ORDER — METRONIDAZOLE IN NACL 5-0.79 MG/ML-% IV SOLN
500.0000 mg | Freq: Once | INTRAVENOUS | Status: AC
  Administered 2012-07-09: 500 mg via INTRAVENOUS

## 2012-07-09 MED ORDER — SODIUM CHLORIDE 0.9 % IV BOLUS (SEPSIS)
1000.0000 mL | Freq: Once | INTRAVENOUS | Status: AC
  Administered 2012-07-09: 1000 mL via INTRAVENOUS

## 2012-07-09 MED ORDER — DONEPEZIL HCL 10 MG PO TABS
10.0000 mg | ORAL_TABLET | Freq: Every day | ORAL | Status: DC
  Administered 2012-07-10 – 2012-07-14 (×6): 10 mg via ORAL
  Filled 2012-07-09 (×8): qty 1

## 2012-07-09 MED ORDER — ASPIRIN 81 MG PO CHEW
81.0000 mg | CHEWABLE_TABLET | Freq: Every day | ORAL | Status: DC
  Administered 2012-07-10 – 2012-07-15 (×7): 81 mg via ORAL
  Filled 2012-07-09 (×7): qty 1

## 2012-07-09 MED ORDER — ACETAMINOPHEN 325 MG PO TABS: 650.0000 mg | ORAL_TABLET | Freq: Four times a day (QID) | ORAL | Status: DC | PRN

## 2012-07-09 MED ORDER — OXYCODONE HCL 5 MG PO TABS
5.0000 mg | ORAL_TABLET | ORAL | Status: DC | PRN
  Administered 2012-07-13 – 2012-07-14 (×4): 5 mg via ORAL
  Filled 2012-07-09 (×4): qty 1

## 2012-07-09 MED ORDER — HYDROMORPHONE HCL PF 1 MG/ML IJ SOLN
1.0000 mg | Freq: Once | INTRAMUSCULAR | Status: DC
  Filled 2012-07-09 (×2): qty 1

## 2012-07-09 MED ORDER — IOHEXOL 300 MG/ML  SOLN
50.0000 mL | Freq: Once | INTRAMUSCULAR | Status: AC | PRN
  Administered 2012-07-09: 50 mL via ORAL

## 2012-07-09 MED ORDER — INSULIN ASPART 100 UNIT/ML ~~LOC~~ SOLN
0.0000 [IU] | SUBCUTANEOUS | Status: DC
  Administered 2012-07-09: 3 [IU] via SUBCUTANEOUS
  Administered 2012-07-10: 2 [IU] via SUBCUTANEOUS
  Administered 2012-07-10: 1 [IU] via SUBCUTANEOUS
  Administered 2012-07-10 (×3): 2 [IU] via SUBCUTANEOUS
  Administered 2012-07-10: 13:00:00 via SUBCUTANEOUS
  Administered 2012-07-11: 1 [IU] via SUBCUTANEOUS
  Filled 2012-07-09: qty 1

## 2012-07-09 MED ORDER — ONDANSETRON HCL 4 MG PO TABS: 4.0000 mg | ORAL_TABLET | Freq: Four times a day (QID) | ORAL | Status: DC | PRN

## 2012-07-09 MED ORDER — CIPROFLOXACIN IN D5W 400 MG/200ML IV SOLN
400.0000 mg | Freq: Two times a day (BID) | INTRAVENOUS | Status: DC
  Administered 2012-07-10: 400 mg via INTRAVENOUS
  Filled 2012-07-09 (×2): qty 200

## 2012-07-09 MED ORDER — ZOLPIDEM TARTRATE 5 MG PO TABS
5.0000 mg | ORAL_TABLET | Freq: Every evening | ORAL | Status: DC | PRN
  Administered 2012-07-10: 5 mg via ORAL
  Filled 2012-07-09: qty 1

## 2012-07-09 MED ORDER — ONDANSETRON HCL 4 MG/2ML IJ SOLN
4.0000 mg | Freq: Four times a day (QID) | INTRAMUSCULAR | Status: DC | PRN
  Administered 2012-07-10 – 2012-07-12 (×2): 4 mg via INTRAVENOUS
  Filled 2012-07-09 (×2): qty 2

## 2012-07-09 MED ORDER — IOHEXOL 300 MG/ML  SOLN
80.0000 mL | Freq: Once | INTRAMUSCULAR | Status: AC | PRN
  Administered 2012-07-09: 80 mL via INTRAVENOUS

## 2012-07-09 MED ORDER — METRONIDAZOLE IN NACL 5-0.79 MG/ML-% IV SOLN
500.0000 mg | Freq: Three times a day (TID) | INTRAVENOUS | Status: DC
  Administered 2012-07-10 – 2012-07-11 (×4): 500 mg via INTRAVENOUS
  Filled 2012-07-09 (×7): qty 100

## 2012-07-09 MED ORDER — POLYETHYL GLYCOL-PROPYL GLYCOL 0.4-0.3 % OP SOLN: 1.0000 [drp] | OPHTHALMIC | Status: DC | PRN

## 2012-07-09 MED ORDER — HYDROMORPHONE HCL PF 1 MG/ML IJ SOLN
0.5000 mg | INTRAMUSCULAR | Status: DC | PRN
  Administered 2012-07-10: 0.25 mg via INTRAVENOUS
  Administered 2012-07-10: 0.5 mg via INTRAVENOUS
  Administered 2012-07-11: 0.25 mg via INTRAVENOUS
  Administered 2012-07-12 – 2012-07-14 (×4): 1 mg via INTRAVENOUS
  Filled 2012-07-09 (×7): qty 1

## 2012-07-09 MED ORDER — HYDROMORPHONE HCL PF 1 MG/ML IJ SOLN
1.0000 mg | Freq: Once | INTRAMUSCULAR | Status: AC
  Administered 2012-07-09: 1 mg via INTRAMUSCULAR
  Filled 2012-07-09: qty 1

## 2012-07-09 MED ORDER — HYDROMORPHONE HCL PF 1 MG/ML IJ SOLN
0.5000 mg | Freq: Once | INTRAMUSCULAR | Status: AC
  Administered 2012-07-09: 0.5 mg via INTRAVENOUS

## 2012-07-09 MED ORDER — ALUM & MAG HYDROXIDE-SIMETH 200-200-20 MG/5ML PO SUSP: 30.0000 mL | Freq: Four times a day (QID) | ORAL | Status: DC | PRN

## 2012-07-09 MED ORDER — SODIUM CHLORIDE 0.9 % IV SOLN
INTRAVENOUS | Status: DC
  Administered 2012-07-10 – 2012-07-11 (×3): via INTRAVENOUS

## 2012-07-09 NOTE — ED Notes (Signed)
Pt only has 1 IV access, Pt receiving Cipro IV first, Will start flagyl when 1st infusion is complete.

## 2012-07-09 NOTE — ED Notes (Signed)
Pt placed on bedpan, pt unable to void at this time.

## 2012-07-09 NOTE — ED Notes (Signed)
Onset one day ago after eating spaghetti developed general abdominal pain with nausea and emesis. Had one episode of diarrhea and now has normal bowel movements. Pain 10/10 achy sharp.

## 2012-07-09 NOTE — ED Notes (Signed)
Admitting physician at bedside

## 2012-07-09 NOTE — ED Notes (Signed)
Pt in CT, unable to introduce at this time and do primary assessment. Will complete upon arrival back to room.

## 2012-07-09 NOTE — ED Provider Notes (Signed)
History     CSN: [id number]  Arrival date & time 07/09/12  1447   First MD Initiated Contact with Patient 07/09/12 1617      Chief Complaint  Patient presents with  . Abdominal Pain  . Emesis    (Consider location/radiation/quality/duration/timing/severity/associated sxs/prior treatment) Patient is a 79 y.o. female presenting with vomiting. The history is provided by the patient, the spouse, a relative and medical records.  Emesis Associated symptoms: abdominal pain and diarrhea     Glenda Gonzalez is a 79 y.o. female  with a hx of abdominal surgery, abdominal tumor, attention, diabetes, diverticulitis, dementia, coronary disease, heart failure, stroke presents to the Emergency Department complaining of gradual, persistent, progressively worsening abdominal pain onset yesterday at 8 PM. Patient states she made and ate spaghetti for dinner along with the rest of her family and afterwards became ill with abdominal pain and vomiting. She has had 2-3 episodes of loose stool but no further diarrhea.  Associated symptoms include nausea, vomiting, abdominal pain.  Nothing makes it better and palpation makes it worse.  Pt denies fever, chills, headache, chest pain, shortness of breath, weakness, dizziness, dysuria, hematuria.     Past Medical History  Diagnosis Date  . Diabetes mellitus   . GERD (gastroesophageal reflux disease)   . Hypertension   . Diverticulitis   . Gout   . Essential and other specified forms of tremor   . Dementia   . CAD (coronary artery disease)   . CHF (congestive heart failure)     Diastolic dysfunction  . Stroke   . Seizures     r/t stroke    Past Surgical History  Procedure Laterality Date  . Dilation and curettage of uterus    . Tumor removal      Family History  Problem Relation Age of Onset  . Colon cancer Neg Hx   . Heart disease Father   . Heart disease Brother     History  Substance Use Topics  . Smoking status: Former Games developer  .  Smokeless tobacco: Never Used  . Alcohol Use: No    OB History   Grav Para Term Preterm Abortions TAB SAB Ect Mult Living                  Review of Systems  Constitutional: Negative for fever, diaphoresis, appetite change, fatigue and unexpected weight change.  HENT: Negative for mouth sores, trouble swallowing, neck pain and neck stiffness.   Respiratory: Negative for cough, chest tightness, shortness of breath, wheezing and stridor.   Cardiovascular: Negative for chest pain and palpitations.  Gastrointestinal: Positive for nausea, vomiting, abdominal pain and diarrhea. Negative for constipation, blood in stool, abdominal distention and rectal pain.  Genitourinary: Negative for dysuria, urgency, frequency, hematuria, flank pain and difficulty urinating.  Musculoskeletal: Negative for back pain.  Skin: Negative for rash.  Neurological: Negative for weakness.  Hematological: Negative for adenopathy.  Psychiatric/Behavioral: Negative for confusion.  All other systems reviewed and are negative.    Allergies  Codeine; Fentanyl; Morphine; and Penicillins  Home Medications   Current Outpatient Rx  Name  Route  Sig  Dispense  Refill  . aspirin 81 MG chewable tablet   Oral   Chew 81 mg by mouth daily.         . benazepril (LOTENSIN) 5 MG tablet   Oral   Take 5 mg by mouth daily.         . bimatoprost (LUMIGAN) 0.01 % SOLN  Ophthalmic   Apply 1 drop to eye 2 (two) times daily.         Marland Kitchen donepezil (ARICEPT) 10 MG tablet   Oral   Take 10 mg by mouth at bedtime.         Marland Kitchen esomeprazole (NEXIUM) 40 MG capsule   Oral   Take 40 mg by mouth 3 (three) times daily.         Marland Kitchen glimepiride (AMARYL) 2 MG tablet   Oral   Take 2 mg by mouth daily before breakfast.         . labetalol (NORMODYNE) 100 MG tablet   Oral   Take 100 mg by mouth 2 (two) times daily.         Marland Kitchen levETIRAcetam (KEPPRA) 500 MG tablet   Oral   Take 500 mg by mouth 2 (two) times daily.           . pantoprazole (PROTONIX) 40 MG tablet   Oral   Take 40 mg by mouth 2 (two) times daily.         Bertram Gala Glycol-Propyl Glycol (SYSTANE ULTRA OP)   Both Eyes   Place 1 drop into both eyes daily as needed. For dry eyes         . pregabalin (LYRICA) 50 MG capsule   Oral   Take 50 mg by mouth 3 (three) times daily.         Marland Kitchen senna (SENOKOT) 8.6 MG TABS   Oral   Take 1 tablet by mouth at bedtime.         Marland Kitchen zolpidem (AMBIEN) 10 MG tablet   Oral   Take 10 mg by mouth at bedtime as needed for sleep.           BP 101/70  Pulse 78  Temp(Src) 98 F (36.7 C) (Oral)  Resp 17  SpO2 98%  Physical Exam  Nursing note and vitals reviewed. Constitutional: She is oriented to person, place, and time. She appears well-developed and well-nourished.  HENT:  Head: Normocephalic and atraumatic.  Mouth/Throat: Oropharynx is clear and moist.  Eyes: Conjunctivae and EOM are normal. Pupils are equal, round, and reactive to light. No scleral icterus.  Neck: Normal range of motion.  Cardiovascular: Normal rate, regular rhythm, normal heart sounds and intact distal pulses.  Exam reveals no gallop and no friction rub.   No murmur heard. Pulmonary/Chest: Effort normal and breath sounds normal. No respiratory distress. She has no wheezes. She has no rales. She exhibits no tenderness.  Abdominal: Soft. Bowel sounds are normal. She exhibits no distension and no mass. There is generalized tenderness (significant). There is guarding. There is no rigidity, no rebound and no CVA tenderness.    Musculoskeletal: She exhibits edema (at baseline, nonpitting). She exhibits no tenderness.  Lymphadenopathy:    She has no cervical adenopathy.  Neurological: She is alert and oriented to person, place, and time. She exhibits normal muscle tone. Coordination normal.  Skin: Skin is warm and dry. No rash noted. No erythema.  Psychiatric: She has a normal mood and affect.    ED Course  Procedures  (including critical care time)  Labs Reviewed  CBC WITH DIFFERENTIAL - Abnormal; Notable for the following:    RDW 18.9 (*)    All other components within normal limits  COMPREHENSIVE METABOLIC PANEL - Abnormal; Notable for the following:    Sodium 132 (*)    Glucose, Bld 336 (*)    BUN 33 (*)  Creatinine, Ser 1.41 (*)    Albumin 3.1 (*)    Alkaline Phosphatase 163 (*)    GFR calc non Af Amer 35 (*)    GFR calc Af Amer 41 (*)    All other components within normal limits  GLUCOSE, CAPILLARY - Abnormal; Notable for the following:    Glucose-Capillary 307 (*)    All other components within normal limits  LACTIC ACID, PLASMA - Abnormal; Notable for the following:    Lactic Acid, Venous 3.4 (*)    All other components within normal limits  CG4 I-STAT (LACTIC ACID) - Abnormal; Notable for the following:    Lactic Acid, Venous 3.39 (*)    All other components within normal limits  POCT I-STAT, CHEM 8 - Abnormal; Notable for the following:    BUN 37 (*)    Creatinine, Ser 1.70 (*)    Glucose, Bld 233 (*)    All other components within normal limits  LIPASE, BLOOD  URINALYSIS, ROUTINE W REFLEX MICROSCOPIC   Ct Abdomen Pelvis W Contrast  07/09/2012  *RADIOLOGY REPORT*  Clinical Data: 79 year old female with left-sided abdominal and pelvic pain.  CT ABDOMEN AND PELVIS WITH CONTRAST  Technique:  Multidetector CT imaging of the abdomen and pelvis was performed following the standard protocol during bolus administration of intravenous contrast.  Contrast: 80mL OMNIPAQUE IOHEXOL 300 MG/ML  SOLN  Comparison: 03/08/2012 CT  Findings: There is diffuse circumferential wall thickening of a large segment of mid small bowel, primarily within the left and lower abdomen and pelvis.  A small amount of ascites is noted. There is no evidence of pneumoperitoneum, bowel obstruction or pneumatosis intestinalis. Omental stranding in the left abdomen noted and may be secondary to the enteritis.  The liver,  spleen, adrenal glands, pancreas and gallbladder are unremarkable. Bilateral renal cortical atrophy and left renal scarring noted.  There is no evidence of biliary dilatation, enlarged lymph nodes or abdominal aortic aneurysm.  The visualized mesenteric arteries are patent.  Hysterectomy and pessary again noted. A curvilinear density within the sigmoid colon is unchanged. Postoperative changes within the anterior abdominal/pelvic wall again noted.  No acute or suspicious bony abnormalities are noted.  IMPRESSION: Diffuse long segment circumferential small bowel wall thickening and adjacent inflammation with small amount of ascites - likely inflammatory or infectious enteritis. No evidence of bowel obstruction or pneumoperitoneum.  Renal atrophy and left renal scarring.   Original Report Authenticated By: Harmon Pier, M.D.      1. Enteritis   2. HYPERTENSION   3. DM   4. CAD   5. Hyperglycemia   6. Acute kidney injury       MDM  Glenda Gonzalez resents for abdominal pain.  pain somewhat out of proportion.  Patient with history of abdominal surgery. Concern for bowel obstruction/perforation vs mesenteric ischemia vs return of the tumor, or possibly other acute abdominal concerns.  Is without appendix.  Pt also with hyperglycemia at 336 and anion gap of 15.    Pain management, fluid bolus and further labs and imaging ordered.  Patient with decrease in glucose but increasing BUN and creatinine. Lactic acid 3.4  Patient afebrile, NAD, nontoxic, nonseptic appearing and persistent abdominal pain. CT with diffuse long segment segmental small bowel wall thickening likely inflammatory or infectious enteritis. No evidence of bowel structure.  Discussed with [facility] hospital[facility] the patient will be admitted for overnight observation.      Dahlia Client Nasia Cannan, PA-C 07/10/12 [phone]

## 2012-07-09 NOTE — ED Notes (Signed)
IV team at bedside 

## 2012-07-09 NOTE — ED Notes (Signed)
Pt placed on bedpan

## 2012-07-09 NOTE — ED Notes (Signed)
Spoke with IV team to come start IV on pt and possibly get blood

## 2012-07-09 NOTE — H&P (Signed)
[facility] History and Physical  [name] QIH:[id number] DOB: 1933-11-28 DOA: 07/09/2012  Referring physician: EDP PCP: Billee Cashing, MD  Specialists:   Chief Complaint: Nausea Vomiting Diarrhea and ABD Pain  HPI: Glenda Gonzalez is a 79 y.o. female who presents to the ED with complaints of  Severe ABD pain, nausea, vomiting and diarrhea X 24 hours.  Her ABD pain is diffuse.  She denies having fevers or chills or blood in her emesis or stool.  She reports not being able to hold down foods or liquids.  She was recently hospitalized.       Review of Systems: The patient denies anorexia, fever, chills, weight loss, vision loss, decreased hearing, hoarseness, chest pain, syncope, dyspnea on exertion, peripheral edema, balance deficits, hemoptysis, hematemesis, melena, hematochezia, severe indigestion/heartburn, hematuria, incontinence, dysuria, suspicious skin lesions, transient blindness, difficulty walking, depression, unusual weight change, abnormal bleeding, enlarged lymph nodes, angioedema, and breast masses.    Past Medical History  Diagnosis Date  . Diabetes mellitus   . GERD (gastroesophageal reflux disease)   . Hypertension   . Diverticulitis   . Gout   . Essential and other specified forms of tremor   . Dementia   . CAD (coronary artery disease)   . CHF (congestive heart failure)     Diastolic dysfunction  . Stroke   . Seizures     r/t stroke   Past Surgical History  Procedure Laterality Date  . Dilation and curettage of uterus    . Tumor removal      Medications:  HOME MEDS: Prior to Admission medications   Medication Sig Start Date End Date Taking? Authorizing Provider  aspirin 81 MG chewable tablet Chew 81 mg by mouth daily.   Yes Historical Provider, MD  benazepril (LOTENSIN) 5 MG tablet Take 5 mg by mouth daily.   Yes Historical Provider, MD  bimatoprost (LUMIGAN) 0.01 % SOLN Apply 1 drop to eye 2 (two) times daily.   Yes Historical  Provider, MD  donepezil (ARICEPT) 10 MG tablet Take 10 mg by mouth at bedtime.   Yes Historical Provider, MD  esomeprazole (NEXIUM) 40 MG capsule Take 40 mg by mouth 3 (three) times daily.   Yes Historical Provider, MD  glimepiride (AMARYL) 2 MG tablet Take 2 mg by mouth daily before breakfast.   Yes Historical Provider, MD  labetalol (NORMODYNE) 100 MG tablet Take 100 mg by mouth 2 (two) times daily.   Yes Historical Provider, MD  levETIRAcetam (KEPPRA) 500 MG tablet Take 500 mg by mouth 2 (two) times daily.   Yes Historical Provider, MD  pantoprazole (PROTONIX) 40 MG tablet Take 40 mg by mouth 2 (two) times daily.   Yes Historical Provider, MD  Polyethyl Glycol-Propyl Glycol (SYSTANE ULTRA OP) Place 1 drop into both eyes daily as needed. For dry eyes   Yes Historical Provider, MD  pregabalin (LYRICA) 50 MG capsule Take 50 mg by mouth 3 (three) times daily.   Yes Historical Provider, MD  senna (SENOKOT) 8.6 MG TABS Take 1 tablet by mouth at bedtime.   Yes Historical Provider, MD  zolpidem (AMBIEN) 10 MG tablet Take 10 mg by mouth at bedtime as needed for sleep.   Yes Historical Provider, MD    Allergies:  Allergies  Allergen Reactions  . Codeine Other (See Comments)    hallucination  . Fentanyl Other (See Comments)    Abnormal behavior per husband  . Morphine Itching  . Penicillins Itching    Social History:  reports that she has quit smoking. She has never used smokeless tobacco. She reports that she does not drink alcohol or use illicit drugs.  Family History: Family History  Problem Relation Age of Onset  . Colon cancer Neg Hx   . Heart disease Father   . Heart disease Brother     Physical Exam:  GEN:  Pleasant Obese Elderly 79 year old [name] examined  and in discomfort but no acute distress; cooperative with exam Filed Vitals:   07/09/12 1815 07/09/12 1845 07/09/12 2038 07/09/12 2041  BP: 156/48 101/70 139/66   Pulse: 78 78 76   Temp:    97.7 F (36.5  C)  TempSrc:    Oral  Resp: 19 17 12    SpO2: 100% 98% 98%    Blood pressure 139/66, pulse 76, temperature 97.7 F (36.5 C), temperature source Oral, resp. rate 12, SpO2 98.00%. PSYCH: She is alert and oriented x4; does not appear anxious does not appear depressed; affect is normal HEENT: Normocephalic and Atraumatic, Mucous membranes pink; PERRLA; EOM intact; Fundi:  Benign;  No scleral icterus, Nares: Patent, Oropharynx: Clear, Edentulous; Neck:  FROM, no cervical lymphadenopathy nor thyromegaly or carotid bruit; no JVD; Breasts:: Not examined CHEST WALL: No tenderness CHEST: Normal respiration, clear to auscultation bilaterally HEART: Regular rate and rhythm; no murmurs rubs or gallops BACK: No kyphosis or scoliosis; no CVA tenderness ABDOMEN: Positive Bowel Sounds, Obese, soft non-tender; no masses, no organomegaly, no pannus; no intertriginous candida. Rectal Exam: Not done EXTREMITIES: No bone or joint deformity; age-appropriate arthropathy of the hands and knees; no cyanosis, clubbing or edema; no ulcerations. Genitalia: not examined PULSES: 2+ and symmetric SKIN: Normal hydration no rash or ulceration CNS: Cranial nerves 2-12 grossly intact no focal neurologic deficit    Labs on Admission:  Basic Metabolic Panel:  Recent Labs Lab 07/09/12 1508 07/09/12 2016  NA 132* 135  K 4.9 4.1  CL 97 105  CO2 20  --   GLUCOSE 336* 233*  BUN 33* 37*  CREATININE 1.41* 1.70*  CALCIUM 9.6  --    Liver Function Tests:  Recent Labs Lab 07/09/12 1508  AST 32  ALT 17  ALKPHOS 163*  BILITOT 0.3  PROT 7.6  ALBUMIN 3.1*    Recent Labs Lab 07/09/12 1650  LIPASE 47   No results found for this basename: AMMONIA,  in the last 168 hours CBC:  Recent Labs Lab 07/09/12 1508 07/09/12 2016  WBC 10.0  --   NEUTROABS 7.2  --   HGB 12.3 12.6  HCT 38.4 37.0  MCV 83.7  --   PLT 338  --    Cardiac Enzymes: No results found for this basename: CKTOTAL, CKMB, CKMBINDEX,  TROPONINI,  in the last 168 hours  BNP (last 3 results)  Recent Labs  06/08/12 2309  PROBNP 234.3   CBG:  Recent Labs Lab 07/09/12 1517  GLUCAP 307*    Radiological Exams on Admission: Ct Abdomen Pelvis W Contrast  07/09/2012  *RADIOLOGY REPORT*  Clinical Data: 79 year old female with left-sided abdominal and pelvic pain.  CT ABDOMEN AND PELVIS WITH CONTRAST  Technique:  Multidetector CT imaging of the abdomen and pelvis was performed following the standard protocol during bolus administration of intravenous contrast.  Contrast: 80mL OMNIPAQUE IOHEXOL 300 MG/ML  SOLN  Comparison: 03/08/2012 CT  Findings: There is diffuse circumferential wall thickening of a large segment of mid small bowel, primarily within the left and lower abdomen and pelvis.  A small  amount of ascites is noted. There is no evidence of pneumoperitoneum, bowel obstruction or pneumatosis intestinalis. Omental stranding in the left abdomen noted and may be secondary to the enteritis.  The liver, spleen, adrenal glands, pancreas and gallbladder are unremarkable. Bilateral renal cortical atrophy and left renal scarring noted.  There is no evidence of biliary dilatation, enlarged lymph nodes or abdominal aortic aneurysm.  The visualized mesenteric arteries are patent.  Hysterectomy and pessary again noted. A curvilinear density within the sigmoid colon is unchanged. Postoperative changes within the anterior abdominal/pelvic wall again noted.  No acute or suspicious bony abnormalities are noted.  IMPRESSION: Diffuse long segment circumferential small bowel wall thickening and adjacent inflammation with small amount of ascites - likely inflammatory or infectious enteritis. No evidence of bowel obstruction or pneumoperitoneum.  Renal atrophy and left renal scarring.   Original Report Authenticated By: Harmon Pier, M.D.        Assessment/Plan Principal Problem:   Enteritis Active Problems:   ARF (acute renal failure)    Hyperglycemia   Nausea & vomiting   Diarrhea   Abdominal pain, diffuse   DM    1.   Enteritis/nausea/ vomiting /diarrhea-   Placed in Enteric Precautions, Stool for C+S, and C.Diff PCR ordered.  Placed on IV Cipro and Flagyl based on CT scan results.    2.  ARF due to # 1 Genltle IVFs ordered for rehydration.   Hold ACE and ARB therapies  3.  Hyperglycemia/DM2-  SSI coverage PRN, but Hold Amaryl for now due to decreased PO intake.     4.  ABD Pain-  Pain Conrtol PRN.     5.  Other- Medications reconciled and placed on DVT prophylaxis with Lovenox.         Code Status:     FULL CODE Family Communication:    No Family Present Disposition Plan:      Return to Home on Discharge  Time spent: 67 Minutes  Ron Parker [facility] Pager [phone]    If 7PM-7AM, please contact night-coverage [email or web address] Password [email or web address] 07/09/2012, 9:19 PM

## 2012-07-09 NOTE — ED Notes (Signed)
Pt family went home for the night. Will return in the morning.

## 2012-07-10 ENCOUNTER — Encounter (HOSPITAL_COMMUNITY): Payer: Self-pay | Admitting: *Deleted

## 2012-07-10 LAB — HEMOGLOBIN A1C
Hgb A1c MFr Bld: 10.8 % — ABNORMAL HIGH
Mean Plasma Glucose: 263 mg/dL — ABNORMAL HIGH

## 2012-07-10 LAB — CBC
Hemoglobin: 10.4 g/dL — ABNORMAL LOW (ref 12.0–15.0)
MCHC: 32 g/dL (ref 30.0–36.0)
WBC: 7.5 10*3/uL (ref 4.0–10.5)

## 2012-07-10 LAB — URINALYSIS, ROUTINE W REFLEX MICROSCOPIC
Protein, ur: 100 mg/dL — AB
Specific Gravity, Urine: >1.046 — ABNORMAL HIGH (ref 1.005–1.030)
Urobilinogen, UA: 1 mg/dL (ref 0.0–1.0)

## 2012-07-10 LAB — GLUCOSE, CAPILLARY
Glucose-Capillary: 172 mg/dL — ABNORMAL HIGH (ref 70–99)
Glucose-Capillary: 191 mg/dL — ABNORMAL HIGH (ref 70–99)
Glucose-Capillary: 173 mg/dL — ABNORMAL HIGH (ref 70–99)
Glucose-Capillary: 136 mg/dL — ABNORMAL HIGH (ref 70–99)
Glucose-Capillary: 127 mg/dL — ABNORMAL HIGH (ref 70–99)

## 2012-07-10 LAB — BASIC METABOLIC PANEL
BUN: 41 mg/dL — ABNORMAL HIGH (ref 6–23)
Chloride: 101 mEq/L (ref 96–112)
GFR calc Af Amer: 29 mL/min — ABNORMAL LOW (ref >90)
GFR calc non Af Amer: 25 mL/min — ABNORMAL LOW (ref >90)
Potassium: 4.7 mEq/L (ref 3.5–5.1)
Sodium: 133 mEq/L — ABNORMAL LOW (ref 135–145)

## 2012-07-10 LAB — URINE MICROSCOPIC-ADD ON

## 2012-07-10 MED ORDER — SODIUM CHLORIDE 0.9 % IV SOLN: INTRAVENOUS | Status: DC

## 2012-07-10 MED ORDER — ONDANSETRON HCL 4 MG/2ML IJ SOLN: 4.0000 mg | Freq: Three times a day (TID) | INTRAMUSCULAR | Status: DC | PRN

## 2012-07-10 MED ORDER — CIPROFLOXACIN IN D5W 400 MG/200ML IV SOLN
400.0000 mg | Freq: Two times a day (BID) | INTRAVENOUS | Status: DC
  Administered 2012-07-10 – 2012-07-11 (×3): 400 mg via INTRAVENOUS
  Filled 2012-07-10 (×5): qty 200

## 2012-07-10 MED ORDER — VANCOMYCIN 50 MG/ML ORAL SOLUTION
125.0000 mg | Freq: Four times a day (QID) | ORAL | Status: DC
  Administered 2012-07-10 – 2012-07-15 (×20): 125 mg via ORAL
  Filled 2012-07-10 (×25): qty 2.5

## 2012-07-10 MED ORDER — HYDROMORPHONE HCL PF 1 MG/ML IJ SOLN: 1.0000 mg | INTRAMUSCULAR | Status: AC | PRN

## 2012-07-10 NOTE — Progress Notes (Signed)
MD, nurse was unable to collect urine and stool specimens due to mixing of output on several occasions.  Please address.  Thanks

## 2012-07-10 NOTE — Progress Notes (Signed)
[facility] PROGRESS NOTE  Glenda Gonzalez MWU:[id number] DOB: Oct 23, 1933 DOA: 07/09/2012 PCP: Glenda Cashing, MD  Brief narrative: History of diabetes mellitus with peripheral neuropathy, dementia, coronary artery disease. Recently admitted 06/08/2012 with questionable seizures, MRI of the brain showed subcentimeter solitary acute infarct right parietal gray matter junction without associated hemorrhage as was advanced atrophy. 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 Echocardiogram with ejection fraction of 55% and normal systolic function. Carotid Dopplers with right 40-59% ICA stenosis. EEG showed no seizure activity. Neurology consulted patient was loaded with Keppra for seizure. Aspirin was added for CVA prophylaxis. Subcutaneous heparin for DVT prophylaxis. Patient did require intubation for airway protection and was extubated 06/10/2012 without difficulty.   Presenting now with NVD, abdominal pain x 24 hours.   Assessment/Plan: 1. Enteritis/nausea/ vomiting /diarrhea- on Enteric Precautions, Stool for C+S, and C.Diff PCR pending this morning. Placed on IV Cipro and Flagyl empirically based on CT scan results.  2. ARF due to # 1 Genltle IVFs ordered for rehydration. Hold ACE and ARB therapies  3. Hyperglycemia/DM2- SSI coverage PRN, but Hold Amaryl for now due to decreased PO intake.  4. ABD Pain- Pain Conrtol PRN.  5. Other- Medications reconciled and placed on DVT prophylaxis with Lovenox.   Code Status: Full Family Communication: none  Disposition Plan: supportive treatment   Consultants:  none  Procedures:  none  Antibiotics:  Cipro/Flagyl 2/14 >>   HPI/Subjective: - sleeping this morning, easily arousable, can tell me name and that she has abdominal pain, mild. Follows commands.   Objective: Filed Vitals:   07/09/12 2155 07/10/12 0206 07/10/12 0610 07/10/12 0800  BP: 145/65 140/62 101/59 110/56   Pulse: 72 78 61 62  Temp: 97.5 F (36.4 C) 97.7 F (36.5 C) 97.6 F (36.4 C) 97.9 F (36.6 C)  TempSrc:    Oral  Resp: 18 16 18 16   Height: 5\' 2"  (1.575 m)     Weight: 79.833 kg (176 lb)     SpO2: 97% 96% 96% 97%    Intake/Output Summary (Last 24 hours) at 07/10/12 1118 Last data filed at 07/10/12 1000  Gross per 24 hour  Intake 1593.75 ml  Output    100 ml  Net 1493.75 ml   Filed Weights   07/09/12 2155  Weight: 79.833 kg (176 lb)    Exam:   General:  NAD  Cardiovascular: RRR  Respiratory: CTA biL  Abdomen: soft, mild tenderness, no guarding.   Data Reviewed: Basic Metabolic Panel:  Recent Labs Lab 07/09/12 1508 07/09/12 2016 07/10/12 0500  NA 132* 135 133*  K 4.9 4.1 4.7  CL 97 105 101  CO2 20  --  18*  GLUCOSE 336* 233* 207*  BUN 33* 37* 41*  CREATININE 1.41* 1.70* 1.85*  CALCIUM 9.6  --  8.8   Liver Function Tests:  Recent Labs Lab 07/09/12 1508  AST 32  ALT 17  ALKPHOS 163*  BILITOT 0.3  PROT 7.6  ALBUMIN 3.1*    Recent Labs Lab 07/09/12 1650  LIPASE 47   CBC:  Recent Labs Lab 07/09/12 1508 07/09/12 2016 07/10/12 0500  WBC 10.0  --  7.5  NEUTROABS 7.2  --   --   HGB 12.3 12.6 10.4*  HCT 38.4 37.0 32.5*  MCV 83.7  --  84.4  PLT 338  --  237   BNP (last 3 results)  Recent Labs  06/08/12 2309  PROBNP 234.3  CBG:  Recent Labs Lab 07/09/12 1517 07/09/12 2121 07/10/12 0037 07/10/12 0455 07/10/12 0807  GLUCAP 307* 213* 172* 191* 173*    Studies: Ct Abdomen Pelvis W Contrast  07/09/2012  *RADIOLOGY REPORT*  Clinical Data: 79 year old female with left-sided abdominal and pelvic pain.  CT ABDOMEN AND PELVIS WITH CONTRAST  Technique:  Multidetector CT imaging of the abdomen and pelvis was performed following the standard protocol during bolus administration of intravenous contrast.  Contrast: 80mL OMNIPAQUE IOHEXOL 300 MG/ML  SOLN  Comparison: 03/08/2012 CT  Findings: There is diffuse circumferential wall  thickening of a large segment of mid small bowel, primarily within the left and lower abdomen and pelvis.  A small amount of ascites is noted. There is no evidence of pneumoperitoneum, bowel obstruction or pneumatosis intestinalis. Omental stranding in the left abdomen noted and may be secondary to the enteritis.  The liver, spleen, adrenal glands, pancreas and gallbladder are unremarkable. Bilateral renal cortical atrophy and left renal scarring noted.  There is no evidence of biliary dilatation, enlarged lymph nodes or abdominal aortic aneurysm.  The visualized mesenteric arteries are patent.  Hysterectomy and pessary again noted. A curvilinear density within the sigmoid colon is unchanged. Postoperative changes within the anterior abdominal/pelvic wall again noted.  No acute or suspicious bony abnormalities are noted.  IMPRESSION: Diffuse long segment circumferential small bowel wall thickening and adjacent inflammation with small amount of ascites - likely inflammatory or infectious enteritis. No evidence of bowel obstruction or pneumoperitoneum.  Renal atrophy and left renal scarring.   Original Report Authenticated By: Harmon Pier, M.D.     Scheduled Meds: . sodium chloride   Intravenous STAT  . aspirin  81 mg Oral Daily  . ciprofloxacin  400 mg Intravenous Q12H  . donepezil  10 mg Oral QHS  . enoxaparin (LOVENOX) injection  30 mg Subcutaneous Q24H  .  HYDROmorphone (DILAUDID) injection  1 mg Intravenous Once  . insulin aspart  0-9 Units Subcutaneous Q4H  . labetalol  100 mg Oral BID  . levETIRAcetam  500 mg Oral BID  . metronidazole  500 mg Intravenous Q8H  . ondansetron (ZOFRAN) IV  4 mg Intravenous Once  . pantoprazole  40 mg Oral Daily  . pregabalin  50 mg Oral TID   Continuous Infusions: . sodium chloride 75 mL/hr at 07/10/12 9604    Principal Problem:   Enteritis Active Problems:   DM   Hyperglycemia   Nausea & vomiting   Diarrhea   Abdominal pain, diffuse   ARF (acute renal  failure)   Glenda Gonzalez  [facility] Pager [phone]. If 7 PM - 7 AM, please contact night-coverage at [email or web address], password [facility] 07/10/2012, 11:18 AM  LOS: 1 day

## 2012-07-10 NOTE — Evaluation (Signed)
Physical Therapy Evaluation Patient Details Name: Glenda Gonzalez MRN: [id number] DOB: 05/12/1934 Today's Date: 07/10/2012 Time: 0865-7846 PT Time Calculation (min): 19 min  PT Assessment / Plan / Recommendation Clinical Impression  Pt admitted with N/V/D and reports being very weak and fatigued today. Pt agreeable to limited mobility but did not want to remain OOB after activity. Pt incontinent of stool during session with assist for pericare and pt unaware of BM. Pt very pleasant and reports 24hr assist between spouse and children and spends the day at her dgtr's house while her spouse works. Hopeful for progression acutely to return pt to PLOF with potential need for HHPT if pt unable to achieve goals prior to discharge.     PT Assessment  Patient needs continued PT services    Follow Up Recommendations  Home health PT    Does the patient have the potential to tolerate intense rehabilitation      Barriers to Discharge None      Equipment Recommendations  None recommended by PT    Recommendations for Other Services     Frequency Min 3X/week    Precautions / Restrictions Precautions Precautions: Fall   Pertinent Vitals/Pain 3/10 abdominal cramping pain with request for return to bed      Mobility  Bed Mobility Bed Mobility: Rolling Right;Right Sidelying to Sit;Sitting - Scoot to Edge of Bed;Sit to Supine Rolling Right: 4: Min assist;With rail Right Sidelying to Sit: 4: Min assist;HOB flat;With rails Sitting - Scoot to Edge of Bed: 6: Modified independent (Device/Increase time) Sit to Supine: 5: Supervision;HOB flat Details for Bed Mobility Assistance: cueing for reach for rail to ease transfer and assist to elevate trunk from surface pt able to return to supine without physical assist with cueing to step toward [facility] Transfers Sit to Stand: From bed;4: Min assist Stand to Sit: 4: Min guard;To bed Details for Transfer Assistance: cueing for hand placement and sequence x  2 trials Ambulation/Gait Ambulation/Gait Assistance: 4: Min assist Ambulation Distance (Feet): 4 Feet Assistive device: 1 person hand held assist Ambulation/Gait Assistance Details: pt reported fatigue today and unable to walk further and requested return to bed Gait Pattern: Step-to pattern;Trunk flexed Gait velocity: decreased Stairs Assistance: Not tested (comment) Wheelchair Mobility Wheelchair Mobility: No    Exercises     PT Diagnosis: Difficulty walking  PT Problem List: Decreased strength;Decreased activity tolerance;Decreased mobility PT Treatment Interventions: Gait training;Stair training;Functional mobility training;Therapeutic activities;Therapeutic exercise;Patient/family education   PT Goals Acute Rehab PT Goals PT Goal Formulation: With patient Time For Goal Achievement: 07/24/12 Potential to Achieve Goals: Good Pt will go Supine/Side to Sit: with modified independence;with HOB 0 degrees PT Goal: Supine/Side to Sit - Progress: Goal set today Pt will go Sit to Supine/Side: with modified independence;with HOB 0 degrees PT Goal: Sit to Supine/Side - Progress: Goal set today Pt will go Sit to Stand: with supervision PT Goal: Sit to Stand - Progress: Goal set today Pt will go Stand to Sit: with supervision PT Goal: Stand to Sit - Progress: Goal set today Pt will Ambulate: 51 - 150 feet;with least restrictive assistive device;with supervision PT Goal: Ambulate - Progress: Goal set today Pt will Go Up / Down Stairs: 3-5 stairs;with supervision;with rail(s) PT Goal: Up/Down Stairs - Progress: Goal set today  Visit Information  Last PT Received On: 07/10/12 Assistance Needed: +1    Subjective Data  Subjective: I just can't seem to stay awake Patient Stated Goal: return home   Prior Functioning  Home  Living Lives With: Spouse;Son Available Help at Discharge: Family Type of Home: House Home Access: Ramped entrance;Stairs to enter [employer] of Steps:  5 steps with rail at [employer], ramp at pts house Entrance Stairs-Rails: Right;Left;Can reach both Home Layout: One level Firefighter: Standard Home Adaptive Equipment: Bedside commode/3-in-1;Walker - rolling;Straight cane Additional Comments: spouse works and dgtr picks up pt and takes her to [employer] each day until spouse gets off work and picks up pt.  Prior Function Level of Independence: Needs assistance Needs Assistance: Gait Bath: Moderate Toileting: Minimal Meal Prep: Total Light Housekeeping: Total Gait Assistance: with cane, WC for long distance Able to Take Stairs?: No Driving: No Vocation: Retired Comments: pt walks with cane and cane get OOB and to toilet on her own but requires assist for pericare. Pt sponge bathes and does not get into shower Communication Communication: No difficulties Dominant Hand: Right    Cognition  Cognition Overall Cognitive Status: Appears within functional limits for tasks assessed/performed Arousal/Alertness: Awake/alert Orientation Level: Appears intact for tasks assessed Behavior During Session: [facility] for tasks performed    Extremity/Trunk Assessment Right Lower Extremity Assessment RLE ROM/Strength/Tone: [facility] for tasks assessed;Unable to fully assess (grossly WFL did not fully assess due to pt fatigue) Left Lower Extremity Assessment LLE ROM/Strength/Tone: [facility] for tasks assessed;Unable to fully assess Trunk Assessment Trunk Assessment: Kyphotic   Balance Static Sitting Balance Static Sitting - Balance Support: Feet supported;Right upper extremity supported Static Sitting - Level of Assistance: 5: Stand by assistance Static Standing Balance Static Standing - Balance Support: Left upper extremity supported Static Standing - Level of Assistance: 5: Stand by assistance Static Standing - Comment/# of Minutes: 2 minutes during assist for pericare and linen change  End of Session PT - End of Session Patient left: in bed;with call  bell/phone within reach  GP     Delorse Lek 07/10/2012, 3:03 PM  Delaney Meigs, PT [phone]

## 2012-07-10 NOTE — Progress Notes (Signed)
UR completed 

## 2012-07-11 DIAGNOSIS — D649 Anemia, unspecified: Secondary | ICD-10-CM

## 2012-07-11 LAB — BASIC METABOLIC PANEL
GFR calc Af Amer: 24 mL/min — ABNORMAL LOW (ref >90)
GFR calc non Af Amer: 21 mL/min — ABNORMAL LOW (ref >90)
Glucose, Bld: 125 mg/dL — ABNORMAL HIGH (ref 70–99)
Potassium: 3.7 mEq/L (ref 3.5–5.1)
Sodium: 129 mEq/L — ABNORMAL LOW (ref 135–145)

## 2012-07-11 LAB — CBC
HCT: 27.5 % — ABNORMAL LOW (ref 36.0–46.0)
Hemoglobin: 7.9 g/dL — ABNORMAL LOW (ref 12.0–15.0)
MCH: 27.4 pg (ref 26.0–34.0)
MCHC: 32.7 g/dL (ref 30.0–36.0)
MCV: 83.6 fL (ref 78.0–100.0)
Platelets: 244 10*3/uL (ref 150–400)
RBC: 2.87 MIL/uL — ABNORMAL LOW (ref 3.87–5.11)
RDW: 19 % — ABNORMAL HIGH (ref 11.5–15.5)

## 2012-07-11 LAB — URINE CULTURE

## 2012-07-11 LAB — GLUCOSE, CAPILLARY
Glucose-Capillary: 196 mg/dL — ABNORMAL HIGH (ref 70–99)
Glucose-Capillary: 230 mg/dL — ABNORMAL HIGH (ref 70–99)
Glucose-Capillary: 73 mg/dL (ref 70–99)
Glucose-Capillary: 96 mg/dL (ref 70–99)

## 2012-07-11 LAB — ABO/RH: ABO/RH(D): B POS

## 2012-07-11 MED ORDER — INSULIN ASPART 100 UNIT/ML ~~LOC~~ SOLN
0.0000 [IU] | Freq: Three times a day (TID) | SUBCUTANEOUS | Status: DC
Start: 2012-07-11 — End: 2012-07-15
  Administered 2012-07-11 (×2): 2 [IU] via SUBCUTANEOUS
  Administered 2012-07-12: 3 [IU] via SUBCUTANEOUS
  Administered 2012-07-12: 12:00:00 via SUBCUTANEOUS
  Administered 2012-07-13: 2 [IU] via SUBCUTANEOUS
  Administered 2012-07-13: 1 [IU] via SUBCUTANEOUS
  Administered 2012-07-13: 2 [IU] via SUBCUTANEOUS
  Administered 2012-07-14: 1 [IU] via SUBCUTANEOUS
  Administered 2012-07-14 (×2): 2 [IU] via SUBCUTANEOUS
  Administered 2012-07-15: 1 [IU] via SUBCUTANEOUS

## 2012-07-11 MED ORDER — SODIUM CHLORIDE 0.9 % IV BOLUS (SEPSIS)
500.0000 mL | Freq: Once | INTRAVENOUS | Status: AC
  Administered 2012-07-11: 17:00:00 via INTRAVENOUS

## 2012-07-11 NOTE — Progress Notes (Addendum)
[facility] PROGRESS NOTE  Glenda Gonzalez ZOX:[id number] DOB: 07/20/1933 DOA: 07/09/2012 PCP: Glenda Cashing, MD  Brief narrative: History of diabetes mellitus with peripheral neuropathy, dementia, coronary artery disease. Recently admitted 06/08/2012 with questionable seizures, MRI of the brain showed subcentimeter solitary acute infarct right parietal gray matter junction without associated hemorrhage as was advanced atrophy. 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 Echocardiogram with ejection fraction of 55% and normal systolic function. Carotid Dopplers with right 40-59% ICA stenosis. EEG showed no seizure activity. Neurology consulted patient was loaded with Keppra for seizure. Aspirin was added for CVA prophylaxis. Subcutaneous heparin for DVT prophylaxis. Patient did require intubation for airway protection and was extubated 06/10/2012 without difficulty.   Presenting now with NVD, abdominal pain x 24 hours.   Assessment/Plan: 1. Enteritis/nausea/ vomiting Glenda Gonzalez- on Enteric Precautions. C diff positive, on po Vanc started 2/15, likely needs 14 days total. Improving, abdominal pain improved. Still has diarrhea and needs IVF.    2. ARF due to # 1 Genltle IVFs ordered for rehydration. Hold ACE and ARB therapies   3. UTI - asymptomatic, on Cipro; awaiting cultures.   4. Hyperglycemia/DM2- SSI coverage PRN.  5. Anemia - Hemoglobin 7.9 this morning. May be related to severe infection and IVF hydration, no evidence of bleeding. Will repeat this afternoon.   5. Other- Medications reconciled and placed on DVT prophylaxis with Lovenox.   Code Status: Full Family Communication: none  Disposition Plan: home once diarrhea improves.  Consultants:  none  Procedures:  none  Antibiotics:  Flagyl 2/14 >> 2/15  Cipro 2/14 >>  Vancomycin po 2/15 >>  HPI/Subjective: - sitting in chair, is asking about going home  has no complaints.   Objective: Filed Vitals:   07/10/12 2100 07/11/12 0311 07/11/12 0529 07/11/12 0944  BP: 107/53 105/46 111/51 109/59  Pulse: 64 62 69 82  Temp: 97.8 F (36.6 C) 98.1 F (36.7 C) 98.4 F (36.9 C) 98 F (36.7 C)  TempSrc:  Oral Oral Oral  Resp: 17 18 18 16   Height:      Weight:      SpO2: 98% 98% 98% 95%    Intake/Output Summary (Last 24 hours) at 07/11/12 1014 Last data filed at 07/11/12 0529  Gross per 24 hour  Intake 1616.25 ml  Output      0 ml  Net 1616.25 ml   Filed Weights   07/09/12 2155  Weight: 79.833 kg (176 lb)    Exam:   General:  NAD, sitting in chair  Cardiovascular: RRR  Respiratory: CTA biL  Abdomen: soft, mild tenderness right lower quadrant, no guarding, no rebound.  Data Reviewed: Basic Metabolic Panel:  Recent Labs Lab 07/09/12 1508 07/09/12 2016 07/10/12 0500  NA 132* 135 133*  K 4.9 4.1 4.7  CL 97 105 101  CO2 20  --  18*  GLUCOSE 336* 233* 207*  BUN 33* 37* 41*  CREATININE 1.41* 1.70* 1.85*  CALCIUM 9.6  --  8.8   Liver Function Tests:  Recent Labs Lab 07/09/12 1508  AST 32  ALT 17  ALKPHOS 163*  BILITOT 0.3  PROT 7.6  ALBUMIN 3.1*    Recent Labs Lab 07/09/12 1650  LIPASE 47   CBC:  Recent Labs Lab 07/09/12 1508 07/09/12 2016 07/10/12 0500 07/11/12 0915  WBC 10.0  --  7.5 4.9  NEUTROABS 7.2  --   --   --   HGB 12.3 12.6 10.4*  7.9*  HCT 38.4 37.0 32.5* 24.0*  MCV 83.7  --  84.4 83.6  PLT 338  --  237 226   BNP (last 3 results)  Recent Labs  06/08/12 2309  PROBNP 234.3   CBG:  Recent Labs Lab 07/10/12 1744 07/10/12 2009 07/11/12 0006 07/11/12 0421 07/11/12 0748  GLUCAP 123* 127* 96 121* 73    Studies: Ct Abdomen Pelvis W Contrast  07/09/2012  *RADIOLOGY REPORT*  Clinical Data: 79 year old female with left-sided abdominal and pelvic pain.  CT ABDOMEN AND PELVIS WITH CONTRAST  Technique:  Multidetector CT imaging of the abdomen and pelvis was performed following the  standard protocol during bolus administration of intravenous contrast.  Contrast: 80mL OMNIPAQUE IOHEXOL 300 MG/ML  SOLN  Comparison: 03/08/2012 CT  Findings: There is diffuse circumferential wall thickening of a large segment of mid small bowel, primarily within the left and lower abdomen and pelvis.  A small amount of ascites is noted. There is no evidence of pneumoperitoneum, bowel obstruction or pneumatosis intestinalis. Omental stranding in the left abdomen noted and may be secondary to the enteritis.  The liver, spleen, adrenal glands, pancreas and gallbladder are unremarkable. Bilateral renal cortical atrophy and left renal scarring noted.  There is no evidence of biliary dilatation, enlarged lymph nodes or abdominal aortic aneurysm.  The visualized mesenteric arteries are patent.  Hysterectomy and pessary again noted. A curvilinear density within the sigmoid colon is unchanged. Postoperative changes within the anterior abdominal/pelvic wall again noted.  No acute or suspicious bony abnormalities are noted.  IMPRESSION: Diffuse long segment circumferential small bowel wall thickening and adjacent inflammation with small amount of ascites - likely inflammatory or infectious enteritis. No evidence of bowel obstruction or pneumoperitoneum.  Renal atrophy and left renal scarring.   Original Report Authenticated By: Harmon Pier, M.D.     Scheduled Meds: . aspirin  81 mg Oral Daily  . ciprofloxacin  400 mg Intravenous Q12H  . donepezil  10 mg Oral QHS  . enoxaparin (LOVENOX) injection  30 mg Subcutaneous Q24H  .  HYDROmorphone (DILAUDID) injection  1 mg Intravenous Once  . insulin aspart  0-9 Units Subcutaneous TID WC  . labetalol  100 mg Oral BID  . levETIRAcetam  500 mg Oral BID  . ondansetron (ZOFRAN) IV  4 mg Intravenous Once  . pantoprazole  40 mg Oral Daily  . pregabalin  50 mg Oral TID  . vancomycin  125 mg Oral Q6H   Continuous Infusions: . sodium chloride 75 mL/hr at 07/11/12 0815     Principal Problem:   Enteritis Active Problems:   DM   Hyperglycemia   Nausea & vomiting   Diarrhea   Abdominal pain, diffuse   ARF (acute renal failure)   Glenda Gonzalez  [facility] Pager [phone]. If 7 PM - 7 AM, please contact night-coverage at [email or web address], password [facility] 07/11/2012, 10:14 AM  LOS: 2 days

## 2012-07-12 LAB — CBC
HCT: 25.9 % — ABNORMAL LOW (ref 36.0–46.0)
Hemoglobin: 8.4 g/dL — ABNORMAL LOW (ref 12.0–15.0)
MCH: 27.2 pg (ref 26.0–34.0)
MCHC: 32.4 g/dL (ref 30.0–36.0)
MCV: 83.8 fL (ref 78.0–100.0)
Platelets: 243 K/uL (ref 150–400)
RBC: 3.09 MIL/uL — ABNORMAL LOW (ref 3.87–5.11)
RDW: 19 % — ABNORMAL HIGH (ref 11.5–15.5)
WBC: 5.3 K/uL (ref 4.0–10.5)

## 2012-07-12 LAB — BASIC METABOLIC PANEL
Chloride: 105 mEq/L (ref 96–112)
Creatinine, Ser: 2.1 mg/dL — ABNORMAL HIGH (ref 0.50–1.10)
GFR calc Af Amer: 25 mL/min — ABNORMAL LOW (ref >90)
Potassium: 3.8 mEq/L (ref 3.5–5.1)
Sodium: 137 mEq/L (ref 135–145)

## 2012-07-12 LAB — GLUCOSE, CAPILLARY
Glucose-Capillary: 85 mg/dL (ref 70–99)
Glucose-Capillary: 216 mg/dL — ABNORMAL HIGH (ref 70–99)

## 2012-07-12 MED ORDER — LACTATED RINGERS IV BOLUS (SEPSIS)
250.0000 mL | Freq: Once | INTRAVENOUS | Status: AC
  Administered 2012-07-12: 250 mL via INTRAVENOUS

## 2012-07-12 MED ORDER — CIPROFLOXACIN HCL 500 MG PO TABS
500.0000 mg | ORAL_TABLET | ORAL | Status: DC
  Administered 2012-07-12: 500 mg via ORAL
  Filled 2012-07-12 (×2): qty 1

## 2012-07-12 MED ORDER — LACTATED RINGERS IV SOLN
INTRAVENOUS | Status: DC
  Administered 2012-07-12 – 2012-07-15 (×5): via INTRAVENOUS

## 2012-07-12 NOTE — ED Provider Notes (Signed)
Medical screening examination/treatment/procedure(s) were performed by non-physician practitioner and as supervising physician I was immediately available for consultation/collaboration.   Carleene Cooper III, MD 07/12/12 [phone]

## 2012-07-12 NOTE — Progress Notes (Signed)
Physical Therapy Treatment Patient Details Name: Glenda Gonzalez MRN: [id number] DOB: 01/24/1934 Today's Date: 07/12/2012 Time: 4098-1191 PT Time Calculation (min): 28 min  PT Assessment / Plan / Recommendation Comments on Treatment Session   Pt tends to very guarded due to fear of not being able to complete tasks which limits her mobility. Pt a agreeable to transfer to chair but declined ambulation stating that she could not do it. Will continue to progress as tolerable and motivate pt to participate.    Follow Up Recommendations  Home health PT                 Equipment Recommendations  None recommended by PT    Recommendations for Other Services    Frequency Min 3X/week   Plan Discharge plan remains appropriate;Frequency remains appropriate    Precautions / Restrictions Precautions Precautions: Fall Restrictions Weight Bearing Restrictions: No   Pertinent Vitals/Pain no apparent distress     Mobility  Bed Mobility Rolling Right: 4: Min assist;With rail Right Sidelying to Sit: 4: Min assist;With rails Details for Bed Mobility Assistance: cueing for hand placement to push up from bed and reach back for chair to sit Transfers Sit to Stand: 3: Mod assist;With upper extremity assist;From bed Stand to Sit: 4: Min assist;To chair/3-in-1 Stand Pivot Transfers: 3: Mod assist Details for Transfer Assistance: Pt required to attempts to stand. Mild c/o of dizziness subsiding with return to seated position. Mod A to obtain upright position and maintain COM over BOS. Face to face positioning for standing and pivot transfer. Ambulation/Gait Assistive device:  (face to face postioning)    Exercises Total Joint Exercises Ankle Circles/Pumps: AROM;15 reps      PT Goals Acute Rehab PT Goals PT Goal: Supine/Side to Sit - Progress: Progressing toward goal PT Goal: Sit to Stand - Progress: Progressing toward goal PT Goal: Stand to Sit - Progress: Progressing toward goal PT  Goal: Ambulate - Progress: Not progressing  Visit Information  Last PT Received On: 07/12/12 Assistance Needed: +1    Subjective Data      Cognition  Cognition Overall Cognitive Status: Appears within functional limits for tasks assessed/performed Arousal/Alertness: Awake/alert Orientation Level: Appears intact for tasks assessed Behavior During Session: [facility] for tasks performed    Balance  Static Sitting Balance Static Sitting - Balance Support: Bilateral upper extremity supported;Feet supported Static Sitting - Level of Assistance: 5: Stand by assistance (x10 min. Cueing to correct posture)  End of Session PT - End of Session Equipment Utilized During Treatment: Gait belt Activity Tolerance: Patient tolerated treatment well Patient left: in chair;with call bell/phone within reach   GP     [name] 07/12/2012, 3:46 PM

## 2012-07-12 NOTE — Progress Notes (Signed)
Julieth Tugman, PTA [phone] 07/12/2012  

## 2012-07-12 NOTE — Progress Notes (Signed)
[facility] PROGRESS NOTE  Glenda Gonzalez ZOX:[id number] DOB: 1933-10-15 DOA: 07/09/2012 PCP: Billee Cashing, MD  Brief narrative: History of diabetes mellitus with peripheral neuropathy, dementia, coronary artery disease. Recently admitted 06/08/2012 with questionable seizures, MRI of the brain showed subcentimeter solitary acute infarct right parietal gray matter junction without associated hemorrhage as was advanced atrophy. 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 Echocardiogram with ejection fraction of 55% and normal systolic function. Carotid Dopplers with right 40-59% ICA stenosis. EEG showed no seizure activity. Neurology consulted patient was loaded with Keppra for seizure. Aspirin was added for CVA prophylaxis. Subcutaneous heparin for DVT prophylaxis. Patient did require intubation for airway protection and was extubated 06/10/2012 without difficulty.   Presenting now with NVD, abdominal pain x 24 hours.   Assessment/Plan: 1. Enteritis/nausea/ vomiting [name]- on Enteric Precautions. C diff positive, on po Vanc started 2/15, likely needs 14 days total. Improving, abdominal pain improved. Still has diarrhea and needs IVF.  - will switch IVF to ringer today given low bicarb   2. ARF due to # 1 Genltle IVFs ordered for rehydration. Hold ACE and ARB therapies  - renal function peaked yesterday, now slowly improving. Will continue to monitor and support with IVF  3. UTI - asymptomatic, on Cipro; cultures not helpful, will treat for 5 days.    4. Hyperglycemia/DM2- SSI coverage PRN.  5. Anemia - Hemoglobin 7.9 yesterday. May be related to severe infection and IVF hydration, no evidence of bleeding.  - repeat Hb stable  5. Other- Medications reconciled and placed on DVT prophylaxis with Lovenox.   Code Status: Full Family Communication: talked to husband over the phone in the room Disposition Plan: home once  diarrhea improves.  Consultants:  none  Procedures:  none  Antibiotics:  Flagyl 2/14 >> 2/15  Cipro 2/14 >>  Vancomycin po 2/15 >>  HPI/Subjective: -no complaints, would like to go home.   Objective: Filed Vitals:   07/11/12 0944 07/11/12 1453 07/11/12 2054 07/12/12 0614  BP: 109/59 112/50 117/62 134/58  Pulse: 82 65 65 62  Temp: 98 F (36.7 C) 98.1 F (36.7 C) 98.5 F (36.9 C) 98 F (36.7 C)  TempSrc: Oral Oral Oral Oral  Resp: 16 17 18 18   Height:      Weight:      SpO2: 95% 96% 91% 93%    Intake/Output Summary (Last 24 hours) at 07/12/12 1051 Last data filed at 07/12/12 0618  Gross per 24 hour  Intake 1686.25 ml  Output   1150 ml  Net 536.25 ml   Filed Weights   07/09/12 2155  Weight: 79.833 kg (176 lb)    Exam:   General:  NAD, eating breakfast inbed  Cardiovascular: RRR  Respiratory: CTA biL  Abdomen: soft, mild tenderness right lower quadrant, no guarding, no rebound.  Data Reviewed: Basic Metabolic Panel:  Recent Labs Lab 07/09/12 1508 07/09/12 2016 07/10/12 0500 07/11/12 0915 07/12/12 0705  NA 132* 135 133* 129* 137  K 4.9 4.1 4.7 3.7 3.8  CL 97 105 101 99 105  CO2 20  --  18* 17* 17*  GLUCOSE 336* 233* 207* 125* 83  BUN 33* 37* 41* 43* 44*  CREATININE 1.41* 1.70* 1.85* 2.18* 2.10*  CALCIUM 9.6  --  8.8 8.0* 8.1*   Liver Function Tests:  Recent Labs Lab 07/09/12 1508  AST 32  ALT 17  ALKPHOS 163*  BILITOT 0.3  PROT 7.6  ALBUMIN 3.1*  Recent Labs Lab 07/09/12 1650  LIPASE 47   CBC:  Recent Labs Lab 07/09/12 1508 07/09/12 2016 07/10/12 0500 07/11/12 0915 07/11/12 1410 07/12/12 0705  WBC 10.0  --  7.5 4.9 6.0 5.3  NEUTROABS 7.2  --   --   --   --   --   HGB 12.3 12.6 10.4* 7.9* 9.0* 8.4*  HCT 38.4 37.0 32.5* 24.0* 27.5* 25.9*  MCV 83.7  --  84.4 83.6 83.6 83.8  PLT 338  --  237 226 244 243   BNP (last 3 results)  Recent Labs  06/08/12 2309  PROBNP 234.3   CBG:  Recent Labs Lab  07/11/12 0748 07/11/12 1158 07/11/12 1709 07/11/12 2103 07/12/12 0802  GLUCAP 73 179* 196* 230* 85    Studies: No results found.  Scheduled Meds: . aspirin  81 mg Oral Daily  . ciprofloxacin  500 mg Oral Q24H  . donepezil  10 mg Oral QHS  . enoxaparin (LOVENOX) injection  30 mg Subcutaneous Q24H  .  HYDROmorphone (DILAUDID) injection  1 mg Intravenous Once  . insulin aspart  0-9 Units Subcutaneous TID WC  . labetalol  100 mg Oral BID  . lactated ringers  250 mL Intravenous Once  . levETIRAcetam  500 mg Oral BID  . ondansetron (ZOFRAN) IV  4 mg Intravenous Once  . pantoprazole  40 mg Oral Daily  . pregabalin  50 mg Oral TID  . vancomycin  125 mg Oral Q6H   Continuous Infusions: . lactated ringers 75 mL/hr at 07/12/12 1002    Principal Problem:   Enteritis Active Problems:   DM   Hyperglycemia   Nausea & vomiting   Diarrhea   Abdominal pain, diffuse   ARF (acute renal failure)   Glenda Gonzalez  [facility] Pager [phone]. If 7 PM - 7 AM, please contact night-coverage at [email or web address], password [facility] 07/12/2012, 10:51 AM  LOS: 3 days

## 2012-07-13 ENCOUNTER — Inpatient Hospital Stay (HOSPITAL_COMMUNITY): Payer: Medicare Other

## 2012-07-13 LAB — CBC
HCT: 27.6 % — ABNORMAL LOW (ref 36.0–46.0)
MCHC: 31.9 g/dL (ref 30.0–36.0)
MCV: 84.4 fL (ref 78.0–100.0)
RDW: 19.2 % — ABNORMAL HIGH (ref 11.5–15.5)

## 2012-07-13 LAB — BASIC METABOLIC PANEL
BUN: 41 mg/dL — ABNORMAL HIGH (ref 6–23)
Creatinine, Ser: 1.87 mg/dL — ABNORMAL HIGH (ref 0.50–1.10)
GFR calc Af Amer: 29 mL/min — ABNORMAL LOW (ref >90)
GFR calc non Af Amer: 25 mL/min — ABNORMAL LOW (ref >90)
Potassium: 4.2 mEq/L (ref 3.5–5.1)

## 2012-07-13 LAB — GLUCOSE, CAPILLARY
Glucose-Capillary: 162 mg/dL — ABNORMAL HIGH (ref 70–99)
Glucose-Capillary: 205 mg/dL — ABNORMAL HIGH (ref 70–99)

## 2012-07-13 MED ORDER — VANCOMYCIN 50 MG/ML ORAL SOLUTION: 125.0000 mg | Freq: Four times a day (QID) | ORAL | Status: AC

## 2012-07-13 MED ORDER — BIMATOPROST 0.01 % OP SOLN
1.0000 [drp] | Freq: Two times a day (BID) | OPHTHALMIC | Status: DC
  Administered 2012-07-13 – 2012-07-14 (×3): 1 [drp] via OPHTHALMIC
  Filled 2012-07-13: qty 2.5

## 2012-07-13 NOTE — Progress Notes (Signed)
[facility]  Patient Status: Active (receiving services up to time of hospitalization)  [facility] is providing the following services: RN, PT and OT  If patient discharges after hours, please call [phone].   Jodene Nam 07/13/2012, 2:54 PM

## 2012-07-13 NOTE — Research (Signed)
Mrs. Glenda Gonzalez qualifies for the C.diff research study where she will be provided free oral vancomycin at discharge for the remainder of her Cdiff therapy. Physician approval has been verbally obtained by Dr. [name]. Patient's written informed consent has been obtained and original copy of consent is placed in the shadow chart. No procedures or study medication has been provided prior to informed consent.   Please provide a prescription for oral vancomycin to Main Pharm[facility] prior to patient discharge   Thank you, Franchot Erichsen, Pharm.D. Clinical Pharmacist   Pager: [phone] 07/13/2012 9:17 AM

## 2012-07-13 NOTE — Progress Notes (Signed)
Physical Therapy Treatment Patient Details Name: Glenda Gonzalez MRN: [id number] DOB: November 30, 1933 Today's Date: 07/13/2012 Time: 4098-1191 PT Time Calculation (min): 18 min  PT Assessment / Plan / Recommendation Comments on Treatment Session  Pt not progressing with activity, despite encouragement. Pt unable to reach full standing position, and patient declining ambulation attempts. Will continue to see and attempt to progress activity as tolerated.    Follow Up Recommendations  Home health PT           Equipment Recommendations  None recommended by PT       Frequency Min 3X/week   Plan Discharge plan remains appropriate;Frequency remains appropriate    Precautions / Restrictions Precautions Precautions: Fall Restrictions Weight Bearing Restrictions: No       Mobility  Bed Mobility Bed Mobility: Rolling Right;Right Sidelying to Sit;Sit to Supine Rolling Right: 5: Supervision Rolling Left: 5: Supervision Sit to Supine: 5: Supervision;HOB flat Details for Bed Mobility Assistance: cueing for hand placement to push up from bed and reach back for chair to sit Transfers Transfers: Sit to Stand;Stand to Sit Sit to Stand: 3: Mod assist;With upper extremity assist;From chair/3-in-1 Stand to Sit: 4: Min assist;To bed Stand Pivot Transfers: 3: Mod assist Details for Transfer Assistance: Pt unable to stand or ambulate; 3 unsuccessful trials; patient fatigued unable to maintain standing Ambulation/Gait Ambulation/Gait Assistance: Not tested (comment)    Exercises Total Joint Exercises Ankle Circles/Pumps: AROM;15 reps    PT Goals Acute Rehab PT Goals PT Goal Formulation: With patient Time For Goal Achievement: 07/24/12 Potential to Achieve Goals: Good Pt will go Supine/Side to Sit: with modified independence;with HOB 0 degrees Pt will go Sit to Supine/Side: with modified independence;with HOB 0 degrees PT Goal: Sit to Supine/Side - Progress: Progressing toward goal Pt  will go Sit to Stand: with supervision PT Goal: Sit to Stand - Progress: Progressing toward goal Pt will go Stand to Sit: with supervision PT Goal: Stand to Sit - Progress: Progressing toward goal Pt will Ambulate: 51 - 150 feet;with least restrictive assistive device;with supervision PT Goal: Ambulate - Progress: Not progressing Pt will Go Up / Down Stairs: 3-5 stairs;with supervision;with rail(s)  Visit Information  Last PT Received On: 07/13/12 Assistance Needed: +1    Subjective Data  Subjective: I am really tired and I need to go to the bathroom Patient Stated Goal: return home   Cognition  Cognition Overall Cognitive Status: Appears within functional limits for tasks assessed/performed Arousal/Alertness: Lethargic Orientation Level: Appears intact for tasks assessed Behavior During Session: [facility] for tasks performed       End of Session PT - End of Session Equipment Utilized During Treatment: Gait belt Activity Tolerance: Patient limited by fatigue Patient left: in bed;with call bell/phone within reach   GP     Fabio Asa 07/13/2012, 4:02 PM Charlotte Crumb, PT DPT  [phone]

## 2012-07-13 NOTE — Progress Notes (Signed)
[facility] PROGRESS NOTE  Glenda Gonzalez:[id number] DOB: March 06, 1934 DOA: 07/09/2012 PCP: Billee Cashing, MD  Brief narrative: History of diabetes mellitus with peripheral neuropathy, dementia, coronary artery disease. Recently admitted 06/08/2012 with questionable seizures, MRI of the brain showed subcentimeter solitary acute infarct right parietal gray matter junction without associated hemorrhage as was advanced atrophy. 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 Echocardiogram with ejection fraction of 55% and normal systolic function. Carotid Dopplers with right 40-59% ICA stenosis. EEG showed no seizure activity. Neurology consulted patient was loaded with Keppra for seizure. Aspirin was added for CVA prophylaxis. Subcutaneous heparin for DVT prophylaxis. Patient did require intubation for airway protection and was extubated 06/10/2012 without difficulty.   Presenting now with NVD, abdominal pain x 24 hours.   Assessment/Plan: 1. Enteritis/nausea/ vomiting [name]- on Enteric Precautions. C diff positive, on po Vanc started 2/15, likely needs 14 days total. - will switch IVF to ringer today given low bicarb - initially abdominal pain improved and was planning discharge on 2/18 however her abdomen became more tender today. Had one formed BM which is reassuring but will continue to monitor her abdominal pain for 24 hours then consider discharge. - abdominal XR today.    2. ARF due to # 1 Genltle IVFs ordered for rehydration. Hold ACE and ARB therapies  - improving  3. UTI - asymptomatic - s/p treatment with Ciprofloxacin  4. Hyperglycemia/DM2- SSI coverage PRN.  5. Anemia - stable  5. Other- Medications reconciled and placed on DVT prophylaxis with Lovenox.   Code Status: Full Family Communication: none Disposition Plan: home once diarrhea improves and abdominal pain  improves.  Consultants:  none  Procedures:  none  Antibiotics:  Flagyl 2/14 >> 2/15  Cipro 2/14 >>  Vancomycin po 2/15 >>  HPI/Subjective: - complains of increased abdominal tenderness.   Objective: Filed Vitals:   07/12/12 0614 07/12/12 1300 07/12/12 2143 07/13/12 0456  BP: 134/58 138/70 140/67 143/65  Pulse: 62 88 62 56  Temp: 98 F (36.7 C) 98.4 F (36.9 C) 98.4 F (36.9 C) 97.7 F (36.5 C)  TempSrc: Oral  Oral Oral  Resp: 18 18 18 18   Height:      Weight:      SpO2: 93% 95% 94% 96%    Intake/Output Summary (Last 24 hours) at 07/13/12 1104 Last data filed at 07/13/12 0552  Gross per 24 hour  Intake 1487.5 ml  Output      0 ml  Net 1487.5 ml   Filed Weights   07/09/12 2155  Weight: 79.833 kg (176 lb)    Exam:   General:  NAD,   Cardiovascular: RRR  Respiratory: CTA biL  Abdomen: soft, increased tenderness throughout, no guarding or rebound.   Data Reviewed: Basic Metabolic Panel:  Recent Labs Lab 07/09/12 1508 07/09/12 2016 07/10/12 0500 07/11/12 0915 07/12/12 0705 07/13/12 0430  NA 132* 135 133* 129* 137 137  K 4.9 4.1 4.7 3.7 3.8 4.2  CL 97 105 101 99 105 106  CO2 20  --  18* 17* 17* 18*  GLUCOSE 336* 233* 207* 125* 83 186*  BUN 33* 37* 41* 43* 44* 41*  CREATININE 1.41* 1.70* 1.85* 2.18* 2.10* 1.87*  CALCIUM 9.6  --  8.8 8.0* 8.1* 8.5   Liver Function Tests:  Recent Labs Lab 07/09/12 1508  AST 32  ALT 17  ALKPHOS 163*  BILITOT 0.3  PROT 7.6  ALBUMIN 3.1*  Recent Labs Lab 07/09/12 1650  LIPASE 47   CBC:  Recent Labs Lab 07/09/12 1508  07/10/12 0500 07/11/12 0915 07/11/12 1410 07/12/12 0705 07/13/12 0430  WBC 10.0  --  7.5 4.9 6.0 5.3 6.5  NEUTROABS 7.2  --   --   --   --   --   --   HGB 12.3  < > 10.4* 7.9* 9.0* 8.4* 8.8*  HCT 38.4  < > 32.5* 24.0* 27.5* 25.9* 27.6*  MCV 83.7  --  84.4 83.6 83.6 83.8 84.4  PLT 338  --  237 226 244 243 259  < > = values in this interval not displayed. BNP (last 3  results)  Recent Labs  06/08/12 2309  PROBNP 234.3   CBG:  Recent Labs Lab 07/12/12 0802 07/12/12 1213 07/12/12 1705 07/12/12 2145 07/13/12 0727  GLUCAP 85 236* 247* 216* 147*    Studies: No results found.  Scheduled Meds: . aspirin  81 mg Oral Daily  . donepezil  10 mg Oral QHS  . enoxaparin (LOVENOX) injection  30 mg Subcutaneous Q24H  .  HYDROmorphone (DILAUDID) injection  1 mg Intravenous Once  . insulin aspart  0-9 Units Subcutaneous TID WC  . labetalol  100 mg Oral BID  . levETIRAcetam  500 mg Oral BID  . ondansetron (ZOFRAN) IV  4 mg Intravenous Once  . pantoprazole  40 mg Oral Daily  . pregabalin  50 mg Oral TID  . vancomycin  125 mg Oral Q6H   Continuous Infusions: . lactated ringers 75 mL/hr at 07/12/12 2150    Principal Problem:   Enteritis Active Problems:   DM   Hyperglycemia   Nausea & vomiting   Diarrhea   Abdominal pain, diffuse   ARF (acute renal failure)   [name]  [facility] Pager [phone]. If 7 PM - 7 AM, please contact night-coverage at [email or web address], password [facility] 07/13/2012, 11:04 AM  LOS: 4 days

## 2012-07-14 DIAGNOSIS — K5289 Other specified noninfective gastroenteritis and colitis: Secondary | ICD-10-CM

## 2012-07-14 LAB — GLUCOSE, CAPILLARY
Glucose-Capillary: 143 mg/dL — ABNORMAL HIGH (ref 70–99)
Glucose-Capillary: 168 mg/dL — ABNORMAL HIGH (ref 70–99)
Glucose-Capillary: 190 mg/dL — ABNORMAL HIGH (ref 70–99)

## 2012-07-14 LAB — BASIC METABOLIC PANEL
CO2: 19 mEq/L (ref 19–32)
Chloride: 104 mEq/L (ref 96–112)
GFR calc Af Amer: 31 mL/min — ABNORMAL LOW (ref >90)
Potassium: 4.4 mEq/L (ref 3.5–5.1)
Sodium: 134 mEq/L — ABNORMAL LOW (ref 135–145)

## 2012-07-14 LAB — STOOL CULTURE

## 2012-07-14 LAB — CBC
Platelets: 240 10*3/uL (ref 150–400)
RBC: 3.26 MIL/uL — ABNORMAL LOW (ref 3.87–5.11)
WBC: 6.4 10*3/uL (ref 4.0–10.5)

## 2012-07-14 NOTE — Progress Notes (Signed)
Physical Therapy Treatment Patient Details Name: Glenda Gonzalez MRN: [id number] DOB: [birth year] Today's Date: 07/14/2012 Time: 4098-1191 PT Time Calculation (min): 23 min  PT Assessment / Plan / Recommendation Comments on Treatment Session  Pt adm with c-diff.  Able to walk short distance today.  More comfortable with 2 people in the room to assist due to fear of falling. At this time pt's family will have to assist her with mobility at all times.    Follow Up Recommendations  Home health PT;Supervision/Assistance - 24 hour     Does the patient have the potential to tolerate intense rehabilitation     Barriers to Discharge        Equipment Recommendations  None recommended by PT    Recommendations for Other Services    Frequency Min 3X/week   Plan Discharge plan remains appropriate;Frequency remains appropriate    Precautions / Restrictions Precautions Precautions: Fall   Pertinent Vitals/Pain No c/o's    Mobility  Bed Mobility Rolling Right: 3: Mod assist;5: Supervision (Mod A initially for bedpan and then supervision next time) Right Sidelying to Sit: 3: Mod assist;With rails;HOB elevated Sitting - Scoot to Edge of Bed: 4: Min assist Details for Bed Mobility Assistance: verbal cues for technique.  Initially slow to move but improved with more mobility Transfers Sit to Stand: 3: Mod assist;With upper extremity assist;From bed Stand to Sit: 4: Min assist;With upper extremity assist;With armrests;To chair/3-in-1 Details for Transfer Assistance: Verbal cues for hand placement. Ambulation/Gait Ambulation/Gait Assistance: 4: Min assist (+1 to follow with chair to encourage incr amb) Ambulation Distance (Feet): 8 Feet Assistive device: Rolling walker Ambulation/Gait Assistance Details: verbal cues to stand more erect and incr step length Gait Pattern: Step-to pattern;Decreased step length - right;Decreased step length - left;Shuffle;Trunk flexed Gait velocity: decr     Exercises     PT Diagnosis:    PT Problem List:   PT Treatment Interventions:     PT Goals Acute Rehab PT Goals PT Goal: Sit to Stand - Progress: Progressing toward goal PT Goal: Stand to Sit - Progress: Progressing toward goal PT Goal: Ambulate - Progress: Progressing toward goal  Visit Information  Last PT Received On: 07/14/12 Assistance Needed: +2 (pt more willing to amb)    Subjective Data  Subjective: "I'll try," pt stated about walking.   Cognition  Cognition Overall Cognitive Status: Appears within functional limits for tasks assessed/performed Arousal/Alertness: Awake/alert Orientation Level: Appears intact for tasks assessed Behavior During Session: [facility] for tasks performed    Balance  Static Standing Balance Static Standing - Balance Support: Bilateral upper extremity supported (on walker) Static Standing - Level of Assistance: 4: Min assist  End of Session PT - End of Session Equipment Utilized During Treatment: Gait belt Activity Tolerance: Patient limited by fatigue Patient left: in chair;with call bell/phone within reach Nurse Communication: Mobility status   GP     Zarif Rathje 07/14/2012, 12:50 PM  [employer] PT [phone]

## 2012-07-14 NOTE — Progress Notes (Signed)
[facility] PROGRESS NOTE  Glenda Gonzalez WGN:[id number] DOB: Oct 20, 1933 DOA: 07/09/2012 PCP: Billee Cashing, MD  Brief Narrative: History of diabetes mellitus with peripheral neuropathy, dementia, coronary artery disease. Recently admitted 06/08/2012 with questionable seizures, MRI of the brain showed subcentimeter solitary acute infarct right parietal gray matter junction without associated hemorrhage as was advanced atrophy. 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 Echocardiogram with ejection fraction of 55% and normal systolic function. Carotid Dopplers with right 40-59% ICA stenosis. EEG showed no seizure activity. Neurology consulted patient was loaded with Keppra for seizure. Aspirin was added for CVA prophylaxis. Subcutaneous heparin for DVT prophylaxis. Patient did require intubation for airway protection and was extubated 06/10/2012 without difficulty. Presenting now with NVD, abdominal pain x 24 hours.  Assessment/Plan: C. Diff colitis/nausea/ vomiting /diarrhea On enteric precautions. C diff positive, on po Vanc started 2/15, likely needs 14 days total. Initially abdominal pain improved, however on 07/13/2012 patient was complaining of worsening abdominal pain. Abdominal x-ray on 07/13/2012 was negative. Continue to monitor.   Acute renal failure due to C. Diff colitis. Continue IVF, improved with IVF. Hold ACE and ARB therapies.  UTI  Asymptomatic. S/p treatment with Ciprofloxacin   Hyperglycemia/DM2 SSI coverage. Hemoglobin A1C on 07/10/2012 was 10.8. Further management as per PCP after discharge.  Anemia  Stable   Seizure disorder Continue Keppra.  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 PT recommending home health PT at discharge.  Code Status: Full code Family Communication: No family at bedside. Disposition Plan: Pending improvement in  diarrhea.   Consultants:  None  Procedures:  As above.  Antibiotics:  Oral vancomycin.  HPI/Subjective: Complaining of abdominal pain.   Objective: Filed Vitals:   07/13/12 0900 07/13/12 1358 07/13/12 2118 07/14/12 0500  BP: 156/57 129/65 147/63 150/69  Pulse: 64 58 82 58  Temp: 97.5 F (36.4 C) 98.1 F (36.7 C) 98.3 F (36.8 C) 98.1 F (36.7 C)  TempSrc: Axillary     Resp:  20 18 18   Height:      Weight:      SpO2: 96% 98% 100% 95%    Intake/Output Summary (Last 24 hours) at 07/14/12 1011 Last data filed at 07/14/12 0930  Gross per 24 hour  Intake   2379 ml  Output    200 ml  Net   2179 ml   Filed Weights   07/09/12 2155  Weight: 79.833 kg (176 lb)    Exam: Physical Exam: General: Awake, Oriented, No acute distress. HEENT: EOMI. Neck: Supple CV: S1 and S2 Lungs: Clear to ascultation bilaterally Abdomen: Soft, tender over the umbilicus, Nondistended, +bowel sounds. Ext: Good pulses. Trace edema.  Data Reviewed: Basic Metabolic Panel:  Recent Labs Lab 07/10/12 0500 07/11/12 0915 07/12/12 0705 07/13/12 0430 07/14/12 0440  NA 133* 129* 137 137 134*  K 4.7 3.7 3.8 4.2 4.4  CL 101 99 105 106 104  CO2 18* 17* 17* 18* 19  GLUCOSE 207* 125* 83 186* 153*  BUN 41* 43* 44* 41* 36*  CREATININE 1.85* 2.18* 2.10* 1.87* 1.76*  CALCIUM 8.8 8.0* 8.1* 8.5 8.6   Liver Function Tests:  Recent Labs Lab 07/09/12 1508  AST 32  ALT 17  ALKPHOS 163*  BILITOT 0.3  PROT 7.6  ALBUMIN 3.1*    Recent Labs Lab 07/09/12 1650  LIPASE 47   No results found for this basename: AMMONIA,  in the last 168 hours CBC:  Recent Labs Lab 07/09/12 1508  07/11/12 0915 07/11/12 1410 07/12/12 0705 07/13/12 0430 07/14/12 0440  WBC 10.0  < > 4.9 6.0 5.3 6.5 6.4  NEUTROABS 7.2  --   --   --   --   --   --   HGB 12.3  < > 7.9* 9.0* 8.4* 8.8* 8.6*  HCT 38.4  < > 24.0* 27.5* 25.9* 27.6* 27.4*  MCV 83.7  < > 83.6 83.6 83.8 84.4 84.0  PLT 338  < > 226 244 243 259  240  < > = values in this interval not displayed. Cardiac Enzymes: No results found for this basename: CKTOTAL, CKMB, CKMBINDEX, TROPONINI,  in the last 168 hours BNP (last 3 results)  Recent Labs  06/08/12 2309  PROBNP 234.3   CBG:  Recent Labs Lab 07/12/12 2145 07/13/12 0727 07/13/12 1218 07/13/12 1727 07/13/12 2109  GLUCAP 216* 147* 162* 205* 189*    Recent Results (from the past 240 hour(s))  URINE CULTURE     Status: None   Collection Time    07/10/12  9:35 AM      Result Value Range Status   Specimen Description URINE, CATHETERIZED   Final   Special Requests NONE   Final   Culture  Setup Time 07/10/2012 18:01   Final   Colony Count 40,000 COLONIES/ML   Final   Culture     Final   Value: Multiple bacterial morphotypes present, none predominant. Suggest appropriate recollection if clinically indicated.   Report Status 07/11/2012 FINAL   Final  CLOSTRIDIUM DIFFICILE BY PCR     Status: Abnormal   Collection Time    07/10/12  9:51 AM      Result Value Range Status   C difficile by pcr POSITIVE (*) NEGATIVE Final   Comment: CRITICAL RESULT CALLED TO, READ BACK BY AND VERIFIED WITH:     J.HAMCE,RN 07/10/12 1415 EHOWARD  STOOL CULTURE     Status: None   Collection Time    07/10/12  9:51 AM      Result Value Range Status   Specimen Description STOOL   Final   Special Requests NONE   Final   Culture     Final   Value: NO SALMONELLA, SHIGELLA, CAMPYLOBACTER, YERSINIA, OR E.COLI 0157:H7 ISOLATED   Report Status 07/14/2012 FINAL   Final     Studies: Dg Abd 1 View  07/13/2012  *RADIOLOGY REPORT*  Clinical Data: Abdominal discomfort.  ABDOMEN - 1 VIEW  Comparison: C T 07/09/2012  Findings: A nonobstructive bowel gas pattern.  No free air, organomegaly or suspicious calcification.  No acute bony abnormality.  IMPRESSION: No acute findings.   Original Report Authenticated By: Charlett Nose, M.D.     Scheduled Meds: . aspirin  81 mg Oral Daily  . bimatoprost  1 drop Both  Eyes BID  . donepezil  10 mg Oral QHS  . enoxaparin (LOVENOX) injection  30 mg Subcutaneous Q24H  .  HYDROmorphone (DILAUDID) injection  1 mg Intravenous Once  . insulin aspart  0-9 Units Subcutaneous TID WC  . labetalol  100 mg Oral BID  . levETIRAcetam  500 mg Oral BID  . ondansetron (ZOFRAN) IV  4 mg Intravenous Once  . pregabalin  50 mg Oral TID  . vancomycin  125 mg Oral Q6H   Continuous Infusions: . lactated ringers 75 mL/hr at 07/14/12 0249    Principal Problem:   Enteritis Active Problems:   DM   Hyperglycemia   Nausea &  vomiting   Diarrhea   Abdominal pain, diffuse   ARF (acute renal failure)    Maron Stanzione A, MD  [facility] Pager [phone]. If 7PM-7AM, please contact night-coverage at [email or web address], password [facility] 07/14/2012, 10:11 AM  LOS: 5 days

## 2012-07-15 DIAGNOSIS — A0472 Enterocolitis due to Clostridium difficile, not specified as recurrent: Principal | ICD-10-CM

## 2012-07-15 DIAGNOSIS — N179 Acute kidney failure, unspecified: Secondary | ICD-10-CM

## 2012-07-15 DIAGNOSIS — E119 Type 2 diabetes mellitus without complications: Secondary | ICD-10-CM

## 2012-07-15 LAB — CBC
MCH: 26.8 pg (ref 26.0–34.0)
MCHC: 32.4 g/dL (ref 30.0–36.0)
MCV: 82.5 fL (ref 78.0–100.0)
Platelets: 251 10*3/uL (ref 150–400)

## 2012-07-15 LAB — BASIC METABOLIC PANEL
Calcium: 8.9 mg/dL (ref 8.4–10.5)
Creatinine, Ser: 1.57 mg/dL — ABNORMAL HIGH (ref 0.50–1.10)
GFR calc non Af Amer: 31 mL/min — ABNORMAL LOW (ref >90)
Glucose, Bld: 138 mg/dL — ABNORMAL HIGH (ref 70–99)
Sodium: 136 mEq/L (ref 135–145)

## 2012-07-15 LAB — GLUCOSE, CAPILLARY: Glucose-Capillary: 126 mg/dL — ABNORMAL HIGH (ref 70–99)

## 2012-07-15 NOTE — Discharge Summary (Signed)
Physician Discharge Summary  Glenda Gonzalez YNW:[id number] DOB: Mar 12, 1934 DOA: 07/09/2012  PCP: Billee Cashing, MD  Admit date: 07/09/2012 Discharge date: 07/15/2012  Time spent: 35 minutes  Recommendations for Outpatient Follow-up:  Followup with Billee Cashing, MD (PCP) in 1 week.   Need to have CBC and BMET checked at the next clinic visit.  Home health arranged prior to discharge.  Discharge Diagnoses:  Principal Problem:   Enteritis Active Problems:   DM   Hyperglycemia   Nausea & vomiting   Diarrhea   Abdominal pain, diffuse   ARF (acute renal failure)   Discharge Condition: Stable  Diet recommendation: Diabetic diet.  Filed Weights   07/09/12 2155  Weight: 79.833 kg (176 lb)    History of present illness:  History of diabetes mellitus with peripheral neuropathy, dementia, coronary artery disease. Recently admitted 06/08/2012 with questionable seizures, MRI of the brain showed subcentimeter solitary acute infarct right parietal gray matter junction without associated hemorrhage as was advanced atrophy. 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 Echocardiogram with ejection fraction of 55% and normal systolic function. Carotid Dopplers with right 40-59% ICA stenosis. EEG showed no seizure activity. Neurology consulted patient was loaded with Keppra for seizure. Aspirin was added for CVA prophylaxis. Presented on 07/09/2012 with NVD, abdominal pain x 24 hours.  Hospital Course:  C. Diff colitis/nausea/ vomiting /diarrhea On enteric precautions. C diff positive, on po Vanc started 2/15, likely needs 14 days total. Patient's PCP to determine if the patient needs a longer course depending on patient's clinical course after discharge. Initially abdominal pain improved, however on 07/13/2012 patient was complaining of worsening abdominal pain. Abdominal x-ray on 07/13/2012 was negative. Prior to discharge patient  reports abdominal pain is improved.   Acute renal failure due to C. Diff colitis. Improved with IVF. Held ACE and ARB therapies, during the hospital stay. Resume patient's benazepril at discharge.  UTI  Asymptomatic. S/p treatment with Ciprofloxacin   Hyperglycemia/DM2 SSI coverage. Hemoglobin A1C on 07/10/2012 was 10.8. Further management as per PCP after discharge, resume glimepiride at discharge.  Anemia  Stable   Seizure disorder Continue Keppra.  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 Home health PT at discharge per PT recommendations.  Consultants:  None  Procedures:  As above.  Antibiotics:  Oral vancomycin.  Discharge Exam: Filed Vitals:   07/14/12 2047 07/14/12 2102 07/15/12 0615 07/15/12 0938  BP: 180/61 161/72 150/70 161/62  Pulse: 64 97 98 68  Temp: 98.1 F (36.7 C)  98.7 F (37.1 C) 97.7 F (36.5 C)  TempSrc: Oral  Oral Oral  Resp: 18     Height:      Weight:      SpO2: 94%  95% 97%   Discharge Instructions  Discharge Orders   Future Appointments Provider Department Dept Phone   07/16/2012 8:45 AM [facility] [phone]   07/16/2012 10:30 AM [facility]rick Colace, [facility] [phone]   Future Orders Complete By Expires     Diet Carb Modified  As directed     Discharge instructions  As directed     Comments:      Followup with Billee Cashing, MD (PCP) in 1 week.    Increase activity slowly  As directed         Medication List    STOP taking these medications  esomeprazole 40 MG capsule  Commonly known as:  NEXIUM     pantoprazole 40 MG tablet  Commonly known as:  PROTONIX      TAKE these medications       aspirin 81 MG chewable tablet  Chew 81 mg by mouth daily.     benazepril 5 MG tablet  Commonly known as:  LOTENSIN  Take 5 mg by mouth daily.     bimatoprost 0.01 % Soln  Commonly  known as:  LUMIGAN  Apply 1 drop to eye 2 (two) times daily.     donepezil 10 MG tablet  Commonly known as:  ARICEPT  Take 10 mg by mouth at bedtime.     glimepiride 2 MG tablet  Commonly known as:  AMARYL  Take 2 mg by mouth daily before breakfast.     labetalol 100 MG tablet  Commonly known as:  NORMODYNE  Take 100 mg by mouth 2 (two) times daily.     levETIRAcetam 500 MG tablet  Commonly known as:  KEPPRA  Take 500 mg by mouth 2 (two) times daily.     pregabalin 50 MG capsule  Commonly known as:  LYRICA  Take 50 mg by mouth 3 (three) times daily.     senna 8.6 MG Tabs  Commonly known as:  SENOKOT  Take 1 tablet by mouth at bedtime.     SYSTANE ULTRA OP  Place 1 drop into both eyes daily as needed. For dry eyes     vancomycin 50 mg/mL oral solution  Commonly known as:  VANCOCIN  Take 2.5 mLs (125 mg total) by mouth every 6 (six) hours. For 11 more days following discharge.     zolpidem 10 MG tablet  Commonly known as:  AMBIEN  Take 10 mg by mouth at bedtime as needed for sleep.          The results of significant diagnostics from this hospitalization (including imaging, microbiology, ancillary and laboratory) are listed below for reference.    Significant Diagnostic Studies: Dg Abd 1 View  07/13/2012  *RADIOLOGY REPORT*  Clinical Data: Abdominal discomfort.  ABDOMEN - 1 VIEW  Comparison: C T 07/09/2012  Findings: A nonobstructive bowel gas pattern.  No free air, organomegaly or suspicious calcification.  No acute bony abnormality.  IMPRESSION: No acute findings.   Original Report Authenticated By: Charlett Nose, M.D.    Dg Abd 1 View  06/19/2012  *RADIOLOGY REPORT*  Clinical Data: 79 year old female abdominal pain.  ABDOMEN - 1 VIEW  Comparison: CT abdomen pelvis 03/08/2012.  Findings: Supine AP view at 1457 hours. Nonobstructed bowel gas pattern.  Evidence of hiatal hernia.  Stable pessary. No acute osseous abnormality identified.    Calcified atherosclerosis. Small  surgical clips project over the central sacrum.  IMPRESSION: Nonobstructed bowel gas pattern.   Original Report Authenticated By: Erskine Speed, M.D.    Ct Abdomen Pelvis W Contrast  07/09/2012  *RADIOLOGY REPORT*  Clinical Data: 79 year old female with left-sided abdominal and pelvic pain.  CT ABDOMEN AND PELVIS WITH CONTRAST  Technique:  Multidetector CT imaging of the abdomen and pelvis was performed following the standard protocol during bolus administration of intravenous contrast.  Contrast: 80mL OMNIPAQUE IOHEXOL 300 MG/ML  SOLN  Comparison: 03/08/2012 CT  Findings: There is diffuse circumferential wall thickening of a large segment of mid small bowel, primarily within the left and lower abdomen and pelvis.  A small amount of ascites is noted. There is no evidence of pneumoperitoneum, bowel obstruction  or pneumatosis intestinalis. Omental stranding in the left abdomen noted and may be secondary to the enteritis.  The liver, spleen, adrenal glands, pancreas and gallbladder are unremarkable. Bilateral renal cortical atrophy and left renal scarring noted.  There is no evidence of biliary dilatation, enlarged lymph nodes or abdominal aortic aneurysm.  The visualized mesenteric arteries are patent.  Hysterectomy and pessary again noted. A curvilinear density within the sigmoid colon is unchanged. Postoperative changes within the anterior abdominal/pelvic wall again noted.  No acute or suspicious bony abnormalities are noted.  IMPRESSION: Diffuse long segment circumferential small bowel wall thickening and adjacent inflammation with small amount of ascites - likely inflammatory or infectious enteritis. No evidence of bowel obstruction or pneumoperitoneum.  Renal atrophy and left renal scarring.   Original Report Authenticated By: Harmon Pier, M.D.     Microbiology: Recent Results (from the past 240 hour(s))  URINE CULTURE     Status: None   Collection Time    07/10/12  9:35 AM      Result Value Range Status    Specimen Description URINE, CATHETERIZED   Final   Special Requests NONE   Final   Culture  Setup Time 07/10/2012 18:01   Final   Colony Count 40,000 COLONIES/ML   Final   Culture     Final   Value: Multiple bacterial morphotypes present, none predominant. Suggest appropriate recollection if clinically indicated.   Report Status 07/11/2012 FINAL   Final  CLOSTRIDIUM DIFFICILE BY PCR     Status: Abnormal   Collection Time    07/10/12  9:51 AM      Result Value Range Status   C difficile by pcr POSITIVE (*) NEGATIVE Final   Comment: CRITICAL RESULT CALLED TO, READ BACK BY AND VERIFIED WITH:     J.HAMCE,RN 07/10/12 1415 EHOWARD  STOOL CULTURE     Status: None   Collection Time    07/10/12  9:51 AM      Result Value Range Status   Specimen Description STOOL   Final   Special Requests NONE   Final   Culture     Final   Value: NO SALMONELLA, SHIGELLA, CAMPYLOBACTER, YERSINIA, OR E.COLI 0157:H7 ISOLATED   Report Status 07/14/2012 FINAL   Final     Labs: Basic Metabolic Panel:  Recent Labs Lab 07/11/12 0915 07/12/12 0705 07/13/12 0430 07/14/12 0440 07/15/12 0535  NA 129* 137 137 134* 136  K 3.7 3.8 4.2 4.4 4.7  CL 99 105 106 104 104  CO2 17* 17* 18* 19 19  GLUCOSE 125* 83 186* 153* 138*  BUN 43* 44* 41* 36* 35*  CREATININE 2.18* 2.10* 1.87* 1.76* 1.57*  CALCIUM 8.0* 8.1* 8.5 8.6 8.9   Liver Function Tests:  Recent Labs Lab 07/09/12 1508  AST 32  ALT 17  ALKPHOS 163*  BILITOT 0.3  PROT 7.6  ALBUMIN 3.1*    Recent Labs Lab 07/09/12 1650  LIPASE 47   No results found for this basename: AMMONIA,  in the last 168 hours CBC:  Recent Labs Lab 07/09/12 1508  07/11/12 1410 07/12/12 0705 07/13/12 0430 07/14/12 0440 07/15/12 0535  WBC 10.0  < > 6.0 5.3 6.5 6.4 5.8  NEUTROABS 7.2  --   --   --   --   --   --   HGB 12.3  < > 9.0* 8.4* 8.8* 8.6* 8.4*  HCT 38.4  < > 27.5* 25.9* 27.6* 27.4* 25.9*  MCV 83.7  < > 83.6 83.8 84.4  84.0 82.5  PLT 338  < > 244 243 259  240 251  < > = values in this interval not displayed. Cardiac Enzymes: No results found for this basename: CKTOTAL, CKMB, CKMBINDEX, TROPONINI,  in the last 168 hours BNP: BNP (last 3 results)  Recent Labs  06/08/12 2309  PROBNP 234.3   CBG:  Recent Labs Lab 07/14/12 0748 07/14/12 1215 07/14/12 1650 07/14/12 2050 07/15/12 0751  GLUCAP 143* 168* 194* 190* 111*       Signed:  Luwanda Starr A  [facility] 07/15/2012, 10:47 AM

## 2012-07-15 NOTE — Progress Notes (Signed)
[facility] PROGRESS NOTE  IVANKA KIRSHNER AOZ:[id number] DOB: 27-Mar-1934 DOA: 07/09/2012 PCP: Billee Cashing, MD  Brief Narrative: History of diabetes mellitus with peripheral neuropathy, dementia, coronary artery disease. Recently admitted 06/08/2012 with questionable seizures, MRI of the brain showed subcentimeter solitary acute infarct right parietal gray matter junction without associated hemorrhage as was advanced atrophy. 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 Echocardiogram with ejection fraction of 55% and normal systolic function. Carotid Dopplers with right 40-59% ICA stenosis. EEG showed no seizure activity. Neurology consulted patient was loaded with Keppra for seizure. Aspirin was added for CVA prophylaxis. Subcutaneous heparin for DVT prophylaxis. Patient did require intubation for airway protection and was extubated 06/10/2012 without difficulty. Presenting now with NVD, abdominal pain x 24 hours.  Assessment/Plan: C. Diff colitis/nausea/ vomiting /diarrhea On enteric precautions. C diff positive, on po Vanc started 2/15, likely needs 14 days total. Patient's PCP to determine if the patient needs a longer course depending on patient's clinical course after discharge. Initially abdominal pain improved, however on 07/13/2012 patient was complaining of worsening abdominal pain. Abdominal x-ray on 07/13/2012 was negative. Prior to discharge patient reports abdominal pain is improved.   Acute renal failure due to C. Diff colitis. Improved with IVF. Hold ACE and ARB therapies, patient's PCP to determine when to resume these patient's benazepril.  UTI  Asymptomatic. S/p treatment with Ciprofloxacin   Hyperglycemia/DM2 SSI coverage. Hemoglobin A1C on 07/10/2012 was 10.8. Further management as per PCP after discharge, resume glimepiride at discharge.  Anemia  Stable   Seizure disorder Continue Keppra.  Recent history  of thrombotic right parietal infarct with seizure disorder Continue ASA. Home health PT at discharge per PT recommendations.  Code Status: Full code Family Communication: No family at bedside. Disposition Plan: DC home today.   Consultants:  None  Procedures:  As above.  Antibiotics:  Oral vancomycin.  HPI/Subjective: Abdominal pain improving. Diarrhea improving.   Objective: Filed Vitals:   07/14/12 2047 07/14/12 2102 07/15/12 0615 07/15/12 0938  BP: 180/61 161/72 150/70 161/62  Pulse: 64 97 98 68  Temp: 98.1 F (36.7 C)  98.7 F (37.1 C) 97.7 F (36.5 C)  TempSrc: Oral  Oral Oral  Resp: 18     Height:      Weight:      SpO2: 94%  95% 97%    Intake/Output Summary (Last 24 hours) at 07/15/12 1040 Last data filed at 07/14/12 1346  Gross per 24 hour  Intake    240 ml  Output      0 ml  Net    240 ml   Filed Weights   07/09/12 2155  Weight: 79.833 kg (176 lb)    Exam: Physical Exam: General: Awake, Oriented, No acute distress. HEENT: EOMI. Neck: Supple CV: S1 and S2 Lungs: Clear to ascultation bilaterally Abdomen: Soft, minimal tenderness in the LLQ, improved from yesterday, Nondistended, +bowel sounds. Ext: Good pulses. Trace edema.  Data Reviewed: Basic Metabolic Panel:  Recent Labs Lab 07/11/12 0915 07/12/12 0705 07/13/12 0430 07/14/12 0440 07/15/12 0535  NA 129* 137 137 134* 136  K 3.7 3.8 4.2 4.4 4.7  CL 99 105 106 104 104  CO2 17* 17* 18* 19 19  GLUCOSE 125* 83 186* 153* 138*  BUN 43* 44* 41* 36* 35*  CREATININE 2.18* 2.10* 1.87* 1.76* 1.57*  CALCIUM 8.0* 8.1* 8.5 8.6 8.9   Liver Function Tests:  Recent Labs Lab 07/09/12 1508  AST 32  ALT 17  ALKPHOS 163*  BILITOT 0.3  PROT 7.6  ALBUMIN 3.1*    Recent Labs Lab 07/09/12 1650  LIPASE 47   No results found for this basename: AMMONIA,  in the last 168 hours CBC:  Recent Labs Lab 07/09/12 1508  07/11/12 1410 07/12/12 0705 07/13/12 0430 07/14/12 0440  07/15/12 0535  WBC 10.0  < > 6.0 5.3 6.5 6.4 5.8  NEUTROABS 7.2  --   --   --   --   --   --   HGB 12.3  < > 9.0* 8.4* 8.8* 8.6* 8.4*  HCT 38.4  < > 27.5* 25.9* 27.6* 27.4* 25.9*  MCV 83.7  < > 83.6 83.8 84.4 84.0 82.5  PLT 338  < > 244 243 259 240 251  < > = values in this interval not displayed. Cardiac Enzymes: No results found for this basename: CKTOTAL, CKMB, CKMBINDEX, TROPONINI,  in the last 168 hours BNP (last 3 results)  Recent Labs  06/08/12 2309  PROBNP 234.3   CBG:  Recent Labs Lab 07/14/12 0748 07/14/12 1215 07/14/12 1650 07/14/12 2050 07/15/12 0751  GLUCAP 143* 168* 194* 190* 111*    Recent Results (from the past 240 hour(s))  URINE CULTURE     Status: None   Collection Time    07/10/12  9:35 AM      Result Value Range Status   Specimen Description URINE, CATHETERIZED   Final   Special Requests NONE   Final   Culture  Setup Time 07/10/2012 18:01   Final   Colony Count 40,000 COLONIES/ML   Final   Culture     Final   Value: Multiple bacterial morphotypes present, none predominant. Suggest appropriate recollection if clinically indicated.   Report Status 07/11/2012 FINAL   Final  CLOSTRIDIUM DIFFICILE BY PCR     Status: Abnormal   Collection Time    07/10/12  9:51 AM      Result Value Range Status   C difficile by pcr POSITIVE (*) NEGATIVE Final   Comment: CRITICAL RESULT CALLED TO, READ BACK BY AND VERIFIED WITH:     J.HAMCE,RN 07/10/12 1415 EHOWARD  STOOL CULTURE     Status: None   Collection Time    07/10/12  9:51 AM      Result Value Range Status   Specimen Description STOOL   Final   Special Requests NONE   Final   Culture     Final   Value: NO SALMONELLA, SHIGELLA, CAMPYLOBACTER, YERSINIA, OR E.COLI 0157:H7 ISOLATED   Report Status 07/14/2012 FINAL   Final     Studies: Dg Abd 1 View  07/13/2012  *RADIOLOGY REPORT*  Clinical Data: Abdominal discomfort.  ABDOMEN - 1 VIEW  Comparison: C T 07/09/2012  Findings: A nonobstructive bowel gas  pattern.  No free air, organomegaly or suspicious calcification.  No acute bony abnormality.  IMPRESSION: No acute findings.   Original Report Authenticated By: Charlett Nose, M.D.     Scheduled Meds: . aspirin  81 mg Oral Daily  . bimatoprost  1 drop Both Eyes BID  . donepezil  10 mg Oral QHS  . enoxaparin (LOVENOX) injection  30 mg Subcutaneous Q24H  .  HYDROmorphone (DILAUDID) injection  1 mg Intravenous Once  . insulin aspart  0-9 Units Subcutaneous TID WC  . labetalol  100 mg Oral BID  . levETIRAcetam  500 mg Oral BID  . ondansetron (ZOFRAN) IV  4 mg Intravenous Once  . pregabalin  50  mg Oral TID  . vancomycin  125 mg Oral Q6H   Continuous Infusions: . lactated ringers 75 mL/hr at 07/15/12 2956    Principal Problem:   Enteritis Active Problems:   DM   Hyperglycemia   Nausea & vomiting   Diarrhea   Abdominal pain, diffuse   ARF (acute renal failure)    Linzee Depaul A, MD  [facility] Pager [phone]. If 7PM-7AM, please contact night-coverage at [email or web address], password [facility] 07/15/2012, 10:40 AM  LOS: 6 days

## 2012-07-15 NOTE — Progress Notes (Signed)
Physical Therapy Treatment Patient Details Name: Glenda Gonzalez MRN: [id number] DOB: 1933-07-18 Today's Date: 07/15/2012 Time: 9562-1308 PT Time Calculation (min): 24 min  PT Assessment / Plan / Recommendation Comments on Treatment Session  Pt. asking for bedpan, but agreeable to use of 3n1.  Able to walk short distance in room after use of 3n1 for BM and urination.  Encouraged pt. to ask for assist to use 3n1, as this is what she uses at home.  She is agreeable .  Athis should help her regain some of her strength. and activity tolerance.    Follow Up Recommendations  Home health PT;Supervision/Assistance - 24 hour     Does the patient have the potential to tolerate intense rehabilitation     Barriers to Discharge        Equipment Recommendations  None recommended by PT    Recommendations for Other Services    Frequency Min 3X/week   Plan Discharge plan remains appropriate;Frequency remains appropriate    Precautions / Restrictions Precautions Precautions: Fall Restrictions Weight Bearing Restrictions: No   Pertinent Vitals/Pain No pain indicated    Mobility  Bed Mobility Bed Mobility: Rolling Right;Right Sidelying to Sit;Sit to Supine Rolling Right: 5: Supervision Right Sidelying to Sit: 3: Mod assist;With rails;HOB flat Sitting - Scoot to Edge of Bed: 5: Supervision Sit to Supine: Not Tested (comment) Transfers Transfers: Sit to Stand;Stand to Sit Sit to Stand: 4: Min assist;With upper extremity assist;From bed;From chair/3-in-1 Stand to Sit: 4: Min assist;With upper extremity assist;With armrests;To chair/3-in-1 Details for Transfer Assistance: Verbal cues for hand placement. Ambulation/Gait Ambulation/Gait Assistance: 4: Min assist Ambulation Distance (Feet): 10 Feet Assistive device: Rolling walker Ambulation/Gait Assistance Details: min assist for short distance ambulation in room; pt. weak and did not want  to attempt more Gait Pattern: Step-to  pattern;Decreased step length - right;Decreased step length - left;Shuffle;Trunk flexed Gait velocity: decr    Exercises     PT Diagnosis:    PT Problem List:   PT Treatment Interventions:     PT Goals Acute Rehab PT Goals PT Goal Formulation: With patient Time For Goal Achievement: 07/24/12 Potential to Achieve Goals: Good Pt will go Supine/Side to Sit: with modified independence;with HOB 0 degrees PT Goal: Supine/Side to Sit - Progress: Progressing toward goal Pt will go Sit to Stand: with supervision PT Goal: Sit to Stand - Progress: Progressing toward goal Pt will go Stand to Sit: with supervision PT Goal: Stand to Sit - Progress: Progressing toward goal Pt will Ambulate: 51 - 150 feet;with least restrictive assistive device;with supervision PT Goal: Ambulate - Progress: Progressing toward goal  Visit Information  Last PT Received On: 07/15/12 Assistance Needed: +1    Subjective Data  Subjective: "Do you know if I'm going home today ?"   Cognition  Cognition Overall Cognitive Status: Appears within functional limits for tasks assessed/performed Arousal/Alertness: Awake/alert Orientation Level: Appears intact for tasks assessed Behavior During Session: [facility] for tasks performed    Balance     End of Session PT - End of Session Equipment Utilized During Treatment: Gait belt Activity Tolerance: Patient limited by fatigue Patient left: in chair;with call bell/phone within reach;with nursing in room Nurse Communication: Mobility status   GP     Ferman Hamming 07/15/2012, 12:22 PM Weldon Picking PT Acute Rehab Services [phone] Beeper [phone]

## 2012-07-16 ENCOUNTER — Inpatient Hospital Stay: Payer: Medicare Other | Admitting: Physical Medicine & Rehabilitation

## 2012-07-16 ENCOUNTER — Encounter: Payer: Medicare Other | Attending: Physical Medicine & Rehabilitation

## 2012-07-18 ENCOUNTER — Inpatient Hospital Stay (HOSPITAL_COMMUNITY)
Admission: EM | Admit: 2012-07-18 | Discharge: 2012-07-27 | DRG: 915 | Disposition: A | Discharge status: Home Health | Payer: Medicare Other | Attending: Internal Medicine | Admitting: Internal Medicine

## 2012-07-18 ENCOUNTER — Encounter (HOSPITAL_COMMUNITY): Payer: Self-pay | Admitting: Emergency Medicine

## 2012-07-18 ENCOUNTER — Emergency Department (HOSPITAL_COMMUNITY): Payer: Medicare Other | Admitting: Anesthesiology

## 2012-07-18 ENCOUNTER — Inpatient Hospital Stay (HOSPITAL_COMMUNITY): Payer: Medicare Other

## 2012-07-18 ENCOUNTER — Emergency Department (HOSPITAL_COMMUNITY): Payer: Medicare Other

## 2012-07-18 ENCOUNTER — Encounter (HOSPITAL_COMMUNITY): Payer: Self-pay | Admitting: Anesthesiology

## 2012-07-18 DIAGNOSIS — G9340 Encephalopathy, unspecified: Secondary | ICD-10-CM

## 2012-07-18 DIAGNOSIS — G40909 Epilepsy, unspecified, not intractable, without status epilepticus: Secondary | ICD-10-CM | POA: Diagnosis present

## 2012-07-18 DIAGNOSIS — F0390 Unspecified dementia without behavioral disturbance: Secondary | ICD-10-CM | POA: Diagnosis present

## 2012-07-18 DIAGNOSIS — N183 Chronic kidney disease, stage 3 unspecified: Secondary | ICD-10-CM | POA: Diagnosis present

## 2012-07-18 DIAGNOSIS — R404 Transient alteration of awareness: Secondary | ICD-10-CM | POA: Diagnosis not present

## 2012-07-18 DIAGNOSIS — T464X5A Adverse effect of angiotensin-converting-enzyme inhibitors, initial encounter: Secondary | ICD-10-CM

## 2012-07-18 DIAGNOSIS — I639 Cerebral infarction, unspecified: Secondary | ICD-10-CM

## 2012-07-18 DIAGNOSIS — T783XXA Angioneurotic edema, initial encounter: Principal | ICD-10-CM

## 2012-07-18 DIAGNOSIS — J9600 Acute respiratory failure, unspecified whether with hypoxia or hypercapnia: Secondary | ICD-10-CM

## 2012-07-18 DIAGNOSIS — T465X5A Adverse effect of other antihypertensive drugs, initial encounter: Secondary | ICD-10-CM

## 2012-07-18 DIAGNOSIS — I5022 Chronic systolic (congestive) heart failure: Secondary | ICD-10-CM | POA: Diagnosis present

## 2012-07-18 DIAGNOSIS — I498 Other specified cardiac arrhythmias: Secondary | ICD-10-CM | POA: Diagnosis not present

## 2012-07-18 DIAGNOSIS — G250 Essential tremor: Secondary | ICD-10-CM | POA: Diagnosis present

## 2012-07-18 DIAGNOSIS — N179 Acute kidney failure, unspecified: Secondary | ICD-10-CM

## 2012-07-18 DIAGNOSIS — I10 Essential (primary) hypertension: Secondary | ICD-10-CM | POA: Diagnosis present

## 2012-07-18 DIAGNOSIS — F068 Other specified mental disorders due to known physiological condition: Secondary | ICD-10-CM

## 2012-07-18 DIAGNOSIS — I509 Heart failure, unspecified: Secondary | ICD-10-CM | POA: Diagnosis present

## 2012-07-18 DIAGNOSIS — R5381 Other malaise: Secondary | ICD-10-CM | POA: Diagnosis not present

## 2012-07-18 DIAGNOSIS — T502X5A Adverse effect of carbonic-anhydrase inhibitors, benzothiadiazides and other diuretics, initial encounter: Secondary | ICD-10-CM | POA: Diagnosis present

## 2012-07-18 DIAGNOSIS — Z8673 Personal history of transient ischemic attack (TIA), and cerebral infarction without residual deficits: Secondary | ICD-10-CM

## 2012-07-18 DIAGNOSIS — I129 Hypertensive chronic kidney disease with stage 1 through stage 4 chronic kidney disease, or unspecified chronic kidney disease: Secondary | ICD-10-CM | POA: Diagnosis present

## 2012-07-18 DIAGNOSIS — J9601 Acute respiratory failure with hypoxia: Secondary | ICD-10-CM

## 2012-07-18 DIAGNOSIS — A0472 Enterocolitis due to Clostridium difficile, not specified as recurrent: Secondary | ICD-10-CM

## 2012-07-18 DIAGNOSIS — T464X5S Adverse effect of angiotensin-converting-enzyme inhibitors, sequela: Secondary | ICD-10-CM

## 2012-07-18 DIAGNOSIS — G252 Other specified forms of tremor: Secondary | ICD-10-CM | POA: Diagnosis present

## 2012-07-18 DIAGNOSIS — D638 Anemia in other chronic diseases classified elsewhere: Secondary | ICD-10-CM

## 2012-07-18 DIAGNOSIS — Z87891 Personal history of nicotine dependence: Secondary | ICD-10-CM

## 2012-07-18 DIAGNOSIS — I2510 Atherosclerotic heart disease of native coronary artery without angina pectoris: Secondary | ICD-10-CM | POA: Diagnosis present

## 2012-07-18 DIAGNOSIS — K219 Gastro-esophageal reflux disease without esophagitis: Secondary | ICD-10-CM | POA: Diagnosis present

## 2012-07-18 DIAGNOSIS — E875 Hyperkalemia: Secondary | ICD-10-CM | POA: Diagnosis not present

## 2012-07-18 DIAGNOSIS — R569 Unspecified convulsions: Secondary | ICD-10-CM | POA: Diagnosis present

## 2012-07-18 DIAGNOSIS — R1310 Dysphagia, unspecified: Secondary | ICD-10-CM | POA: Diagnosis not present

## 2012-07-18 DIAGNOSIS — E876 Hypokalemia: Secondary | ICD-10-CM

## 2012-07-18 DIAGNOSIS — T464X5D Adverse effect of angiotensin-converting-enzyme inhibitors, subsequent encounter: Secondary | ICD-10-CM

## 2012-07-18 DIAGNOSIS — E119 Type 2 diabetes mellitus without complications: Secondary | ICD-10-CM

## 2012-07-18 DIAGNOSIS — Z7982 Long term (current) use of aspirin: Secondary | ICD-10-CM

## 2012-07-18 DIAGNOSIS — Z79899 Other long term (current) drug therapy: Secondary | ICD-10-CM

## 2012-07-18 DIAGNOSIS — E870 Hyperosmolality and hypernatremia: Secondary | ICD-10-CM | POA: Diagnosis not present

## 2012-07-18 LAB — BASIC METABOLIC PANEL
BUN: 17 mg/dL (ref 6–23)
Creatinine, Ser: 0.98 mg/dL (ref 0.50–1.10)
GFR calc Af Amer: 63 mL/min — ABNORMAL LOW (ref >90)
GFR calc non Af Amer: 55 mL/min — ABNORMAL LOW (ref >90)

## 2012-07-18 LAB — GLUCOSE, CAPILLARY
Glucose-Capillary: 236 mg/dL — ABNORMAL HIGH (ref 70–99)
Glucose-Capillary: 209 mg/dL — ABNORMAL HIGH (ref 70–99)
Glucose-Capillary: 207 mg/dL — ABNORMAL HIGH (ref 70–99)

## 2012-07-18 LAB — CBC WITH DIFFERENTIAL/PLATELET
Basophils Relative: 1 % (ref 0–1)
Eosinophils Absolute: 0.1 10*3/uL (ref 0.0–0.7)
HCT: 30 % — ABNORMAL LOW (ref 36.0–46.0)
Hemoglobin: 10.1 g/dL — ABNORMAL LOW (ref 12.0–15.0)
MCH: 27.5 pg (ref 26.0–34.0)
MCHC: 33.7 g/dL (ref 30.0–36.0)
Monocytes Absolute: 0.4 10*3/uL (ref 0.1–1.0)
Monocytes Relative: 6 % (ref 3–12)

## 2012-07-18 LAB — MRSA PCR SCREENING: MRSA by PCR: NEGATIVE

## 2012-07-18 MED ORDER — DIPHENHYDRAMINE HCL 50 MG/ML IJ SOLN
50.0000 mg | Freq: Four times a day (QID) | INTRAMUSCULAR | Status: DC
  Administered 2012-07-18 – 2012-07-21 (×12): 50 mg via INTRAVENOUS
  Filled 2012-07-18 (×17): qty 1

## 2012-07-18 MED ORDER — HYDRALAZINE HCL 20 MG/ML IJ SOLN
INTRAMUSCULAR | Status: AC
  Administered 2012-07-18: 20 mg via INTRAVENOUS
  Filled 2012-07-18: qty 1

## 2012-07-18 MED ORDER — DIPHENHYDRAMINE HCL 50 MG/ML IJ SOLN
25.0000 mg | Freq: Three times a day (TID) | INTRAMUSCULAR | Status: DC
  Administered 2012-07-18: 25 mg via INTRAVENOUS
  Filled 2012-07-18: qty 1

## 2012-07-18 MED ORDER — PROPOFOL 10 MG/ML IV EMUL
5.0000 ug/kg/min | INTRAVENOUS | Status: DC
  Administered 2012-07-18 (×2): 5 ug/kg/min via INTRAVENOUS
  Administered 2012-07-19 (×3): 30 ug/kg/min via INTRAVENOUS
  Administered 2012-07-19: 20 ug/kg/min via INTRAVENOUS
  Administered 2012-07-20: 30 ug/kg/min via INTRAVENOUS
  Administered 2012-07-20: 50 ug/kg/min via INTRAVENOUS
  Administered 2012-07-20: 30 ug/kg/min via INTRAVENOUS
  Administered 2012-07-20 – 2012-07-21 (×3): 40 ug/kg/min via INTRAVENOUS
  Filled 2012-07-18 (×11): qty 100

## 2012-07-18 MED ORDER — HYDRALAZINE HCL 20 MG/ML IJ SOLN: 20.0000 mg | Freq: Once | INTRAMUSCULAR | Status: AC

## 2012-07-18 MED ORDER — SODIUM CHLORIDE 0.9 % IV SOLN
INTRAVENOUS | Status: DC
  Administered 2012-07-18: 1.4 [IU]/h via INTRAVENOUS
  Filled 2012-07-18: qty 1

## 2012-07-18 MED ORDER — LEVETIRACETAM 100 MG/ML PO SOLN
500.0000 mg | Freq: Two times a day (BID) | ORAL | Status: DC
  Filled 2012-07-18 (×2): qty 5

## 2012-07-18 MED ORDER — PROPOFOL 10 MG/ML IV EMUL
5.0000 ug/kg/min | Freq: Once | INTRAVENOUS | Status: AC
  Administered 2012-07-18: 5 ug/kg/min via INTRAVENOUS

## 2012-07-18 MED ORDER — SODIUM CHLORIDE 0.9 % IV SOLN: INTRAVENOUS | Status: DC

## 2012-07-18 MED ORDER — METHYLPREDNISOLONE SODIUM SUCC 125 MG IJ SOLR
80.0000 mg | Freq: Four times a day (QID) | INTRAMUSCULAR | Status: DC
  Administered 2012-07-18 (×3): 80 mg via INTRAVENOUS
  Administered 2012-07-19: 125 mg via INTRAVENOUS
  Administered 2012-07-19 – 2012-07-21 (×9): 80 mg via INTRAVENOUS
  Filled 2012-07-18 (×10): qty 1.28
  Filled 2012-07-18: qty 2
  Filled 2012-07-18 (×6): qty 1.28

## 2012-07-18 MED ORDER — FAMOTIDINE IN NACL 20-0.9 MG/50ML-% IV SOLN
20.0000 mg | Freq: Two times a day (BID) | INTRAVENOUS | Status: DC
  Administered 2012-07-18 – 2012-07-22 (×9): 20 mg via INTRAVENOUS
  Filled 2012-07-18 (×11): qty 50

## 2012-07-18 MED ORDER — METHYLPREDNISOLONE SODIUM SUCC 125 MG IJ SOLR
125.0000 mg | Freq: Once | INTRAMUSCULAR | Status: AC
  Administered 2012-07-18: 125 mg via INTRAVENOUS
  Filled 2012-07-18: qty 2

## 2012-07-18 MED ORDER — VANCOMYCIN 50 MG/ML ORAL SOLUTION
125.0000 mg | Freq: Four times a day (QID) | ORAL | Status: DC
  Filled 2012-07-18 (×4): qty 2.5

## 2012-07-18 MED ORDER — SODIUM CHLORIDE 0.9 % IV SOLN
250.0000 mL | INTRAVENOUS | Status: DC | PRN
  Administered 2012-07-21: 500 mL via INTRAVENOUS

## 2012-07-18 MED ORDER — OXYMETAZOLINE HCL 0.05 % NA SOLN
2.0000 | Freq: Three times a day (TID) | NASAL | Status: DC | PRN
  Filled 2012-07-18: qty 15

## 2012-07-18 MED ORDER — PROPOFOL 10 MG/ML IV EMUL
INTRAVENOUS | Status: AC
  Filled 2012-07-18: qty 100

## 2012-07-18 MED ORDER — DIPHENHYDRAMINE HCL 50 MG/ML IJ SOLN
25.0000 mg | Freq: Once | INTRAMUSCULAR | Status: AC
  Administered 2012-07-18: 25 mg via INTRAVENOUS
  Filled 2012-07-18: qty 1

## 2012-07-18 MED ORDER — SODIUM CHLORIDE 0.9 % IV SOLN
500.0000 mg | Freq: Two times a day (BID) | INTRAVENOUS | Status: DC
  Administered 2012-07-18 – 2012-07-25 (×15): 500 mg via INTRAVENOUS
  Filled 2012-07-18 (×16): qty 5

## 2012-07-18 MED ORDER — INSULIN ASPART 100 UNIT/ML ~~LOC~~ SOLN
2.0000 [IU] | SUBCUTANEOUS | Status: DC
  Administered 2012-07-18 (×2): 6 [IU] via SUBCUTANEOUS

## 2012-07-18 MED ORDER — BIOTENE DRY MOUTH MT LIQD
15.0000 mL | Freq: Four times a day (QID) | OROMUCOSAL | Status: DC
  Administered 2012-07-19 – 2012-07-27 (×28): 15 mL via OROMUCOSAL

## 2012-07-18 MED ORDER — HEPARIN SODIUM (PORCINE) 5000 UNIT/ML IJ SOLN
5000.0000 [IU] | Freq: Three times a day (TID) | INTRAMUSCULAR | Status: DC
  Administered 2012-07-18 – 2012-07-27 (×28): 5000 [IU] via SUBCUTANEOUS
  Filled 2012-07-18 (×31): qty 1

## 2012-07-18 MED ORDER — DEXMEDETOMIDINE HCL IN NACL 200 MCG/50ML IV SOLN
0.4000 ug/kg/h | INTRAVENOUS | Status: DC
  Administered 2012-07-18 (×3): 0.5 ug/kg/h via INTRAVENOUS
  Filled 2012-07-18 (×4): qty 50

## 2012-07-18 MED ORDER — METRONIDAZOLE IN NACL 5-0.79 MG/ML-% IV SOLN
500.0000 mg | Freq: Four times a day (QID) | INTRAVENOUS | Status: DC
  Administered 2012-07-18 – 2012-07-21 (×13): 500 mg via INTRAVENOUS
  Filled 2012-07-18 (×14): qty 100

## 2012-07-18 MED ORDER — ONDANSETRON HCL 4 MG/2ML IJ SOLN
INTRAMUSCULAR | Status: AC
  Administered 2012-07-18: 4 mg
  Filled 2012-07-18: qty 2

## 2012-07-18 MED ORDER — CHLORHEXIDINE GLUCONATE 0.12 % MT SOLN
15.0000 mL | Freq: Two times a day (BID) | OROMUCOSAL | Status: DC
  Administered 2012-07-18 – 2012-07-23 (×9): 15 mL via OROMUCOSAL
  Filled 2012-07-18 (×9): qty 15

## 2012-07-18 NOTE — Procedures (Signed)
Central Venous Catheter Insertion Procedure Note RYLEN HOU [id number] [birth year]  Procedure: Insertion of Central Venous Catheter Indications: Drug and/or fluid administration and Frequent blood sampling  Procedure Details Consent: Risks of procedure as well as the alternatives and risks of each were explained to the (patient/caregiver).  Consent for procedure obtained. Time Out: Verified patient identification, verified procedure, site/side was marked, verified correct patient position, special equipment/implants available, medications/allergies/relevent history reviewed, required imaging and test results available.  Performed Real time [place] used to ID and cannulate. After failed right IJ attempt.  Maximum sterile technique was used including antiseptics, cap, gloves, gown, hand hygiene, mask and sheet. Skin prep: Chlorhexidine; local anesthetic administered A antimicrobial bonded/coated triple lumen catheter was placed in the left internal jugular vein using the Seldinger technique.  Evaluation Blood flow good Complications: No apparent complications Patient did tolerate procedure well. Chest X-ray ordered to verify placement.  CXR: pending.  BABCOCK,PETE 07/18/2012, 8:42 AM  I was present for this procedure. Dorcas Carrow Beeper  [phone]  Cell  [phone]  If no response or cell goes to voicemail, call beeper [phone]

## 2012-07-18 NOTE — ED Notes (Signed)
Pt becoming nauseous and vomiting. Zofran ordered.

## 2012-07-18 NOTE — ED Notes (Signed)
Respiratory therapist at bedside.

## 2012-07-18 NOTE — Progress Notes (Signed)
Pt brought from [facility] after an allergic reaction that caused her tongue to swell.  MD came to intubate pt.  Intubation went well and pt is now sedated and tolerating well.

## 2012-07-18 NOTE — ED Notes (Signed)
Pt presents to the ED with swollen tongue and excessive salivation. Pt is taking blood pressure medication.

## 2012-07-18 NOTE — Consult Note (Signed)
Glenda Gonzalez, Glenda Gonzalez [id number] Jul 03, 1933 Glenda Mackie, MD  Reason for Consult: angioedema  HPI: 79 yo who is on benazepril, and had acute lip and tongue swelling this morning and presented to the ED with her husband. She was poorly tolerating her secretions and vomiting, ENT was consulted emergently for possible surgical airway, anesthesia was called for emergent nasal intubation. When seen the patient had already been intubated via right nasotracheal intubation by anesthesia. Patient's husband reports that she had another similar episode of angioedema 20 years ago.  Allergies:  Allergies  Allergen Reactions  . Codeine Other (See Comments)    hallucination  . Fentanyl Other (See Comments)    Abnormal behavior per husband  . Morphine Itching  . Penicillins Itching    ROS: unable to obtain x 12 due to intubation.  PMH:  Past Medical History  Diagnosis Date  . Diabetes mellitus   . GERD (gastroesophageal reflux disease)   . Hypertension   . Diverticulitis   . Gout   . Essential and other specified forms of tremor   . Dementia   . CAD (coronary artery disease)   . CHF (congestive heart failure)     Diastolic dysfunction  . Stroke   . Seizures     r/t stroke    FH:  Family History  Problem Relation Age of Onset  . Colon cancer Neg Hx   . Heart disease Father   . Heart disease Brother     SH:  History   Social History  . Marital Status: Married    Spouse Name: N/A    Number of Children: N/A  . Years of Education: N/A   Occupational History  . Not on file.   Social History Main Topics  . Smoking status: Former Games developer  . Smokeless tobacco: Never Used  . Alcohol Use: No  . Drug Use: No  . Sexually Active: Not on file   Other Topics Concern  . Not on file   Social History Narrative  . No narrative on file    PSH:  Past Surgical History  Procedure Laterality Date  . Dilation and curettage of uterus    . Tumor removal     Medications:  No current  facility-administered medications on file prior to encounter.   Current Outpatient Prescriptions on File Prior to Encounter  Medication Sig Dispense Refill  . aspirin 81 MG chewable tablet Chew 81 mg by mouth daily.      . benazepril (LOTENSIN) 5 MG tablet Take 5 mg by mouth daily.      . bimatoprost (LUMIGAN) 0.01 % SOLN Apply 1 drop to eye 2 (two) times daily.      Marland Kitchen donepezil (ARICEPT) 10 MG tablet Take 10 mg by mouth at bedtime.      Marland Kitchen glimepiride (AMARYL) 2 MG tablet Take 2 mg by mouth daily before breakfast.      . labetalol (NORMODYNE) 100 MG tablet Take 100 mg by mouth 2 (two) times daily.      Marland Kitchen levETIRAcetam (KEPPRA) 500 MG tablet Take 500 mg by mouth 2 (two) times daily.      Bertram Gala Glycol-Propyl Glycol (SYSTANE ULTRA OP) Place 1 drop into both eyes daily as needed. For dry eyes      . pregabalin (LYRICA) 50 MG capsule Take 50 mg by mouth 3 (three) times daily.      Marland Kitchen senna (SENOKOT) 8.6 MG TABS Take 1 tablet by mouth at bedtime.      Marland Kitchen  vancomycin (VANCOCIN) 50 mg/mL oral solution Take 2.5 mLs (125 mg total) by mouth every 6 (six) hours. For 11 more days following discharge.  110 mL  0  . zolpidem (AMBIEN) 10 MG tablet Take 10 mg by mouth at bedtime as needed for sleep.        Physical  Exam: CN 2-12 grossly intact and symmetric. EAC/TMs normal BL. Some mild bleeding from both nares. ETT in right nasal cavity. Skin warm and dry. EOMI, PERRLA. Neck supple with no masses or lesions. No lymphadenopathy palpated. I attempted nasopharyngolaryngoscopy through the left nasal cavity, this demonstrated bloody mucous and I was not able to obtain a good view of the nasal cavity, pharynx, or larynx due to the blood, secretions, and ETtube.  A/P: angioedema likely due to benazepril. Recommend standard treatment with admission to ICU, IV H1 and H2 blockers, decadron or solumedrol IV until clinically improved. Once lips and tongue swelling improves can likely extubate if clinically improved and  has air leak around tube. Unable to complete flexible laryngoscopy due to secretions and ETT but can monitor clinically in ICU.   Melvenia Beam 07/18/2012 7:31 AM

## 2012-07-18 NOTE — ED Provider Notes (Signed)
History     CSN: [id number]  Arrival date & time 07/18/12  0608   First MD Initiated Contact with Patient 07/18/12 [phone]      Chief Complaint  Patient presents with  . Allergic Reaction    (Consider location/radiation/quality/duration/timing/severity/associated sxs/prior treatment) HPI 79 year old female presents to the emergency department with complaint of tongue swelling.  She'll woke up with her tongue swelling.  She reports she has been spitting up some blood.  She does not have teeth, is unsure where the blood is coming from.  Furthermore she had similar episode about 20 years ago that required intubation.  Patient is on Lotensin, she denies any prior known history of ACE inhibitor angioedema.  Patient reports she is having difficulties handling her secretion, she is still breathing okay.  Patient reports she is very anxious and scared.  She does not want to be admitted back Hospital, recently had admission for C. difficile and acute renal failure.   Past Medical History  Diagnosis Date  . Diabetes mellitus   . GERD (gastroesophageal reflux disease)   . Hypertension   . Diverticulitis   . Gout   . Essential and other specified forms of tremor   . Dementia   . CAD (coronary artery disease)   . CHF (congestive heart failure)     Diastolic dysfunction  . Stroke   . Seizures     r/t stroke    Past Surgical History  Procedure Laterality Date  . Dilation and curettage of uterus    . Tumor removal      Family History  Problem Relation Age of Onset  . Colon cancer Neg Hx   . Heart disease Father   . Heart disease Brother     History  Substance Use Topics  . Smoking status: Former Games developer  . Smokeless tobacco: Never Used  . Alcohol Use: No    OB History   Grav Para Term Preterm Abortions TAB SAB Ect Mult Living                  Review of Systems  Unable to perform ROS: Acuity of condition    Allergies  Codeine; Fentanyl; Morphine; and Penicillins  Home  Medications   Current Outpatient Rx  Name  Route  Sig  Dispense  Refill  . aspirin 81 MG chewable tablet   Oral   Chew 81 mg by mouth daily.         . benazepril (LOTENSIN) 5 MG tablet   Oral   Take 5 mg by mouth daily.         . bimatoprost (LUMIGAN) 0.01 % SOLN   Ophthalmic   Apply 1 drop to eye 2 (two) times daily.         Marland Kitchen donepezil (ARICEPT) 10 MG tablet   Oral   Take 10 mg by mouth at bedtime.         Marland Kitchen glimepiride (AMARYL) 2 MG tablet   Oral   Take 2 mg by mouth daily before breakfast.         . labetalol (NORMODYNE) 100 MG tablet   Oral   Take 100 mg by mouth 2 (two) times daily.         Marland Kitchen levETIRAcetam (KEPPRA) 500 MG tablet   Oral   Take 500 mg by mouth 2 (two) times daily.         Bertram Gala Glycol-Propyl Glycol (SYSTANE ULTRA OP)   Both Eyes   Place 1 drop  into both eyes daily as needed. For dry eyes         . pregabalin (LYRICA) 50 MG capsule   Oral   Take 50 mg by mouth 3 (three) times daily.         Marland Kitchen senna (SENOKOT) 8.6 MG TABS   Oral   Take 1 tablet by mouth at bedtime.         . vancomycin (VANCOCIN) 50 mg/mL oral solution   Oral   Take 2.5 mLs (125 mg total) by mouth every 6 (six) hours. For 11 more days following discharge.   110 mL   0   . zolpidem (AMBIEN) 10 MG tablet   Oral   Take 10 mg by mouth at bedtime as needed for sleep.           BP 163/86  Pulse 99  Temp(Src) 98.4 F (36.9 C) (Oral)  Resp 18  Wt 176 lb (79.833 kg)  BMI 32.18 kg/m2  SpO2 100%  Physical Exam  Nursing note and vitals reviewed. Constitutional: She appears distressed.  HENT:  Head: Normocephalic and atraumatic.  Patient with significant angioedema.  With tongue blade I am able to see soft palate only unable to see the posterior pharynx.  No active bleeding noted.  Patient is drooling, cannot swallow her secretions.  No angioedema of the lips noted.  Patient is a edentulous  Eyes: Conjunctivae and EOM are normal. Pupils are equal,  round, and reactive to light.  Neck: Normal range of motion. Neck supple. No JVD present. No tracheal deviation present. No thyromegaly present.  Cardiovascular: Normal rate, regular rhythm, normal heart sounds and intact distal pulses.  Exam reveals no gallop and no friction rub.   No murmur heard. Pulmonary/Chest: Breath sounds normal. No stridor. She is in respiratory distress (anxiety, tachypnea). She has no wheezes. She has no rales. She exhibits no tenderness.  Abdominal: Soft. Bowel sounds are normal. She exhibits no distension and no mass. There is no tenderness. There is no rebound and no guarding.  Musculoskeletal: Normal range of motion. She exhibits edema. She exhibits no tenderness.  Lymphadenopathy:    She has no cervical adenopathy.  Skin: Skin is warm and dry. No rash noted. No erythema. No pallor.    ED Course  Procedures (including critical care time)  CRITICAL CARE Performed by: [name]   Total critical care time: 90 min  Critical care time was exclusive of separately billable procedures and treating other patients.  Critical care was necessary to treat or prevent imminent or life-threatening deterioration.  Critical care was time spent personally by me on the following activities: development of treatment plan with patient and/or surrogate as well as nursing, discussions with consultants, evaluation of patient's response to treatment, examination of patient, obtaining history from patient or surrogate, ordering and performing treatments and interventions, ordering and review of laboratory studies, ordering and review of radiographic studies, pulse oximetry and re-evaluation of patient's condition.   Labs Reviewed - No data to display [facility]  07/18/2012  *RADIOLOGY REPORT*  Clinical Data: Respiratory failure and intubation.  PORTABLE CHEST - 1 VIEW  Comparison: 06/12/2012  Findings: Endotracheal tube present with the tip approximately 3.5 cm above  the carina.  Lungs show atelectasis versus infiltrate in the right lower lung.  No pulmonary edema.  Stable cardiomegaly. No pleural effusions identified.  IMPRESSION: Satisfactory positioning of endotracheal tube.  Atelectasis versus infiltrate at the right lung base.   Original Report Authenticated  By: Irish Lack, M.D.     Date: 07/18/2012  Rate: 101  Rhythm: sinus tachycardia  QRS Axis: normal  Intervals: PR prolonged  ST/T Wave abnormalities: normal  Conduction Disutrbances:first-degree A-V block   Narrative Interpretation:   Old EKG Reviewed: unchanged    1. Angioedema       MDM  79 year old female with angioedema.  I have essentially no view of her airway.  Her tongue is swelling minute by minute.  I am extremely concerned about losing her airway and requiring a surgical airway.  Consult with anesthesia and ENT for their help in managing her airway.  Dr. Gelene Mink came to the bedside, and performed a awake nasal intubation.  I also discussed the case with ENT, Dr. Emeline Darling, who was in route to help with any surgical airway.  Patient was nasally intubated.  She is having some oozing from her right naris, no other complications.  She is being sedated with propofol.  I discussed the case with critical care who will admit.  Patient has been given Solu-Medrol and Benadryl for any allergic reaction component.          [name], MD 07/18/12 [phone]

## 2012-07-18 NOTE — ED Notes (Signed)
IV team at bedside to attempt IV access 

## 2012-07-18 NOTE — H&P (Addendum)
PULMONARY  / CRITICAL CARE MEDICINE  Name: Glenda Gonzalez MRN: [id number] DOB: [birth year]    ADMISSION DATE:  07/18/2012 REFERRING MD :  EDP  CHIEF COMPLAINT:  Angioedema  BRIEF PATIENT DESCRIPTION: 79 year old [name] just discharged from the hospital several days ago returns with acute angioedema likely due to ACE inhibitor use. The patient was intubated nasally in the emergency room by anesthesia and critical care is now admitting area past history was significant for coronary artery disease, dementia, congestive heart failure, reflux disease, hypertension, and recurrent C. difficile colitis  SIGNIFICANT EVENTS / STUDIES:  07/18/2012: Intubated nasally in the emergency room by anesthesia  LINES / TUBES: Central line 07/18/2012 Nasal intubation 07/18/2012  CULTURES: Respiratory culture 07/18/2012  ANTIBIOTICS: Vancomycin orally  HISTORY OF PRESENT ILLNESS:  78 rolled [name] just discharged from the hospital several days ago returns with acute angioedema due to ACE inhibitor use. The patient was intubated in the emergency department by anesthesia. The patient's husband states she's had acute onset of tongue swelling over last 24 hours. While in the emergency room the tongue is swelling continued to the point where the patient had to be intubated nasally. The patient's now on full mechanical ventilatory support and critical care is been asked to admit.  PAST MEDICAL HISTORY :  Past Medical History  Diagnosis Date  . Diabetes mellitus   . GERD (gastroesophageal reflux disease)   . Hypertension   . Diverticulitis   . Gout   . Essential and other specified forms of tremor   . Dementia   . CAD (coronary artery disease)   . CHF (congestive heart failure)     Diastolic dysfunction  . Stroke   . Seizures     r/t stroke   Past Surgical History  Procedure Laterality Date  . Dilation and curettage of uterus    . Tumor removal     Prior to  Admission medications   Medication Sig Start Date End Date Taking? Authorizing Provider  aspirin 81 MG chewable tablet Chew 81 mg by mouth daily.   Yes Historical Provider, MD  benazepril (LOTENSIN) 5 MG tablet Take 5 mg by mouth daily.   Yes Historical Provider, MD  bimatoprost (LUMIGAN) 0.01 % SOLN Apply 1 drop to eye 2 (two) times daily.   Yes Historical Provider, MD  donepezil (ARICEPT) 10 MG tablet Take 10 mg by mouth at bedtime.   Yes Historical Provider, MD  glimepiride (AMARYL) 2 MG tablet Take 2 mg by mouth daily before breakfast.   Yes Historical Provider, MD  labetalol (NORMODYNE) 100 MG tablet Take 100 mg by mouth 2 (two) times daily.   Yes Historical Provider, MD  levETIRAcetam (KEPPRA) 500 MG tablet Take 500 mg by mouth 2 (two) times daily.   Yes Historical Provider, MD  Polyethyl Glycol-Propyl Glycol (SYSTANE ULTRA OP) Place 1 drop into both eyes daily as needed. For dry eyes   Yes Historical Provider, MD  pregabalin (LYRICA) 50 MG capsule Take 50 mg by mouth 3 (three) times daily.   Yes Historical Provider, MD  senna (SENOKOT) 8.6 MG TABS Take 1 tablet by mouth at bedtime.   Yes Historical Provider, MD  vancomycin (VANCOCIN) 50 mg/mL oral solution Take 2.5 mLs (125 mg total) by mouth every 6 (six) hours. For 11 more days following discharge. 07/13/12 07/24/12 Yes Costin Gherghe, MD  zolpidem (AMBIEN) 10 MG tablet Take 10 mg by mouth at bedtime as needed for sleep.   Yes Historical Provider, MD  Allergies  Allergen Reactions  . Codeine Other (See Comments)    hallucination  . Fentanyl Other (See Comments)    Abnormal behavior per husband  . Morphine Itching  . Penicillins Itching    FAMILY HISTORY:  Family History  Problem Relation Age of Onset  . Colon cancer Neg Hx   . Heart disease Father   . Heart disease Brother    SOCIAL HISTORY:  reports that she has quit smoking. She has never used smokeless tobacco. She reports that she does not drink alcohol or use illicit  drugs.  REVIEW OF SYSTEMS:   The patient is sedated on ventilation and not able to obtain review of system at this time  SUBJECTIVE:  The patient is hemodynamically stable and on mechanical ventilatory support   VITAL SIGNS: Temp:  [98.4 F (36.9 C)] 98.4 F (36.9 C) (02/23 0616) Pulse Rate:  [95-143] 106 (02/23 0715) Resp:  [18-22] 19 (02/23 0715) BP: (135-228)/(86-120) 193/106 mmHg (02/23 0715) SpO2:  [98 %-100 %] 100 % (02/23 0715) FiO2 (%):  [30 %] 30 % (02/23 0723) Weight:  [79.833 kg (176 lb)] 79.833 kg (176 lb) (02/23 1324) HEMODYNAMICS: Hemodynamics was stable to this time   VENTILATOR SETTINGS: Vent Mode:  [-] PRVC FiO2 (%):  [30 %] 30 % Set Rate:  [20 bmp] 20 bmp Vt Set:  [450 mL-520 mL] 450 mL PEEP:  [5 cmH20] 5 cmH20 Plateau Pressure:  [21 cmH20-22 cmH20] 22 cmH20 INTAKE / OUTPUT: Intake/Output   None     PHYSICAL EXAMINATION: General:  Sedated after American female in no acute distress nasally intubated Neuro:  Sedated on propofol drip RASS-3 HEENT:  Severe tongue swelling protruding out of the mouth and not able to open review the airway Cardiovascular:  Regular rate and rhythm normal S1-S2 no S3 no S4 no murmur rub heave or gallop Lungs:  Scattered rhonchi and distant breath sounds Abdomen:  Soft nontender bowel sounds are active no organomegaly no masses Musculoskeletal:  No joint deformity 2+ edema lower extremities Skin:  Clear  LABS:  Recent Labs Lab 07/13/12 0430 07/14/12 0440 07/15/12 0535  HGB 8.8* 8.6* 8.4*  WBC 6.5 6.4 5.8  PLT 259 240 251  NA 137 134* 136  K 4.2 4.4 4.7  CL 106 104 104  CO2 18* 19 19  GLUCOSE 186* 153* 138*  BUN 41* 36* 35*  CREATININE 1.87* 1.76* 1.57*  CALCIUM 8.5 8.6 8.9    Recent Labs Lab 07/14/12 1215 07/14/12 1650 07/14/12 2050 07/15/12 0751 07/15/12 1234  GLUCAP 168* 194* 190* 111* 126*    CXR: 07/18/2012: Endotracheal tube in satisfactory position basilar atelectasis  ASSESSMENT /  PLAN:  PULMONARY A: Acute hypoxic respiratory failure the basis of upper airway obstruction due to severe angioedema with tongue swelling due to ACE inhibitor use P:   The patient will need early tracheostomy Full vent support for now Sedated 2-3 to -2 rass  CARDIOVASCULAR A: Hypertension and history of congestive heart failure systolic P:  Give sedation and hold hypertensive meds for now Discontinue ACE inhibitor  RENAL A:  Acute renal failure hx now CKD stage 3 P:   Monitor  GASTROINTESTINAL A:  History of Clostridium difficile toxin colitis and enteritis P:   Continue vancomycin per NG tube  HEMATOLOGIC A:  No acute hematologic issues P:  Monitor  INFECTIOUS A:  Clostridium difficile colitis and enteritis P:   Monitor and continue vancomycin  ENDOCRINE A:  No acute endocrine issues except for diabetes  type 2   P:   ICU hyperglycemia protocol  NEUROLOGIC A:  History of stroke history of seizure disorder P:   Continue Keppra Transitioned of Precedex from propofol Note patient has multiple narcotic allergies may need to use Dilaudid for pain  TODAY'S SUMMARY: 79 year old African female with angioedema and acute respiratory failure now on full mechanical ventilatory support  this patient will need likely an early tracheostomy tube. Plan is to place in the ICU on full support.   I have personally obtained a history, examined the patient, evaluated laboratory and imaging results, formulated the assessment and plan and placed orders. CRITICAL CARE: The patient is critically ill with multiple organ systems failure and requires high complexity decision making for assessment and support, frequent evaluation and titration of therapies, application of advanced monitoring technologies and extensive interpretation of multiple databases. Critical Care Time devoted to patient care services described in this note is 45 minutes.   Dorcas Carrow Beeper  [phone]  Cell   [phone]  If no response or cell goes to voicemail, call beeper [phone]  Pulmonary and Critical Care Medicine [facility] Pager: [phone]  07/18/2012, 7:54 AM

## 2012-07-18 NOTE — ED Notes (Signed)
Pt receiving max oxygen via blow by

## 2012-07-18 NOTE — ED Notes (Signed)
Anesthesia by bedside

## 2012-07-18 NOTE — ED Notes (Signed)
EDP at bedside  

## 2012-07-18 NOTE — ED Notes (Signed)
Pt transferred to Trauma A for intubation

## 2012-07-19 ENCOUNTER — Inpatient Hospital Stay (HOSPITAL_COMMUNITY): Payer: Medicare Other

## 2012-07-19 DIAGNOSIS — G9340 Encephalopathy, unspecified: Secondary | ICD-10-CM

## 2012-07-19 DIAGNOSIS — Z5189 Encounter for other specified aftercare: Secondary | ICD-10-CM

## 2012-07-19 LAB — CBC
Platelets: 294 10*3/uL (ref 150–400)
RDW: 18.7 % — ABNORMAL HIGH (ref 11.5–15.5)
WBC: 6.2 10*3/uL (ref 4.0–10.5)

## 2012-07-19 LAB — GLUCOSE, CAPILLARY
Glucose-Capillary: 142 mg/dL — ABNORMAL HIGH (ref 70–99)
Glucose-Capillary: 142 mg/dL — ABNORMAL HIGH (ref 70–99)
Glucose-Capillary: 149 mg/dL — ABNORMAL HIGH (ref 70–99)
Glucose-Capillary: 149 mg/dL — ABNORMAL HIGH (ref 70–99)
Glucose-Capillary: 154 mg/dL — ABNORMAL HIGH (ref 70–99)
Glucose-Capillary: 173 mg/dL — ABNORMAL HIGH (ref 70–99)

## 2012-07-19 LAB — BASIC METABOLIC PANEL
Chloride: 107 mEq/L (ref 96–112)
GFR calc Af Amer: 47 mL/min — ABNORMAL LOW (ref >90)
Potassium: 4.1 mEq/L (ref 3.5–5.1)
Sodium: 141 mEq/L (ref 135–145)

## 2012-07-19 MED ORDER — INSULIN ASPART 100 UNIT/ML ~~LOC~~ SOLN
0.0000 [IU] | SUBCUTANEOUS | Status: DC
  Administered 2012-07-19: 3 [IU] via SUBCUTANEOUS
  Administered 2012-07-19: 4 [IU] via SUBCUTANEOUS
  Administered 2012-07-19: 3 [IU] via SUBCUTANEOUS
  Administered 2012-07-19: 100 [IU] via SUBCUTANEOUS
  Administered 2012-07-19: 3 [IU] via SUBCUTANEOUS
  Administered 2012-07-19 – 2012-07-20 (×3): 4 [IU] via SUBCUTANEOUS
  Administered 2012-07-20 (×3): 3 [IU] via SUBCUTANEOUS
  Administered 2012-07-21 (×5): 4 [IU] via SUBCUTANEOUS
  Administered 2012-07-21: 7 [IU] via SUBCUTANEOUS
  Administered 2012-07-22 (×2): 4 [IU] via SUBCUTANEOUS
  Administered 2012-07-22: 7 [IU] via SUBCUTANEOUS
  Administered 2012-07-22: 4 [IU] via SUBCUTANEOUS
  Administered 2012-07-22 – 2012-07-23 (×2): 3 [IU] via SUBCUTANEOUS
  Administered 2012-07-23: 4 [IU] via SUBCUTANEOUS
  Administered 2012-07-24: 3 [IU] via SUBCUTANEOUS
  Administered 2012-07-24 – 2012-07-25 (×3): 4 [IU] via SUBCUTANEOUS
  Administered 2012-07-25 (×2): 3 [IU] via SUBCUTANEOUS
  Administered 2012-07-25 (×2): 4 [IU] via SUBCUTANEOUS
  Administered 2012-07-26: 3 [IU] via SUBCUTANEOUS
  Administered 2012-07-26: 4 [IU] via SUBCUTANEOUS
  Administered 2012-07-26 (×2): 7 [IU] via SUBCUTANEOUS
  Administered 2012-07-26 – 2012-07-27 (×2): 3 [IU] via SUBCUTANEOUS

## 2012-07-19 NOTE — Progress Notes (Signed)
PULMONARY  / CRITICAL CARE MEDICINE  Name: Glenda Gonzalez MRN: [id number] DOB: [birth year]    ADMISSION DATE:  07/18/2012 REFERRING MD :  EDP  CHIEF COMPLAINT:  Angioedema  BRIEF PATIENT DESCRIPTION: 79 year old [name] just discharged from the hospital several days ago returns with acute angioedema likely due to ACE inhibitor use. The patient was intubated nasally in the emergency room by anesthesia and critical care is now admitting area past history was significant for coronary artery disease, dementia, congestive heart failure, reflux disease, hypertension, and recurrent C. difficile colitis HISTORY OF PRESENT ILLNESS:  78 rolled [name] just discharged from the hospital several days ago returns with acute angioedema due to ACE inhibitor use. The patient was intubated in the emergency department by anesthesia. The patient's husband states she's had acute onset of tongue swelling over last 24 hours. While in the emergency room the tongue is swelling continued to the point where the patient had to be intubated nasally. The patient's now on full mechanical ventilatory support and critical care is been asked to admit.   LINES / TUBES: Central line 07/18/2012 Nasal intubation 07/18/2012  CULTURES: Respiratory culture 07/18/2012 Results for orders placed during the hospital encounter of 07/18/12  MRSA PCR SCREENING     Status: None   Collection Time    07/18/12 11:17 AM      Result Value Range Status   MRSA by PCR NEGATIVE  NEGATIVE Final   Comment:            The GeneXpert MRSA Assay (FDA     approved for NASAL specimens     only), is one component of a     comprehensive MRSA colonization     surveillance program. It is not     intended to diagnose MRSA     infection nor to guide or     monitor treatment for     MRSA infections.  CULTURE, RESPIRATORY (NON-EXPECTORATED)     Status: None   Collection Time    07/18/12 12:13 PM      Result Value  Range Status   Specimen Description TRACHEAL ASPIRATE   Final   Special Requests Normal   Final   Gram Stain PENDING   Incomplete   Culture NO GROWTH   Final   Report Status PENDING   Incomplete      ANTIBIOTICS: IV flagyl Anti-infectives   Start     Dose/Rate Route Frequency Ordered Stop   07/18/12 1200  vancomycin (VANCOCIN) 50 mg/mL oral solution 125 mg  Status:  Discontinued     125 mg Oral 4 times per day 07/18/12 1114 07/18/12 1141   07/18/12 1200  metroNIDAZOLE (FLAGYL) IVPB 500 mg     500 mg 100 mL/hr over 60 Minutes Intravenous 4 times per day 07/18/12 1141          SIGNIFICANT EVENTS / STUDIES:  07/18/2012: Intubated nasally in the emergency room by anesthesia    SUBJECTIVE/OVERNIGHT/INTERVAL HX 07/19/12: Tongue still swollen. Cannot extubate  VITAL SIGNS: Temp:  [94.3 F (34.6 C)-98.3 F (36.8 C)] 98.2 F (36.8 C) (02/24 1300) Pulse Rate:  [42-96] 54 (02/24 1300) Resp:  [0-35] 17 (02/24 1300) BP: (83-199)/(45-86) 115/58 mmHg (02/24 1300) SpO2:  [98 %-100 %] 100 % (02/24 1300) FiO2 (%):  [30 %] 30 % (02/24 1128) Weight:  [83.5 kg (184 lb 1.4 oz)] 83.5 kg (184 lb 1.4 oz) (02/24 0500) HEMODYNAMICS: Hemodynamics was stable to this time   VENTILATOR SETTINGS: Vent  Mode:  [-] PRVC FiO2 (%):  [30 %] 30 % Set Rate:  [14 bmp] 14 bmp Vt Set:  [450 mL] 450 mL PEEP:  [5 cmH20] 5 cmH20 Plateau Pressure:  [11 cmH20-18 cmH20] 14 cmH20 INTAKE / OUTPUT: Intake/Output     02/23 0701 - 02/24 0700 02/24 0701 - 02/25 0700   I.V. (mL/kg) 1003.2 (12) 343.2 (4.1)   IV Piggyback 610 250   Total Intake(mL/kg) 1613.2 (19.3) 593.2 (7.1)   Urine (mL/kg/hr) 925 (0.5) 70 (0.1)   Total Output 925 70   Net +688.2 +523.2          PHYSICAL EXAMINATION: General:  Sedated after American female in no acute distress nasally intubated Neuro:  Sedated on propofol drip RASS-3 HEENT:  Severe tongue swelling protruding out of the mouth and not able to open review the  airway Cardiovascular:  Regular rate and rhythm normal S1-S2 no S3 no S4 no murmur rub heave or gallop Lungs:  Scattered rhonchi and distant breath sounds Abdomen:  Soft nontender bowel sounds are active no organomegaly no masses Musculoskeletal:  No joint deformity 2+ edema lower extremities Skin:  Clear  LABS:  Recent Labs Lab 07/15/12 0535 07/18/12 0725 07/18/12 0740 07/19/12 0408  HGB 8.4* 10.1*  --  8.8*  WBC 5.8 7.2  --  6.2  PLT 251 304  --  294  NA 136 138  --  141  K 4.7 3.5  --  4.1  CL 104 102  --  107  CO2 19 23  --  23  GLUCOSE 138* 165*  --  161*  BUN 35* 17  --  29*  CREATININE 1.57* 0.98  --  1.26*  CALCIUM 8.9 8.8  --  8.5  INR  --   --  1.03  --     Recent Labs Lab 07/19/12 0143 07/19/12 0241 07/19/12 0341 07/19/12 0754 07/19/12 1259  GLUCAP 129* 149* 155* 160* 149*    CXR: 07/18/2012: Endotracheal tube in satisfactory position basilar atelectasis  ASSESSMENT / PLAN:  PULMONARY A: Acute hypoxic respiratory failure the basis of upper airway obstruction due to severe angioedema with tongue swelling due to ACE inhibitor use.  - 07/18/2012: ENT service suggesting continued intubation and avoiding tracheostomy  - 07/19/2012: Does not meet extubation criteria due to swollen tongue  P:   Full vent support for now Sedated 2-3 to -2 rass  CARDIOVASCULAR No results found for this basename: PROBNP,  in the last 168 hours  No results found for this basename: TROPONINI,  in the last 168 hours  A: Hypertension and history of congestive heart failure systolic P:  Give sedation and hold hypertensive meds for now Discontinue ACE inhibitor  RENAL  Recent Labs Lab 07/13/12 0430 07/14/12 0440 07/15/12 0535 07/18/12 0725 07/19/12 0408  NA 137 134* 136 138 141  K 4.2 4.4 4.7 3.5 4.1  CL 106 104 104 102 107  CO2 18* 19 19 23 23   GLUCOSE 186* 153* 138* 165* 161*  BUN 41* 36* 35* 17 29*  CREATININE 1.87* 1.76* 1.57* 0.98 1.26*  CALCIUM 8.5 8.6 8.9  8.8 8.5    A:  Acute renal failure hx now CKD stage 3 P:   Monitor  GASTROINTESTINAL  Recent Labs Lab 07/18/12 0740  INR 1.03    A:  History of Clostridium difficile toxin colitis and enteritis P:   Continue IV Flagyl Await C. difficile PCR  HEMATOLOGIC  Recent Labs Lab 07/15/12 0535 07/18/12 0725 07/19/12 0408  HGB 8.4* 10.1* 8.8*  HCT 25.9* 30.0* 27.0*  WBC 5.8 7.2 6.2  PLT 251 304 294     A:  No acute hematologic issues P:  Monitor  INFECTIOUS No results found for this basename: LATICACIDVEN, PROCALCITON,  in the last 168 hours  A:  Clostridium difficile colitis and enteritis P:   Monitor and continue vancomycin  ENDOCRINE A:  No acute endocrine issues except for diabetes type 2   P:   ICU hyperglycemia protocol  NEUROLOGIC A:  History of stroke history of seizure disorder P:   Continue Keppra Sedation Note patient has multiple narcotic allergies may need to use Dilaudid for pain     I have personally obtained a history, examined the patient, evaluated laboratory and imaging results, formulated the assessment and plan and placed orders. CRITICAL CARE: The patient is critically ill with multiple organ systems failure and requires high complexity decision making for assessment and support, frequent evaluation and titration of therapies, application of advanced monitoring technologies and extensive interpretation of multiple databases. Critical Care Time devoted to patient care services described in this note is 31 minutes.       Dr. Kalman Shan, M.D., [facility].C.P Pulmonary and Critical Care Medicine Staff Physician [place] [facility]  Pulmonary and Critical Care Pager: [phone], If no answer or between  15:00h - 7:00h: call [phone]  07/19/2012 2:13 PM

## 2012-07-19 NOTE — Progress Notes (Signed)
eLink Physician-Brief Progress Note Patient Name: Glenda Gonzalez DOB: 29-Sep-1933 MRN: [id number]  Date of Service  07/19/2012   HPI/Events of Note  Hyperglycemia - had been on insulin gtt.  Now off several hours.  Blood sugar had increased with the addition of steroids.   eICU Interventions  Plan: Transitioned to SSI q4H resistant coverage.   Intervention Category Intermediate Interventions: Hyperglycemia - evaluation and treatment  Tiffanyann Deroo 07/19/2012, 3:59 AM

## 2012-07-19 NOTE — Progress Notes (Signed)
UR Completed.  Glenda Gonzalez Jane [phone] 07/19/2012  

## 2012-07-19 NOTE — Progress Notes (Addendum)
1600 attempted to place OGT/NGT per MD orders.  Resistance met, blood noted to mouth and left nostril.  Feeding tube not placed.  Dr. Bonney Aid notified.

## 2012-07-19 NOTE — Progress Notes (Addendum)
INITIAL NUTRITION ASSESSMENT  DOCUMENTATION CODES Per approved criteria  -Obesity Unspecified   INTERVENTION: 1.  Enteral nutrition; if access achieved, initiate Promote @ 10 mL/hr continuous with Prostat 6 times daily to provide 1220 kcal, 115g protein, 201 mL free water.  NUTRITION DIAGNOSIS: Inadequate oral intake related to angioedema as evidenced by pt intubated, NPO due to swelling.   Monitor:  1.  Enteral nutrition; initiation with tolerance. Enteral nutrition to provide 60-70% of estimated calorie needs (22-25 kcals/kg ideal body weight) and 100% of estimated protein needs, based on ASPEN guidelines for permissive underfeeding in critically ill obese individuals. 2.  Wt/wt change; monitor trends 3.  Food/Beverage; ability to consume POs if trach placed.  Reason for Assessment: consult  79 y.o. female  Admitting Dx: ACE inhibitor-aggravated angioedema  ASSESSMENT: Pt admitted with angioedema after recent discharge from hospital.  Pt with nasal intubation.   Patient is currently intubated on ventilator support.  MV: 8.0 L/min Temp:Temp (24hrs), Avg:97.6 F (36.4 C), Min:94.3 F (34.6 C), Max:98.3 F (36.8 C)  Propofol: 14.4 ml/hr providing 380 kcal/day  Pt with h/o of C. Diff colitis and enteritis- continues abx for same. Current plan is for early trach.    Pt followed by clinical nutrition staff at recent hospitalization and was noted to be improving in her nutrition status since transition home with husband.  Pt also tolerated nutrition support and PO diet during admission.  Wt recently stable.  No family at bedside during visit. Order noted for NGT placement, however no tube noted on exam.  RN attempted placement during visit and was able to achieve OGT or NGT.  Per MD, will re-attempt tomorrow.  Height: Ht Readings from Last 1 Encounters:  07/18/12 5\' 5"  (1.651 m)    Weight: Wt Readings from Last 1 Encounters:  07/19/12 184 lb 1.4 oz (83.5 kg)    Ideal Body  Weight: 125 lbs  % Ideal Body Weight: 147%  Wt Readings from Last 10 Encounters:  07/19/12 184 lb 1.4 oz (83.5 kg)  07/09/12 176 lb (79.833 kg)  06/23/12 176 lb 12.9 oz (80.2 kg)  06/16/12 179 lb 7.3 oz (81.4 kg)  03/07/12 146 lb (66.225 kg)  07/24/11 200 lb 13.4 oz (91.1 kg)  06/26/11 170 lb (77.111 kg)  06/17/11 204 lb (92.534 kg)  06/12/11 204 lb (92.534 kg)  05/06/11 204 lb (92.534 kg)    Usual Body Weight: ~200 lbs  % Usual Body Weight: 92%, edema noted  BMI:  Body mass index is 30.63 kg/(m^2).  Estimated Nutritional Needs: Kcal: 1420 Protein: 113-124g Fluid: ~2.0 L/day or per MD  Skin: non-pitting, generalized edema  Diet Order: NPO  EDUCATION NEEDS: -Education not appropriate at this time   Intake/Output Summary (Last 24 hours) at 07/19/12 1505 Last data filed at 07/19/12 1350  Gross per 24 hour  Intake 1675.7 ml  Output    595 ml  Net 1080.7 ml    Last BM: 2/23  Labs:   Recent Labs Lab 07/15/12 0535 07/18/12 0725 07/19/12 0408  NA 136 138 141  K 4.7 3.5 4.1  CL 104 102 107  CO2 19 23 23   BUN 35* 17 29*  CREATININE 1.57* 0.98 1.26*  CALCIUM 8.9 8.8 8.5  GLUCOSE 138* 165* 161*    CBG (last 3)   Recent Labs  07/19/12 0341 07/19/12 0754 07/19/12 1259  GLUCAP 155* 160* 149*    Scheduled Meds: . antiseptic oral rinse  15 mL Mouth Rinse QID  . chlorhexidine  15 mL Mouth Rinse BID  . diphenhydrAMINE  50 mg Intravenous Q6H  . famotidine (PEPCID) IV  20 mg Intravenous Q12H  . heparin  5,000 Units Subcutaneous Q8H  . insulin aspart  0-20 Units Subcutaneous Q4H  . levETIRAcetam  500 mg Intravenous Q12H  . methylPREDNISolone (SOLU-MEDROL) injection  80 mg Intravenous Q6H  . metronidazole  500 mg Intravenous Q6H    Continuous Infusions: . sodium chloride 10 mL/hr at 07/18/12 1821  . dexmedetomidine Stopped (07/18/12 1619)  . propofol 30 mcg/kg/min (07/19/12 9147)    Past Medical History  Diagnosis Date  . Diabetes mellitus   .  GERD (gastroesophageal reflux disease)   . Hypertension   . Diverticulitis   . Gout   . Essential and other specified forms of tremor   . Dementia   . CAD (coronary artery disease)   . CHF (congestive heart failure)     Diastolic dysfunction  . Stroke   . Seizures     r/t stroke    Past Surgical History  Procedure Laterality Date  . Dilation and curettage of uterus    . Tumor removal      Loyce Dys, MS RD LDN Clinical Inpatient Dietitian Pager: [phone] Weekend/After hours pager: [phone]

## 2012-07-19 NOTE — Progress Notes (Signed)
[facility]  Patient Status: Active (receiving services up to time of hospitalization)  [facility] is providing the following services: RN, PT, OT and ST -   If patient discharges after hours, please call [phone].   [facility] 07/19/2012, 11:22 AM

## 2012-07-20 LAB — CULTURE, RESPIRATORY W GRAM STAIN

## 2012-07-20 LAB — GLUCOSE, CAPILLARY: Glucose-Capillary: 157 mg/dL — ABNORMAL HIGH (ref 70–99)

## 2012-07-20 MED ORDER — DEXTROSE-NACL 5-0.45 % IV SOLN
INTRAVENOUS | Status: DC
  Administered 2012-07-20 – 2012-07-21 (×3): via INTRAVENOUS

## 2012-07-20 MED ORDER — HYDROMORPHONE HCL PF 1 MG/ML IJ SOLN
0.5000 mg | INTRAMUSCULAR | Status: DC | PRN
  Administered 2012-07-20 – 2012-07-21 (×3): 1 mg via INTRAVENOUS
  Filled 2012-07-20 (×3): qty 1

## 2012-07-20 MED ORDER — LABETALOL HCL 100 MG PO TABS
100.0000 mg | ORAL_TABLET | Freq: Two times a day (BID) | ORAL | Status: DC
  Administered 2012-07-20: 100 mg via ORAL
  Filled 2012-07-20 (×4): qty 1

## 2012-07-20 MED ORDER — HYDRALAZINE HCL 20 MG/ML IJ SOLN
10.0000 mg | INTRAMUSCULAR | Status: DC | PRN
  Administered 2012-07-20 – 2012-07-21 (×2): 10 mg via INTRAVENOUS
  Administered 2012-07-22: 20 mg via INTRAVENOUS
  Administered 2012-07-22 (×2): 10 mg via INTRAVENOUS
  Administered 2012-07-23: 40 mg via INTRAVENOUS
  Administered 2012-07-24: 10 mg via INTRAVENOUS
  Filled 2012-07-20: qty 1
  Filled 2012-07-20: qty 2
  Filled 2012-07-20 (×2): qty 1
  Filled 2012-07-20: qty 2
  Filled 2012-07-20 (×2): qty 1

## 2012-07-20 NOTE — Progress Notes (Signed)
PULMONARY  / CRITICAL CARE MEDICINE  Name: Glenda Gonzalez MRN: [id number] DOB: 1933-10-11    ADMISSION DATE:  07/18/2012 REFERRING MD :  EDP  CHIEF COMPLAINT:  Angioedema  BRIEF PATIENT DESCRIPTION: 79 year old [name] just discharged from the hospital several days ago returns with acute angioedema likely due to ACE inhibitor use. The patient was intubated nasally in the emergency room by anesthesia and critical care is now admitting area past history was significant for coronary artery disease, dementia, congestive heart failure, reflux disease, hypertension, and recurrent C. difficile colitis   LINES / TUBES: Central line 07/18/2012 Nasal intubation 07/18/2012  CULTURES: Respiratory culture 07/18/2012 Results for orders placed during the hospital encounter of 07/18/12  MRSA PCR SCREENING     Status: None   Collection Time    07/18/12 11:17 AM      Result Value Range Status   MRSA by PCR NEGATIVE  NEGATIVE Final   Comment:            The GeneXpert MRSA Assay (FDA     approved for NASAL specimens     only), is one component of a     comprehensive MRSA colonization     surveillance program. It is not     intended to diagnose MRSA     infection nor to guide or     monitor treatment for     MRSA infections.  CULTURE, RESPIRATORY (NON-EXPECTORATED)     Status: None   Collection Time    07/18/12 12:13 PM      Result Value Range Status   Specimen Description TRACHEAL ASPIRATE   Final   Special Requests Normal   Final   Gram Stain     Final   Value: FEW WBC PRESENT,BOTH PMN AND MONONUCLEAR     RARE SQUAMOUS EPITHELIAL CELLS PRESENT     NO ORGANISMS SEEN   Culture Non-Pathogenic Oropharyngeal-type Flora Isolated.   Final   Report Status 07/20/2012 FINAL   Final  CLOSTRIDIUM DIFFICILE BY PCR     Status: None   Collection Time    07/19/12  6:48 PM      Result Value Range Status   C difficile by pcr NEGATIVE  NEGATIVE Final       ANTIBIOTICS:  Anti-infectives   Start     Dose/Rate Route Frequency Ordered Stop   07/18/12 1200  vancomycin (VANCOCIN) 50 mg/mL oral solution 125 mg  Status:  Discontinued     125 mg Oral 4 times per day 07/18/12 1114 07/18/12 1141   07/18/12 1200  metroNIDAZOLE (FLAGYL) IVPB 500 mg     500 mg 100 mL/hr over 60 Minutes Intravenous 4 times per day 07/18/12 1141          SIGNIFICANT EVENTS / STUDIES:  07/18/2012: Intubated nasally in the emergency room by anesthesia 07/19/12: Tongue still swollen. Cannot extubate     SUBJECTIVE/OVERNIGHT/INTERVAL HX 07/20/2012: Improved swelling of the tongue but unable to extubate due to lack of cuff leak and agitation on wake up assessment nurse expressing concern about high blood pressures.    VITAL SIGNS: Temp:  [97.5 F (36.4 C)-98.4 F (36.9 C)] 97.7 F (36.5 C) (02/25 1130) Pulse Rate:  [51-82] 51 (02/25 1130) Resp:  [13-19] 15 (02/25 1130) BP: (115-201)/(50-88) 166/67 mmHg (02/25 1130) SpO2:  [100 %] 100 % (02/25 1130) FiO2 (%):  [30 %] 30 % (02/25 1126) Weight:  [84 kg (185 lb 3 oz)] 84 kg (185 lb 3 oz) (02/25 0400)  VENTILATOR SETTINGS: Vent Mode:  [-] PRVC FiO2 (%):  [30 %] 30 % Set Rate:  [14 bmp] 14 bmp Vt Set:  [450 mL] 450 mL PEEP:  [5 cmH20] 5 cmH20 Plateau Pressure:  [12 cmH20-17 cmH20] 12 cmH20 INTAKE / OUTPUT: Intake/Output     02/24 0701 - 02/25 0700 02/25 0701 - 02/26 0700   I.V. (mL/kg) 1437.4 (17.1) 245.2 (2.9)   IV Piggyback 705 50   Total Intake(mL/kg) 2142.4 (25.5) 295.2 (3.5)   Urine (mL/kg/hr) 430 (0.2) 150 (0.4)   Total Output 430 150   Net +1712.4 +145.2          PHYSICAL EXAMINATION: General:  Sedated after American female in no acute distress nasally intubated Neuro:  Sedated on propofol drip RASS-3 HEENT:  Severe tongue swelling protruding out of the mouth and not able to open review the airway - appears improved on 07/20/2012 Cardiovascular:  Regular rate and rhythm normal S1-S2 no  S3 no S4 no murmur rub heave or gallop Lungs:  Scattered rhonchi and distant breath sounds Abdomen:  Soft nontender bowel sounds are active no organomegaly no masses Musculoskeletal:  No joint deformity 2+ edema lower extremities Skin:  Clear  LABS:  Recent Labs Lab 07/15/12 0535 07/18/12 0725 07/18/12 0740 07/19/12 0408  HGB 8.4* 10.1*  --  8.8*  WBC 5.8 7.2  --  6.2  PLT 251 304  --  294  NA 136 138  --  141  K 4.7 3.5  --  4.1  CL 104 102  --  107  CO2 19 23  --  23  GLUCOSE 138* 165*  --  161*  BUN 35* 17  --  29*  CREATININE 1.57* 0.98  --  1.26*  CALCIUM 8.9 8.8  --  8.5  INR  --   --  1.03  --     Recent Labs Lab 07/19/12 1511 07/19/12 1905 07/19/12 2337 07/20/12 0420 07/20/12 0751  GLUCAP 142* 142* 149* 137* 129*    CXR: 07/18/2012: Endotracheal tube in satisfactory position basilar atelectasis  ASSESSMENT / PLAN:  PULMONARY A: Acute hypoxic respiratory failure the basis of upper airway obstruction due to severe angioedema with tongue swelling due to ACE inhibitor use.  - 07/18/2012: ENT service suggesting continued intubation and avoiding tracheostomy  - 07/20/2012: Does not meet extubation criteria due to swollen tongue  P:   Full vent support for now Sedated 2-3 to -2 rass  CARDIOVASCULAR No results found for this basename: PROBNP,  in the last 168 hours  No results found for this basename: TROPONINI,  in the last 168 hours  A: Hypertension and history of congestive heart failure systolic P:  Give sedation and hold hypertensive meds for now Discontinue ACE inhibitor IV hydralazine when necessary and restart home labetalol once NG tube was placed  RENAL  Recent Labs Lab 07/14/12 0440 07/15/12 0535 07/18/12 0725 07/19/12 0408  NA 134* 136 138 141  K 4.4 4.7 3.5 4.1  CL 104 104 102 107  CO2 19 19 23 23   GLUCOSE 153* 138* 165* 161*  BUN 36* 35* 17 29*  CREATININE 1.76* 1.57* 0.98 1.26*  CALCIUM 8.6 8.9 8.8 8.5    A:  Acute renal  failure hx now CKD stage 3 P:   Monitor  GASTROINTESTINAL  Recent Labs Lab 07/18/12 0740  INR 1.03    A:  History of Clostridium difficile toxin colitis and enteritis - 07/20/2012: As of yesterday unable to pass NG tube do  to angioedema P:   - Reattempt placing NG tube   HEMATOLOGIC  Recent Labs Lab 07/15/12 0535 07/18/12 0725 07/19/12 0408  HGB 8.4* 10.1* 8.8*  HCT 25.9* 30.0* 27.0*  WBC 5.8 7.2 6.2  PLT 251 304 294     A:  No acute hematologic issues P:  Monitor  INFECTIOUS No results found for this basename: LATICACIDVEN, PROCALCITON,  in the last 168 hours  A:  Clostridium difficile colitis and enteritis - 07/15/2012: C. difficile PCR is negative P:   Discuss with pharmacy and they will dose stop date for current C. difficile treatment   ENDOCRINE A:  No acute endocrine issues except for diabetes type 2   P:   ICU hyperglycemia protocol  NEUROLOGIC A:  History of stroke history of seizure disorder P:   Continue Keppra Sedation Note patient has multiple narcotic allergies may need to use Dilaudid for pain  Global 07/20/2012: No family at bedside    I have personally obtained a history, examined the patient, evaluated laboratory and imaging results, formulated the assessment and plan and placed orders. CRITICAL CARE: The patient is critically ill with multiple organ systems failure and requires high complexity decision making for assessment and support, frequent evaluation and titration of therapies, application of advanced monitoring technologies and extensive interpretation of multiple databases. Critical Care Time devoted to patient care services described in this note is 31 minutes.       Dr. Kalman Shan, M.D., [facility].C.P Pulmonary and Critical Care Medicine Staff Physician [facility] [place] Pulmonary and Critical Care Pager: [phone], If no answer or between  15:00h - 7:00h: call [phone]  07/20/2012 11:45  AM

## 2012-07-21 LAB — RENAL FUNCTION PANEL
Albumin: 2.8 g/dL — ABNORMAL LOW (ref 3.5–5.2)
BUN: 51 mg/dL — ABNORMAL HIGH (ref 6–23)
GFR calc Af Amer: 30 mL/min — ABNORMAL LOW (ref >90)
Phosphorus: 5.3 mg/dL — ABNORMAL HIGH (ref 2.3–4.6)
Potassium: 3.8 mEq/L (ref 3.5–5.1)
Sodium: 141 mEq/L (ref 135–145)

## 2012-07-21 LAB — GLUCOSE, CAPILLARY
Glucose-Capillary: 161 mg/dL — ABNORMAL HIGH (ref 70–99)
Glucose-Capillary: 177 mg/dL — ABNORMAL HIGH (ref 70–99)

## 2012-07-21 LAB — MAGNESIUM: Magnesium: 2 mg/dL (ref 1.5–2.5)

## 2012-07-21 LAB — TRIGLYCERIDES: Triglycerides: 128 mg/dL (ref <150)

## 2012-07-21 LAB — CBC
Platelets: 257 10*3/uL (ref 150–400)
RDW: 19.4 % — ABNORMAL HIGH (ref 11.5–15.5)
WBC: 8.1 10*3/uL (ref 4.0–10.5)

## 2012-07-21 LAB — BASIC METABOLIC PANEL
BUN: 52 mg/dL — ABNORMAL HIGH (ref 6–23)
Calcium: 7.5 mg/dL — ABNORMAL LOW (ref 8.4–10.5)
GFR calc non Af Amer: 27 mL/min — ABNORMAL LOW (ref >90)
Glucose, Bld: 172 mg/dL — ABNORMAL HIGH (ref 70–99)
Sodium: 142 mEq/L (ref 135–145)

## 2012-07-21 MED ORDER — DEXMEDETOMIDINE HCL IN NACL 200 MCG/50ML IV SOLN
0.2000 ug/kg/h | INTRAVENOUS | Status: DC
  Administered 2012-07-21: 0.4 ug/kg/h via INTRAVENOUS
  Administered 2012-07-22: 0.2 ug/kg/h via INTRAVENOUS
  Administered 2012-07-22: 0.4 ug/kg/h via INTRAVENOUS
  Filled 2012-07-21 (×4): qty 50

## 2012-07-21 MED ORDER — HYDROMORPHONE HCL PF 1 MG/ML IJ SOLN
0.5000 mg | INTRAMUSCULAR | Status: DC | PRN
  Administered 2012-07-21 – 2012-07-23 (×2): 1 mg via INTRAVENOUS
  Administered 2012-07-23: 0.5 mg via INTRAVENOUS
  Administered 2012-07-23 – 2012-07-24 (×2): 1 mg via INTRAVENOUS
  Filled 2012-07-21 (×7): qty 1

## 2012-07-21 MED ORDER — METHYLPREDNISOLONE SODIUM SUCC 125 MG IJ SOLR
60.0000 mg | Freq: Three times a day (TID) | INTRAMUSCULAR | Status: DC
  Administered 2012-07-21 – 2012-07-22 (×2): 60 mg via INTRAVENOUS
  Filled 2012-07-21 (×5): qty 0.96

## 2012-07-21 MED ORDER — METRONIDAZOLE 500 MG PO TABS
500.0000 mg | ORAL_TABLET | Freq: Three times a day (TID) | ORAL | Status: DC
  Administered 2012-07-21 – 2012-07-27 (×18): 500 mg via ORAL
  Filled 2012-07-21 (×26): qty 1

## 2012-07-21 MED ORDER — PRO-STAT SUGAR FREE PO LIQD
60.0000 mL | Freq: Three times a day (TID) | ORAL | Status: DC
  Administered 2012-07-21 – 2012-07-22 (×3): 60 mL
  Filled 2012-07-21 (×6): qty 60

## 2012-07-21 MED ORDER — SODIUM CHLORIDE 0.9 % IV BOLUS (SEPSIS)
1000.0000 mL | Freq: Once | INTRAVENOUS | Status: AC
  Administered 2012-07-21: 1000 mL via INTRAVENOUS

## 2012-07-21 MED ORDER — SODIUM CHLORIDE 0.9 % IV BOLUS (SEPSIS)
500.0000 mL | Freq: Once | INTRAVENOUS | Status: AC
  Administered 2012-07-21: 500 mL via INTRAVENOUS

## 2012-07-21 MED ORDER — SODIUM CHLORIDE 0.9 % IV SOLN
INTRAVENOUS | Status: DC
  Administered 2012-07-21: 20:00:00 via INTRAVENOUS

## 2012-07-21 MED ORDER — JEVITY 1.2 CAL PO LIQD
1000.0000 mL | ORAL | Status: DC
  Administered 2012-07-21: 17:00:00
  Filled 2012-07-21 (×5): qty 1000

## 2012-07-21 MED ORDER — ADULT MULTIVITAMIN LIQUID CH
5.0000 mL | Freq: Every day | ORAL | Status: DC
  Administered 2012-07-21: 5 mL
  Filled 2012-07-21 (×3): qty 5

## 2012-07-21 NOTE — Progress Notes (Signed)
NUTRITION FOLLOW UP / CONSULT  Intervention:    Start TF via NG tube with Jevity 1.2 at 15 ml/h, increase by 10 ml every 4 hours to goal rate of 25 ml/h with Prostat 60 ml TID to provide 1320 kcals, 123 gm protein, 486 ml free water daily.  TF plus Propofol will provide a total of 1573 kcals/day (105% of estimated needs).  Liquid MVI daily to help meet 100% of DRI's.  Nutrition Dx:   Inadequate oral intake related to angioedema as evidenced by pt intubated, NPO due to swelling; ongoing.  Goal:   Intake to meet >90% of estimated nutrition needs, unmet.  Monitor:   Vent status, TF tolerance/adequacy, labs, weight trend.  Assessment:   Patient remains intubated nasally on ventilator support.  MV: 7.5 Temp:Temp (24hrs), Avg:97.5 F (36.4 C), Min:96.5 F (35.8 C), Max:98.2 F (36.8 C)  Propofol: 9.6 ml/hr providing 253 kcals daily.  Patient was discussed this morning in ICU rounds.  Received consult for TF initiation and management.   Height: Ht Readings from Last 1 Encounters:  07/18/12 5\' 5"  (1.651 m)    Weight Status:   Wt Readings from Last 1 Encounters:  07/20/12 185 lb 3 oz (84 kg)  07/19/12 184 lb 1.4 oz (83.5 kg)   BMI=29.3 (using usual weight)  Do not feel that patient would benefit from underfeeding guidelines at this time since BMI using usual weight is below 30.  Current weight is above usual weight due to edema/fluid overload.  Re-estimated needs:  Kcal: 1500 Protein: 110-125 gm Fluid: 2 L  Skin: edema  Diet Order: NPO   Intake/Output Summary (Last 24 hours) at 07/21/12 1109 Last data filed at 07/21/12 1000  Gross per 24 hour  Intake 2078.88 ml  Output    760 ml  Net 1318.88 ml    Last BM: 2/23   Labs:   Recent Labs Lab 07/18/12 0725 07/19/12 0408 07/21/12 0500  NA 138 141 141  K 3.5 4.1 3.8  CL 102 107 108  CO2 23 23 18*  BUN 17 29* 51*  CREATININE 0.98 1.26* 1.80*  CALCIUM 8.8 8.5 7.9*  MG  --   --  2.0  PHOS  --   --  5.3*   GLUCOSE 165* 161* 209*    CBG (last 3)   Recent Labs  07/21/12 07/21/12 0407 07/21/12 0708  GLUCAP 177* 184* 200*    Scheduled Meds: . antiseptic oral rinse  15 mL Mouth Rinse QID  . chlorhexidine  15 mL Mouth Rinse BID  . diphenhydrAMINE  50 mg Intravenous Q6H  . famotidine (PEPCID) IV  20 mg Intravenous Q12H  . heparin  5,000 Units Subcutaneous Q8H  . insulin aspart  0-20 Units Subcutaneous Q4H  . labetalol  100 mg Oral BID  . levETIRAcetam  500 mg Intravenous Q12H  . methylPREDNISolone (SOLU-MEDROL) injection  80 mg Intravenous Q6H  . metronidazole  500 mg Intravenous Q6H  . sodium chloride  1,000 mL Intravenous Once    Continuous Infusions: . dextrose 5 % and 0.45% NaCl 50 mL/hr at 07/21/12 0849  . propofol 20 mcg/kg/min (07/21/12 1000)     Joaquin Courts, RD, LDN, CNSC Pager# [phone] After Hours Pager# [phone]

## 2012-07-21 NOTE — Progress Notes (Addendum)
PULMONARY  / CRITICAL CARE MEDICINE  Name: Glenda Gonzalez MRN: [id number] DOB: 19-Apr-1934    ADMISSION DATE:  07/18/2012 REFERRING MD :  EDP  CHIEF COMPLAINT:  Angioedema  BRIEF PATIENT DESCRIPTION: 79 year old [name] just discharged from the hospital several days ago returns with acute angioedema likely due to ACE inhibitor use. The patient was intubated nasally in the emergency room by anesthesia and critical care is now admitting area past history was significant for coronary artery disease, dementia, congestive heart failure, reflux disease, hypertension, and recurrent C. difficile colitis   LINES / TUBES: Central line 07/18/2012 Nasal intubation 07/18/2012 >> (plan for oral exchange 07/22/12, if clinical situation persists) OG tube 2/26 >>  CULTURES: Respiratory culture 07/18/2012 - normal flora 2/23 MRSA PCR - neg 07/20/11 C Diff pcr - neg      ANTIBIOTICS:  Anti-infectives   Start     Dose/Rate Route Frequency Ordered Stop   07/18/12 1200  vancomycin (VANCOCIN) 50 mg/mL oral solution 125 mg  Status:  Discontinued     125 mg Oral 4 times per day 07/18/12 1114 07/18/12 1141   07/18/12 1200  metroNIDAZOLE (FLAGYL) IVPB 500 mg     500 mg 100 mL/hr over 60 Minutes Intravenous 4 times per day 07/18/12 1141 07/31/12 1200        SIGNIFICANT EVENTS / STUDIES:  07/18/2012: Intubated nasally in the emergency room by anesthesia 07/19/12: Tongue still swollen. Cannot extubate  07/20/2012: Improved swelling of the tongue but unable to extubate due to lack of cuff leak and agitation on wake up assessment nurse expressing concern about high blood pressures.     SUBJECTIVE/OVERNIGHT/INTERVAL HX 2/276- now developing hypoactive delirium. Also sinus brady and mild rise in creat  VITAL SIGNS: Temp:  [96.5 F (35.8 C)-98.2 F (36.8 C)] 96.7 F (35.9 C) (02/26 1100) Pulse Rate:  [44-81] 44 (02/26 1100) Resp:  [13-23] 16 (02/26 1100) BP: (94-174)/(41-87)  150/63 mmHg (02/26 1100) SpO2:  [98 %-100 %] 100 % (02/26 1100) FiO2 (%):  [29.8 %-30.4 %] 30.1 % (02/26 1119)   VENTILATOR SETTINGS: Vent Mode:  [-] PRVC FiO2 (%):  [29.8 %-30.4 %] 30.1 % Set Rate:  [14 bmp] 14 bmp Vt Set:  [450 mL] 450 mL PEEP:  [5 cmH20] 5 cmH20 Plateau Pressure:  [13 cmH20-18 cmH20] 17 cmH20 INTAKE / OUTPUT: Intake/Output     02/25 0701 - 02/26 0700 02/26 0701 - 02/27 0700   I.V. (mL/kg) 1380.1 (16.4) 283.6 (3.4)   NG/GT 80 30   IV Piggyback 610 50   Total Intake(mL/kg) 2070.1 (24.6) 363.6 (4.3)   Urine (mL/kg/hr) 775 (0.4) 135 (0.3)   Total Output 775 135   Net +1295.1 +228.6          PHYSICAL EXAMINATION: General:  Sedated after American female in no acute distress nasally intubated Neuro:  OFf diprivan RASS -2. Opens eyes to voice. PEERL +. Moves all 4s  HEENT:  Severe tongue swelling  Now in mouth and not able to open review the airway - appears improved on 07/21/2012 Cardiovascular:  Regular rate and rhythm normal S1-S2 no S3 no S4 no murmur rub heave or gallop Lungs:  Breath stacking on SBT Abdomen:  Soft nontender bowel sounds are active no organomegaly no masses Musculoskeletal:  No joint deformity 2+ edema lower extremities Skin:  Clear  IMAGING x 48h No results found.   ASSESSMENT / PLAN:  PULMONARY A: Acute hypoxic respiratory failure the basis of upper airway obstruction due to  severe angioedema with tongue swelling due to ACE inhibitor use.  - 07/18/2012: ENT service suggesting continued intubation and avoiding tracheostomy - 07/21/13:  Does not meet SBT due to delirium.  Does not meet extubation criteria due to angioedema though improving  P:   Full vent support Change sedation (see neuro) Continue solumedrol but reduce dose  CARDIOVASCULAR No results found for this basename: PROBNP,  in the last 168 hours  No results found for this basename: TROPONINI,  in the last 168 hours  A: Hypertension and history of congestive heart  failure systolic - 2/26: sinus brady P:  DC labetalol Check bnp No more  ACE inhibitor IV hydralazine when necessary    RENAL  Recent Labs Lab 07/15/12 0535 07/18/12 0725 07/19/12 0408 07/21/12 0500  NA 136 138 141 141  K 4.7 3.5 4.1 3.8  CL 104 102 107 108  CO2 19 23 23  18*  GLUCOSE 138* 165* 161* 209*  BUN 35* 17 29* 51*  CREATININE 1.57* 0.98 1.26* 1.80*  CALCIUM 8.9 8.8 8.5 7.9*  MG  --   --   --  2.0  PHOS  --   --   --  5.3*    A:  Acute renal failure hx now CKD stage 3 2/26: getting worse P:   FLuid bolus and recheck   GASTROINTESTINAL  Recent Labs Lab 07/18/12 0740 07/21/12 0500  ALBUMIN  --  2.8*  INR 1.03  --     A:  History of Clostridium difficile toxin colitis and enteritis - 07/21/2012: NG tube placed P:   - start tube feeds  HEMATOLOGIC  Recent Labs Lab 07/18/12 0725 07/19/12 0408 07/21/12 0500  HGB 10.1* 8.8* 8.3*  HCT 30.0* 27.0* 25.3*  WBC 7.2 6.2 8.1  PLT 304 294 257     A:  No acute hematologic issues P:  Monitor  INFECTIOUS No results found for this basename: LATICACIDVEN, PROCALCITON,  in the last 168 hours  A:  Clostridium difficile colitis and enteritis at prior admission Feb 2014 - 07/15/2012: C. difficile PCR is negative P:   Po flagyl through 07/31/12 to complete 2 weeks   ENDOCRINE A:  No acute endocrine issues except for diabetes type 2   P:   ICU hyperglycemia protocol  NEUROLOGIC A:  History of stroke history of seizure disorder 07/21/12: Hypoactive delirium P:   DC benardyl Continue Keppra Sedation - change diporivan to precedex Reduce dilaudid (Note patient has multiple narcotic allergies mbut these do not appear to be true allergies)   Global 07/20/2012 and 07/21/12: No family at bedside   CRITICAL CARE: The patient is critically ill with multiple organ systems failure and requires high complexity decision making for assessment and support, frequent evaluation and titration of therapies,  application of advanced monitoring technologies and extensive interpretation of multiple databases. Critical Care Time devoted to patient care services described in this note is 31 minutes.       Dr. Kalman Shan, M.D., [facility].C.P Pulmonary and Critical Care Medicine Staff Physician [facility] Pager: [phone], If no answer or between  15:00h - 7:00h: call [phone]  07/21/2012 12:17 PM

## 2012-07-22 ENCOUNTER — Inpatient Hospital Stay (HOSPITAL_COMMUNITY): Payer: Medicare Other

## 2012-07-22 DIAGNOSIS — E876 Hypokalemia: Secondary | ICD-10-CM

## 2012-07-22 DIAGNOSIS — E119 Type 2 diabetes mellitus without complications: Secondary | ICD-10-CM

## 2012-07-22 LAB — BASIC METABOLIC PANEL
BUN: 55 mg/dL — ABNORMAL HIGH (ref 6–23)
Chloride: 114 mEq/L — ABNORMAL HIGH (ref 96–112)
Glucose, Bld: 197 mg/dL — ABNORMAL HIGH (ref 70–99)
Potassium: 3.2 mEq/L — ABNORMAL LOW (ref 3.5–5.1)

## 2012-07-22 LAB — GLUCOSE, CAPILLARY
Glucose-Capillary: 174 mg/dL — ABNORMAL HIGH (ref 70–99)
Glucose-Capillary: 185 mg/dL — ABNORMAL HIGH (ref 70–99)
Glucose-Capillary: 143 mg/dL — ABNORMAL HIGH (ref 70–99)

## 2012-07-22 LAB — CBC WITH DIFFERENTIAL/PLATELET
Basophils Relative: 0 % (ref 0–1)
Eosinophils Absolute: 0 10*3/uL (ref 0.0–0.7)
HCT: 25.6 % — ABNORMAL LOW (ref 36.0–46.0)
Hemoglobin: 8.3 g/dL — ABNORMAL LOW (ref 12.0–15.0)
MCH: 27 pg (ref 26.0–34.0)
MCHC: 32.4 g/dL (ref 30.0–36.0)
Monocytes Absolute: 0.4 10*3/uL (ref 0.1–1.0)
Monocytes Relative: 5 % (ref 3–12)

## 2012-07-22 MED ORDER — POTASSIUM CHLORIDE 2 MEQ/ML IV SOLN
INTRAVENOUS | Status: DC
  Administered 2012-07-22: 12:00:00 via INTRAVENOUS
  Filled 2012-07-22 (×3): qty 1000

## 2012-07-22 MED ORDER — CHLORHEXIDINE GLUCONATE 0.12 % MT SOLN: 15.0000 mL | Freq: Two times a day (BID) | OROMUCOSAL | Status: DC

## 2012-07-22 MED ORDER — POTASSIUM CHLORIDE 10 MEQ/50ML IV SOLN
10.0000 meq | INTRAVENOUS | Status: AC
  Administered 2012-07-22 (×2): 10 meq via INTRAVENOUS
  Filled 2012-07-22: qty 100

## 2012-07-22 MED ORDER — BIOTENE DRY MOUTH MT LIQD: 15.0000 mL | Freq: Four times a day (QID) | OROMUCOSAL | Status: DC

## 2012-07-22 NOTE — Progress Notes (Signed)
PULMONARY  / CRITICAL CARE MEDICINE  Name: Glenda Gonzalez MRN: [id number] DOB: 02/19/1934    ADMISSION DATE:  07/18/2012 REFERRING MD :  EDP  CHIEF COMPLAINT:  Angioedema  BRIEF PATIENT DESCRIPTION: 79 year old [name] just discharged from the hospital several days ago returns with acute angioedema likely due to ACE inhibitor use. The patient was intubated nasally in the emergency room by anesthesia and critical care is now admitting area past history was significant for coronary artery disease, dementia, congestive heart failure, reflux disease, hypertension, and recurrent C. difficile colitis   LINES / TUBES: L IJ CVL 02/23 >>  Nasal intubation 07/18/2012 >> 2/27  CULTURES: C diff 2/15 >> POSITIVE Respiratory culture 07/18/2012 - normal flora 2/23 MRSA PCR - neg 07/20/11 C Diff pcr - neg    ANTIBIOTICS:  Anti-infectives   Start     Dose/Rate Route Frequency Ordered Stop   07/21/12 1400  metroNIDAZOLE (FLAGYL) tablet 500 mg     500 mg Oral 3 times per day 07/21/12 1235     07/18/12 1200  vancomycin (VANCOCIN) 50 mg/mL oral solution 125 mg  Status:  Discontinued     125 mg Oral 4 times per day 07/18/12 1114 07/18/12 1141   07/18/12 1200  metroNIDAZOLE (FLAGYL) IVPB 500 mg  Status:  Discontinued     500 mg 100 mL/hr over 60 Minutes Intravenous 4 times per day 07/18/12 1141 07/21/12 1235        SIGNIFICANT EVENTS / STUDIES:  07/18/2012: Intubated nasally in the emergency room by anesthesia 07/19/12: Tongue still swollen. Cannot extubate 07/20/2012: Improved swelling of the tongue but unable to extubate due to lack of cuff leak and agitation on wake up assessment  2/27 Extubated. Tolerating well     SUBJECTIVE/OVERNIGHT/INTERVAL HX Passed SBT. CAM-ICU negative. + F/C. Extubated and looks good  VITAL SIGNS: Temp:  [96.2 F (35.7 C)-99.6 F (37.6 C)] 98.1 F (36.7 C) (02/27 1200) Pulse Rate:  [47-66] 66 (02/27 1200) Resp:  [11-25] 18 (02/27  1200) BP: (118-184)/(53-82) 181/72 mmHg (02/27 1200) SpO2:  [97 %-100 %] 99 % (02/27 1200) FiO2 (%):  [29.8 %-30.3 %] 29.9 % (02/27 1000) Weight:  [88.1 kg (194 lb 3.6 oz)] 88.1 kg (194 lb 3.6 oz) (02/27 0500)   VENTILATOR SETTINGS: Vent Mode:  [-] CPAP FiO2 (%):  [29.8 %-30.3 %] 29.9 % Set Rate:  [14 bmp] 14 bmp Vt Set:  [450 mL] 450 mL PEEP:  [5 cmH20] 5 cmH20 Pressure Support:  [5 cmH20] 5 cmH20 Plateau Pressure:  [11 cmH20-16 cmH20] 14 cmH20 INTAKE / OUTPUT: Intake/Output     02/26 0701 - 02/27 0700 02/27 0701 - 02/28 0700   I.V. (mL/kg) 1239.5 (14.1) 86.8 (1)   NG/GT 855 75   IV Piggyback 560    Total Intake(mL/kg) 2654.5 (30.1) 161.8 (1.8)   Urine (mL/kg/hr) 1350 (0.6) 475 (1)   Stool  200 (0.4)   Total Output 1350 675   Net +1304.5 -513.2          PHYSICAL EXAMINATION: General:  RASS 0. + F/C Neuro:  No focal deficits  HEENT:  WNL. No tongue swelling noted Cardiovascular:  RRR s M Lungs: Clear Abdomen: Soft, NABS Ext: warm, no edema   CXR: RLL atx vs infiltrate    ASSESSMENT / PLAN:  PULMONARY A: Acute respiratory failure due to upper airway obstruction/severe angioedema/tongue swelling   Resolved 2/27  P:   Monitor in ICU post extubation   CARDIOVASCULAR  Recent Labs Lab  07/22/12 0410  PROBNP 979.7*    No results found for this basename: TROPONINI,  in the last 168 hours  A: Hypertension History of systolic heart failure  sinus brady, resolved P:  Cont PRN hydralazine   RENAL  Recent Labs Lab 07/18/12 0725 07/19/12 0408 07/21/12 0500 07/21/12 1300 07/22/12 0410  NA 138 141 141 142 144  K 3.5 4.1 3.8 4.0 3.2*  CL 102 107 108 111 114*  CO2 23 23 18* 18* 16*  GLUCOSE 165* 161* 209* 172* 197*  BUN 17 29* 51* 52* 55*  CREATININE 0.98 1.26* 1.80* 1.75* 1.59*  CALCIUM 8.8 8.5 7.9* 7.5* 7.6*  MG  --   --  2.0  --  1.9  PHOS  --   --  5.3*  --  4.0    A:  Acute renal insuff - Cr improving Hypernatremia Hyperkalemia  P:    Monitor BMET D5W + KCl ordered 2/27   GASTROINTESTINAL  Recent Labs Lab 07/18/12 0740 07/21/12 0500  ALBUMIN  --  2.8*  INR 1.03  --     A:  History of Clostridium difficile toxin colitis  P:   NPO post extubation   HEMATOLOGIC  Recent Labs Lab 07/19/12 0408 07/21/12 0500 07/22/12 0410  HGB 8.8* 8.3* 8.3*  HCT 27.0* 25.3* 25.6*  WBC 6.2 8.1 9.5  PLT 294 257 219   A:  No acute hematologic issues P:  Monitor   INFECTIOUS No results found for this basename: LATICACIDVEN, PROCALCITON,  in the last 168 hours  A:  Clostridium difficile colitis  - PCR positive 2/15 - 07/15/2012: C. difficile PCR is negative P:   Micro and abx as above    ENDOCRINE A:  DM2 with inadequate control due to steroids P:   D/C systemic steroids Cont SSI   NEUROLOGIC A:  History of stroke history of seizure disorder Hypoactive delirium, resolved P:   Continue Keppra Minimize sedating meds      CRITICAL CARE: The patient is critically ill with multiple organ systems failure and requires high complexity decision making for assessment and support, frequent evaluation and titration of therapies, application of advanced monitoring technologies and extensive interpretation of multiple databases. Critical Care Time devoted to patient care services described in this note is 35 minutes.      Billy Fischer, MD ; [facility] [phone].  After 5:30 PM or weekends, call [phone]

## 2012-07-22 NOTE — Progress Notes (Signed)
Hypokalemia   K replaced  

## 2012-07-22 NOTE — Progress Notes (Deleted)
Admit Complaint: 79 year old [name] just discharged from the hospital several days ago returns with acute angioedema likely due to ACE inhibitor use.   Neuro/Sedation: Hx CVA, Sz, dementia. Propofol off, on precedex, RASS -1, CAM ICU positive.  Pulm: Intubated nasally d/t angioedema. FiO2= 0.30; Plans for early trach. Initially had some bleeding noted d/t nasal trauma- now resolved.   CV: Hx CAD, HTN. HR 40s, BP controlled on labetalol and prn hydralazine. EF 50-55% (1/14)  Renal: SCr continues to incr, UOP picking up, now 0.4 ml/k/h. Phos elevated, other lytes ok  GI: Hx GERD, LFTs ok this admit. NPO  Hem/ Onc: H/H decreasing, Plts nml. No overt bleeding  ID: Hx recurrent cdiff colitis (pcr postive 2/15). Afebrile, WBC nml. Was on PO Vanc PTA.  2/24: Po Vanc>>2/24 (never given) 2/24: Flagyl>>  2/23: Trach asp: neg 2/24: Cdiff: 2/23: MRSA PCR: neg  Endo: Hx DM, A1c 10.8. CBGs 129-200, on SSI. On steroids for angioedema.  Best Practices: SQ hep, H2RA  Home meds pending resumption: asa, aricept, amaryl, labetalol, lumigan, labetalol, lyrica, ambien  Plan: - Will monitor cx/spec/sens, renal fn and clinical status daily. - f/up CBC

## 2012-07-22 NOTE — Procedures (Signed)
Extubation Procedure Note  Patient Details:   Name: Glenda Gonzalez DOB: April 03, 1934 MRN: [id number]   Airway Documentation:  Airway 6.5 mm (Active)  Secured at (cm) 24 cm 07/22/2012  8:00 AM  Measured From Nare 07/22/2012  8:00 AM  Secured Location Right 07/22/2012  8:00 AM  Secured By [employer] 07/22/2012  8:00 AM  Cuff Pressure (cm H2O) 22 cm H2O 07/22/2012  7:29 AM  Site Condition Dry 07/21/2012  3:00 PM    Evaluation  O2 sats: stable throughout Complications: No apparent complications Patient did tolerate procedure well. Bilateral Breath Sounds: Clear Suctioning: Airway Yes  Ok Anis, MA 07/22/2012, 10:51 AM

## 2012-07-22 NOTE — Progress Notes (Signed)
Clinical Child psychotherapist received referral for [employer].  Pt currently intubated.  CSW to sign off at this time, please re consult if needed.   Angelia Mould, MSW, [place] [phone]

## 2012-07-23 LAB — GLUCOSE, CAPILLARY
Glucose-Capillary: 114 mg/dL — ABNORMAL HIGH (ref 70–99)
Glucose-Capillary: 123 mg/dL — ABNORMAL HIGH (ref 70–99)

## 2012-07-23 LAB — BASIC METABOLIC PANEL
BUN: 46 mg/dL — ABNORMAL HIGH (ref 6–23)
Calcium: 7.9 mg/dL — ABNORMAL LOW (ref 8.4–10.5)
Creatinine, Ser: 1.29 mg/dL — ABNORMAL HIGH (ref 0.50–1.10)
GFR calc Af Amer: 45 mL/min — ABNORMAL LOW (ref >90)

## 2012-07-23 MED ORDER — PHENOL 1.4 % MT LIQD
1.0000 | OROMUCOSAL | Status: DC | PRN
Start: 2012-07-23 — End: 2012-07-23

## 2012-07-23 MED ORDER — POTASSIUM CHLORIDE CRYS ER 20 MEQ PO TBCR
40.0000 meq | EXTENDED_RELEASE_TABLET | ORAL | Status: AC
  Administered 2012-07-23 (×2): 40 meq via ORAL
  Filled 2012-07-23 (×2): qty 2

## 2012-07-23 MED ORDER — WHITE PETROLATUM GEL
Status: AC
  Administered 2012-07-23: 04:00:00
  Filled 2012-07-23: qty 5

## 2012-07-23 MED ORDER — GLUCERNA SHAKE PO LIQD
237.0000 mL | Freq: Three times a day (TID) | ORAL | Status: DC
  Administered 2012-07-24 – 2012-07-26 (×7): 237 mL via ORAL

## 2012-07-23 MED ORDER — FREE WATER
200.0000 mL | Freq: Three times a day (TID) | Status: DC
  Administered 2012-07-23: 200 mL

## 2012-07-23 NOTE — Progress Notes (Signed)
NUTRITION FOLLOW UP  Intervention:   Glucerna Shake po TID, each supplement provides 220 kcal and 10 grams of protein.  Nutrition Dx:   Inadequate oral intake related to angioedema and poor appetite as evidenced by minimal intake of meals.  Ongoing.  Goal:   Intake to meet >90% of estimated nutrition needs, unmet.  Monitor:   PO intake, labs, weight trend.  Assessment:   Patient was extubated yesterday morning.  Tolerating liquids well per RN, but only taking bites of solid foods.    Height: Ht Readings from Last 1 Encounters:  07/18/12 5\' 5"  (1.651 m)    Weight Status:   Wt Readings from Last 1 Encounters:  07/22/12 194 lb 3.6 oz (88.1 kg)  07/20/12  185 lb 3 oz (84 kg)  07/19/12 184 lb 1.4 oz (83.5 kg)   Re-estimated needs:  Kcal: 1450-1650 Protein: 85-100 gm Fluid: 1.5-1.7 L  Skin: no problems  Diet Order: Dysphagia 3 with thin liquids   Intake/Output Summary (Last 24 hours) at 07/23/12 1006 Last data filed at 07/23/12 0900  Gross per 24 hour  Intake 1122.77 ml  Output   3105 ml  Net -1982.23 ml    Last BM: 2/27   Labs:   Recent Labs Lab 07/21/12 0500 07/21/12 1300 07/22/12 0410 07/23/12 0420  NA 141 142 144 143  K 3.8 4.0 3.2* 3.0*  CL 108 111 114* 112  CO2 18* 18* 16* 18*  BUN 51* 52* 55* 46*  CREATININE 1.80* 1.75* 1.59* 1.29*  CALCIUM 7.9* 7.5* 7.6* 7.9*  MG 2.0  --  1.9  --   PHOS 5.3*  --  4.0  --   GLUCOSE 209* 172* 197* 93    CBG (last 3)   Recent Labs  07/23/12 07/23/12 0403 07/23/12 0733  GLUCAP 89 95 114*    Scheduled Meds: . antiseptic oral rinse  15 mL Mouth Rinse QID  . chlorhexidine  15 mL Mouth Rinse BID  . heparin  5,000 Units Subcutaneous Q8H  . insulin aspart  0-20 Units Subcutaneous Q4H  . levETIRAcetam  500 mg Intravenous Q12H  . metroNIDAZOLE  500 mg Oral Q8H    Continuous Infusions: . sodium chloride 20 mL/hr at 07/22/12 0757  . dextrose 5 % with kcl 40 mL/hr at 07/22/12 1214     Joaquin Courts,  RD, LDN, CNSC Pager# [phone] After Hours Pager# [phone]

## 2012-07-23 NOTE — Evaluation (Signed)
Clinical/Bedside Swallow Evaluation Patient Details  Name: Glenda Gonzalez MRN: [id number] Date of Birth: 09/10/1933  Today's Date: 07/23/2012 Time: 1410-1436 SLP Time Calculation (min): 26 min  Past Medical History:  Past Medical History  Diagnosis Date  . Diabetes mellitus   . GERD (gastroesophageal reflux disease)   . Hypertension   . Diverticulitis   . Gout   . Essential and other specified forms of tremor   . Dementia   . CAD (coronary artery disease)   . CHF (congestive heart failure)     Diastolic dysfunction  . Stroke   . Seizures     r/t stroke   Past Surgical History:  Past Surgical History  Procedure Laterality Date  . Dilation and curettage of uterus    . Tumor removal     HPI:  Pt admitted 07/18/12 due to abdominal pain, nausea/vomiting/diarrhea. Intubated nasally 2/23-27/14.  Bedside swallow evaluation requested to assess po tolerance following intubation   Assessment / Plan / Recommendation Clinical Impression  Pt presents edentulous, reporting she has dentures at home, but is capable of eating solid foods without them in place. Oral motor function appears within functional limits. Volitional cough is weak, and voice quality is low in volume and slightly raspy, likely due to intubation. Pt exhibits no overt s/s aspiration with thin liquids or puree consistencies.  She politely refused trials of solids, stating she "really didn't want to eat anything", due to lack of appetite.  At this point, will downgrade diet to dys 2 (fine chop) with thin liquids, encourage that which pt will accept - cream soups, yogurts, etc ST to continue to follow for diet tolerance s/p extubation, as well as appropriateness to advance diet.    Aspiration Risk  Mild    Diet Recommendation Dysphagia 2 (Fine chop);Thin liquid (encourage cream soups, yogurt, magic cup)   Liquid Administration via: Straw;Cup Medication Administration: Whole meds with liquid Supervision: Patient able to  self feed;Intermittent supervision to cue for compensatory strategies Compensations: Slow rate;Small sips/bites Postural Changes and/or Swallow Maneuvers: Seated upright 90 degrees;Upright 30-60 min after meal    Other  Recommendations Oral Care Recommendations: Oral care BID   Follow Up Recommendations  24 hour supervision/assistance    Frequency and Duration min 1 x/week  1 week   Pertinent Vitals/Pain Discomfort on left side of neck due to central line. RN notified.    SLP Swallow Goals Patient will consume recommended diet without observed clinical signs of aspiration with: Set-up Swallow Study Goal #1 - Progress: Progressing toward goal   Swallow Study Prior Functional Status   Tolerating regular diet/thin liquids prior to admit.    General Date of Onset: 07/18/12 HPI: Pt admitted 07/18/12 due to abdominal pain, nausea/vomiting/diarrhea. Intubated nasally 2/23-27/14.  Bedside swallow evaluation requested to assess po tolerance following intubation Type of Study: Bedside swallow evaluation Diet Prior to this Study: Dysphagia 3 (soft);Thin liquids Temperature Spikes Noted: No Respiratory Status: Room air History of Recent Intubation: Yes Length of Intubations (days): 5 days Behavior/Cognition: Alert;Cooperative;Pleasant mood Oral Cavity - Dentition: Dentures, not available;Edentulous Self-Feeding Abilities: Able to feed self;Needs set up Patient Positioning: Upright in chair Baseline Vocal Quality: Hoarse;Low vocal intensity Volitional Cough: Weak Volitional Swallow: Able to elicit    Oral/Motor/Sensory Function Overall Oral Motor/Sensory Function: Appears within functional limits for tasks assessed   Ice Chips Ice chips: Within functional limits Presentation: Spoon   Thin Liquid Thin Liquid: Within functional limits Presentation: Straw    Nectar Thick Nectar Thick Liquid: Not  tested   Honey Thick Honey Thick Liquid: Not tested   Puree Puree: Within functional  limits Presentation: Spoon   Solid   Celia B. Bueche, [facility], CCC-SLP [phone]  Solid: Not tested Other Comments:  (Pt refused solid trial)       Leigh Aurora 07/23/2012,2:47 PM

## 2012-07-23 NOTE — Progress Notes (Signed)
Report called to [facility]. Transported pt. In wheelchair on tele with RN.  VSS. No distress noted.

## 2012-07-23 NOTE — Progress Notes (Signed)
07/23/2012 patient transfer from 2100 to 6700 at 1800. She is alert, oriented and up with assistant. Patient c/o weakness, she have red sock and was placed on bed alarm. Bruising to bilateral arms. On bilateral sacrum little scab area, bilateral arms swelling and lower legs. Under abdominal area little excoriation. Patient feet is dry and she c/o feet hurt whenever its being touch. She was placed on telemetry and doing sinus rhythm. Biochemist, clinical.

## 2012-07-23 NOTE — Progress Notes (Signed)
Pt admitted to the unit. Pt is alert and oriented. Pt oriented to room, staff, and call bell. Bed in lowest position. Iniated on telemetry. Call bell with in reach. Told to call for assists. Will continue to monitor. Burley Saver, RN

## 2012-07-23 NOTE — Progress Notes (Signed)
PULMONARY  / CRITICAL CARE MEDICINE  Name: Glenda Gonzalez MRN: [id number] DOB: 1933-11-30    ADMISSION DATE:  07/18/2012 REFERRING MD :  EDP  CHIEF COMPLAINT:  Angioedema  BRIEF PATIENT DESCRIPTION: 79 year old [name] just discharged from the hospital several days ago returns with acute angioedema likely due to ACE inhibitor use. The patient was intubated nasally in the emergency room by anesthesia and critical care is now admitting area past history was significant for coronary artery disease, dementia, congestive heart failure, reflux disease, hypertension, and recurrent C. difficile colitis   LINES / TUBES: L IJ CVL 02/23 >>  Nasal intubation 07/18/2012 >> 2/27  CULTURES: C diff 2/15 >> POSITIVE Respiratory culture 07/18/2012 - normal flora 2/23 MRSA PCR - neg 07/20/11 C Diff pcr - neg    ANTIBIOTICS:  Anti-infectives   Start     Dose/Rate Route Frequency Ordered Stop   07/21/12 1400  metroNIDAZOLE (FLAGYL) tablet 500 mg     500 mg Oral 3 times per day 07/21/12 1235     07/18/12 1200  vancomycin (VANCOCIN) 50 mg/mL oral solution 125 mg  Status:  Discontinued     125 mg Oral 4 times per day 07/18/12 1114 07/18/12 1141   07/18/12 1200  metroNIDAZOLE (FLAGYL) IVPB 500 mg  Status:  Discontinued     500 mg 100 mL/hr over 60 Minutes Intravenous 4 times per day 07/18/12 1141 07/21/12 1235        SIGNIFICANT EVENTS / STUDIES:  07/18/2012: Intubated nasally in the emergency room by anesthesia 07/19/12: Tongue still swollen. Cannot extubate 07/20/2012: Improved swelling of the tongue but unable to extubate due to lack of cuff leak and agitation on wake up assessment  2/27 Extubated. Tolerating well     SUBJECTIVE/OVERNIGHT/INTERVAL HX 07/23/12: Doing well after extubation she is just deconditioned and her voice is hoarse. She is tolerating a dysphagia 3 diet  VITAL SIGNS: Temp:  [97.8 F (36.6 C)-98.5 F (36.9 C)] 98.4 F (36.9 C) (02/28 0900) Pulse  Rate:  [55-91] 91 (02/28 0900) Resp:  [14-26] 21 (02/28 0900) BP: (135-191)/(51-81) 167/75 mmHg (02/28 0900) SpO2:  [94 %-100 %] 98 % (02/28 0900)   VENTILATOR SETTINGS:   INTAKE / OUTPUT: Intake/Output     02/27 0701 - 02/28 0700 02/28 0701 - 03/01 0700   P.O.  240   I.V. (mL/kg) 867.4 (9.8) 80 (0.9)   NG/GT 75    IV Piggyback     Total Intake(mL/kg) 942.4 (10.7) 320 (3.6)   Urine (mL/kg/hr) 2955 (1.4) 400 (0.9)   Stool 200 (0.1)    Total Output 3155 400   Net -2212.6 -80          PHYSICAL EXAMINATION: General:  RASS 0. + F/C Neuro:  No focal deficits  HEENT:  WNL. No tongue swelling noted voice is hoarse Cardiovascular:  RRR s M Lungs: Clear Abdomen: Soft, NABS Ext: warm, no edema   CXR: RLL atx vs infiltrate    ASSESSMENT / PLAN:  PULMONARY A: Acute respiratory failure due to upper airway obstruction/severe angioedema/tongue swelling   Resolved 2/27 but on 07/23/2012 her voice is hoarse  P:    okay to move to the floor  CARDIOVASCULAR  Recent Labs Lab 07/22/12 0410  PROBNP 979.7*    No results found for this basename: TROPONINI,  in the last 168 hours  A: Hypertension History of systolic heart failure  sinus brady, resolved P:  Cont PRN hydralazine No ACE inhibitor ever   RENAL  Recent Labs Lab 07/19/12 0408 07/21/12 0500 07/21/12 1300 07/22/12 0410 07/23/12 0420  NA 141 141 142 144 143  K 4.1 3.8 4.0 3.2* 3.0*  CL 107 108 111 114* 112  CO2 23 18* 18* 16* 18*  GLUCOSE 161* 209* 172* 197* 93  BUN 29* 51* 52* 55* 46*  CREATININE 1.26* 1.80* 1.75* 1.59* 1.29*  CALCIUM 8.5 7.9* 7.5* 7.6* 7.9*  MG  --  2.0  --  1.9  --   PHOS  --  5.3*  --  4.0  --     A:  Acute renal insuff - Cr improving Mild hypokalemia 07/23/2012 P:   Saline lock IV Potassium repletion  GASTROINTESTINAL  Recent Labs Lab 07/18/12 0740 07/21/12 0500  ALBUMIN  --  2.8*  INR 1.03  --     A:  History of Clostridium difficile toxin colitis  P:    Currently on dysphagia 3 diet but will call speech therapy   HEMATOLOGIC  Recent Labs Lab 07/19/12 0408 07/21/12 0500 07/22/12 0410  HGB 8.8* 8.3* 8.3*  HCT 27.0* 25.3* 25.6*  WBC 6.2 8.1 9.5  PLT 294 257 219   A:  No acute hematologic issues P:  Monitor Packed red cells for hemoglobin less than 7 g percent   INFECTIOUS No results found for this basename: LATICACIDVEN, PROCALCITON,  in the last 168 hours  A:  Clostridium difficile colitis  - PCR positive 2/15 - 07/15/2012: C. difficile PCR is negative P:   Flagyl as above, have requested pharmacy to establish stop date   ENDOCRINE A:  DM2 with inadequate control due to steroids P:   D/C systemic steroids Cont SSI   NEUROLOGIC A:  History of stroke history of seizure disorder Hypoactive delirium, resolved P:   Continue Keppra Minimize sedating meds   07/21/2012 husband was updated at the bedside 07/23/2012 move to telemetry on [facility] hospitalist service     Dr. Kalman Shan, M.D., [facility].C.P Pulmonary and Critical Care Medicine Staff Physician [facility] Pager: [phone], If no answer or between  15:00h - 7:00h: call [phone]  07/23/2012 12:08 PM

## 2012-07-24 LAB — BASIC METABOLIC PANEL
BUN: 37 mg/dL — ABNORMAL HIGH (ref 6–23)
Calcium: 8 mg/dL — ABNORMAL LOW (ref 8.4–10.5)
Chloride: 109 mEq/L (ref 96–112)
Creatinine, Ser: 1.18 mg/dL — ABNORMAL HIGH (ref 0.50–1.10)
GFR calc Af Amer: 51 mL/min — ABNORMAL LOW (ref >90)

## 2012-07-24 LAB — PHOSPHORUS: Phosphorus: 1.9 mg/dL — ABNORMAL LOW (ref 2.3–4.6)

## 2012-07-24 LAB — GLUCOSE, CAPILLARY
Glucose-Capillary: 117 mg/dL — ABNORMAL HIGH (ref 70–99)
Glucose-Capillary: 124 mg/dL — ABNORMAL HIGH (ref 70–99)
Glucose-Capillary: 125 mg/dL — ABNORMAL HIGH (ref 70–99)
Glucose-Capillary: 160 mg/dL — ABNORMAL HIGH (ref 70–99)
Glucose-Capillary: 73 mg/dL (ref 70–99)
Glucose-Capillary: 86 mg/dL (ref 70–99)

## 2012-07-24 LAB — MAGNESIUM: Magnesium: 1.7 mg/dL (ref 1.5–2.5)

## 2012-07-24 MED ORDER — SODIUM CHLORIDE 0.9 % IJ SOLN
10.0000 mL | INTRAMUSCULAR | Status: DC | PRN
  Administered 2012-07-24 – 2012-07-25 (×2): 20 mL

## 2012-07-24 MED ORDER — ONDANSETRON HCL 4 MG/2ML IJ SOLN
4.0000 mg | Freq: Four times a day (QID) | INTRAMUSCULAR | Status: DC | PRN
  Administered 2012-07-24: 4 mg via INTRAVENOUS
  Filled 2012-07-24: qty 2

## 2012-07-24 MED ORDER — OXYCODONE HCL 5 MG PO TABS
5.0000 mg | ORAL_TABLET | Freq: Four times a day (QID) | ORAL | Status: DC | PRN
  Administered 2012-07-24 – 2012-07-27 (×9): 5 mg via ORAL
  Filled 2012-07-24 (×9): qty 1

## 2012-07-24 NOTE — Evaluation (Signed)
Physical Therapy Evaluation Patient Details Name: Glenda Gonzalez MRN: [id number] DOB: Dec 16, 1933 Today's Date: 07/24/2012 Time: 4098-1191 PT Time Calculation (min): 25 min  PT Assessment / Plan / Recommendation Clinical Impression  patient admitted s/p reaction to medication, requiring intubation.  Patient extubated at this time and demonstrated generalized weakness limiting independence and mobility.  Patient has great family support and 24 hour care at d/c, however, question if family able to care for patient at this level.  Recommend CIR for intensive therapy to return to baseline for d/c home with family.    PT Assessment  Patient needs continued PT services    Follow Up Recommendations  CIR    Does the patient have the potential to tolerate intense rehabilitation      Barriers to Discharge None      Equipment Recommendations  None recommended by PT    Recommendations for Other Services     Frequency Min 3X/week    Precautions / Restrictions Precautions Precautions: Fall Restrictions Weight Bearing Restrictions: No   Pertinent Vitals/Pain Patient complained of pain in neck where central line located.      Mobility  Bed Mobility Bed Mobility: Supine to Sit;Sitting - Scoot to Edge of Bed Supine to Sit: 3: Mod assist Sitting - Scoot to Edge of Bed: 4: Min assist Details for Bed Mobility Assistance: required physical assitance supine - sit, verbal cues Transfers Transfers: Stand Pivot Transfers Stand Pivot Transfers: 3: Mod assist Details for Transfer Assistance: unable to reach upright for sit-to-stand with 1 person assist.      Exercises     PT Diagnosis: Difficulty walking  PT Problem List: Decreased strength;Decreased activity tolerance;Decreased mobility PT Treatment Interventions: Gait training;Stair training;Functional mobility training;Therapeutic activities;Therapeutic exercise;Patient/family education   PT Goals Acute Rehab PT Goals PT Goal  Formulation: With patient Time For Goal Achievement: 08/07/12 Potential to Achieve Goals: Good Pt will go Supine/Side to Sit: with supervision;with HOB 0 degrees PT Goal: Supine/Side to Sit - Progress: Goal set today Pt will go Sit to Supine/Side: with supervision;with HOB 0 degrees PT Goal: Sit to Supine/Side - Progress: Goal set today Pt will go Sit to Stand: with supervision;with upper extremity assist PT Goal: Sit to Stand - Progress: Goal set today Pt will go Stand to Sit: with supervision;with upper extremity assist PT Goal: Stand to Sit - Progress: Goal set today Pt will Ambulate: 16 - 50 feet;with supervision;with rolling walker PT Goal: Ambulate - Progress: Goal set today  Visit Information  Last PT Received On: 07/24/12 Assistance Needed: +2    Subjective Data  Subjective: Patient reports she may go home on Monday Patient Stated Goal: return home   Prior Functioning  Home Living Lives With: Spouse;Son Available Help at Discharge: Family Type of Home: House Home Access: Ramped entrance;Stairs to enter [employer] of Steps: 5 steps with rail at [employer], ramp at pts house Entrance Stairs-Rails: Right;Left;Can reach both Home Layout: One level Firefighter: Standard Home Adaptive Equipment: Bedside commode/3-in-1;Walker - rolling;Straight cane;Wheelchair - manual Additional Comments: spouse works and dgtr picks up pt and takes her to [employer] each day until spouse gets off work and picks up pt.  Prior Function Level of Independence: Needs assistance Needs Assistance: Gait Meal Prep: Total Light Housekeeping: Total Gait Assistance: with cane, WC for long distance 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No difficulties (a little hoarse) Dominant Hand: Right    Cognition  Cognition Overall Cognitive Status: Appears within functional  limits for tasks assessed/performed Arousal/Alertness:  Awake/alert Behavior During Session: [facility] for tasks performed    Extremity/Trunk Assessment Right Lower Extremity Assessment RLE ROM/Strength/Tone: Deficits RLE ROM/Strength/Tone Deficits: strength grossly 3/5, not able to take resistance Left Lower Extremity Assessment LLE ROM/Strength/Tone: Deficits LLE ROM/Strength/Tone Deficits: strength grossly 3/5, not able to take resistance   Balance Balance Balance Assessed: No  End of Session PT - End of Session Equipment Utilized During Treatment: Gait belt Activity Tolerance: Patient limited by fatigue Patient left: in chair;with family/visitor present Nurse Communication: Mobility status  GP     Olivia Canter, [place] [phone] 07/24/2012, 12:22 PM

## 2012-07-24 NOTE — Progress Notes (Signed)
[facility] PROGRESS NOTE  Glenda Gonzalez JYN:[id number] DOB: 03-21-1934 DOA: 07/18/2012 PCP: Billee Cashing, MD  Assessment/Plan: Principal Problem:   ACE inhibitor-aggravated angioedema Active Problems:   DM   HYPERTENSION   C. difficile colitis   ARF (acute renal failure)   Acute respiratory failure with hypoxia     A: Acute respiratory failure due to upper airway obstruction/severe angioedema/tongue swelling  Resolved 2/27 but on 07/23/2012 her voice is hoarse Stable  Hypertension ,History of systolic heart failure .sinus brady, resolved  Cont PRN hydralazine ,No ACE inhibitor ever  Acute renal insuff - Cr improving Mild hypokalemia 07/23/2012  Saline lock IV  Potassium repletion   History of Clostridium difficile toxin colitis PCR positive 2/15  - 07/15/2012: C. difficile PCR is negative  Dysphagia 2 (Fine chop);Thin liquid (encourage cream soups, yogurt, magic cup  DM2 with inadequate control due to steroids  D/C systemic steroids  Cont SSI  History of stroke history of seizure disorder Hypoactive delirium, resolved   Continue Keppra  Minimize sedating meds   Code Status: full Family Communication: family updated about patient's clinical progress Disposition Plan:  As above    Brief narrative: 79 year old [name] just discharged from the hospital several days ago returns with acute angioedema likely due to ACE inhibitor use. The patient was intubated nasally in the emergency room by anesthesia and critical care is now admitting area past history was significant for coronary artery disease, dementia, congestive heart failure, reflux disease, hypertension, and recurrent C. difficile colitis   LINES / TUBES:  L IJ CVL 02/23 >>  Nasal intubation 07/18/2012 >> 2/27  CULTURES:  C diff 2/15 >> POSITIVE  Respiratory culture 07/18/2012 - normal flora  2/23 MRSA PCR - neg  07/20/11 C Diff pcr - neg  ANTIBIOTICS:  Anti-infectives     Start    Dose/Rate  Route  Frequency  Ordered  Stop    07/21/12 1400   metroNIDAZOLE (FLAGYL) tablet 500 mg  500 mg  Oral  3 times per day  07/21/12 1235     07/18/12 1200   vancomycin (VANCOCIN) 50 mg/mL oral solution 125 mg Status: Discontinued  125 mg  Oral  4 times per day  07/18/12 1114  07/18/12 1141    07/18/12 1200   metroNIDAZOLE (FLAGYL) IVPB 500 mg Status: Discontinued  500 mg  100 mL/hr over 60 Minutes  Intravenous  4 times per day  07/18/12 1141  07/21/12 1235      SIGNIFICANT EVENTS / STUDIES:  07/18/2012: Intubated nasally in the emergency room by anesthesia  07/19/12: Tongue still swollen. Cannot extubate  07/20/2012: Improved swelling of the tongue but unable to extubate due to lack of cuff leak and agitation on wake up assessment  2/27 Extubated. Tolerating well  SUBJECTIVE/OVERNIGHT/INTERVAL HX  07/23/12: Doing well after extubation she is just deconditioned and her voice is hoarse. She is tolerating a dysphagia 3 diet      Objective: Filed Vitals:   07/23/12 1500 07/23/12 1622 07/23/12 2033 07/24/12 0524  BP: 177/75 169/69 151/60 128/45  Pulse: 65 63 72 73  Temp:  98.4 F (36.9 C) 98.3 F (36.8 C) 98.7 F (37.1 C)  TempSrc:  Oral Oral Oral  Resp: 16 18 18 17   Height:   5\' 2"  (1.575 m)   Weight:   86.773 kg (191 lb 4.8 oz)   SpO2: 100% 100% 94% 100%    Intake/Output Summary (Last 24 hours) at 07/24/12 0849 Last data filed at 07/24/12  5366  Gross per 24 hour  Intake    540 ml  Output    450 ml  Net     90 ml    Exam: PHYSICAL EXAMINATION:  General: RASS 0. + F/C  Neuro: No focal deficits  HEENT: WNL. No tongue swelling noted voice is hoarse  Cardiovascular: RRR s M  Lungs: Clear  Abdomen: Soft, NABS  Ext: warm, no edema    Data Reviewed: Basic Metabolic Panel:  Recent Labs Lab 07/21/12 0500 07/21/12 1300 07/22/12 0410 07/23/12 0420 07/24/12 0600  NA 141 142 144 143 139  K 3.8 4.0 3.2* 3.0* 4.2  CL 108 111 114* 112 109  CO2 18* 18*  16* 18* 19  GLUCOSE 209* 172* 197* 93 131*  BUN 51* 52* 55* 46* 37*  CREATININE 1.80* 1.75* 1.59* 1.29* 1.18*  CALCIUM 7.9* 7.5* 7.6* 7.9* 8.0*  MG 2.0  --  1.9  --  1.7  PHOS 5.3*  --  4.0  --  1.9*    Liver Function Tests:  Recent Labs Lab 07/21/12 0500  ALBUMIN 2.8*   No results found for this basename: LIPASE, AMYLASE,  in the last 168 hours No results found for this basename: AMMONIA,  in the last 168 hours  CBC:  Recent Labs Lab 07/18/12 0725 07/19/12 0408 07/21/12 0500 07/22/12 0410  WBC 7.2 6.2 8.1 9.5  NEUTROABS 4.9  --   --  8.4*  HGB 10.1* 8.8* 8.3* 8.3*  HCT 30.0* 27.0* 25.3* 25.6*  MCV 81.7 82.3 82.7 83.4  PLT 304 294 257 219    Cardiac Enzymes: No results found for this basename: CKTOTAL, CKMB, CKMBINDEX, TROPONINI,  in the last 168 hours BNP (last 3 results)  Recent Labs  06/08/12 2309 07/22/12 0410  PROBNP 234.3 979.7*     CBG:  Recent Labs Lab 07/23/12 1635 07/23/12 2029 07/23/12 2359 07/24/12 0415 07/24/12 0800  GLUCAP 123* 120* 73 125* 86    Recent Results (from the past 240 hour(s))  MRSA PCR SCREENING     Status: None   Collection Time    07/18/12 11:17 AM      Result Value Range Status   MRSA by PCR NEGATIVE  NEGATIVE Final   Comment:            The GeneXpert MRSA Assay (FDA     approved for NASAL specimens     only), is one component of a     comprehensive MRSA colonization     surveillance program. It is not     intended to diagnose MRSA     infection nor to guide or     monitor treatment for     MRSA infections.  CULTURE, RESPIRATORY (NON-EXPECTORATED)     Status: None   Collection Time    07/18/12 12:13 PM      Result Value Range Status   Specimen Description TRACHEAL ASPIRATE   Final   Special Requests Normal   Final   Gram Stain     Final   Value: FEW WBC PRESENT,BOTH PMN AND MONONUCLEAR     RARE SQUAMOUS EPITHELIAL CELLS PRESENT     NO ORGANISMS SEEN   Culture Non-Pathogenic Oropharyngeal-type Flora  Isolated.   Final   Report Status 07/20/2012 FINAL   Final  CLOSTRIDIUM DIFFICILE BY PCR     Status: None   Collection Time    07/19/12  6:48 PM      Result Value Range Status   C difficile  by pcr NEGATIVE  NEGATIVE Final     Studies: Dg Abd 1 View  07/13/2012  *RADIOLOGY REPORT*  Clinical Data: Abdominal discomfort.  ABDOMEN - 1 VIEW  Comparison: C T 07/09/2012  Findings: A nonobstructive bowel gas pattern.  No free air, organomegaly or suspicious calcification.  No acute bony abnormality.  IMPRESSION: No acute findings.   Original Report Authenticated By: Charlett Nose, M.D.    Ct Abdomen Pelvis W Contrast  07/09/2012  *RADIOLOGY REPORT*  Clinical Data: 79 year old female with left-sided abdominal and pelvic pain.  CT ABDOMEN AND PELVIS WITH CONTRAST  Technique:  Multidetector CT imaging of the abdomen and pelvis was performed following the standard protocol during bolus administration of intravenous contrast.  Contrast: 80mL OMNIPAQUE IOHEXOL 300 MG/ML  SOLN  Comparison: 03/08/2012 CT  Findings: There is diffuse circumferential wall thickening of a large segment of mid small bowel, primarily within the left and lower abdomen and pelvis.  A small amount of ascites is noted. There is no evidence of pneumoperitoneum, bowel obstruction or pneumatosis intestinalis. Omental stranding in the left abdomen noted and may be secondary to the enteritis.  The liver, spleen, adrenal glands, pancreas and gallbladder are unremarkable. Bilateral renal cortical atrophy and left renal scarring noted.  There is no evidence of biliary dilatation, enlarged lymph nodes or abdominal aortic aneurysm.  The visualized mesenteric arteries are patent.  Hysterectomy and pessary again noted. A curvilinear density within the sigmoid colon is unchanged. Postoperative changes within the anterior abdominal/pelvic wall again noted.  No acute or suspicious bony abnormalities are noted.  IMPRESSION: Diffuse long segment circumferential  small bowel wall thickening and adjacent inflammation with small amount of ascites - likely inflammatory or infectious enteritis. No evidence of bowel obstruction or pneumoperitoneum.  Renal atrophy and left renal scarring.   Original Report Authenticated By: Harmon Pier, M.D.    Dg Chest Port 1 View  07/22/2012  *RADIOLOGY REPORT*  Clinical Data: Shortness of breath.  Assess endotracheal tube.  PORTABLE CHEST - 1 VIEW  Comparison: 07/19/2012  Findings: Endotracheal tube tip measures 3.2 cm above the carina. Since the previous study, enteric tube has been placed.  The tip is at the field of view but below the left hemidiaphragm consistent with location at or below the stomach.  Left central venous catheter is unchanged in position.  Shallow inspiration.  Mild cardiac enlargement.  Increased density in the right base suggest infiltration or atelectasis.  No blunting of costophrenic angles. No pneumothorax.  No significant change since previous study.  IMPRESSION: Interval placement enteric tube with tip below the left hemidiaphragm.  Otherwise stable appearance of the chest.  Mild cardiac enlargement.  Infiltration or atelectasis in the right lung base.   Original Report Authenticated By: Burman Nieves, M.D.    Portable Chest Xray In Am  07/19/2012  *RADIOLOGY REPORT*  Clinical Data: Acute respiratory failure on ventilator.  Renal failure and congestive heart failure.  PORTABLE CHEST - 1 VIEW  Comparison: 07/18/2012  Findings: Endotracheal tube and left internal jugular center venous catheter remain in appropriate position.  Both lungs are clear. Heart size remains stable.  IMPRESSION: Stable exam.  No acute findings.   Original Report Authenticated By: Myles Rosenthal, M.D.    Dg Chest Portable 1 View  07/18/2012  *RADIOLOGY REPORT*  Clinical Data: Central line placement  PORTABLE CHEST - 1 VIEW  Comparison: Same day  Findings: The left internal jugular central line has its tip in the SVC 3 cm above the right  atrium.  No pneumothorax.  Endotracheal tube is unchanged with its tip 4.5 cm above the carina.  Lungs are well aerated.  IMPRESSION: Central line well positioned with its tip in the SV 3 cm above the right atrium.  No complication related to line placement.   Original Report Authenticated By: Paulina Fusi, M.D.    Portable Chest Xray To Verify Ett/ Central Line Placement  07/18/2012  *RADIOLOGY REPORT*  Clinical Data: Central line and endotracheal tube placement.  PORTABLE CHEST - 1 VIEW  Comparison: Earlier in the morning at 0729 hours.  Findings: Endotracheal tube terminates at the level of the clavicles, 4.8 cm above carina.  No central line is seen. Numerous leads and wires project over the chest.  Mild cardiomegaly. No pleural effusion or pneumothorax.  Improved right base aeration.  IMPRESSION: Improved right base aeration, likely representing resolving atelectasis since earlier in the morning.  Stable position of endotracheal tube; no central line is seen.  Cardiomegaly without congestive failure.   Original Report Authenticated By: Jeronimo Greaves, M.D.    Dg Chest Port 1 View  07/18/2012  *RADIOLOGY REPORT*  Clinical Data: Respiratory failure and intubation.  PORTABLE CHEST - 1 VIEW  Comparison: 06/12/2012  Findings: Endotracheal tube present with the tip approximately 3.5 cm above the carina.  Lungs show atelectasis versus infiltrate in the right lower lung.  No pulmonary edema.  Stable cardiomegaly. No pleural effusions identified.  IMPRESSION: Satisfactory positioning of endotracheal tube.  Atelectasis versus infiltrate at the right lung base.   Original Report Authenticated By: Irish Lack, M.D.     Scheduled Meds: . antiseptic oral rinse  15 mL Mouth Rinse QID  . feeding supplement  237 mL Oral TID BM  . heparin  5,000 Units Subcutaneous Q8H  . insulin aspart  0-20 Units Subcutaneous Q4H  . levETIRAcetam  500 mg Intravenous Q12H  . metroNIDAZOLE  500 mg Oral Q8H   Continuous  Infusions: . sodium chloride 20 mL/hr at 07/22/12 0757    Principal Problem:   ACE inhibitor-aggravated angioedema Active Problems:   DM   HYPERTENSION   C. difficile colitis   ARF (acute renal failure)   Acute respiratory failure with hypoxia    Time spent: 40 minutes   [facility]  [facility] Pager [phone]. If 8PM-8AM, please contact night-coverage at [email or web address], password [facility] 07/24/2012, 8:49 AM  LOS: 6 days

## 2012-07-25 DIAGNOSIS — R569 Unspecified convulsions: Secondary | ICD-10-CM | POA: Diagnosis present

## 2012-07-25 DIAGNOSIS — D638 Anemia in other chronic diseases classified elsewhere: Secondary | ICD-10-CM | POA: Diagnosis present

## 2012-07-25 LAB — CBC
HCT: 24.8 % — ABNORMAL LOW (ref 36.0–46.0)
Hemoglobin: 8.5 g/dL — ABNORMAL LOW (ref 12.0–15.0)
MCH: 27.7 pg (ref 26.0–34.0)
MCHC: 34.3 g/dL (ref 30.0–36.0)
MCV: 80.8 fL (ref 78.0–100.0)
Platelets: 210 10*3/uL (ref 150–400)
RBC: 3.07 MIL/uL — ABNORMAL LOW (ref 3.87–5.11)
RDW: 19 % — ABNORMAL HIGH (ref 11.5–15.5)
WBC: 20.2 10*3/uL — ABNORMAL HIGH (ref 4.0–10.5)

## 2012-07-25 LAB — GLUCOSE, CAPILLARY
Glucose-Capillary: 133 mg/dL — ABNORMAL HIGH (ref 70–99)
Glucose-Capillary: 170 mg/dL — ABNORMAL HIGH (ref 70–99)
Glucose-Capillary: 173 mg/dL — ABNORMAL HIGH (ref 70–99)
Glucose-Capillary: 175 mg/dL — ABNORMAL HIGH (ref 70–99)

## 2012-07-25 LAB — BASIC METABOLIC PANEL
BUN: 29 mg/dL — ABNORMAL HIGH (ref 6–23)
Creatinine, Ser: 1.12 mg/dL — ABNORMAL HIGH (ref 0.50–1.10)
GFR calc Af Amer: 54 mL/min — ABNORMAL LOW (ref >90)
GFR calc non Af Amer: 46 mL/min — ABNORMAL LOW (ref >90)
Potassium: 4.5 mEq/L (ref 3.5–5.1)

## 2012-07-25 MED ORDER — HYDRALAZINE HCL 10 MG PO TABS
10.0000 mg | ORAL_TABLET | Freq: Four times a day (QID) | ORAL | Status: DC | PRN
  Administered 2012-07-25: 10 mg via ORAL
  Filled 2012-07-25: qty 1

## 2012-07-25 MED ORDER — LEVETIRACETAM 500 MG PO TABS
500.0000 mg | ORAL_TABLET | Freq: Two times a day (BID) | ORAL | Status: DC
  Administered 2012-07-25 – 2012-07-27 (×4): 500 mg via ORAL
  Filled 2012-07-25 (×6): qty 1

## 2012-07-25 NOTE — Progress Notes (Signed)
[facility] PROGRESS NOTE  Glenda Gonzalez ZOX:[id number] DOB: 13-Mar-1934 DOA: 07/18/2012 PCP: Billee Cashing, MD  Brief narrative: 79 year old [name] just discharged from the hospital several days prior to this admission returns with acute angioedema likely due to ACE inhibitor use. The patient was intubated nasally in the emergency room by anesthesia. Patient was initially under PCCM care and now transferred to [facility] for further management as patient is extubated. At this time we are awaiting CIR consultation and recommendations.  Assessment/Plan:  Principal Problem:   *ACE inhibitor-aggravated angioedema  Extubated, protecting airways Active Problems:   DM  Continue sliding scale insulin   HYPERTENSION  Hydralazine prn   C. difficile colitis  Continue flagyl   ARF (acute renal failure)  Secondary to ACEi  Renal function improving   Anemia of chronic disease  Likely secondary to history of diabetes  No signs of active bleed  Hemoglobin stable at 8.5   Seizures  Switch to PO Keppra 500 mg BID  Code Status: full code Family Communication: no family at bedside Disposition Plan: Inpatient rehab consult - pending  Manson Passey, MD  [facility] Pager [phone]  If 7PM-7AM, please contact night-coverage [email or web address] Password [email or web address] 07/25/2012, 12:55 PM   LOS: 7 days   Consultants:  PCCM transfer to [facility] starting 07/25/2012  Inpatient rehab  Procedures:  Intubation in ED on admission  Antibiotics:  Flagyl  HPI/Subjective: No acute overnight events.  Objective: Filed Vitals:   07/25/12 0008 07/25/12 0509 07/25/12 0910 07/25/12 1242  BP: 134/71 117/51 150/68 159/61  Pulse: 112 83 74 73  Temp:  100 F (37.8 C) 98.6 F (37 C) 99.5 F (37.5 C)  TempSrc:  Oral Oral Oral  Resp:  21 19 18   Height:      Weight:      SpO2:  96% 99% 99%    Intake/Output Summary (Last 24 hours) at 07/25/12 1255 Last data filed at 07/24/12 1700  Gross  per 24 hour  Intake    920 ml  Output   1000 ml  Net    -80 ml    Exam:   General:  Pt is  not in acute distress  Cardiovascular: Regular rate and rhythm, S1/S2 appreciated  Respiratory: Clear to auscultation bilaterally, no wheezing  Abdomen: Soft, non tender, non distended, bowel sounds present, no guarding  Extremities: Pulses DP and PT palpable bilaterally  Neuro: Grossly nonfocal  Data Reviewed: Basic Metabolic Panel:  Recent Labs Lab 07/21/12 0500 07/21/12 1300 07/22/12 0410 07/23/12 0420 07/24/12 0600 07/25/12 0445  NA 141 142 144 143 139 133*  K 3.8 4.0 3.2* 3.0* 4.2 4.5  CL 108 111 114* 112 109 103  CO2 18* 18* 16* 18* 19 21  GLUCOSE 209* 172* 197* 93 131* 184*  BUN 51* 52* 55* 46* 37* 29*  CREATININE 1.80* 1.75* 1.59* 1.29* 1.18* 1.12*  CALCIUM 7.9* 7.5* 7.6* 7.9* 8.0* 7.8*  MG 2.0  --  1.9  --  1.7  --   PHOS 5.3*  --  4.0  --  1.9*  --    Liver Function Tests:  Recent Labs Lab 07/21/12 0500  ALBUMIN 2.8*   CBC:  Recent Labs Lab 07/19/12 0408 07/21/12 0500 07/22/12 0410 07/25/12 0445  WBC 6.2 8.1 9.5 20.2*  NEUTROABS  --   --  8.4*  --   HGB 8.8* 8.3* 8.3* 8.5*  HCT 27.0* 25.3* 25.6* 24.8*  MCV 82.3 82.7 83.4 80.8  PLT 294 257 219 210  CBG:  Recent Labs Lab 07/24/12 1956 07/25/12 0008 07/25/12 0405 07/25/12 0811 07/25/12 1155  GLUCAP 160* 170* 173* 134* 175*    MRSA PCR SCREENING     Status: None   Collection Time    07/18/12 11:17 AM      Result Value Range Status   MRSA by PCR NEGATIVE  NEGATIVE Final  CULTURE, RESPIRATORY (NON-EXPECTORATED)     Status: None   Collection Time    07/18/12 12:13 PM      Result Value Range Status   Specimen Description TRACHEAL ASPIRATE   Final   Culture Non-Pathogenic Oropharyngeal-type Flora Isolated.   Final   Report Status 07/20/2012 FINAL   Final  CLOSTRIDIUM DIFFICILE BY PCR     Status: None   Collection Time    07/19/12  6:48 PM      Result Value Range Status   C difficile  by pcr NEGATIVE  NEGATIVE Final     Studies: No results found.  Scheduled Meds: . insulin aspart  0-20 Units Subcutaneous Q4H  . levETIRAcetam  500 mg Intravenous Q12H  . metroNIDAZOLE  500 mg Oral Q8H

## 2012-07-25 NOTE — Progress Notes (Signed)
Per MD order, central line removed. IV cathter intact. Vaseline pressure gauze to site, pressure held x 5 min, no bleeding to site. Pt instructed not to get out of bed for 30 min after the removal of the central line. Instucted to keep dressing CDI x 24hours, if bleeding occurs hold pressure, and contact nurse. Pt verbalized understanding and did not have any questions. Timmons, Christina M  

## 2012-07-26 LAB — BASIC METABOLIC PANEL
BUN: 23 mg/dL (ref 6–23)
CO2: 21 mEq/L (ref 19–32)
Calcium: 7.6 mg/dL — ABNORMAL LOW (ref 8.4–10.5)
Creatinine, Ser: 1.09 mg/dL (ref 0.50–1.10)
GFR calc Af Amer: 56 mL/min — ABNORMAL LOW (ref >90)

## 2012-07-26 LAB — GLUCOSE, CAPILLARY
Glucose-Capillary: 112 mg/dL — ABNORMAL HIGH (ref 70–99)
Glucose-Capillary: 106 mg/dL — ABNORMAL HIGH (ref 70–99)
Glucose-Capillary: 135 mg/dL — ABNORMAL HIGH (ref 70–99)

## 2012-07-26 MED ORDER — METRONIDAZOLE 500 MG PO TABS: 500.0000 mg | ORAL_TABLET | Freq: Three times a day (TID) | ORAL | Status: DC

## 2012-07-26 MED ORDER — OXYCODONE HCL 5 MG PO TABS: 5.0000 mg | ORAL_TABLET | Freq: Four times a day (QID) | ORAL | Status: DC | PRN

## 2012-07-26 NOTE — Care Management Note (Signed)
   CARE MANAGEMENT NOTE 07/26/2012  Patient:  Glenda Gonzalez,Glenda Gonzalez   Account Number:  [id number]  Date Initiated:  07/19/2012  Documentation initiated by:  BROWN,SARAH  Subjective/Objective Assessment:   resp failure - nasally intubated.     Action/Plan:   CM contacted [facility] to resume HH services.   Anticipated DC Date:  07/26/2012   Anticipated DC Plan:  HOME W HOME HEALTH SERVICES      DC Planning Services  CM consult      Choice offered to / List presented to:          HH arranged  HH-1 RN  HH-2 PT  HH-3 OT      HH agency  [facility]   Status of service:  Completed, signed off Medicare Important Message given?   (If response is "NO", the following Medicare IM given date fields will be blank) Date Medicare IM given:   Date Additional Medicare IM given:    Discharge Disposition:  HOME W HOME HEALTH SERVICES  Per UR Regulation:  Reviewed for med. necessity/level of care/duration of stay  If discussed at Long Length of Stay Meetings, dates discussed:    Comments:  Contact:  [place],Anthony E Spouse [phone]                 [place]leyne,Paul Son [phone]  07/26/2012 Pt active with [facility] for HH needs. [facility] notified and will resume HHRN, HHPT, HHOT. Cheryl Royal RN MPH [phone]   07-19-12 9:22am Sarah Brown, RNBSN - [phone] Past admission patient was sent home with [facility] and PT.    

## 2012-07-26 NOTE — Progress Notes (Signed)
[employer] Medicare insurance will not approve an inpt rehab admission for this diagnosis. I met with pt and she is aware. She does not agree to SNF. She was in a SNF a year ago and states her family promised her she would never go again. Her daughter picks her up daily in the morning when her spouse works. Otherwise she is at home with her spouse. I updated RN CM and SW. [phone]

## 2012-07-26 NOTE — Progress Notes (Signed)
[facility] PROGRESS NOTE  Glenda Gonzalez KGM:[id number] DOB: [birth year] DOA: 07/18/2012 PCP: Billee Cashing, MD  Brief narrative: 79 year old [name] just discharged from the hospital several days prior to this admission returns with acute angioedema likely due to ACE inhibitor use. The patient was intubated nasally in the emergency room by anesthesia. Patient was initially under PCCM care and now transferred to [facility] for further management as patient is extubated. At this time we are awaiting CIR consultation and recommendations.   Assessment/Plan:   Principal Problem:  *ACE inhibitor-aggravated angioedema  Extubated, protecting airways Active Problems:  DM  Continue sliding scale insulin CBG under good control while in hospital: 112, 106 and 135 in past 24 hours HYPERTENSION  Hydralazine prn BP 137/69 C. difficile colitis  Continue flagyl ARF (acute renal failure)  Secondary to ACEi  Renal function improving; today WNL Anemia of chronic disease  Likely secondary to history of diabetes  No signs of active bleed  Hemoglobin stable at 8.5 Seizures  Switch to PO Keppra 500 mg BID   Code Status: full code  Family Communication: no family at bedside  Disposition Plan: Inpatient rehab consult - pending   Manson Passey, MD  [facility]  Pager [phone]   Consultants:  PCCM transfer to [facility] starting 07/25/2012  Inpatient rehab Procedures:  Intubation in ED on admission Antibiotics:  Flagyl   If 7PM-7AM, please contact night-coverage [email or web address] Password [facility] 07/26/2012, 12:13 PM   LOS: 8 days    HPI/Subjective: No acute overnight events.  Objective: Filed Vitals:   07/25/12 2300 07/26/12 0506 07/26/12 0920 07/26/12 1020  BP:  122/63  137/69  Pulse:  63 84 73  Temp:  98.8 F (37.1 C)  98.1 F (36.7 C)  TempSrc:  Oral  Oral  Resp:  20  20  Height:      Weight: 85.957 kg (189 lb 8 oz)     SpO2:  94% 95% 95%    Intake/Output Summary (Last  24 hours) at 07/26/12 1213 Last data filed at 07/26/12 0900  Gross per 24 hour  Intake    358 ml  Output    200 ml  Net    158 ml    Exam:   General:  Pt is alert, follows commands appropriately, not in acute distress  Cardiovascular: Regular rate and rhythm, S1/S2 appreciated  Respiratory: Clear to auscultation bilaterally, no wheezing, no crackles, no rhonchi  Abdomen: Soft, non tender, non distended, bowel sounds present, no guarding  Extremities: No edema, pulses DP and PT palpable bilaterally  Neuro: Grossly nonfocal  Data Reviewed: Basic Metabolic Panel:  Recent Labs Lab 07/21/12 0500  07/22/12 0410 07/23/12 0420 07/24/12 0600 07/25/12 0445 07/26/12 0555  NA 141  < > 144 143 139 133* 129*  K 3.8  < > 3.2* 3.0* 4.2 4.5 3.9  CL 108  < > 114* 112 109 103 99  CO2 18*  < > 16* 18* 19 21 21   GLUCOSE 209*  < > 197* 93 131* 184* 102*  BUN 51*  < > 55* 46* 37* 29* 23  CREATININE 1.80*  < > 1.59* 1.29* 1.18* 1.12* 1.09  CALCIUM 7.9*  < > 7.6* 7.9* 8.0* 7.8* 7.6*  MG 2.0  --  1.9  --  1.7  --   --   PHOS 5.3*  --  4.0  --  1.9*  --   --   < > = values in this interval not displayed. Liver Function Tests:  Recent Labs Lab 07/21/12 0500  ALBUMIN 2.8*   No results found for this basename: LIPASE, AMYLASE,  in the last 168 hours No results found for this basename: AMMONIA,  in the last 168 hours CBC:  Recent Labs Lab 07/21/12 0500 07/22/12 0410 07/25/12 0445  WBC 8.1 9.5 20.2*  NEUTROABS  --  8.4*  --   HGB 8.3* 8.3* 8.5*  HCT 25.3* 25.6* 24.8*  MCV 82.7 83.4 80.8  PLT 257 219 210   Cardiac Enzymes: No results found for this basename: CKTOTAL, CKMB, CKMBINDEX, TROPONINI,  in the last 168 hours BNP: No components found with this basename: POCBNP,  CBG:  Recent Labs Lab 07/25/12 2009 07/26/12 0010 07/26/12 0411 07/26/12 0744 07/26/12 1126  GLUCAP 183* 205* 112* 106* 135*    Recent Results (from the past 240 hour(s))  MRSA PCR SCREENING      Status: None   Collection Time    07/18/12 11:17 AM      Result Value Range Status   MRSA by PCR NEGATIVE  NEGATIVE Final   Comment:            The GeneXpert MRSA Assay (FDA     approved for NASAL specimens     only), is one component of a     comprehensive MRSA colonization     surveillance program. It is not     intended to diagnose MRSA     infection nor to guide or     monitor treatment for     MRSA infections.  CULTURE, RESPIRATORY (NON-EXPECTORATED)     Status: None   Collection Time    07/18/12 12:13 PM      Result Value Range Status   Specimen Description TRACHEAL ASPIRATE   Final   Special Requests Normal   Final   Gram Stain     Final   Value: FEW WBC PRESENT,BOTH PMN AND MONONUCLEAR     RARE SQUAMOUS EPITHELIAL CELLS PRESENT     NO ORGANISMS SEEN   Culture Non-Pathogenic Oropharyngeal-type Flora Isolated.   Final   Report Status 07/20/2012 FINAL   Final  CLOSTRIDIUM DIFFICILE BY PCR     Status: None   Collection Time    07/19/12  6:48 PM      Result Value Range Status   C difficile by pcr NEGATIVE  NEGATIVE Final     Studies: No results found.  Scheduled Meds: . antiseptic oral rinse  15 mL Mouth Rinse QID  . feeding supplement  237 mL Oral TID BM  . heparin  5,000 Units Subcutaneous Q8H  . insulin aspart  0-20 Units Subcutaneous Q4H  . levETIRAcetam  500 mg Oral BID  . metroNIDAZOLE  500 mg Oral Q8H   Continuous Infusions: . sodium chloride 20 mL/hr at 07/22/12 0757

## 2012-07-26 NOTE — Care Management Note (Signed)
   CARE MANAGEMENT NOTE 07/26/2012  Patient:  Glenda Gonzalez, Glenda Gonzalez   Account Number:  [id number]  Date Initiated:  07/19/2012  Documentation initiated by:  [facility]  Subjective/Objective Assessment:   resp failure - nasally intubated.     Action/Plan:   CM contacted [facility] to resume HH services.   Anticipated DC Date:  07/26/2012   Anticipated DC Plan:  HOME W HOME HEALTH SERVICES      DC Planning Services  CM consult      Choice offered to / List presented to:          [facility] arranged  HH-1 RN  HH-2 PT  HH-3 OT      [facility] agency  [facility]   Status of service:  Completed, signed off Medicare Important Message given?   (If response is "NO", the following Medicare IM given date fields will be blank) Date Medicare IM given:   Date Additional Medicare IM given:    Discharge Disposition:  HOME W HOME HEALTH SERVICES  Per UR Regulation:  Reviewed for med. necessity/level of care/duration of stay  If discussed at Long Length of Stay Meetings, dates discussed:    Comments:  ContactHanaa, Glenda Gonzalez [phone]                 Glenda Gonzalez, Glenda Gonzalez [phone]  07/26/2012 Pt active with [facility] for [facility] needs. [facility] notified and will resume HHRN, HHPT, HHOT. Johny Shock RN MPH [phone]   07-19-12 9:22am Glenda Gonzalez, RNBSN [phone] Past admission patient was sent home with [facility] and PT.

## 2012-07-26 NOTE — Progress Notes (Signed)
Physical Therapy Treatment Patient Details Name: Glenda Gonzalez MRN: [id number] DOB: [birth year] Today's Date: 07/26/2012 Time: 4098-1191 PT Time Calculation (min): 23 min  PT Assessment / Plan / Recommendation Comments on Treatment Session  Patient globally weak.  Feel she would benefit from inpatient rehab to maximize her indepedence with mobility to return to prior level of assist with spouse help at home and help at her daughter's home during the day.    Follow Up Recommendations  CIR     Does the patient have the potential to tolerate intense rehabilitation   yes  Barriers to Discharge  None      Equipment Recommendations  None recommended by PT    Recommendations for Other Services  None  Frequency Min 3X/week   Plan Discharge plan remains appropriate    Precautions / Restrictions Precautions Precautions: Fall   Pertinent Vitals/Pain No complaints; HR 84; SpO2 95% with activity on room air    Mobility  Bed Mobility Rolling Right: 5: Supervision;With rail Right Sidelying to Sit: 3: Mod assist;HOB flat;With rails Sitting - Scoot to Edge of Bed: 3: Mod assist Details for Bed Mobility Assistance: cues for pushing up from bed to sit; scooted forward on pad under pt. Transfers Sit to Stand: 1: +2 Total assist;From bed;With upper extremity assist Sit to Stand: Patient Percentage: 50% Stand to Sit: With upper extremity assist;To chair/3-in-1;1: +2 Total assist Stand to Sit: Patient Percentage: 60% Details for Transfer Assistance: cues for hand placement, stood from higher bed; sat in lower chair; lifting assist to stand and lowering help to sit safely Ambulation/Gait Ambulation/Gait Assistance: 3: Mod assist Ambulation Distance (Feet): 8 Feet Assistive device: Rolling walker Ambulation/Gait Assistance Details: fearful of falling, cues to continue until met goal for distance, cues for step length, posture Gait Pattern: Step-through pattern;Decreased stride  length;Shuffle;Trunk flexed      PT Goals Acute Rehab PT Goals Pt will go Supine/Side to Sit: with supervision;with HOB 0 degrees PT Goal: Supine/Side to Sit - Progress: Progressing toward goal Pt will go Sit to Stand: with supervision;with upper extremity assist PT Goal: Sit to Stand - Progress: Progressing toward goal Pt will go Stand to Sit: with supervision;with upper extremity assist PT Goal: Stand to Sit - Progress: Progressing toward goal Pt will Ambulate: 16 - 50 feet;with supervision;with rolling walker PT Goal: Ambulate - Progress: Progressing toward goal  Visit Information  Last PT Received On: 07/26/12    Subjective Data  Subjective: States doesn't know how long since she walked; but was able to dress herself prior to admission, but husband helped her bathe.  Been married 55 years!   Cognition  Cognition Overall Cognitive Status: History of cognitive impairments - at baseline Arousal/Alertness: Awake/alert Orientation Level: Appears intact for tasks assessed Behavior During Session: [facility] for tasks performed Cognition - Other Comments: reports has hard time remembering things    Balance  Static Sitting Balance Static Sitting - Balance Support: Feet unsupported;Bilateral upper extremity supported Static Sitting - Level of Assistance: 4: Min assist Static Standing Balance Static Standing - Balance Support: Bilateral upper extremity supported Static Standing - Level of Assistance: 4: Min assist  End of Session PT - End of Session Equipment Utilized During Treatment: Gait belt Activity Tolerance: Patient limited by fatigue Patient left: with call bell/phone within reach;in chair;with nursing in room (feet propped on pillow)   GP     WYNN,CYNDI 07/26/2012, 10:11 AM Sheran Lawless, PT [phone] 07/26/2012

## 2012-07-26 NOTE — Evaluation (Signed)
Occupational Therapy Evaluation Patient Details Name: Glenda Gonzalez MRN: [id number] DOB: 04/08/1934 Today's Date: 07/26/2012 Time: 2440-1027 OT Time Calculation (min): 23 min  OT Assessment / Plan / Recommendation Clinical Impression  This 79 yo female admitted with reaction to medication, intubated, and now extubated presents to acute OT with problems below. Will benefit from acute OT with follow up on CIR.    OT Assessment  Patient needs continued OT Services    Follow Up Recommendations  CIR    Barriers to Discharge None    Equipment Recommendations  None recommended by OT       Frequency  Min 2X/week    Precautions / Restrictions Precautions Precautions: Fall       ADL  Eating/Feeding: Simulated;Set up Where Assessed - Eating/Feeding: Bed level Grooming: Simulated;Set up;Supervision/safety Where Assessed - Grooming: Supported sitting Upper Body Bathing: Simulated;Minimal assistance Where Assessed - Upper Body Bathing: Supported sitting Lower Body Bathing: Simulated;Maximal assistance Where Assessed - Lower Body Bathing: Supported sit to stand Upper Body Dressing: Simulated;Moderate assistance Where Assessed - Upper Body Dressing: Supported sitting Lower Body Dressing: Simulated;+1 Total assistance Where Assessed - Lower Body Dressing: Supported sit to Pharmacist, hospital: Simulated;Moderate assistance Toilet Transfer Method: Sit to Barista:  (Bed--8 feet to recliner) Toileting - Clothing Manipulation and Hygiene: Simulated;Moderate assistance Where Assessed - Toileting Clothing Manipulation and Hygiene: Sit to stand from 3-in-1 or toilet Equipment Used: Rolling walker;Gait belt Transfers/Ambulation Related to ADLs: Total A +2 (pt=50%) sit to stand and stand to sit, Mod A for ambulation  ADL Comments: DOE, however vital signs were stable    OT Diagnosis: Generalized weakness  OT Problem List: Decreased strength;Decreased activity  tolerance;Impaired balance (sitting and/or standing) OT Treatment Interventions: Self-care/ADL training;Therapeutic activities;DME and/or AE instruction;Patient/family education;Balance training   OT Goals Acute Rehab OT Goals OT Goal Formulation: With patient Time For Goal Achievement: 08/09/12 Potential to Achieve Goals: Good ADL Goals Pt Will Perform Grooming: with set-up;with supervision;Standing at sink;Unsupported ADL Goal: Grooming - Progress: Goal set today Pt Will Perform Upper Body Dressing: with set-up;with supervision;Unsupported;Sitting, bed ADL Goal: Upper Body Dressing - Progress: Goal set today Pt Will Perform Lower Body Dressing: with min assist;Sit to stand from chair;Sit to stand from bed;Supported ADL Goal: Lower Body Dressing - Progress: Goal set today Pt Will Transfer to Toilet: with min assist;with DME;Ambulation;3-in-1;Comfort height toilet;Grab bars ADL Goal: Toilet Transfer - Progress: Goal set today Pt Will Perform Toileting - Clothing Manipulation: with min assist;Standing ADL Goal: Toileting - Clothing Manipulation - Progress: Goal set today Pt Will Perform Toileting - Hygiene: with min assist;Sit to stand from 3-in-1/toilet ADL Goal: Toileting - Hygiene - Progress: Goal set today Miscellaneous OT Goals Miscellaneous OT Goal #1: Pt will be S to get up to EOB and back in bed for BADLs OT Goal: Miscellaneous Goal #1 - Progress: Goal set today  Visit Information  Last OT Received On: 07/26/12    Subjective Data  Subjective: I was dressing myself before I came in here   Prior Functioning     Home Living Lives With: Spouse;Son Available Help at Discharge: Family Type of Home: House Home Access: Ramped entrance;Stairs to enter [employer] of Steps: 5 steps with rail at [employer], ramp at pts house Entrance Stairs-Rails: Right;Left;Can reach both Home Layout: One level Bathroom Shower/Tub:  (takes sponge baths at bedside) Bathroom Toilet:  Standard Home Adaptive Equipment: Bedside commode/3-in-1;Walker - rolling;Straight cane;Wheelchair - manual Additional Comments: spouse works and dgtr picks up  pt and takes her to dgtr's house each day until spouse gets off work and picks up pt.  Prior Function Level of Independence: Needs assistance Needs Assistance: Gait;Bathing;Toileting;Meal Prep;Light Housekeeping Bath: Moderate Toileting: Minimal Meal Prep: Total Light Housekeeping: Total Gait Assistance: with cane, WC for long distance Able to Take Stairs?: No Driving: No Vocation: Retired Comments: Pt walks with cane and can get OOB to toliet on her own, but requres A for pericare. Communication Communication: No difficulties Dominant Hand: Right         Vision/Perception Vision - History Baseline Vision: No visual deficits Visual History: Glaucoma   Cognition  Cognition Overall Cognitive Status: History of cognitive impairments - at baseline Arousal/Alertness: Awake/alert Orientation Level: Appears intact for tasks assessed Behavior During Session: [facility] for tasks performed Cognition - Other Comments: reports has hard time remembering things    Extremity/Trunk Assessment Right Upper Extremity Assessment RUE ROM/Strength/Tone:  (grossly 3/5) Left Upper Extremity Assessment LUE ROM/Strength/Tone:  (grossly 3/5)     Mobility Bed Mobility Rolling Right: 5: Supervision;With rail Right Sidelying to Sit: 3: Mod assist;HOB flat;With rails Sitting - Scoot to Edge of Bed: 3: Mod assist Details for Bed Mobility Assistance: cues for pushing up from bed to sit; scooted forward on pad under pt. Transfers Sit to Stand: 1: +2 Total assist;From bed;With upper extremity assist Sit to Stand: Patient Percentage: 50% Stand to Sit: With upper extremity assist;To chair/3-in-1;1: +2 Total assist Stand to Sit: Patient Percentage: 60% Details for Transfer Assistance: cues for hand placement, stood from higher bed; sat in lower chair;  lifting assist to stand and lowering help to sit safely        Balance Static Sitting Balance Static Sitting - Balance Support: Feet unsupported;Bilateral upper extremity supported Static Sitting - Level of Assistance: 4: Min assist Static Standing Balance Static Standing - Balance Support: Bilateral upper extremity supported Static Standing - Level of Assistance: 4: Min assist   End of Session OT - End of Session Equipment Utilized During Treatment: Gait belt (RW) Activity Tolerance: Patient tolerated treatment well Patient left: in chair Nurse Communication:  (NT in room watching [place] ambulate with the pt)       [name] [phone] 07/26/2012, 10:29 AM

## 2012-07-27 LAB — BASIC METABOLIC PANEL
BUN: 23 mg/dL (ref 6–23)
Calcium: 8 mg/dL — ABNORMAL LOW (ref 8.4–10.5)
GFR calc Af Amer: 58 mL/min — ABNORMAL LOW (ref >90)
GFR calc non Af Amer: 50 mL/min — ABNORMAL LOW (ref >90)
Potassium: 4.2 mEq/L (ref 3.5–5.1)

## 2012-07-27 LAB — GLUCOSE, CAPILLARY
Glucose-Capillary: 93 mg/dL (ref 70–99)
Glucose-Capillary: 114 mg/dL — ABNORMAL HIGH (ref 70–99)
Glucose-Capillary: 121 mg/dL — ABNORMAL HIGH (ref 70–99)

## 2012-07-27 MED ORDER — OXYCODONE HCL 5 MG PO TABS: 5.0000 mg | ORAL_TABLET | Freq: Four times a day (QID) | ORAL | Status: DC | PRN

## 2012-07-27 MED ORDER — METRONIDAZOLE 500 MG PO TABS: 500.0000 mg | ORAL_TABLET | Freq: Three times a day (TID) | ORAL | Status: DC

## 2012-07-27 NOTE — Discharge Summary (Addendum)
Physician Discharge Summary  Glenda Gonzalez:[id number] DOB: March 08, 1934 DOA: 07/18/2012  PCP: Billee Cashing, MD  Admit date: 07/18/2012 Discharge date: 07/27/2012  Recommendations for Outpatient Follow-up:  1. Please follow up with PCP in 1-2 weeks post discharge if symptoms come back 2. Stop taking ACEi which have caused you to have angioedema.   Discharge Diagnoses:  Principal Problem:   ACE inhibitor-aggravated angioedema Active Problems:   DM   DEMENTIA, MILD   HYPERTENSION   C. difficile colitis   CVA (cerebral infarction)   ARF (acute renal failure)   Acute respiratory failure with hypoxia   Anemia of chronic disease  Discharge Condition: medically stable for discharge home today with HHPT/OT  Diet recommendation: as tolerated  History of present illness:  79 year old [name] just discharged from the hospital several days prior to this admission returns with acute angioedema likely due to ACE inhibitor use. The patient was intubated nasally in the emergency room by anesthesia. Patient was initially under PCCM care and now transferred to [facility] for further management as patient is extubated.  Patient has clinically improved and is stable for discharge home today.  Assessment/Plan:   Principal Problem:  *ACE inhibitor-aggravated angioedema  Extubated, protecting airways Active Problems:  DM   Continue sliding scale insulin   CBG under good control while in hospital HYPERTENSION  Hydralazine prn  C. difficile colitis  Continue flagyl for 7 more days on discharge ARF (acute renal failure)  Secondary to ACEi  Renal function improving; now WNL Anemia of chronic disease  Likely secondary to history of diabetes  No signs of active bleed  Hemoglobin stable at 8.5 Seizures  Switched to PO Keppra 500 mg BID   Code Status: full code  Family Communication: no family at bedside  Disposition Plan: Inpatient rehab consult - pt denied rehab  admission; refuses SNF placement; prefers to go home with HHPT/OT   Manson Passey, MD  [facility]  Pager [phone]    Consultants:  PCCM transfer to [facility] starting 07/25/2012  Inpatient rehab Procedures:  Intubation in ED on admission Antibiotics:  Flagyl - continue for 7 more days on discharge   Discharge Exam: Filed Vitals:   07/27/12 0444  BP: 164/97  Pulse: 66  Temp: 98.1 F (36.7 C)  Resp: 20   Filed Vitals:   07/26/12 1417 07/26/12 1827 07/26/12 2012 07/27/12 0444  BP: 171/79 161/71 152/70 164/97  Pulse: 72 70 70 66  Temp: 97.6 F (36.4 C) 98.9 F (37.2 C) 98.1 F (36.7 C) 98.1 F (36.7 C)  TempSrc: Oral  Oral Oral  Resp: 18 18 20 20   Height:   5\' 2"  (1.575 m)   Weight:   83.643 kg (184 lb 6.4 oz)   SpO2: 99% 97% 99% 98%    General: Pt is alert, follows commands appropriately, not in acute distress Cardiovascular: Regular rate and rhythm, S1/S2 +, no murmurs, no rubs, no gallops Respiratory: Clear to auscultation bilaterally, no wheezing, no crackles, no rhonchi Abdominal: Soft, non tender, non distended, bowel sounds +, no guarding Extremities: no edema, no cyanosis, pulses palpable bilaterally DP and PT Neuro: Grossly nonfocal  Discharge Instructions  Discharge Orders   Future Orders Complete By Expires     Call MD for:  difficulty breathing, headache or visual disturbances  As directed     Call MD for:  difficulty breathing, headache or visual disturbances  As directed     Call MD for:  persistant dizziness or light-headedness  As directed  Call MD for:  persistant dizziness or light-headedness  As directed     Call MD for:  persistant nausea and vomiting  As directed     Call MD for:  persistant nausea and vomiting  As directed     Call MD for:  severe uncontrolled pain  As directed     Call MD for:  severe uncontrolled pain  As directed     Diet - low sodium heart healthy  As directed     Diet - low sodium heart healthy  As directed     Discharge  instructions  As directed     Comments:      Continue flagyl for 7 more days.    Increase activity slowly  As directed     Increase activity slowly  As directed         Medication List    STOP taking these medications       benazepril 5 MG tablet  Commonly known as:  LOTENSIN  Take 5 mg by mouth daily.       vancomycin 50 mg/mL oral solution  Commonly known as:  VANCOCIN      TAKE these medications       aspirin 81 MG chewable tablet  Chew 81 mg by mouth daily.        bimatoprost 0.01 % Soln  Commonly known as:  LUMIGAN  Apply 1 drop to eye 2 (two) times daily.     donepezil 10 MG tablet  Commonly known as:  ARICEPT  Take 10 mg by mouth at bedtime.     glimepiride 2 MG tablet  Commonly known as:  AMARYL  Take 2 mg by mouth daily before breakfast.     labetalol 100 MG tablet  Commonly known as:  NORMODYNE  Take 100 mg by mouth 2 (two) times daily.     levETIRAcetam 500 MG tablet  Commonly known as:  KEPPRA  Take 500 mg by mouth 2 (two) times daily.     metroNIDAZOLE 500 MG tablet  Commonly known as:  FLAGYL  Take 1 tablet (500 mg total) by mouth every 8 (eight) hours.     oxyCODONE 5 MG immediate release tablet  Commonly known as:  Oxy IR/ROXICODONE  Take 1 tablet (5 mg total) by mouth every 6 (six) hours as needed.     pregabalin 50 MG capsule  Commonly known as:  LYRICA  Take 50 mg by mouth 3 (three) times daily.     senna 8.6 MG Tabs  Commonly known as:  SENOKOT  Take 1 tablet by mouth at bedtime.     SYSTANE ULTRA OP  Place 1 drop into both eyes daily as needed. For dry eyes     zolpidem 10 MG tablet  Commonly known as:  AMBIEN  Take 10 mg by mouth at bedtime as needed for sleep.           Follow-up Information   Follow up with Billee Cashing, MD In 2 weeks.   Contact informationGinette [place] [address] [phone]        The results of significant diagnostics from this hospitalization (including imaging, microbiology,  ancillary and laboratory) are listed below for reference.    Significant Diagnostic Studies: Dg Abd 1 View  07/13/2012  *RADIOLOGY REPORT*  Clinical Data: Abdominal discomfort.  ABDOMEN - 1 VIEW  Comparison: C T 07/09/2012  Findings: A nonobstructive bowel gas pattern.  No free air, organomegaly or suspicious calcification.  No acute bony abnormality.  IMPRESSION: No acute findings.   Original Report Authenticated By: Charlett Nose, M.D.    Ct Abdomen Pelvis W Contrast  07/09/2012  *RADIOLOGY REPORT*  Clinical Data: 79 year old female with left-sided abdominal and pelvic pain.  CT ABDOMEN AND PELVIS WITH CONTRAST  Technique:  Multidetector CT imaging of the abdomen and pelvis was performed following the standard protocol during bolus administration of intravenous contrast.  Contrast: 80mL OMNIPAQUE IOHEXOL 300 MG/ML  SOLN  Comparison: 03/08/2012 CT  Findings: There is diffuse circumferential wall thickening of a large segment of mid small bowel, primarily within the left and lower abdomen and pelvis.  A small amount of ascites is noted. There is no evidence of pneumoperitoneum, bowel obstruction or pneumatosis intestinalis. Omental stranding in the left abdomen noted and may be secondary to the enteritis.  The liver, spleen, adrenal glands, pancreas and gallbladder are unremarkable. Bilateral renal cortical atrophy and left renal scarring noted.  There is no evidence of biliary dilatation, enlarged lymph nodes or abdominal aortic aneurysm.  The visualized mesenteric arteries are patent.  Hysterectomy and pessary again noted. A curvilinear density within the sigmoid colon is unchanged. Postoperative changes within the anterior abdominal/pelvic wall again noted.  No acute or suspicious bony abnormalities are noted.  IMPRESSION: Diffuse long segment circumferential small bowel wall thickening and adjacent inflammation with small amount of ascites - likely inflammatory or infectious enteritis. No evidence of bowel  obstruction or pneumoperitoneum.  Renal atrophy and left renal scarring.   Original Report Authenticated By: Harmon Pier, M.D.    Dg Chest Port 1 View  07/22/2012  *RADIOLOGY REPORT*  Clinical Data: Shortness of breath.  Assess endotracheal tube.  PORTABLE CHEST - 1 VIEW  Comparison: 07/19/2012  Findings: Endotracheal tube tip measures 3.2 cm above the carina. Since the previous study, enteric tube has been placed.  The tip is at the field of view but below the left hemidiaphragm consistent with location at or below the stomach.  Left central venous catheter is unchanged in position.  Shallow inspiration.  Mild cardiac enlargement.  Increased density in the right base suggest infiltration or atelectasis.  No blunting of costophrenic angles. No pneumothorax.  No significant change since previous study.  IMPRESSION: Interval placement enteric tube with tip below the left hemidiaphragm.  Otherwise stable appearance of the chest.  Mild cardiac enlargement.  Infiltration or atelectasis in the right lung base.   Original Report Authenticated By: Burman Nieves, M.D.    Portable Chest Xray In Am  07/19/2012  *RADIOLOGY REPORT*  Clinical Data: Acute respiratory failure on ventilator.  Renal failure and congestive heart failure.  PORTABLE CHEST - 1 VIEW  Comparison: 07/18/2012  Findings: Endotracheal tube and left internal jugular center venous catheter remain in appropriate position.  Both lungs are clear. Heart size remains stable.  IMPRESSION: Stable exam.  No acute findings.   Original Report Authenticated By: Myles Rosenthal, M.D.    Dg Chest Portable 1 View  07/18/2012  *RADIOLOGY REPORT*  Clinical Data: Central line placement  PORTABLE CHEST - 1 VIEW  Comparison: Same day  Findings: The left internal jugular central line has its tip in the SVC 3 cm above the right atrium.  No pneumothorax.  Endotracheal tube is unchanged with its tip 4.5 cm above the carina.  Lungs are well aerated.  IMPRESSION: Central line well  positioned with its tip in the SV 3 cm above the right atrium.  No complication related to line placement.   Original  Report Authenticated By: Paulina Fusi, M.D.    Portable Chest Xray To Verify Ett/ Central Line Placement  07/18/2012  *RADIOLOGY REPORT*  Clinical Data: Central line and endotracheal tube placement.  PORTABLE CHEST - 1 VIEW  Comparison: Earlier in the morning at 0729 hours.  Findings: Endotracheal tube terminates at the level of the clavicles, 4.8 cm above carina.  No central line is seen. Numerous leads and wires project over the chest.  Mild cardiomegaly. No pleural effusion or pneumothorax.  Improved right base aeration.  IMPRESSION: Improved right base aeration, likely representing resolving atelectasis since earlier in the morning.  Stable position of endotracheal tube; no central line is seen.  Cardiomegaly without congestive failure.   Original Report Authenticated By: Jeronimo Greaves, M.D.    Dg Chest Port 1 View  07/18/2012  *RADIOLOGY REPORT*  Clinical Data: Respiratory failure and intubation.  PORTABLE CHEST - 1 VIEW  Comparison: 06/12/2012  Findings: Endotracheal tube present with the tip approximately 3.5 cm above the carina.  Lungs show atelectasis versus infiltrate in the right lower lung.  No pulmonary edema.  Stable cardiomegaly. No pleural effusions identified.  IMPRESSION: Satisfactory positioning of endotracheal tube.  Atelectasis versus infiltrate at the right lung base.   Original Report Authenticated By: Irish Lack, M.D.     Microbiology: Recent Results (from the past 240 hour(s))  MRSA PCR SCREENING     Status: None   Collection Time    07/18/12 11:17 AM      Result Value Range Status   MRSA by PCR NEGATIVE  NEGATIVE Final   Comment:            The GeneXpert MRSA Assay (FDA     approved for NASAL specimens     only), is one component of a     comprehensive MRSA colonization     surveillance program. It is not     intended to diagnose MRSA     infection  nor to guide or     monitor treatment for     MRSA infections.  CULTURE, RESPIRATORY (NON-EXPECTORATED)     Status: None   Collection Time    07/18/12 12:13 PM      Result Value Range Status   Specimen Description TRACHEAL ASPIRATE   Final   Special Requests Normal   Final   Gram Stain     Final   Value: FEW WBC PRESENT,BOTH PMN AND MONONUCLEAR     RARE SQUAMOUS EPITHELIAL CELLS PRESENT     NO ORGANISMS SEEN   Culture Non-Pathogenic Oropharyngeal-type Flora Isolated.   Final   Report Status 07/20/2012 FINAL   Final  CLOSTRIDIUM DIFFICILE BY PCR     Status: None   Collection Time    07/19/12  6:48 PM      Result Value Range Status   C difficile by pcr NEGATIVE  NEGATIVE Final     Labs: Basic Metabolic Panel:  Recent Labs Lab 07/21/12 0500  07/22/12 0410 07/23/12 0420 07/24/12 0600 07/25/12 0445 07/26/12 0555 07/27/12 0605  NA 141  < > 144 143 139 133* 129* 133*  K 3.8  < > 3.2* 3.0* 4.2 4.5 3.9 4.2  CL 108  < > 114* 112 109 103 99 101  CO2 18*  < > 16* 18* 19 21 21 23   GLUCOSE 209*  < > 197* 93 131* 184* 102* 100*  BUN 51*  < > 55* 46* 37* 29* 23 23  CREATININE 1.80*  < > 1.59* 1.29*  1.18* 1.12* 1.09 1.06  CALCIUM 7.9*  < > 7.6* 7.9* 8.0* 7.8* 7.6* 8.0*  MG 2.0  --  1.9  --  1.7  --   --   --   PHOS 5.3*  --  4.0  --  1.9*  --   --   --   < > = values in this interval not displayed. Liver Function Tests:  Recent Labs Lab 07/21/12 0500  ALBUMIN 2.8*   No results found for this basename: LIPASE, AMYLASE,  in the last 168 hours No results found for this basename: AMMONIA,  in the last 168 hours CBC:  Recent Labs Lab 07/21/12 0500 07/22/12 0410 07/25/12 0445  WBC 8.1 9.5 20.2*  NEUTROABS  --  8.4*  --   HGB 8.3* 8.3* 8.5*  HCT 25.3* 25.6* 24.8*  MCV 82.7 83.4 80.8  PLT 257 219 210   Cardiac Enzymes: No results found for this basename: CKTOTAL, CKMB, CKMBINDEX, TROPONINI,  in the last 168 hours BNP: BNP (last 3 results)  Recent Labs  06/08/12 2309  07/22/12 0410  PROBNP 234.3 979.7*   CBG:  Recent Labs Lab 07/26/12 1126 07/26/12 1617 07/26/12 2029 07/26/12 2349 07/27/12 0423  GLUCAP 135* 144* 161* 216* 93    Time coordinating discharge: Over 30 minutes  Signed:  Manson Passey, MD  [facility]  07/27/2012, 7:42 AM  Pager #: [phone]

## 2012-07-27 NOTE — Progress Notes (Signed)
Pt discharged to home after visit summary reviewed and pt and husband who were both capable of re verbalizing medications and follow up appointments. Pt remains stable. No signs and symptoms of distress. Educated to return to ER in the event of SOB, dizziness, chest pain, or fainting. Laverda Sorenson, RN

## 2012-08-05 ENCOUNTER — Telehealth: Payer: Self-pay

## 2012-08-05 NOTE — Telephone Encounter (Signed)
Glenda [facility] with [facility] wanted to make Glenda Gonzalez aware that patients BP was 148/102 and 146/108 laying down.  Patient denies chest pain.  Glenda Gonzalez has also contacted other Glenda Gonzalez.

## 2012-09-19 ENCOUNTER — Emergency Department (HOSPITAL_COMMUNITY): Payer: Medicare Other

## 2012-09-19 ENCOUNTER — Encounter (HOSPITAL_COMMUNITY): Payer: Self-pay | Admitting: Adult Health

## 2012-09-19 ENCOUNTER — Emergency Department (HOSPITAL_COMMUNITY)
Admission: EM | Admit: 2012-09-19 | Discharge: 2012-09-20 | Disposition: A | Discharge status: Home / Self Care | Payer: Medicare Other | Attending: Emergency Medicine | Admitting: Emergency Medicine

## 2012-09-19 DIAGNOSIS — M7989 Other specified soft tissue disorders: Secondary | ICD-10-CM | POA: Insufficient documentation

## 2012-09-19 DIAGNOSIS — R0989 Other specified symptoms and signs involving the circulatory and respiratory systems: Secondary | ICD-10-CM | POA: Insufficient documentation

## 2012-09-19 DIAGNOSIS — I10 Essential (primary) hypertension: Secondary | ICD-10-CM | POA: Insufficient documentation

## 2012-09-19 DIAGNOSIS — R079 Chest pain, unspecified: Secondary | ICD-10-CM | POA: Insufficient documentation

## 2012-09-19 DIAGNOSIS — R110 Nausea: Secondary | ICD-10-CM | POA: Insufficient documentation

## 2012-09-19 DIAGNOSIS — R569 Unspecified convulsions: Secondary | ICD-10-CM | POA: Insufficient documentation

## 2012-09-19 DIAGNOSIS — Z87891 Personal history of nicotine dependence: Secondary | ICD-10-CM | POA: Insufficient documentation

## 2012-09-19 DIAGNOSIS — F0390 Unspecified dementia without behavioral disturbance: Secondary | ICD-10-CM | POA: Insufficient documentation

## 2012-09-19 DIAGNOSIS — G250 Essential tremor: Secondary | ICD-10-CM | POA: Insufficient documentation

## 2012-09-19 DIAGNOSIS — R0600 Dyspnea, unspecified: Secondary | ICD-10-CM

## 2012-09-19 DIAGNOSIS — K219 Gastro-esophageal reflux disease without esophagitis: Secondary | ICD-10-CM | POA: Insufficient documentation

## 2012-09-19 DIAGNOSIS — Z885 Allergy status to narcotic agent status: Secondary | ICD-10-CM | POA: Insufficient documentation

## 2012-09-19 DIAGNOSIS — J309 Allergic rhinitis, unspecified: Secondary | ICD-10-CM | POA: Insufficient documentation

## 2012-09-19 DIAGNOSIS — I2510 Atherosclerotic heart disease of native coronary artery without angina pectoris: Secondary | ICD-10-CM | POA: Insufficient documentation

## 2012-09-19 DIAGNOSIS — E119 Type 2 diabetes mellitus without complications: Secondary | ICD-10-CM | POA: Insufficient documentation

## 2012-09-19 DIAGNOSIS — Z8673 Personal history of transient ischemic attack (TIA), and cerebral infarction without residual deficits: Secondary | ICD-10-CM | POA: Insufficient documentation

## 2012-09-19 DIAGNOSIS — I509 Heart failure, unspecified: Secondary | ICD-10-CM | POA: Insufficient documentation

## 2012-09-19 DIAGNOSIS — Z880 Allergy status to penicillin: Secondary | ICD-10-CM | POA: Insufficient documentation

## 2012-09-19 DIAGNOSIS — R0609 Other forms of dyspnea: Secondary | ICD-10-CM | POA: Insufficient documentation

## 2012-09-19 DIAGNOSIS — J302 Other seasonal allergic rhinitis: Secondary | ICD-10-CM

## 2012-09-19 DIAGNOSIS — Z8719 Personal history of other diseases of the digestive system: Secondary | ICD-10-CM | POA: Insufficient documentation

## 2012-09-19 DIAGNOSIS — M109 Gout, unspecified: Secondary | ICD-10-CM | POA: Insufficient documentation

## 2012-09-19 LAB — PRO B NATRIURETIC PEPTIDE: Pro B Natriuretic peptide (BNP): 257.8 pg/mL (ref 0–450)

## 2012-09-19 LAB — CBC
Hemoglobin: 10.2 g/dL — ABNORMAL LOW (ref 12.0–15.0)
MCHC: 32.4 g/dL (ref 30.0–36.0)
Platelets: 244 10*3/uL (ref 150–400)
RBC: 3.97 MIL/uL (ref 3.87–5.11)

## 2012-09-19 LAB — BASIC METABOLIC PANEL
Calcium: 9 mg/dL (ref 8.4–10.5)
GFR calc non Af Amer: 40 mL/min — ABNORMAL LOW (ref >90)
Glucose, Bld: 254 mg/dL — ABNORMAL HIGH (ref 70–99)
Potassium: 3.6 mEq/L (ref 3.5–5.1)
Sodium: 137 mEq/L (ref 135–145)

## 2012-09-19 NOTE — ED Notes (Signed)
Pt c/o of room spinning every time she turns on her left side

## 2012-09-19 NOTE — ED Notes (Signed)
Presents with SOB, Left sided chest pain that has subsided and hypertension BP 220s/110s. Pt is anxious and c/o slight sob. Ralesi ausculted bilaterally. Pt denies head ache at this time. Pt states, "it feels like my body wants to pass out" pt is speaking in short phrases.

## 2012-09-19 NOTE — ED Notes (Signed)
Pt states CP resolved

## 2012-09-19 NOTE — ED Provider Notes (Signed)
History     CSN: [id number] Arrival date & time 09/19/12  2008 First MD Initiated Contact with Patient 09/19/12 2055      Chief Complaint  Patient presents with  . Shortness of Breath    HPI Pt states she has been having trouble with her breathing off and on for the last 3-4 days.  She has not been coughing.  She does feel like she has a mucus in her lungs though.  No complains of fever.  She had a little pain under her left breast this morning that lasted for a brief period of time but she did not think much of it.  This evening she started to feel worse and became nauseated so she called 911. She has noticed some leg swelling recently.  Her symptoms do increase when she is lying flat.  Past Medical History  Diagnosis Date  . Diabetes mellitus   . GERD (gastroesophageal reflux disease)   . Hypertension   . Diverticulitis   . Gout   . Essential and other specified forms of tremor   . Dementia   . CAD (coronary artery disease)   . CHF (congestive heart failure)     Diastolic dysfunction  . Stroke   . Seizures     r/t stroke    Past Surgical History  Procedure Laterality Date  . Dilation and curettage of uterus    . Tumor removal      Family History  Problem Relation Age of Onset  . Colon cancer Neg Hx   . Heart disease Father   . Heart disease Brother     History  Substance Use Topics  . Smoking status: Former Games developer  . Smokeless tobacco: Never Used  . Alcohol Use: No    OB History   Grav Para Term Preterm Abortions TAB SAB Ect Mult Living                  Review of Systems  All other systems reviewed and are negative.    Allergies  Ace inhibitors; Codeine; Fentanyl; Morphine; and Penicillins  Home Medications   Current Outpatient Rx  Name  Route  Sig  Dispense  Refill  . aspirin 81 MG chewable tablet   Oral   Chew 81 mg by mouth daily.         . bimatoprost (LUMIGAN) 0.01 % SOLN   Ophthalmic   Apply 1 drop to eye 2 (two) times daily.        Marland Kitchen donepezil (ARICEPT) 10 MG tablet   Oral   Take 10 mg by mouth at bedtime.         Marland Kitchen glimepiride (AMARYL) 2 MG tablet   Oral   Take 2 mg by mouth daily before breakfast.         . labetalol (NORMODYNE) 100 MG tablet   Oral   Take 100 mg by mouth 2 (two) times daily.         Marland Kitchen levETIRAcetam (KEPPRA) 500 MG tablet   Oral   Take 500 mg by mouth 2 (two) times daily.         Bertram Gala Glycol-Propyl Glycol (SYSTANE ULTRA OP)   Both Eyes   Place 1 drop into both eyes daily as needed. For dry eyes         . pregabalin (LYRICA) 50 MG capsule   Oral   Take 50 mg by mouth 3 (three) times daily.         Marland Kitchen  senna (SENOKOT) 8.6 MG TABS   Oral   Take 1 tablet by mouth at bedtime.         Marland Kitchen zolpidem (AMBIEN) 10 MG tablet   Oral   Take 10 mg by mouth at bedtime as needed for sleep.           BP 166/124  Pulse 93  Temp(Src) 97 F (36.1 C) (Oral)  Resp 20  SpO2 100%  Physical Exam  Nursing note and vitals reviewed. Constitutional: She appears well-developed and well-nourished. No distress.  HENT:  Head: Normocephalic and atraumatic.  Right Ear: External ear normal.  Left Ear: External ear normal.  Eyes: Conjunctivae are normal. Right eye exhibits no discharge. Left eye exhibits no discharge. No scleral icterus.  Neck: Neck supple. No tracheal deviation present.  Cardiovascular: Normal rate, regular rhythm and intact distal pulses.   Pulmonary/Chest: Effort normal and breath sounds normal. No stridor. No respiratory distress. She has no wheezes. She has no rales.  Abdominal: Soft. Bowel sounds are normal. She exhibits no distension. There is no tenderness. There is no rebound and no guarding.  Musculoskeletal: She exhibits edema (mild). She exhibits no tenderness.  Neurological: She is alert. She has normal strength. No sensory deficit. Cranial nerve deficit:  no gross defecits noted. She exhibits normal muscle tone. She displays no seizure activity.  Coordination normal.  Skin: Skin is warm and dry. No rash noted. She is not diaphoretic.  Psychiatric: She has a normal mood and affect.    ED Course  Procedures (including critical care time)  Labs Reviewed  CBC - Abnormal; Notable for the following:    Hemoglobin 10.2 (*)    HCT 31.5 (*)    MCH 25.7 (*)    RDW 18.9 (*)    All other components within normal limits  BASIC METABOLIC PANEL - Abnormal; Notable for the following:    Glucose, Bld 254 (*)    BUN 26 (*)    Creatinine, Ser 1.28 (*)    GFR calc non Af Amer 40 (*)    GFR calc Af Amer 46 (*)    All other components within normal limits  PRO B NATRIURETIC PEPTIDE  POCT I-STAT TROPONIN I   Dg Chest Port 1 View  09/19/2012  *RADIOLOGY REPORT*  Clinical Data: Short of breath.  Fall 1 day ago.  PORTABLE CHEST - 1 VIEW  Comparison: 07/22/2012.  Findings: Heart size upper limits of normal for projection. Monitoring leads are projected over the chest. No pneumothorax. Bilateral AC joint osteoarthritis.  Basilar atelectasis.  No pleural effusion.  No airspace disease.  No displaced rib fractures identified.  IMPRESSION: No acute cardiopulmonary disease.   Original Report Authenticated By: Andreas Newport, M.D.      1. Dyspnea   2. Seasonal allergies       MDM  The patient has no evidence of pneumonia.  No sign of CHF.  Repeat BP improved although elevated.  Questions whether some of the symptoms may be related to allergies.  She mentions mucus building up. Discussed importance of taking her meds.  Recc close outpatient follow up.        Celene Kras, MD 09/20/12 [phone]

## 2012-09-20 NOTE — ED Notes (Signed)
Pt states understanding of discharge instructions 

## 2012-09-30 ENCOUNTER — Other Ambulatory Visit: Payer: Self-pay

## 2012-10-04 ENCOUNTER — Encounter (HOSPITAL_COMMUNITY): Payer: Self-pay | Admitting: Emergency Medicine

## 2012-10-04 ENCOUNTER — Observation Stay (HOSPITAL_COMMUNITY)
Admission: EM | Admit: 2012-10-04 | Discharge: 2012-10-06 | Disposition: A | Discharge status: Home / Self Care | Payer: Medicare Other | Attending: Internal Medicine | Admitting: Internal Medicine

## 2012-10-04 DIAGNOSIS — R209 Unspecified disturbances of skin sensation: Secondary | ICD-10-CM | POA: Insufficient documentation

## 2012-10-04 DIAGNOSIS — I10 Essential (primary) hypertension: Secondary | ICD-10-CM

## 2012-10-04 DIAGNOSIS — I079 Rheumatic tricuspid valve disease, unspecified: Secondary | ICD-10-CM | POA: Insufficient documentation

## 2012-10-04 DIAGNOSIS — N179 Acute kidney failure, unspecified: Secondary | ICD-10-CM

## 2012-10-04 DIAGNOSIS — I160 Hypertensive urgency: Secondary | ICD-10-CM

## 2012-10-04 DIAGNOSIS — I509 Heart failure, unspecified: Secondary | ICD-10-CM | POA: Insufficient documentation

## 2012-10-04 DIAGNOSIS — M25519 Pain in unspecified shoulder: Secondary | ICD-10-CM | POA: Insufficient documentation

## 2012-10-04 DIAGNOSIS — A0472 Enterocolitis due to Clostridium difficile, not specified as recurrent: Secondary | ICD-10-CM

## 2012-10-04 DIAGNOSIS — E119 Type 2 diabetes mellitus without complications: Secondary | ICD-10-CM

## 2012-10-04 DIAGNOSIS — D638 Anemia in other chronic diseases classified elsewhere: Secondary | ICD-10-CM

## 2012-10-04 DIAGNOSIS — R0602 Shortness of breath: Secondary | ICD-10-CM | POA: Insufficient documentation

## 2012-10-04 DIAGNOSIS — M79609 Pain in unspecified limb: Principal | ICD-10-CM | POA: Insufficient documentation

## 2012-10-04 DIAGNOSIS — I208 Other forms of angina pectoris: Secondary | ICD-10-CM

## 2012-10-04 DIAGNOSIS — J9601 Acute respiratory failure with hypoxia: Secondary | ICD-10-CM

## 2012-10-04 DIAGNOSIS — F0390 Unspecified dementia without behavioral disturbance: Secondary | ICD-10-CM | POA: Insufficient documentation

## 2012-10-04 DIAGNOSIS — I639 Cerebral infarction, unspecified: Secondary | ICD-10-CM

## 2012-10-04 DIAGNOSIS — F068 Other specified mental disorders due to known physiological condition: Secondary | ICD-10-CM | POA: Diagnosis present

## 2012-10-04 DIAGNOSIS — Z8673 Personal history of transient ischemic attack (TIA), and cerebral infarction without residual deficits: Secondary | ICD-10-CM | POA: Insufficient documentation

## 2012-10-04 DIAGNOSIS — D649 Anemia, unspecified: Secondary | ICD-10-CM | POA: Insufficient documentation

## 2012-10-04 DIAGNOSIS — I2510 Atherosclerotic heart disease of native coronary artery without angina pectoris: Secondary | ICD-10-CM | POA: Insufficient documentation

## 2012-10-04 DIAGNOSIS — R079 Chest pain, unspecified: Secondary | ICD-10-CM | POA: Insufficient documentation

## 2012-10-04 DIAGNOSIS — I2089 Other forms of angina pectoris: Secondary | ICD-10-CM

## 2012-10-04 DIAGNOSIS — K219 Gastro-esophageal reflux disease without esophagitis: Secondary | ICD-10-CM | POA: Insufficient documentation

## 2012-10-04 DIAGNOSIS — M79602 Pain in left arm: Secondary | ICD-10-CM

## 2012-10-04 LAB — BASIC METABOLIC PANEL
BUN: 38 mg/dL — ABNORMAL HIGH (ref 6–23)
Calcium: 9.5 mg/dL (ref 8.4–10.5)
GFR calc non Af Amer: 35 mL/min — ABNORMAL LOW (ref >90)
Glucose, Bld: 159 mg/dL — ABNORMAL HIGH (ref 70–99)

## 2012-10-04 LAB — CBC
HCT: 31.4 % — ABNORMAL LOW (ref 36.0–46.0)
Hemoglobin: 10.2 g/dL — ABNORMAL LOW (ref 12.0–15.0)
MCH: 25.7 pg — ABNORMAL LOW (ref 26.0–34.0)
MCHC: 32.5 g/dL (ref 30.0–36.0)

## 2012-10-04 MED ORDER — METOPROLOL TARTRATE 25 MG PO TABS
25.0000 mg | ORAL_TABLET | Freq: Once | ORAL | Status: AC
  Administered 2012-10-04: 25 mg via ORAL
  Filled 2012-10-04: qty 1

## 2012-10-04 NOTE — ED Notes (Signed)
Pt updated on care and wait times.

## 2012-10-04 NOTE — ED Provider Notes (Signed)
History     CSN: [id number]  Arrival date & time 10/04/12  1725   First MD Initiated Contact with Patient 10/04/12 2148      Chief Complaint  Patient presents with  . Arm Pain     HPI 79 yo F with history of CHF (diastolic), DM, stroke presents complaining of left arm and left shoulder pain; she is noted to be significantly hypertensive. - About 3-4 hours prior to arrival, the patient was having an argument with her daughter and became very upset.  Shortly thereafter, she developed severe, aching, 10/10 pain in her left shoulder and left arm.  She also had some mild dyspnea.  This lasted for about 1 hour and resolved spontaneously.  She did not try any treatments for her pain.  She took her blood pressure during the episode and it was greater than 200 systolic.   - She did not have any chest pain, diaphoresis, nausea, or vomiting. - Denies headache, visual changes, difficulty with speech, gait, or balance, weakness, numbness, paresthesias, or other symptoms.  On arrival she is noted to be hypertensive to 193/84.   Past Medical History  Diagnosis Date  . Diabetes mellitus   . GERD (gastroesophageal reflux disease)   . Hypertension   . Diverticulitis   . Gout   . Essential and other specified forms of tremor   . Dementia   . CAD (coronary artery disease)   . CHF (congestive heart failure)     Diastolic dysfunction  . Stroke   . Seizures     r/t stroke    Past Surgical History  Procedure Laterality Date  . Dilation and curettage of uterus    . Tumor removal      Family History  Problem Relation Age of Onset  . Colon cancer Neg Hx   . Heart disease Father   . Heart disease Brother     History  Substance Use Topics  . Smoking status: Former Games developer  . Smokeless tobacco: Never Used  . Alcohol Use: No    OB History   Grav Para Term Preterm Abortions TAB SAB Ect Mult Living                  Review of Systems  Constitutional: Negative for fever, chills and  diaphoresis.  HENT: Negative for congestion, rhinorrhea, neck pain and neck stiffness.   Respiratory: Positive for shortness of breath. Negative for cough and wheezing.   Cardiovascular: Negative for chest pain and leg swelling.  Gastrointestinal: Negative for nausea, vomiting, abdominal pain and diarrhea.  Genitourinary: Negative for dysuria, urgency, frequency, flank pain, vaginal bleeding, vaginal discharge and difficulty urinating.  Skin: Negative for rash.  Neurological: Negative for weakness, numbness and headaches.  All other systems reviewed and are negative.    Allergies  Ace inhibitors; Codeine; Fentanyl; Morphine; and Penicillins  Home Medications   Current Outpatient Rx  Name  Route  Sig  Dispense  Refill  . benazepril (LOTENSIN) 5 MG tablet   Oral   Take 5 mg by mouth 2 (two) times daily.         . bimatoprost (LUMIGAN) 0.01 % SOLN   Ophthalmic   Apply 1 drop to eye 2 (two) times daily.         Marland Kitchen donepezil (ARICEPT) 10 MG tablet   Oral   Take 10 mg by mouth at bedtime.         Marland Kitchen esomeprazole (NEXIUM) 40 MG capsule   Oral  Take 40 mg by mouth 2 (two) times daily.         . furosemide (LASIX) 20 MG tablet   Oral   Take 20 mg by mouth daily.         Marland Kitchen glimepiride (AMARYL) 2 MG tablet   Oral   Take 2 mg by mouth daily before breakfast.         . Polyethyl Glycol-Propyl Glycol (SYSTANE ULTRA OP)   Both Eyes   Place 1 drop into both eyes daily as needed. For dry eyes         . pregabalin (LYRICA) 50 MG capsule   Oral   Take 50 mg by mouth 3 (three) times daily.         Marland Kitchen zolpidem (AMBIEN) 10 MG tablet   Oral   Take 10 mg by mouth at bedtime as needed for sleep.           BP 193/84  Pulse 77  Temp(Src) 97.8 F (36.6 C) (Oral)  Resp 14  Ht 5' 1.5" (1.562 m)  Wt 185 lb (83.915 kg)  BMI 34.39 kg/m2  SpO2 100%  Physical Exam  Nursing note and vitals reviewed. Constitutional: She is oriented to person, place, and time. She  appears well-developed and well-nourished. No distress.  HENT:  Head: Normocephalic and atraumatic.  Mouth/Throat: Oropharynx is clear and moist.  Eyes: Conjunctivae and EOM are normal. Pupils are equal, round, and reactive to light. No scleral icterus.  Neck: Normal range of motion. Neck supple. No JVD present.  Cardiovascular: Normal rate, regular rhythm, normal heart sounds and intact distal pulses.  Exam reveals no gallop and no friction rub.   No murmur heard. Pulmonary/Chest: Effort normal and breath sounds normal. No respiratory distress. She has no wheezes. She has no rales.  Abdominal: Soft. Bowel sounds are normal. She exhibits no distension. There is no tenderness. There is no rebound and no guarding.  Musculoskeletal: She exhibits no edema.  Neurological: She is alert and oriented to person, place, and time. No cranial nerve deficit. She exhibits normal muscle tone. Coordination normal.  Skin: Skin is warm and dry. She is not diaphoretic.    ED Course  Procedures (including critical care time)  Labs Reviewed  CBC - Abnormal; Notable for the following:    Hemoglobin 10.2 (*)    HCT 31.4 (*)    MCH 25.7 (*)    RDW 18.8 (*)    All other components within normal limits  BASIC METABOLIC PANEL - Abnormal; Notable for the following:    Sodium 133 (*)    Glucose, Bld 159 (*)    BUN 38 (*)    Creatinine, Ser 1.41 (*)    GFR calc non Af Amer 35 (*)    GFR calc Af Amer 41 (*)    All other components within normal limits  GLUCOSE, CAPILLARY - Abnormal; Notable for the following:    Glucose-Capillary 239 (*)    All other components within normal limits  POCT I-STAT TROPONIN I   No results found.   Date: 10/05/2012  Rate: 102  Rhythm: sinus tachycardia  QRS Axis: normal  Intervals: normal  ST/T Wave abnormalities: normal  Conduction Disutrbances:none  Narrative Interpretation:   Old EKG Reviewed: unchanged     1. Equivalent angina   2. Hypertensive urgency        MDM  79 yo F with DM, CHF, and HTN presents complaining of L arm and L shoulder pain, onset while  emotionally upset, and with hypertension to as high as 200s systolic. She is afebrile, hypertensive to 190s-200s, HR in 90s, vitals otherwise normal.  Well appearing, NAD, anxious.  Cardiopulmonary exam remarkable for normal heart sounds, normal pulses, bilateral LE non-pitting edema.  Non-ischemic EKG. Negative troponin X 1. Hb 10.2, BUN 38, creatinine 1.41  Possible angina equivalent in patient with significant risk factors.  Symptom free throughout ED course.  Hypertensive urgency.  Gave dose of metoprolol; labetalol; hydralazine administered by critical care MD.    Patient admitted to hospitalist service for ACS rule out, monitoring.    Toney Sang, MD 10/05/12 0136  I performed a history and physical examination of  Merilynn Finland and discussed her management with Dr. Peyton Najjar. I agree with the history, physical, assessment, and plan of care, with the following exceptions: None I was present for the following procedures: None  Time Spent in Critical Care of the patient: None  Time spent in discussions with the patient and family: 15 minutes  Pt with chest pain, dib, precipitated by argument. Elevated pressure in the ED. Has several Cardiac risk factors. Will control BP slightly better and admit for ACS rule out. Chest pain free during my evaluation with a neg trop and no acute findings on EKG.  Durk Carmen     Derwood Kaplan, MD 10/05/12 0157

## 2012-10-04 NOTE — ED Notes (Signed)
Pt c/o left arm pain into shoulder and htn after having argument with daughter today

## 2012-10-05 ENCOUNTER — Encounter (HOSPITAL_COMMUNITY): Payer: Self-pay | Admitting: Internal Medicine

## 2012-10-05 DIAGNOSIS — E119 Type 2 diabetes mellitus without complications: Secondary | ICD-10-CM

## 2012-10-05 DIAGNOSIS — I209 Angina pectoris, unspecified: Secondary | ICD-10-CM

## 2012-10-05 DIAGNOSIS — I10 Essential (primary) hypertension: Secondary | ICD-10-CM

## 2012-10-05 DIAGNOSIS — M79609 Pain in unspecified limb: Secondary | ICD-10-CM

## 2012-10-05 DIAGNOSIS — M79602 Pain in left arm: Secondary | ICD-10-CM | POA: Diagnosis present

## 2012-10-05 DIAGNOSIS — D638 Anemia in other chronic diseases classified elsewhere: Secondary | ICD-10-CM

## 2012-10-05 LAB — CBC WITH DIFFERENTIAL/PLATELET
Basophils Absolute: 0 K/uL (ref 0.0–0.1)
Basophils Relative: 1 % (ref 0–1)
Eosinophils Absolute: 0.1 K/uL (ref 0.0–0.7)
Eosinophils Relative: 2 % (ref 0–5)
HCT: 30.7 % — ABNORMAL LOW (ref 36.0–46.0)
Hemoglobin: 9.8 g/dL — ABNORMAL LOW (ref 12.0–15.0)
Lymphocytes Relative: 35 % (ref 12–46)
Lymphs Abs: 2.1 K/uL (ref 0.7–4.0)
MCH: 25.5 pg — ABNORMAL LOW (ref 26.0–34.0)
MCHC: 31.9 g/dL (ref 30.0–36.0)
MCV: 79.7 fL (ref 78.0–100.0)
Monocytes Absolute: 0.5 K/uL (ref 0.1–1.0)
Monocytes Relative: 8 % (ref 3–12)
Neutro Abs: 3.3 K/uL (ref 1.7–7.7)
Neutrophils Relative %: 55 % (ref 43–77)
Platelets: 291 K/uL (ref 150–400)
RBC: 3.85 MIL/uL — ABNORMAL LOW (ref 3.87–5.11)
RDW: 19.3 % — ABNORMAL HIGH (ref 11.5–15.5)
WBC: 6 K/uL (ref 4.0–10.5)

## 2012-10-05 LAB — COMPREHENSIVE METABOLIC PANEL WITH GFR
ALT: 12 U/L (ref 0–35)
AST: 21 U/L (ref 0–37)
Albumin: 3 g/dL — ABNORMAL LOW (ref 3.5–5.2)
Alkaline Phosphatase: 177 U/L — ABNORMAL HIGH (ref 39–117)
BUN: 38 mg/dL — ABNORMAL HIGH (ref 6–23)
CO2: 18 meq/L — ABNORMAL LOW (ref 19–32)
Calcium: 9.3 mg/dL (ref 8.4–10.5)
Chloride: 98 meq/L (ref 96–112)
Creatinine, Ser: 1.31 mg/dL — ABNORMAL HIGH (ref 0.50–1.10)
GFR calc Af Amer: 45 mL/min — ABNORMAL LOW (ref >90)
GFR calc non Af Amer: 38 mL/min — ABNORMAL LOW (ref >90)
Glucose, Bld: 215 mg/dL — ABNORMAL HIGH (ref 70–99)
Potassium: 4 meq/L (ref 3.5–5.1)
Sodium: 132 meq/L — ABNORMAL LOW (ref 135–145)
Total Bilirubin: 0.2 mg/dL — ABNORMAL LOW (ref 0.3–1.2)
Total Protein: 7.3 g/dL (ref 6.0–8.3)

## 2012-10-05 LAB — VITAMIN B12: Vitamin B-12: 390 pg/mL (ref 211–911)

## 2012-10-05 LAB — TSH: TSH: 4.329 u[IU]/mL (ref 0.350–4.500)

## 2012-10-05 LAB — GLUCOSE, CAPILLARY
Glucose-Capillary: 195 mg/dL — ABNORMAL HIGH (ref 70–99)
Glucose-Capillary: 152 mg/dL — ABNORMAL HIGH (ref 70–99)
Glucose-Capillary: 158 mg/dL — ABNORMAL HIGH (ref 70–99)

## 2012-10-05 LAB — TROPONIN I: Troponin I: <0.3 ng/mL (ref <0.30)

## 2012-10-05 MED ORDER — ONDANSETRON HCL 4 MG/2ML IJ SOLN
4.0000 mg | Freq: Three times a day (TID) | INTRAMUSCULAR | Status: AC | PRN
  Administered 2012-10-05: 4 mg via INTRAVENOUS
  Filled 2012-10-05: qty 2

## 2012-10-05 MED ORDER — ASPIRIN EC 325 MG PO TBEC
325.0000 mg | DELAYED_RELEASE_TABLET | Freq: Every day | ORAL | Status: DC
  Administered 2012-10-05 – 2012-10-06 (×2): 325 mg via ORAL
  Filled 2012-10-05: qty 1

## 2012-10-05 MED ORDER — FUROSEMIDE 20 MG PO TABS
20.0000 mg | ORAL_TABLET | Freq: Every day | ORAL | Status: DC
  Administered 2012-10-05 – 2012-10-06 (×2): 20 mg via ORAL
  Filled 2012-10-05 (×2): qty 1

## 2012-10-05 MED ORDER — ZOLPIDEM TARTRATE 5 MG PO TABS: 10.0000 mg | ORAL_TABLET | Freq: Every evening | ORAL | Status: DC | PRN

## 2012-10-05 MED ORDER — ISOSORB DINITRATE-HYDRALAZINE 20-37.5 MG PO TABS
1.0000 | ORAL_TABLET | Freq: Two times a day (BID) | ORAL | Status: DC
  Administered 2012-10-05 – 2012-10-06 (×3): 1 via ORAL
  Filled 2012-10-05 (×4): qty 1

## 2012-10-05 MED ORDER — BIMATOPROST 0.01 % OP SOLN
1.0000 [drp] | Freq: Two times a day (BID) | OPHTHALMIC | Status: DC
  Administered 2012-10-05 – 2012-10-06 (×2): 1 [drp] via OPHTHALMIC
  Filled 2012-10-05: qty 2.5

## 2012-10-05 MED ORDER — NITROGLYCERIN 0.4 MG SL SUBL: 0.4000 mg | SUBLINGUAL_TABLET | SUBLINGUAL | Status: DC | PRN

## 2012-10-05 MED ORDER — ZOLPIDEM TARTRATE 5 MG PO TABS: 5.0000 mg | ORAL_TABLET | Freq: Every evening | ORAL | Status: DC | PRN

## 2012-10-05 MED ORDER — ONDANSETRON HCL 4 MG/2ML IJ SOLN: 4.0000 mg | Freq: Four times a day (QID) | INTRAMUSCULAR | Status: DC | PRN

## 2012-10-05 MED ORDER — PANTOPRAZOLE SODIUM 40 MG PO TBEC
40.0000 mg | DELAYED_RELEASE_TABLET | Freq: Every day | ORAL | Status: DC
  Administered 2012-10-05: 40 mg via ORAL
  Filled 2012-10-05 (×2): qty 1

## 2012-10-05 MED ORDER — SODIUM CHLORIDE 0.9 % IJ SOLN
3.0000 mL | Freq: Two times a day (BID) | INTRAMUSCULAR | Status: DC
  Administered 2012-10-05 (×2): 3 mL via INTRAVENOUS

## 2012-10-05 MED ORDER — LABETALOL HCL 5 MG/ML IV SOLN
20.0000 mg | Freq: Once | INTRAVENOUS | Status: AC
  Administered 2012-10-05: 20 mg via INTRAVENOUS
  Filled 2012-10-05: qty 4

## 2012-10-05 MED ORDER — ENOXAPARIN SODIUM 40 MG/0.4ML ~~LOC~~ SOLN
40.0000 mg | Freq: Every day | SUBCUTANEOUS | Status: DC
  Administered 2012-10-05 – 2012-10-06 (×2): 40 mg via SUBCUTANEOUS
  Filled 2012-10-05 (×2): qty 0.4

## 2012-10-05 MED ORDER — GLIMEPIRIDE 2 MG PO TABS
2.0000 mg | ORAL_TABLET | Freq: Every day | ORAL | Status: DC
  Administered 2012-10-05 – 2012-10-06 (×2): 2 mg via ORAL
  Filled 2012-10-05 (×3): qty 1

## 2012-10-05 MED ORDER — INSULIN ASPART 100 UNIT/ML ~~LOC~~ SOLN
0.0000 [IU] | Freq: Three times a day (TID) | SUBCUTANEOUS | Status: DC
  Administered 2012-10-05 – 2012-10-06 (×3): 2 [IU] via SUBCUTANEOUS

## 2012-10-05 MED ORDER — HYDRALAZINE HCL 25 MG PO TABS
25.0000 mg | ORAL_TABLET | Freq: Three times a day (TID) | ORAL | Status: DC
  Filled 2012-10-05 (×4): qty 1

## 2012-10-05 MED ORDER — ONDANSETRON HCL 4 MG PO TABS: 4.0000 mg | ORAL_TABLET | Freq: Four times a day (QID) | ORAL | Status: DC | PRN

## 2012-10-05 MED ORDER — HYDRALAZINE HCL 20 MG/ML IJ SOLN: 10.0000 mg | INTRAMUSCULAR | Status: DC | PRN

## 2012-10-05 MED ORDER — PREGABALIN 50 MG PO CAPS
50.0000 mg | ORAL_CAPSULE | Freq: Three times a day (TID) | ORAL | Status: DC
  Administered 2012-10-05 – 2012-10-06 (×4): 50 mg via ORAL
  Filled 2012-10-05 (×4): qty 1

## 2012-10-05 MED ORDER — HYDRALAZINE HCL 20 MG/ML IJ SOLN
INTRAMUSCULAR | Status: AC
  Administered 2012-10-05: 20 mg via INTRAVENOUS
  Filled 2012-10-05: qty 1

## 2012-10-05 MED ORDER — ACETAMINOPHEN 325 MG PO TABS
650.0000 mg | ORAL_TABLET | Freq: Four times a day (QID) | ORAL | Status: DC | PRN
  Administered 2012-10-05: 650 mg via ORAL
  Filled 2012-10-05: qty 2

## 2012-10-05 MED ORDER — LABETALOL HCL 5 MG/ML IV SOLN
0.5000 mg/min | INTRAVENOUS | Status: DC
  Filled 2012-10-05: qty 100

## 2012-10-05 MED ORDER — ACETAMINOPHEN 650 MG RE SUPP: 650.0000 mg | Freq: Four times a day (QID) | RECTAL | Status: DC | PRN

## 2012-10-05 MED ORDER — DONEPEZIL HCL 10 MG PO TABS
10.0000 mg | ORAL_TABLET | Freq: Every day | ORAL | Status: DC
  Administered 2012-10-05: 10 mg via ORAL
  Filled 2012-10-05 (×2): qty 1

## 2012-10-05 MED ORDER — HYDRALAZINE HCL 20 MG/ML IJ SOLN: 20.0000 mg | Freq: Once | INTRAMUSCULAR | Status: AC

## 2012-10-05 MED ORDER — HYDROMORPHONE HCL PF 1 MG/ML IJ SOLN: 1.0000 mg | Freq: Four times a day (QID) | INTRAMUSCULAR | Status: DC | PRN

## 2012-10-05 MED ORDER — HYDROMORPHONE HCL PF 1 MG/ML IJ SOLN
1.0000 mg | INTRAMUSCULAR | Status: DC | PRN
  Administered 2012-10-05: 1 mg via INTRAVENOUS
  Filled 2012-10-05: qty 1

## 2012-10-05 MED ORDER — SODIUM CHLORIDE 0.9 % IJ SOLN: 3.0000 mL | Freq: Two times a day (BID) | INTRAMUSCULAR | Status: DC

## 2012-10-05 NOTE — Progress Notes (Signed)
Patient seen and examined. Admitted after midnight secondary to hypertensive urgency and left arm with associated SOB. Concerns for angina equivalent given risk factors for ACS. Referred to H&P by Dr. Toniann Fail for further details/inof on admission. Currently patient is pain free, but reports having some tingling sensation her left arm and hand; no weakness. Also denies CP, nausea, vomiting, abdominal pain or any other complaints. Initial set of cardiac enzymes negative.  Plan: -cycle cardiac enzymes -will check TSH and B12 -control BP using bidil -couldn't find records from PCP or reach him him at his office number to investigate regarding antiepileptics drugs. Family not present and per pharmacy records she is currently not on it.   Freeland Pracht [phone]

## 2012-10-05 NOTE — H&P (Signed)
[facility] History and Physical  Glenda Gonzalez ZOX:[id number] DOB: Feb 24, 1934 DOA: 10/04/2012  Referring physician: ER physician. PCP: Billee Cashing, MD  Chief Complaint: Left arm pain.  HPI: Glenda Gonzalez is a 79 y.o. female with history of diabetes mellitus type 2, seizure, CVA, hypertension and chronic anemia was brought to the ER after patient started experiencing pressure-like pain of the left arm after patient had argument with her daughter. Patient also was found to be having elevated blood pressure on arrival in the ER. Initially labetalol IV was given as a bolus which did not improve the blood pressure following which hydralazine IV was given after which her blood pressure improved. Patient's left arm pain has completely resolved at this time. Patient during the episode of left arm pain also had mild shortness of breath. Given her risk factors at this time patient has been admitted for ruling out ACS. Patient presently is asymptomatic.  Review of Systems: As presented in the history of presenting illness, rest negative.  Past Medical History  Diagnosis Date  . Diabetes mellitus   . GERD (gastroesophageal reflux disease)   . Hypertension   . Diverticulitis   . Gout   . Essential and other specified forms of tremor   . Dementia   . CAD (coronary artery disease)   . CHF (congestive heart failure)     Diastolic dysfunction  . Stroke   . Seizures     r/t stroke   Past Surgical History  Procedure Laterality Date  . Dilation and curettage of uterus    . Tumor removal     Social History:  reports that she has quit smoking. She has never used smokeless tobacco. She reports that she does not drink alcohol or use illicit drugs. Lives at home. where does patient live-- Can do ADLs. Can patient participate in ADLs?  Allergies  Allergen Reactions  . Ace Inhibitors Shortness Of Breath and Other (See Comments)    Angioedema   . Codeine Other (See Comments)    hallucination  . Fentanyl Other (See Comments)    Abnormal behavior per husband  . Morphine Itching  . Penicillins Itching    Family History  Problem Relation Age of Onset  . Colon cancer Neg Hx   . Heart disease Father   . Heart disease Brother       Prior to Admission medications   Medication Sig Start Date End Date Taking? Authorizing Provider  benazepril (LOTENSIN) 5 MG tablet Take 5 mg by mouth 2 (two) times daily.   Yes Historical Provider, MD  bimatoprost (LUMIGAN) 0.01 % SOLN Apply 1 drop to eye 2 (two) times daily.   Yes Historical Provider, MD  donepezil (ARICEPT) 10 MG tablet Take 10 mg by mouth at bedtime.   Yes Historical Provider, MD  esomeprazole (NEXIUM) 40 MG capsule Take 40 mg by mouth 2 (two) times daily.   Yes Historical Provider, MD  furosemide (LASIX) 20 MG tablet Take 20 mg by mouth daily.   Yes Historical Provider, MD  glimepiride (AMARYL) 2 MG tablet Take 2 mg by mouth daily before breakfast.   Yes Historical Provider, MD  Polyethyl Glycol-Propyl Glycol (SYSTANE ULTRA OP) Place 1 drop into both eyes daily as needed. For dry eyes   Yes Historical Provider, MD  pregabalin (LYRICA) 50 MG capsule Take 50 mg by mouth 3 (three) times daily.   Yes Historical Provider, MD  zolpidem (AMBIEN) 10 MG tablet Take 10 mg by mouth at bedtime as  needed for sleep.   Yes Historical Provider, MD   Physical Exam: Filed Vitals:   10/05/12 0100 10/05/12 0103 10/05/12 0115 10/05/12 0130  BP: 203/113 193/84 131/63 126/69  Pulse: 75 77 77 73  Temp:      TempSrc:      Resp: 19 14 14 16   Height:      Weight:      SpO2: 100% 100% 94% 98%     General:  Well-developed well-nourished.  Eyes: Anicteric no pallor.  ENT: No discharge from the ears eyes nose and mouth.  Neck: No mass felt.  Cardiovascular: S1-S2 heard.  Respiratory: No rhonchi or crepitations.  Abdomen: Soft nontender bowel sounds present.  Skin: No rash.  Musculoskeletal: No edema.  Psychiatric:  Appears normal.  Neurologic: Alert awake oriented to time place and person. Moves all extremities.  Labs on Admission:  Basic Metabolic Panel:  Recent Labs Lab 10/04/12 1925  NA 133*  K 4.1  CL 99  CO2 20  GLUCOSE 159*  BUN 38*  CREATININE 1.41*  CALCIUM 9.5   Liver Function Tests: No results found for this basename: AST, ALT, ALKPHOS, BILITOT, PROT, ALBUMIN,  in the last 168 hours No results found for this basename: LIPASE, AMYLASE,  in the last 168 hours No results found for this basename: AMMONIA,  in the last 168 hours CBC:  Recent Labs Lab 10/04/12 1925  WBC 7.0  HGB 10.2*  HCT 31.4*  MCV 79.1  PLT 274   Cardiac Enzymes: No results found for this basename: CKTOTAL, CKMB, CKMBINDEX, TROPONINI,  in the last 168 hours  BNP (last 3 results)  Recent Labs  06/08/12 2309 07/22/12 0410 09/19/12 2052  PROBNP 234.3 979.7* 257.8   CBG:  Recent Labs Lab 10/04/12 2149  GLUCAP 239*    Radiological Exams on Admission: No results found.  EKG: Independently reviewed. Normal sinus rhythm.  Assessment/Plan Principal Problem:   Left arm pain Active Problems:   DM   DEMENTIA, MILD   HYPERTENSION   CVA (cerebral infarction)   1. Left arm pain to rule out ACS - presently asymptomatic. Cycle cardiac markers. Aspirin. When necessary nitroglycerin. 2. Uncontrolled hypertension - patient's home medication has to be rechecked. Patient did have a history of angioedema secondary to ACE inhibitors this year and was intubated. Patient's medication list still states benazepril. At this time I have placed patient on oral hydralazine 25 mg 3 times a day along with when necessary IV hydralazine for systolic blood pressure more than 160. I have not continued benazepril. 3. Diabetes mellitus type 2 - continue home medications. 4. Recent admission for angioedema secondary to ACE inhibitors. 5. History of seizures - presently don't see patient is on any antiseizure medications.  Please confirm with family in a.m.    Code Status: Full code.  Family Communication: Tried to reach family but unable to.  Disposition Plan: Admit for observation.    Glenda Gonzalez N. [facility] Pager [phone].  If 7PM-7AM, please contact night-coverage [email or web address] Password [facility] 10/05/2012, 1:57 AM

## 2012-10-05 NOTE — Progress Notes (Signed)
Inpatient Diabetes Program Recommendations  AACE/ADA: New Consensus Statement on Inpatient Glycemic Control (2013)  Target Ranges:  Prepandial:   less than 140 mg/dL      Peak postprandial:   less than 180 mg/dL (1-2 hours)      Critically ill patients:  140 - 180 mg/dL   Results for AARIA, HAPP (MRN [id number]) as of 10/05/2012 13:37  Ref. Range 10/04/2012 21:49 10/05/2012 07:40  Glucose-Capillary Latest Range: 70-99 mg/dL 295 (H) 188 (H)   Inpatient Diabetes Program Recommendations HgbA1C: Please consdier ordering an A1C.  Last A1C was 10.8% on 07/10/2012.  Note: Poor diabetes control noted in February 2014 with A1C results of 10.8%.  Please consider ordering an A1C to determine glycemic control over the past 2-3 months with current outpatient diabetes medication management (Amaryl 2mg  QAM).  Noted that today's noon insulin charted as "not given; patient refused".  Called Chasidy, RN to discuss and was informed that patient does not want to be stuck and she is combative at times.  Will continue to follow.    Thanks, Orlando Penner, RN, BSN, CCRN Diabetes Coordinator Inpatient Diabetes Program [phone]

## 2012-10-06 DIAGNOSIS — N179 Acute kidney failure, unspecified: Secondary | ICD-10-CM

## 2012-10-06 DIAGNOSIS — I6350 Cerebral infarction due to unspecified occlusion or stenosis of unspecified cerebral artery: Secondary | ICD-10-CM

## 2012-10-06 DIAGNOSIS — R072 Precordial pain: Secondary | ICD-10-CM

## 2012-10-06 LAB — GLUCOSE, CAPILLARY: Glucose-Capillary: 186 mg/dL — ABNORMAL HIGH (ref 70–99)

## 2012-10-06 LAB — CBC
HCT: 25.5 % — ABNORMAL LOW (ref 36.0–46.0)
MCH: 25.5 pg — ABNORMAL LOW (ref 26.0–34.0)
MCV: 79.4 fL (ref 78.0–100.0)
Platelets: 282 10*3/uL (ref 150–400)
RBC: 3.21 MIL/uL — ABNORMAL LOW (ref 3.87–5.11)

## 2012-10-06 LAB — BASIC METABOLIC PANEL
BUN: 46 mg/dL — ABNORMAL HIGH (ref 6–23)
CO2: 20 mEq/L (ref 19–32)
Calcium: 8.4 mg/dL (ref 8.4–10.5)
Creatinine, Ser: 1.51 mg/dL — ABNORMAL HIGH (ref 0.50–1.10)
Glucose, Bld: 196 mg/dL — ABNORMAL HIGH (ref 70–99)

## 2012-10-06 MED ORDER — ASPIRIN 81 MG PO TBEC: 81.0000 mg | DELAYED_RELEASE_TABLET | Freq: Every day | ORAL | Status: DC

## 2012-10-06 MED ORDER — NITROGLYCERIN 0.4 MG SL SUBL: 0.4000 mg | SUBLINGUAL_TABLET | SUBLINGUAL | Status: DC | PRN

## 2012-10-06 MED ORDER — ISOSORB DINITRATE-HYDRALAZINE 20-37.5 MG PO TABS: 1.0000 | ORAL_TABLET | Freq: Two times a day (BID) | ORAL | Status: DC

## 2012-10-06 NOTE — Progress Notes (Signed)
*  PRELIMINARY RESULTS* Echocardiogram 2D Echocardiogram has been performed.  Jeryl Columbia 10/06/2012, 12:07 PM

## 2012-10-06 NOTE — Discharge Summary (Signed)
Physician Discharge Summary  Patient ID: Glenda Gonzalez MRN: [id number] DOB/AGE: 10/29/1933 79 y.o.  Admit date: 10/04/2012 Discharge date: 10/06/2012  Primary Care Physician:  Billee Cashing, MD  Discharge Diagnoses:    . Left arm pain/atypical CP- resolved likely secondary to hypertensive urgency  . HYPERTENSION . DM . DEMENTIA, MILD . CVA (cerebral infarction)  Consults: None   Recommendations for Outpatient Follow-up:  1) Patient was recommended to keep a log of her blood pressure readings, started on new medications, need to be titrated outpatient 2) Benazepril currently on hold due to renal insufficiency and allergy noted on the record  Allergies:   Allergies  Allergen Reactions  . Ace Inhibitors Shortness Of Breath and Other (See Comments)    Angioedema   . Codeine Other (See Comments)    hallucination  . Fentanyl Other (See Comments)    Abnormal behavior per husband  . Morphine Itching  . Penicillins Itching     Discharge Medications:   Medication List    STOP taking these medications       benazepril 5 MG tablet  Commonly known as:  LOTENSIN      TAKE these medications       aspirin 81 MG EC tablet  Commonly known as:  aspirin EC  Take 1 tablet (81 mg total) by mouth daily. Swallow whole.     bimatoprost 0.01 % Soln  Commonly known as:  LUMIGAN  Apply 1 drop to eye 2 (two) times daily.     donepezil 10 MG tablet  Commonly known as:  ARICEPT  Take 10 mg by mouth at bedtime.     esomeprazole 40 MG capsule  Commonly known as:  NEXIUM  Take 40 mg by mouth 2 (two) times daily.     furosemide 20 MG tablet  Commonly known as:  LASIX  Take 20 mg by mouth daily.     glimepiride 2 MG tablet  Commonly known as:  AMARYL  Take 2 mg by mouth daily before breakfast.     isosorbide-hydrALAZINE 20-37.5 MG per tablet  Commonly known as:  BIDIL  Take 1 tablet by mouth 2 (two) times daily.     nitroGLYCERIN 0.4 MG SL tablet  Commonly known  as:  NITROSTAT  Place 1 tablet (0.4 mg total) under the tongue every 5 (five) minutes as needed for chest pain.     pregabalin 50 MG capsule  Commonly known as:  LYRICA  Take 50 mg by mouth 3 (three) times daily.     SYSTANE ULTRA OP  Place 1 drop into both eyes daily as needed. For dry eyes     zolpidem 10 MG tablet  Commonly known as:  AMBIEN  Take 10 mg by mouth at bedtime as needed for sleep.         Brief H and P: For complete details please refer to admission H and P, but in brief Glenda Gonzalez is a 79 y.o. female with history of diabetes mellitus type 2, seizure, CVA, hypertension and chronic anemia was brought to the ER after patient started experiencing pressure-like pain of the left arm after patient had argument with her daughter. Patient also was found to be having elevated blood pressure on arrival in the ER. Initially labetalol IV was given as a bolus which did not improve the blood pressure following which hydralazine IV was given after which her blood pressure improved. Patient's left arm pain had completely resolved in ED. Patient during the episode of  left arm pain also had mild shortness of breath. Given her risk factors at this time patient was admitted to rule out ACS   Hospital Course:  Left arm pain/atypical chest pain: Resolved, likely secondary to hypertensive urgency after her argument with her daughter. Serial cardiac enzymes negative so far. Continue aspirin, bidil, PRN nitro her chest pain. 2-D echocardiogram was done which showed EF of 55-60%, normal wall motion, no regional wall motion abnormalities, no valvular abnormalities.  DM : continue Amaryl at home. She was placed on sliding scale insulin inpatient.  DEMENTIA, MILD- continue Aricept   HYPERTENSION- started on BiDil, hold Lotensin due to renal insufficiency. Need tighter BP control outpatient and further titration of antihypertensives. Patient was advised to keep a log of her blood pressure  readings and take it to her primary care physician at the appointment.  History of CVA (cerebral infarction): Placed on aspirin, BP control  Patient was seen by physical therapy prior to DC, patient is currently at her baseline status and does not need any physical therapy outpatient.   Day of Discharge BP 137/81  Pulse 71  Temp(Src) 98.1 F (36.7 C) (Oral)  Resp 18  Ht 5\' 1"  (1.549 m)  Wt 78.064 kg (172 lb 1.6 oz)  BMI 32.53 kg/m2  SpO2 94%  Physical Exam: General: Alert and awake oriented x3 not in any acute distress. HEENT: anicteric sclera, pupils reactive to light and accommodation CVS: S1-S2 clear no murmur rubs or gallops Chest: clear to auscultation bilaterally, no wheezing rales or rhonchi Abdomen: soft nontender, nondistended, normal bowel sounds, no organomegaly Extremities: no cyanosis, clubbing or edema noted bilaterally Neuro: Cranial nerves II-XII intact, no focal neurological deficits   The results of significant diagnostics from this hospitalization (including imaging, microbiology, ancillary and laboratory) are listed below for reference.    LAB RESULTS: Basic Metabolic Panel:  Recent Labs Lab 10/05/12 0911 10/06/12 1133  NA 132* 134*  K 4.0 4.2  CL 98 100  CO2 18* 20  GLUCOSE 215* 196*  BUN 38* 46*  CREATININE 1.31* 1.51*  CALCIUM 9.3 8.4   Liver Function Tests:  Recent Labs Lab 10/05/12 0911  AST 21  ALT 12  ALKPHOS 177*  BILITOT 0.2*  PROT 7.3  ALBUMIN 3.0*   No results found for this basename: LIPASE, AMYLASE,  in the last 168 hours No results found for this basename: AMMONIA,  in the last 168 hours CBC:  Recent Labs Lab 10/05/12 0911 10/06/12 1133  WBC 6.0 5.8  NEUTROABS 3.3  --   HGB 9.8* 8.2*  HCT 30.7* 25.5*  MCV 79.7 79.4  PLT 291 282   Cardiac Enzymes:  Recent Labs Lab 10/05/12 0913 10/05/12 1430  TROPONINI <0.30 <0.30   BNP: No components found with this basename: POCBNP,  CBG:  Recent Labs Lab  10/06/12 0721 10/06/12 1124  GLUCAP 75 186*    Significant Diagnostic Studies:  No results found.  2D ECHO: Study Conclusions  - Left ventricle: The cavity size was normal. Systolic function was normal. The estimated ejection fraction was in the range of 55% to 60%. Wall motion was normal; there were no regional wall motion abnormalities. - Pulmonary arteries: PA peak pressure: 37mm Hg (S).    Disposition and Follow-up:     Discharge Orders   Future Orders Complete By Expires     Diet Carb Modified  As directed     Discharge instructions  As directed     Comments:  Your BP meds may need to be adjusted. Please keep a log of BP everyday and take it Dr Thea Silversmith on your appointment.    Increase activity slowly  As directed         DISPOSITION: Home   DIET: Carb modified ACTIVITY: As tolerated   DISCHARGE FOLLOW-UP Follow-up Information   Follow up with Billee Cashing, MD. Schedule an appointment as soon as possible for a visit in 2 weeks. (for hospital follow-up.  )    Contact information:   [place] [address] [phone]       Time spent on Discharge: 59 MINS  Signed:   Blane Worthington M.D. [facility] 10/06/2012, 1:22 PM Pager: [phone]

## 2012-10-06 NOTE — Progress Notes (Signed)
Physical Therapy Evaluation Patient Details Name: Glenda Gonzalez MRN: [id number] DOB: July 04, 1933 Today's Date: 10/06/2012 Time: 4098-1191 PT Time Calculation (min): 16 min  PT Assessment / Plan / Recommendation Clinical Impression  Glenda Gonzalez is a 79 y.o. female with history of diabetes mellitus type 2, seizure, CVA, hypertension and chronic anemia was brought to the ER after patient started experiencing pressure-like pain of the left arm after patient had argument with her daughter.  Pt appears to be performing functional mobility task at her baseline level. Pt lives with her son and spouse who assist her with transfers and ADLs.  Pt presents with no further acute or follow-up PT needs.     PT Assessment  Patent does not need any further PT services    Follow Up Recommendations  No PT follow up    Does the patient have the potential to tolerate intense rehabilitation      Barriers to Discharge        Equipment Recommendations  None recommended by PT    Recommendations for Other Services     Frequency      Precautions / Restrictions Precautions Precautions: Fall Restrictions Weight Bearing Restrictions: No   Pertinent Vitals/Pain No c/o pain. SpO2 >92% on room air throughout session.       Mobility  Bed Mobility Bed Mobility: Supine to Sit;Sit to Supine Supine to Sit: 4: Min assist;HOB flat Sit to Supine: 5: Supervision Details for Bed Mobility Assistance: Assist to raise shoulders from bed  Transfers Transfers: Sit to Stand;Stand to Sit Sit to Stand: 4: Min guard;From bed;From elevated surface;With upper extremity assist Stand to Sit: 4: Min guard;To bed;With upper extremity assist Details for Transfer Assistance: VCs for hand placement.   Ambulation/Gait Ambulation/Gait Assistance: 5: Supervision Ambulation Distance (Feet): 30 Feet Assistive device: Rolling walker Ambulation/Gait Assistance Details: No assistance or instruction required.  Gait  Pattern: Within Functional Limits Stairs: No Wheelchair Mobility Wheelchair Mobility: No    Exercises     PT Diagnosis:    PT Problem List:   PT Treatment Interventions:     PT Goals    Visit Information  Last PT Received On: 10/06/12    Subjective Data  Subjective: agree to PT eval Patient Stated Goal: II will do anything to go home today.     Prior Functioning  Home Living Lives With: Spouse;Son Available Help at Discharge: Available 24 hours/day;Family Type of Home: House Home Access: Stairs to enter;Ramped entrance Entrance Stairs-Number of Steps: 3 Entrance Stairs-Rails: None Home Layout: One level Home Adaptive Equipment: Walker - rolling;Wheelchair - manual Prior Function Level of Independence: Needs assistance Needs Assistance: Transfers;Gait Gait Assistance: Supervision Transfer Assistance: Min assist for transition from supine to sit.  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[employer] Dominant Hand: Right    Cognition  Cognition Arousal/Alertness: Awake/alert Behavior During Therapy: WFL for tasks assessed/performed Overall Cognitive Status: Within Functional Limits for tasks assessed    Extremity/Trunk Assessment Right Upper Extremity Assessment RUE ROM/Strength/Tone: Within functional levels Left Upper Extremity Assessment LUE ROM/Strength/Tone: Within functional levels Right Lower Extremity Assessment RLE ROM/Strength/Tone: Within functional levels RLE Sensation: WFL - Light Touch;WFL - Proprioception RLE Coordination: WFL - gross motor Left Lower Extremity Assessment LLE ROM/Strength/Tone: Within functional levels LLE Sensation: WFL - Light Touch;WFL - Proprioception LLE Coordination: WFL - gross motor   Balance Balance Balance Assessed: Yes Static Sitting Balance Static Sitting - Balance Support: Feet supported Static Sitting - Level of Assistance: 5: Stand  by assistance Static Sitting - Comment/#  of Minutes: Pt c/o room spinning initially. Resolve after approximately 30 seconds.   Static Standing Balance Static Standing - Balance Support: Bilateral upper extremity supported Static Standing - Level of Assistance: 5: Stand by assistance Static Standing - Comment/# of Minutes: 45 seconds standing with no LOB or dizzines.    End of Session PT - End of Session Equipment Utilized During Treatment: Gait belt Activity Tolerance: Patient tolerated treatment well Patient left: in bed;with call bell/phone within reach;with nursing in room (Pt having echocardiagram) Nurse Communication: Mobility status  GP Functional Assessment Tool Used: clinical Judgement.  Functional Limitation: Mobility: Walking and moving around Mobility: Walking and Moving Around Current Status [phone]): At least 1 percent but less than 20 percent impaired, limited or restricted Mobility: Walking and Moving Around Goal Status [phone]): At least 1 percent but less than 20 percent impaired, limited or restricted Mobility: Walking and Moving Around Discharge Status [phone]): At least 1 percent but less than 20 percent impaired, limited or restricted   Jamicheal Heard 10/06/2012, 11:22 AM Wray Goehring L. Chilton Si DPT  pager [phone]    Cell [phone]

## 2012-10-06 NOTE — Progress Notes (Signed)
Pt and pt husband provided with dc instructions and education. Prescriptions handed to husband. No questions at this time. IV removed with tip intact. Heart monitor cleaned and returned to front. Levonne Spiller, RN

## 2012-10-06 NOTE — Progress Notes (Signed)
Patient ID: Glenda Gonzalez  female  ZOX:[id number]    DOB: [birth year]    DOA: 10/04/2012  PCP: Billee Cashing, MD  Assessment/Plan: Principal Problem:   Left arm pain/atypical chest pain: Resolved, likely secondary to hypertensive urgency after her argument with her daughter - Serial cardiac enzymes negative so far - Continue aspirin, bidil, PRN nitro - 2D echo done, results pending    Active Problems:   DM : continue Amaryl at home, sliding scale insulin inpatient-    DEMENTIA, MILD- continue Aricept   HYPERTENSION- started on BiDil, hold Lotensin due to     History of CVA (cerebral infarction): Placed on aspirin, BP control  DVT Prophylaxis:  Code Status:  Disposition:Hopefully DC home if ECHO is normal    Subjective: No chest pain or left arm pain currently, eating breakfast at the time of my examination this morning   Objective: Weight change:   Intake/Output Summary (Last 24 hours) at 10/06/12 1158 Last data filed at 10/06/12 0915  Gross per 24 hour  Intake   1083 ml  Output      0 ml  Net   1083 ml   Blood pressure 137/81, pulse 71, temperature 98.1 F (36.7 C), temperature source Oral, resp. rate 18, height 5\' 1"  (1.549 m), weight 78.064 kg (172 lb 1.6 oz), SpO2 94.00%.  Physical Exam: General: Alert and awake, oriented x3, not in any acute distress. CVS: S1-S2 clear, no murmur rubs or gallops Chest: clear to auscultation bilaterally, no wheezing, rales or rhonchi Abdomen: soft nontender, nondistended, normal bowel sounds Extremities: no cyanosis, clubbing or edema noted bilaterally   Lab Results: Basic Metabolic Panel:  Recent Labs Lab 10/04/12 1925 10/05/12 0911  NA 133* 132*  K 4.1 4.0  CL 99 98  CO2 20 18*  GLUCOSE 159* 215*  BUN 38* 38*  CREATININE 1.41* 1.31*  CALCIUM 9.5 9.3   Liver Function Tests:  Recent Labs Lab 10/05/12 0911  AST 21  ALT 12  ALKPHOS 177*  BILITOT 0.2*  PROT 7.3  ALBUMIN 3.0*   No results found for  this basename: LIPASE, AMYLASE,  in the last 168 hours No results found for this basename: AMMONIA,  in the last 168 hours CBC:  Recent Labs Lab 10/04/12 1925 10/05/12 0911  WBC 7.0 6.0  NEUTROABS  --  3.3  HGB 10.2* 9.8*  HCT 31.4* 30.7*  MCV 79.1 79.7  PLT 274 291   Cardiac Enzymes:  Recent Labs Lab 10/05/12 0913 10/05/12 1430  TROPONINI <0.30 <0.30   BNP: No components found with this basename: POCBNP,  CBG:  Recent Labs Lab 10/05/12 0740 10/05/12 1707 10/05/12 2131 10/06/12 0721 10/06/12 1124  GLUCAP 195* 152* 158* 75 186*     Micro Results: No results found for this or any previous visit (from the past 240 hour(s)).  Studies/Results: Dg Chest Port 1 View  09/19/2012   *RADIOLOGY REPORT*  Clinical Data: Short of breath.  Fall 1 day ago.  PORTABLE CHEST - 1 VIEW  Comparison: 07/22/2012.  Findings: Heart size upper limits of normal for projection. Monitoring leads are projected over the chest. No pneumothorax. Bilateral AC joint osteoarthritis.  Basilar atelectasis.  No pleural effusion.  No airspace disease.  No displaced rib fractures identified.  IMPRESSION: No acute cardiopulmonary disease.   Original Report Authenticated By: Andreas Newport, M.D.    Medications: Scheduled Meds: . aspirin EC  325 mg Oral Daily  . bimatoprost  1 drop Both Eyes BID  . donepezil  10 mg Oral QHS  . enoxaparin (LOVENOX) injection  40 mg Subcutaneous Daily  . furosemide  20 mg Oral Daily  . glimepiride  2 mg Oral QAC breakfast  . insulin aspart  0-9 Units Subcutaneous TID WC  . isosorbide-hydrALAZINE  1 tablet Oral BID  . pantoprazole  40 mg Oral Q1200  . pregabalin  50 mg Oral TID  . sodium chloride  3 mL Intravenous Q12H  . sodium chloride  3 mL Intravenous Q12H      LOS: 2 days   Aracelly Tencza M.D. [facility] 10/06/2012, 11:58 AM Pager: [phone]  If 7PM-7AM, please contact night-coverage [email or web address] Password [email or web address]

## 2012-10-15 ENCOUNTER — Other Ambulatory Visit: Payer: Self-pay

## 2013-01-29 ENCOUNTER — Emergency Department (HOSPITAL_COMMUNITY)
Admission: EM | Admit: 2013-01-29 | Discharge: 2013-01-29 | Disposition: A | Discharge status: Home / Self Care | Payer: Medicare Other | Attending: Emergency Medicine | Admitting: Emergency Medicine

## 2013-01-29 ENCOUNTER — Encounter (HOSPITAL_COMMUNITY): Payer: Self-pay | Admitting: Emergency Medicine

## 2013-01-29 ENCOUNTER — Emergency Department (HOSPITAL_COMMUNITY): Payer: Medicare Other

## 2013-01-29 DIAGNOSIS — Z87448 Personal history of other diseases of urinary system: Secondary | ICD-10-CM | POA: Insufficient documentation

## 2013-01-29 DIAGNOSIS — G40909 Epilepsy, unspecified, not intractable, without status epilepticus: Secondary | ICD-10-CM | POA: Insufficient documentation

## 2013-01-29 DIAGNOSIS — I5030 Unspecified diastolic (congestive) heart failure: Secondary | ICD-10-CM | POA: Insufficient documentation

## 2013-01-29 DIAGNOSIS — Z880 Allergy status to penicillin: Secondary | ICD-10-CM | POA: Insufficient documentation

## 2013-01-29 DIAGNOSIS — E119 Type 2 diabetes mellitus without complications: Secondary | ICD-10-CM | POA: Insufficient documentation

## 2013-01-29 DIAGNOSIS — Z7982 Long term (current) use of aspirin: Secondary | ICD-10-CM | POA: Insufficient documentation

## 2013-01-29 DIAGNOSIS — I2510 Atherosclerotic heart disease of native coronary artery without angina pectoris: Secondary | ICD-10-CM | POA: Insufficient documentation

## 2013-01-29 DIAGNOSIS — Z79899 Other long term (current) drug therapy: Secondary | ICD-10-CM | POA: Insufficient documentation

## 2013-01-29 DIAGNOSIS — Z8639 Personal history of other endocrine, nutritional and metabolic disease: Secondary | ICD-10-CM | POA: Insufficient documentation

## 2013-01-29 DIAGNOSIS — K219 Gastro-esophageal reflux disease without esophagitis: Secondary | ICD-10-CM | POA: Insufficient documentation

## 2013-01-29 DIAGNOSIS — F0390 Unspecified dementia without behavioral disturbance: Secondary | ICD-10-CM | POA: Insufficient documentation

## 2013-01-29 DIAGNOSIS — Z87891 Personal history of nicotine dependence: Secondary | ICD-10-CM | POA: Insufficient documentation

## 2013-01-29 DIAGNOSIS — Z8669 Personal history of other diseases of the nervous system and sense organs: Secondary | ICD-10-CM | POA: Insufficient documentation

## 2013-01-29 DIAGNOSIS — Z8673 Personal history of transient ischemic attack (TIA), and cerebral infarction without residual deficits: Secondary | ICD-10-CM | POA: Insufficient documentation

## 2013-01-29 DIAGNOSIS — Z8739 Personal history of other diseases of the musculoskeletal system and connective tissue: Secondary | ICD-10-CM | POA: Insufficient documentation

## 2013-01-29 DIAGNOSIS — M7552 Bursitis of left shoulder: Secondary | ICD-10-CM

## 2013-01-29 DIAGNOSIS — Z8719 Personal history of other diseases of the digestive system: Secondary | ICD-10-CM | POA: Insufficient documentation

## 2013-01-29 DIAGNOSIS — M67919 Unspecified disorder of synovium and tendon, unspecified shoulder: Secondary | ICD-10-CM | POA: Insufficient documentation

## 2013-01-29 DIAGNOSIS — M719 Bursopathy, unspecified: Secondary | ICD-10-CM | POA: Insufficient documentation

## 2013-01-29 DIAGNOSIS — Z862 Personal history of diseases of the blood and blood-forming organs and certain disorders involving the immune mechanism: Secondary | ICD-10-CM | POA: Insufficient documentation

## 2013-01-29 DIAGNOSIS — I10 Essential (primary) hypertension: Secondary | ICD-10-CM | POA: Insufficient documentation

## 2013-01-29 HISTORY — DX: Pain in left arm: M79.602

## 2013-01-29 HISTORY — DX: Anemia in other chronic diseases classified elsewhere: D63.8

## 2013-01-29 MED ORDER — HYDROCODONE-ACETAMINOPHEN 5-325 MG PO TABS: 1.0000 | ORAL_TABLET | ORAL | Status: DC | PRN

## 2013-01-29 MED ORDER — PREDNISONE 10 MG PO TABS: 20.0000 mg | ORAL_TABLET | Freq: Every day | ORAL | Status: DC

## 2013-01-29 NOTE — ED Notes (Signed)
Pt received from [facility] and asisted to bed 34

## 2013-01-29 NOTE — ED Notes (Signed)
Pt. Stated, I've had left arm pain for 2 weeks.  No injury as I can recall.  I thought it was how I was sleeping but not.

## 2013-01-29 NOTE — ED Provider Notes (Signed)
CSN: [id number]     Arrival date & time 01/29/13  1150 History   First MD Initiated Contact with Patient 01/29/13 1330     Chief Complaint  Patient presents with  . Shoulder Pain    left arm pain from shoulder to hand   (Consider location/radiation/quality/duration/timing/severity/associated sxs/prior Treatment) Patient is a 79 y.o. female presenting with shoulder pain. The history is provided by the patient and the spouse.  Shoulder Pain   patient here complaining of two-week history of left shoulder pain characterized as sharp pain and worse with sleeping on her left arm. Pain is also worse with movement. Denies any recent history of trauma. No numbness or tingling to her left hand. Denies any chest pain or chest pressure. No anginal type symptoms. Pain mostly is at the anterior part of her left shoulder. Has used over-the-counter medications without relief. no prior history of shoulder surgery. Patient came in today at the encouragement of her husband   Past Medical History  Diagnosis Date  . Diabetes mellitus   . GERD (gastroesophageal reflux disease)   . Hypertension   . Diverticulitis   . Gout   . Essential and other specified forms of tremor   . Dementia   . CAD (coronary artery disease)   . CHF (congestive heart failure)     Diastolic dysfunction  . Stroke   . Seizures     r/t stroke  . Anemia, chronic disease   . Left arm pain   . Acute renal failure (ARF)    Past Surgical History  Procedure Laterality Date  . Dilation and curettage of uterus    . Tumor removal     Family History  Problem Relation Age of Onset  . Colon cancer Neg Hx   . Heart disease Father   . Heart disease Brother    History  Substance Use Topics  . Smoking status: Former Games developer  . Smokeless tobacco: Never Used  . Alcohol Use: No   OB History   Grav Para Term Preterm Abortions TAB SAB Ect Mult Living                 Review of Systems  All other systems reviewed and are  negative.    Allergies  Ace inhibitors; Codeine; Fentanyl; Morphine; and Penicillins  Home Medications   Current Outpatient Rx  Name  Route  Sig  Dispense  Refill  . aspirin (ASPIRIN EC) 81 MG EC tablet   Oral   Take 1 tablet (81 mg total) by mouth daily. Swallow whole.   30 tablet   12   . bimatoprost (LUMIGAN) 0.01 % SOLN   Ophthalmic   Apply 1 drop to eye 2 (two) times daily.         Marland Kitchen donepezil (ARICEPT) 10 MG tablet   Oral   Take 10 mg by mouth at bedtime.         Marland Kitchen esomeprazole (NEXIUM) 40 MG capsule   Oral   Take 40 mg by mouth 2 (two) times daily.         . furosemide (LASIX) 20 MG tablet   Oral   Take 20 mg by mouth daily.         . isosorbide-hydrALAZINE (BIDIL) 20-37.5 MG per tablet   Oral   Take 1 tablet by mouth 2 (two) times daily.   60 tablet   3   . nitroGLYCERIN (NITROSTAT) 0.4 MG SL tablet   Sublingual   Place 1 tablet (0.4  mg total) under the tongue every 5 (five) minutes as needed for chest pain.   60 tablet   12   . Polyethyl Glycol-Propyl Glycol (SYSTANE ULTRA OP)   Both Eyes   Place 1 drop into both eyes daily as needed. For dry eyes         . pregabalin (LYRICA) 50 MG capsule   Oral   Take 50 mg by mouth 3 (three) times daily.         Marland Kitchen zolpidem (AMBIEN) 10 MG tablet   Oral   Take 10 mg by mouth at bedtime as needed for sleep.          BP 164/79  Pulse 79  Temp(Src) 97.4 F (36.3 C) (Oral)  Resp 16  SpO2 98% Physical Exam  Nursing note and vitals reviewed. Constitutional: She is oriented to person, place, and time. She appears well-developed and well-nourished.  Non-toxic appearance. No distress.  HENT:  Head: Normocephalic and atraumatic.  Eyes: Conjunctivae, EOM and lids are normal. Pupils are equal, round, and reactive to light.  Neck: Normal range of motion. Neck supple. No tracheal deviation present. No mass present.  Cardiovascular: Normal rate, regular rhythm and normal heart sounds.  Exam reveals no  gallop.   No murmur heard. Pulmonary/Chest: Effort normal and breath sounds normal. No stridor. No respiratory distress. She has no decreased breath sounds. She has no wheezes. She has no rhonchi. She has no rales.  Abdominal: Soft. Normal appearance and bowel sounds are normal. She exhibits no distension. There is no tenderness. There is no rebound and no CVA tenderness.  Musculoskeletal: She exhibits no edema and no tenderness.       Left shoulder: She exhibits decreased range of motion and pain. She exhibits no swelling.       Arms: Neurological: She is alert and oriented to person, place, and time. She has normal strength. No cranial nerve deficit or sensory deficit. GCS eye subscore is 4. GCS verbal subscore is 5. GCS motor subscore is 6.  Skin: Skin is warm and dry. No abrasion and no rash noted.  Psychiatric: She has a normal mood and affect. Her speech is normal and behavior is normal.    ED Course  Procedures (including critical care time) Labs Review Labs Reviewed - No data to display Imaging Review No results found.  MDM  No diagnosis found.  Date: 01/29/2013  Rate: 91  Rhythm: normal sinus rhythm  QRS Axis: normal  Intervals: normal  ST/T Wave abnormalities: nonspecific ST/T changes  Conduction Disutrbances:first-degree A-V block   Narrative Interpretation:   Old EKG Reviewed: none available   Pt to be treated for suspected left shoulder bursitis  Toy Baker, MD 01/29/13 1528

## 2013-01-29 NOTE — ED Notes (Signed)
Pt c/o left shoulder pain for approx 2 weeks.  No known injury. No previous history.  She just returned from xray.  Alert and oriented skin warm an dry no distress

## 2013-01-29 NOTE — ED Notes (Addendum)
Pt c/o left arm pain - states hurts from shoulder to hand. Denies injury. Swelling noted to forearm to hand. C/o tingling to fingers since yesterday. States has been occurring x 2 weeks - states is worse when lies on left side to sleep. States son gives her meds to her and she is unable to recall last time took meds but believes she took them this am.

## 2013-02-25 ENCOUNTER — Other Ambulatory Visit: Payer: Self-pay | Admitting: Ophthalmology

## 2013-02-25 NOTE — H&P (Signed)
Patient Record  LONA, SIX  Patient Number:  [id number] Date of Birth:  January 09, 1934 Age:  79 years old    Gender:  Female Date of Evaluation:  February 25, 2013  Chief Complaint:   79 year old female for Glaucoma and Cataract follow up, c/o trouble seeing and variable compliance with using IOP medications. History of Present Illness:   Patient for follow up glaucoma and dry eye syndrome.  On lumigan and systane.  Not using meds regularly.  C/O blurred vision.  Faces blurred.  Can;t see tv.  Can't recognized people's facies.  Present for evaluation. Past History:  Allergies:  Codeine, Active Penicillin, Active Morphine, Active Medications:   Other Medications:  Premarin 2 tab q d, Glyburide 1 tab q d, Metformin 1 tab BID, Toprol 1 tab q d, Asprin 1 tab q d Birth History:  none Past Ocular History:   cataract  dry eye syndrome  Past Medical History:   high blood pressure , 250.00 Diabetes mellitus, non-insulin dependent Congested Heart Failure Past Surgical History:   hysterectomy  cyst removal appendectomy  Family History:  no amblyopia, no blindness, no cataracts, no crossed eyes, no diabetic retinopathy, + glaucoma (brother), no macular degeneration, no retinal detachment, + cancer (sister), + diabetes (brother), + heart disease (brother x4), + high blood pressure (brother), + stroke (mother) Social History:   Smoking Status: never smoker  Alcohol:  none   Driving status:  not driving Marital status:  married Review of Systems:   Constitutional: + weight gain, + weight loss  Eyes: + blurred vision  Ear/Nose/Throat: + hearing loss  Cardiovascular:  + chest pain, + high cholesterol  Respiratory: + wheezing   Gastrointestinal: + abdominal pain, + nausea  Genitourinary:  no blood in urine, no discomfort    Musculoskeletal: + low back pain  Integumentary skin/breast: + rashes  Neurological: + numbness  Psychiatric:  no anxiety, no depression    Endocrine:  no heat  intolerance, no thyroid problems    Hematologic/Lymphatic:  no anemia, no unusual bleeding    Allergic/Immunologic:  no hives, no seasonal allergies    Examination:  Visual Acuity:   Distance VA cc:  OD: 20/40    OS: 20/50  Distance VA Brandon:  OD: 20/50-2    OS: 20/70 IOP:  OD:  25     OS:  26    @ 03:18PM (Goldmann applanation) Manifest Refraction:    Sphere    Cyl Axis       VA         Add       VA Prism Base R:  +1.00                20/50       +2.75                      L:  +1.50  -2.25   90    20/50       +2.75                        Confrontation visual field: OU:  Normal  Motility: OU:  Normal  Pupils: OU:  Shape, size, direct and consensual reaction normal  Adnexa:  Preauricular LN, lacrimal drainage, lacrimal glands, orbit normal  Eyelids: Eyelids:  blepharochalasis ou Conjunctiva: OU:  bulbar, palpebral normal  Cornea: OU:  spk  Anterior Chamber: OU:  2+ deep in periphery OU  Iris: OU:  normal Dilation:  OU: Tropicamide 1%/ N 2.5% @ 02:46PM  Lens: OU:  3+ nuclear sclerotic cataract  Vitreous: OU:  normal  Optic Disc:  OD:  cupping: 0.85 , inferior bare rim OS:  cupping: 0.85, inferior bare rim 2 clock hours  Macula: OU:  normal  Vessels: OU:  normal  Periphery: OU:  normal A Scan / IOLMaster:  AScan predicts a +22.50 Diopter Acrysof MA50BM for emmetropia OS  Orientation to person, place and time:  Normal  Mood and affect:  Normal  Impression:  365.11  Primary Open Angle Glaucoma OU 365.72  -Glaucoma, moderate  -  --angles narrow OU 370.33  Dry eye, keratoconjunctivitis sicca 366.19  Combined Cataract OU  Plan/Treatment:  We discussed that she has narrow angles OU. I do feel she has visually significant Cataracts OU.  There is a good chance that ehr IOP will be easier to manage after Cataract surgery as her angles will deepen.  She desires to proceed with Catraact surgery OS.  She indicated understanding our discussion and felt that her questions had been answered  to her satisfaction.  She indicates that she desires to proceed with the recommended treatment/care plan.   Medications:   Continue Lumigan 1 gtt OU QHS Systane 1 gtt OU QID and prn sx Patient Instructions: Please do not eat anything after mignight the day before surgery. Return to clinic:  3 months for IOP.  To decide on cataract surgery.   (electronically signed) Shade Flood, MD

## 2013-03-24 ENCOUNTER — Encounter (HOSPITAL_COMMUNITY)
Admission: RE | Admit: 2013-03-24 | Discharge: 2013-03-24 | Disposition: A | Discharge status: Home / Self Care | Payer: Medicare Other | Source: Ambulatory Visit | Attending: Ophthalmology | Admitting: Ophthalmology

## 2013-03-24 ENCOUNTER — Encounter (HOSPITAL_COMMUNITY)
Admission: RE | Admit: 2013-03-24 | Discharge: 2013-03-24 | Disposition: A | Discharge status: Home / Self Care | Payer: Medicare Other | Source: Ambulatory Visit | Attending: Anesthesiology | Admitting: Anesthesiology

## 2013-03-24 ENCOUNTER — Other Ambulatory Visit (HOSPITAL_COMMUNITY): Payer: Self-pay | Admitting: *Deleted

## 2013-03-24 ENCOUNTER — Encounter (HOSPITAL_COMMUNITY): Payer: Self-pay

## 2013-03-24 DIAGNOSIS — Z01812 Encounter for preprocedural laboratory examination: Secondary | ICD-10-CM | POA: Insufficient documentation

## 2013-03-24 DIAGNOSIS — I10 Essential (primary) hypertension: Secondary | ICD-10-CM | POA: Insufficient documentation

## 2013-03-24 DIAGNOSIS — E119 Type 2 diabetes mellitus without complications: Secondary | ICD-10-CM | POA: Insufficient documentation

## 2013-03-24 DIAGNOSIS — Z01818 Encounter for other preprocedural examination: Secondary | ICD-10-CM | POA: Insufficient documentation

## 2013-03-24 HISTORY — DX: Unspecified osteoarthritis, unspecified site: M19.90

## 2013-03-24 LAB — BASIC METABOLIC PANEL
CO2: 24 mEq/L (ref 19–32)
Calcium: 8.6 mg/dL (ref 8.4–10.5)
Chloride: 99 mEq/L (ref 96–112)
Glucose, Bld: 205 mg/dL — ABNORMAL HIGH (ref 70–99)
Potassium: 3.5 mEq/L (ref 3.5–5.1)
Sodium: 135 mEq/L (ref 135–145)

## 2013-03-24 LAB — CBC
Hemoglobin: 8.3 g/dL — ABNORMAL LOW (ref 12.0–15.0)
Platelets: 337 10*3/uL (ref 150–400)
RBC: 3.52 MIL/uL — ABNORMAL LOW (ref 3.87–5.11)
WBC: 6.9 10*3/uL (ref 4.0–10.5)

## 2013-03-24 NOTE — Pre-Procedure Instructions (Addendum)
Glenda Gonzalez  03/24/2013   Your procedure is scheduled on:  03/30/13  Report to [facility]  2 * 3 at 800 AM.  Call this number if you have problems the morning of surgery: [phone]   Remember:   Do not eat food or drink liquids after midnight.   Take these medicines the morning of surgery with A SIP OF WATER: eye drops nexium, pain med if needed, bidil (isosorbide-hydralazine), prednisone, lyrica   Do not wear jewelry, make-up or nail polish.  Do not wear lotions, powders, or perfumes. You may wear deodorant.  Do not shave 48 hours prior to surgery. Men may shave face and neck.  Do not bring valuables to the hospital.  [facility] is not responsible                  for any belongings or valuables.               Contacts, dentures or bridgework may not be worn into surgery.  Leave suitcase in the car. After surgery it may be brought to your room.  For patients admitted to the hospital, discharge time is determined by your                treatment team.               Patients discharged the day of surgery will not be allowed to drive  home.  Name and phone number of your driver:   Special Instructions: Shower using CHG 2 nights before surgery and the night before surgery.  If you shower the day of surgery use CHG.  Use special wash - you have one bottle of CHG for all showers.  You should use approximately 1/3 of the bottle for each shower.   Please read over the following fact sheets that you were given: Pain Booklet, Coughing and Deep Breathing and Surgical Site Infection Prevention

## 2013-03-25 NOTE — Progress Notes (Addendum)
Anesthesia Chart Review:  Patient is a 79 year old female scheduled for left eye cataract extraction on 03/30/13 by Dr. Clarisa Kindred.  History includes former smoker, GERD, HTN, anemia, CAD with 60-70% D1 otherwise no significant CAD by '02 cath, CHF (diastolic dysfunction), acute renal failure (Cr peak ~ 2.2 during 06/2012 hospitalization for angioedema), DM2, status epilepticus with lacunar CVA associated with hemorrhage in the setting of malignant HTN 05/2012, arthritis, essential tremor, dementia, diverticulitis, gout, obesity. She required nasal intubation in the ED for acute respiratory failure secondary to angioedema related to ACEI in 06/2012. She was last hospitalized in 09/2012 for hypertensive urgency.  PCP is listed as Dr. Billee Cashing.  She has been evaluated by cardiologist Dr. Shirlee Latch in the past, but not since 04/2010.  PAT Vitals: 159/93, HR 78, R 20, 93%. Meds: Norco PRN, albuterol, ASA, Lumigan, Systane eye gtts, Aricept, Nexium, isosorbide-hydralazine, Nitro PRN, Lyrica, Ambien, Benadryl PRN.  EKG on 01/29/13 showed SR with first degree AVB, occasional PACs, minimal voltage criteria for LVH, non-specific T wave abnormality, prolonged QT.  Echo on 10/06/12 showed: Left ventricle: The cavity size was normal. Systolic function was normal. The estimated ejection fraction was in the range of 55% to 60%. Wall motion was normal; there were no regional wall motion abnormalities. Pulmonary arteries: PA peak pressure: 37mm Hg (S). Trivial tricuspid regurgitation.  Nuclear stress test on 05/07/09 showed mild soft tissue attenuation but no sign of scar or ischemia, EF 71%.  Cardiac cath on 01/29/01 (done following an abnormal stress test; Dr. Armanda Magic) showed: 1. The left main coronary artery is widely patent and bifurcates into the left anterior descending artery and left circumflex artery.  2. The left anterior descending artery has a proximal 30% narrowing prior to the takeoff of the first diagonal  branch which has an ostial 60-70% narrowing. The rest of the LAD is widely patent throughout its course.  3. Left circumflex artery gives rise to one obtuse marginal branch which is widely patent and the left circumflex continues along the AV groove and is widely patent.  4. Right coronary artery is widely patent throughout its course and bifurcates into a posterior descending artery and posterolateral artery, both of which are widely patent.  Carotid duplex on 06/12/12 showed: No significant extracranial carotid artery stenosis demonstrated. Vertebrals are patent with antegrade flow. ICA/CCA ratio is 2.3 on the right and 0.7 on the left. Right ICA stenosis is in the 40-59% range by velocities, probably due to vessel tortuosity.  CXR on 03/24/13 showed: Airway thickening is present, suggesting bronchitis or reactive airways disease. Subsegmental atelectasis or scarring at the right lung base. Upper normal heart size.   Preoperative labs noted.  Cr 1.08. Glucose 205.  H/H 8.3/26.9 (up from 8.2/25.5 on 10/06/12).  I was unable to reach patient at the two phone numbers listed X 2 today.  I will attempt to contact her on Monday.  I have requested records from her PCP and will review once available.  Velna Ochs [facility] Short Stay Center/Anesthesiology Phone [phone] 03/25/2013 4:06 PM  Addendum: 03/28/13 5:45 PM I reviewed above with anesthesiologist Dr. Krista Blue who agrees that if patient is asymptomatic from a cardiopulmonary and anemia standpoint then it is anticipated that she can proceed as planned.  I did call to speak with patient earlier today and was asked to call after 4:30PM.  I tried again just now and she is still not home but has my number to call back.  If patient  does not call me back then she will be further evaluated by her assigned anesthesiologist on the day of surgery.  PCP records are still pending.

## 2013-03-30 ENCOUNTER — Ambulatory Visit (HOSPITAL_COMMUNITY)
Admission: RE | Admit: 2013-03-30 | Discharge: 2013-03-30 | Disposition: A | Discharge status: Home / Self Care | Payer: Medicare Other | Source: Ambulatory Visit | Attending: Ophthalmology | Admitting: Ophthalmology

## 2013-03-30 ENCOUNTER — Encounter (HOSPITAL_COMMUNITY)
Admission: RE | Disposition: A | Discharge status: Home / Self Care | Payer: Self-pay | Source: Ambulatory Visit | Attending: Ophthalmology

## 2013-03-30 ENCOUNTER — Ambulatory Visit (HOSPITAL_COMMUNITY): Payer: Medicare Other | Admitting: Certified Registered Nurse Anesthetist

## 2013-03-30 ENCOUNTER — Encounter (HOSPITAL_COMMUNITY): Payer: Self-pay | Admitting: *Deleted

## 2013-03-30 ENCOUNTER — Encounter (HOSPITAL_COMMUNITY): Payer: Medicare Other | Admitting: Vascular Surgery

## 2013-03-30 DIAGNOSIS — E119 Type 2 diabetes mellitus without complications: Secondary | ICD-10-CM | POA: Insufficient documentation

## 2013-03-30 DIAGNOSIS — H409 Unspecified glaucoma: Secondary | ICD-10-CM | POA: Insufficient documentation

## 2013-03-30 DIAGNOSIS — I509 Heart failure, unspecified: Secondary | ICD-10-CM | POA: Insufficient documentation

## 2013-03-30 DIAGNOSIS — H269 Unspecified cataract: Secondary | ICD-10-CM | POA: Insufficient documentation

## 2013-03-30 HISTORY — PX: CATARACT EXTRACTION W/PHACO: SHX586

## 2013-03-30 LAB — GLUCOSE, CAPILLARY
Glucose-Capillary: 243 mg/dL — ABNORMAL HIGH (ref 70–99)
Glucose-Capillary: 216 mg/dL — ABNORMAL HIGH (ref 70–99)

## 2013-03-30 SURGERY — PHACOEMULSIFICATION, CATARACT, WITH IOL INSERTION
Anesthesia: Monitor Anesthesia Care | Site: Eye | Laterality: Left | Wound class: Clean

## 2013-03-30 MED ORDER — STERILE WATER FOR IRRIGATION IR SOLN
Status: DC | PRN
  Administered 2013-03-30: 200 mL

## 2013-03-30 MED ORDER — PROPOFOL 10 MG/ML IV BOLUS: INTRAVENOUS | Status: DC | PRN

## 2013-03-30 MED ORDER — NA CHONDROIT SULF-NA HYALURON 40-30 MG/ML IO SOLN
INTRAOCULAR | Status: AC
  Filled 2013-03-30: qty 0.5

## 2013-03-30 MED ORDER — DEXAMETHASONE SODIUM PHOSPHATE 10 MG/ML IJ SOLN
INTRAMUSCULAR | Status: AC
  Filled 2013-03-30: qty 1

## 2013-03-30 MED ORDER — NA CHONDROIT SULF-NA HYALURON 40-30 MG/ML IO SOLN
INTRAOCULAR | Status: DC | PRN
  Administered 2013-03-30: 0.5 mL via INTRAOCULAR

## 2013-03-30 MED ORDER — HYPROMELLOSE (GONIOSCOPIC) 2.5 % OP SOLN
OPHTHALMIC | Status: AC
  Filled 2013-03-30: qty 15

## 2013-03-30 MED ORDER — SODIUM CHLORIDE 0.9 % IV SOLN
INTRAVENOUS | Status: DC | PRN
  Administered 2013-03-30: 11:00:00 via INTRAVENOUS

## 2013-03-30 MED ORDER — BACITRACIN-POLYMYXIN B 500-10000 UNIT/GM OP OINT
TOPICAL_OINTMENT | OPHTHALMIC | Status: AC
  Filled 2013-03-30: qty 3.5

## 2013-03-30 MED ORDER — SODIUM CHLORIDE 0.9 % IV SOLN: INTRAVENOUS | Status: DC | PRN

## 2013-03-30 MED ORDER — DEXAMETHASONE SODIUM PHOSPHATE 10 MG/ML IJ SOLN
INTRAMUSCULAR | Status: DC | PRN
  Administered 2013-03-30: 20 mg via INTRAVENOUS

## 2013-03-30 MED ORDER — PREDNISOLONE ACETATE 1 % OP SUSP: 1.0000 [drp] | OPHTHALMIC | Status: AC

## 2013-03-30 MED ORDER — ISOSORB DINITRATE-HYDRALAZINE 20-37.5 MG PO TABS
1.0000 | ORAL_TABLET | Freq: Once | ORAL | Status: AC
  Administered 2013-03-30: 1 via ORAL
  Filled 2013-03-30 (×2): qty 1

## 2013-03-30 MED ORDER — SODIUM HYALURONATE 10 MG/ML IO SOLN
INTRAOCULAR | Status: AC
  Filled 2013-03-30: qty 0.85

## 2013-03-30 MED ORDER — GATIFLOXACIN 0.5 % OP SOLN: 1.0000 [drp] | OPHTHALMIC | Status: AC | PRN

## 2013-03-30 MED ORDER — HYPROMELLOSE (GONIOSCOPIC) 2.5 % OP SOLN
OPHTHALMIC | Status: DC | PRN
  Administered 2013-03-30: 2 [drp] via OPHTHALMIC

## 2013-03-30 MED ORDER — TETRACAINE HCL 0.5 % OP SOLN: 2.0000 [drp] | OPHTHALMIC | Status: AC

## 2013-03-30 MED ORDER — BUPIVACAINE HCL (PF) 0.75 % IJ SOLN
INTRAMUSCULAR | Status: AC
  Filled 2013-03-30: qty 10

## 2013-03-30 MED ORDER — BSS IO SOLN
INTRAOCULAR | Status: AC
  Filled 2013-03-30: qty 500

## 2013-03-30 MED ORDER — PHENYLEPHRINE HCL 2.5 % OP SOLN
OPHTHALMIC | Status: AC
  Filled 2013-03-30: qty 15

## 2013-03-30 MED ORDER — LIDOCAINE HCL (CARDIAC) 20 MG/ML IV SOLN: INTRAVENOUS | Status: DC | PRN

## 2013-03-30 MED ORDER — GATIFLOXACIN 0.5 % OP SOLN
OPHTHALMIC | Status: AC
  Filled 2013-03-30: qty 2.5

## 2013-03-30 MED ORDER — PHENYLEPHRINE HCL 2.5 % OP SOLN
1.0000 [drp] | OPHTHALMIC | Status: AC | PRN
  Administered 2013-03-30 (×3): 1 [drp] via OPHTHALMIC

## 2013-03-30 MED ORDER — 0.9 % SODIUM CHLORIDE (POUR BTL) OPTIME
TOPICAL | Status: DC | PRN
  Administered 2013-03-30: 200 mL

## 2013-03-30 MED ORDER — LIDOCAINE HCL 2 % IJ SOLN
INTRAMUSCULAR | Status: DC | PRN
  Administered 2013-03-30: 11:00:00 via RETROBULBAR

## 2013-03-30 MED ORDER — TETRACAINE HCL 0.5 % OP SOLN
2.0000 [drp] | OPHTHALMIC | Status: AC
  Administered 2013-03-30: 2 [drp] via OPHTHALMIC
  Filled 2013-03-30: qty 2

## 2013-03-30 MED ORDER — PROVISC 10 MG/ML IO SOLN
INTRAOCULAR | Status: DC | PRN
  Administered 2013-03-30: 0.85 mL via INTRAOCULAR

## 2013-03-30 MED ORDER — VANCOMYCIN SUBCONJUNCTIVAL INJECTION 25 MG/0.5 ML
50.0000 mg | INTRAOCULAR | Status: DC
  Filled 2013-03-30: qty 1

## 2013-03-30 MED ORDER — PROPOFOL INFUSION 10 MG/ML OPTIME
INTRAVENOUS | Status: DC | PRN
  Administered 2013-03-30: 50 ug/kg/min via INTRAVENOUS

## 2013-03-30 MED ORDER — EPINEPHRINE HCL 1 MG/ML IJ SOLN
INTRAOCULAR | Status: DC | PRN
  Administered 2013-03-30: 11:00:00

## 2013-03-30 MED ORDER — PREDNISOLONE ACETATE 1 % OP SUSP
1.0000 [drp] | OPHTHALMIC | Status: AC
  Administered 2013-03-30: 1 [drp] via OPHTHALMIC
  Filled 2013-03-30: qty 5

## 2013-03-30 MED ORDER — GATIFLOXACIN 0.5 % OP SOLN
1.0000 [drp] | OPHTHALMIC | Status: AC | PRN
  Administered 2013-03-30 (×3): 1 [drp] via OPHTHALMIC

## 2013-03-30 MED ORDER — VANCOMYCIN INTRAVITREAL INJECTION 1 MG/0.1 ML
INTRAOCULAR | Status: DC | PRN
  Administered 2013-03-30: 1 mg via INTRAVITREAL

## 2013-03-30 MED ORDER — PROPOFOL 10 MG/ML IV BOLUS
INTRAVENOUS | Status: DC | PRN
  Administered 2013-03-30: 40 mg via INTRAVENOUS

## 2013-03-30 MED ORDER — FENTANYL CITRATE 0.05 MG/ML IJ SOLN: INTRAMUSCULAR | Status: DC | PRN

## 2013-03-30 MED ORDER — PHENYLEPHRINE HCL 2.5 % OP SOLN: 1.0000 [drp] | OPHTHALMIC | Status: AC | PRN

## 2013-03-30 MED ORDER — BACITRACIN-POLYMYXIN B 500-10000 UNIT/GM OP OINT
TOPICAL_OINTMENT | OPHTHALMIC | Status: DC | PRN
  Administered 2013-03-30: 1 via OPHTHALMIC

## 2013-03-30 MED ORDER — EPINEPHRINE HCL 1 MG/ML IJ SOLN
INTRAMUSCULAR | Status: AC
  Filled 2013-03-30: qty 1

## 2013-03-30 SURGICAL SUPPLY — 53 items
APL SRG 3 HI ABS STRL LF PLS (MISCELLANEOUS) ×1
APPLICATOR COTTON TIP 6IN STRL (MISCELLANEOUS) ×2 IMPLANT
APPLICATOR DR MATTHEWS STRL (MISCELLANEOUS) ×2 IMPLANT
BLADE KERATOME 2.75 (BLADE) ×2 IMPLANT
BLADE STAB KNIFE 15DEG (BLADE) IMPLANT
COVER MAYO STAND STRL (DRAPES) ×2 IMPLANT
DRAPE OPHTHALMIC 77X100 STRL (CUSTOM PROCEDURE TRAY) ×2 IMPLANT
DRAPE POUCH INSTRU U-SHP 10X18 (DRAPES) ×2 IMPLANT
DRSG TEGADERM 4X4.75 (GAUZE/BANDAGES/DRESSINGS) ×2 IMPLANT
FILTER BLUE MILLIPORE (MISCELLANEOUS) IMPLANT
GLOVE BIOGEL PI IND STRL 7.0 (GLOVE) IMPLANT
GLOVE BIOGEL PI INDICATOR 7.0 (GLOVE) ×1
GLOVE SS BIOGEL STRL SZ 6.5 (GLOVE) ×1 IMPLANT
GLOVE SUPERSENSE BIOGEL SZ 6.5 (GLOVE) ×2
GLOVE SURG SS PI 7.0 STRL IVOR (GLOVE) ×1 IMPLANT
GOWN SRG XL XLNG 56XLVL 4 (GOWN DISPOSABLE) ×1 IMPLANT
GOWN STRL NON-REIN LRG LVL3 (GOWN DISPOSABLE) ×2 IMPLANT
GOWN STRL NON-REIN XL XLG LVL4 (GOWN DISPOSABLE) ×2
KIT BASIN OR (CUSTOM PROCEDURE TRAY) ×2 IMPLANT
KIT ROOM TURNOVER OR (KITS) ×2 IMPLANT
KNIFE GRIESHABER SHARP 2.5MM (MISCELLANEOUS) ×2 IMPLANT
LENS IOL ACRYSOF MP POST 22.5 (Intraocular Lens) ×1 IMPLANT
MASK EYE SHIELD (GAUZE/BANDAGES/DRESSINGS) ×1 IMPLANT
NDL 25GX 5/8IN NON SAFETY (NEEDLE) ×2 IMPLANT
NDL HYPO 30X.5 LL (NEEDLE) ×2 IMPLANT
NEEDLE 22X1 1/2 (OR ONLY) (NEEDLE) ×1 IMPLANT
NEEDLE 25GX 5/8IN NON SAFETY (NEEDLE) ×4 IMPLANT
NEEDLE HYPO 30X.5 LL (NEEDLE) ×4 IMPLANT
NS IRRIG 1000ML POUR BTL (IV SOLUTION) ×2 IMPLANT
PACK CATARACT CUSTOM (CUSTOM PROCEDURE TRAY) ×2 IMPLANT
PAD ARMBOARD 7.5X6 YLW CONV (MISCELLANEOUS) ×2 IMPLANT
PAD EYE OVAL STERILE LF (GAUZE/BANDAGES/DRESSINGS) ×1 IMPLANT
PAK PIK CVS CATARACT (OPHTHALMIC) ×2 IMPLANT
PROBE ANTERIOR 20G W/INFUS NDL (MISCELLANEOUS) IMPLANT
SHUTTLE MONARCH TYPE A (NEEDLE) ×2 IMPLANT
SPEAR EYE SURG WECK-CEL (MISCELLANEOUS) IMPLANT
SUT ETHILON 10-0 CS-B-6CS-B-6 (SUTURE)
SUT ETHILON 5 0 P 3 18 (SUTURE)
SUT ETHILON 9 0 TG140 8 (SUTURE) IMPLANT
SUT NYLON ETHILON 5-0 P-3 1X18 (SUTURE) IMPLANT
SUT PLAIN 6 0 TG1408 (SUTURE) IMPLANT
SUT POLY NON ABSORB 10-0 8 STR (SUTURE) IMPLANT
SUT VICRYL 6 0 S 29 12 (SUTURE) IMPLANT
SUTURE EHLN 10-0 CS-B-6CS-B-6 (SUTURE) IMPLANT
SYR 20CC LL (SYRINGE) IMPLANT
SYR TB 1ML LUER SLIP (SYRINGE) ×1 IMPLANT
SYRINGE 10CC LL (SYRINGE) IMPLANT
TAPE PAPER MEDFIX 1IN X 10YD (GAUZE/BANDAGES/DRESSINGS) ×1 IMPLANT
TIP ABS 45DEG FLARED 0.9MM (TIP) ×2 IMPLANT
TOWEL OR 17X24 6PK STRL BLUE (TOWEL DISPOSABLE) ×4 IMPLANT
WATER STERILE IRR 1000ML POUR (IV SOLUTION) ×2 IMPLANT
WIPE INSTRUMENT ADHESIVE BACK (MISCELLANEOUS) ×2 IMPLANT
WIPE INSTRUMENT VISIWIPE 73X73 (MISCELLANEOUS) ×2 IMPLANT

## 2013-03-30 NOTE — Anesthesia Procedure Notes (Signed)
Date/Time: 03/30/2013 10:39 AM Performed by: Orvilla Fus A Pre-anesthesia Checklist: Patient identified, Timeout performed, Emergency Drugs available, Suction available and Patient being monitored Oxygen Delivery Method: Nasal cannula Placement Confirmation: positive ETCO2

## 2013-03-30 NOTE — H&P (Signed)
Patient Record  Glenda Gonzalez, BLUMBERG  Patient Number: [id number]  Date of Birth: 1933/07/26  Age: 79 years old Gender: Female  Date of Evaluation: March 30, 2013  Chief Complaint: 79 year old female for Glaucoma and Cataract follow up, c/o trouble seeing and variable compliance with using IOP medications.  History of Present Illness: Patient for follow up glaucoma and dry eye syndrome. On lumigan and systane. Not using meds regularly. C/O blurred vision. Faces blurred. Can;t see tv. Can't recognized people's facies. Present for evaluation.  BP 263/122  Pulse 85  Temp(Src) 97.1 F (36.2 C) (Oral)  Resp 20  SpO2 97%  Past History:  Allergies: Codeine, Active  Penicillin, Active  Morphine, Active  Medications:  Other Medications: Premarin 2 tab q d, Glyburide 1 tab q d, Metformin 1 tab BID, Toprol 1 tab q d, Asprin 1 tab q d  Birth History: none  Past Ocular History:  cataract  dry eye syndrome  Past Medical History:  high blood pressure  , 250.00 Diabetes mellitus, non-insulin dependent  Congested Heart Failure  Past Surgical History:  hysterectomy  cyst removal  appendectomy  Family History: no amblyopia, no blindness, no cataracts, no crossed eyes, no diabetic retinopathy, + glaucoma (brother), no macular degeneration, no retinal detachment, + cancer (sister), + diabetes (brother), + heart disease (brother x4), + high blood pressure (brother), + stroke (mother)  Social History:  Smoking Status: never smoker  Alcohol: none  Driving status: not driving  Marital status: married  Review of Systems:  Constitutional: + weight gain, + weight loss  Eyes: + blurred vision  Ear/Nose/Throat: + hearing loss  Cardiovascular: + chest pain, + high cholesterol  Respiratory: + wheezing  Gastrointestinal: + abdominal pain, + nausea  Genitourinary: no blood in urine, no discomfort  Musculoskeletal: + low back pain  Integumentary skin/breast: + rashes  Neurological: + numbness   Psychiatric: no anxiety, no depression  Endocrine: no heat intolerance, no thyroid problems  Hematologic/Lymphatic: no anemia, no unusual bleeding  Allergic/Immunologic: no hives, no seasonal allergies  Examination:  Visual Acuity:  Distance VA cc: OD: 20/40 OS: 20/50  Distance VA Prentiss: OD: 20/50-2 OS: 20/70  IOP: OD: 25 OS: 26 @ 03:18PM (Goldmann applanation)  Manifest Refraction: done 02/25/2013 Sphere Cyl Axis VA Add VA Prism Base  R: +1.00 20/50 +2.75  L: +1.50 -2.25 90 20/50 +2.75  Confrontation visual field: OU: Normal  Motility: OU: Normal  Pupils: OU: Shape, size, direct and consensual reaction normal  Adnexa:  Preauricular LN, lacrimal drainage, lacrimal glands, orbit normal  Eyelids: Eyelids: blepharochalasis ou  Conjunctiva: OU: bulbar, palpebral normal  Cornea: OU: spk  Anterior Chamber: OU: 2+ deep in periphery OU  Iris: OU: normal  Dilation: OU: Tropicamide 1%/ N 2.5% @ 02:46PM  Lens: OU: 3+ nuclear sclerotic cataract  Vitreous: OU: normal  Optic Disc:  OD: cupping: 0.85 , inferior bare rim  OS: cupping: 0.85, inferior bare rim 2 clock hours  Macula: OU: normal  Vessels: OU: normal  Periphery: OU: normal   A Scan / IOLMaster:  AScan predicts a +22.50 Diopter Acrysof [id number] for emmetropia OS   Orientation to person, place and time: Normal  Mood and affect: Normal  Impression:  365.11 Primary Open Angle Glaucoma OU  365.72 -Glaucoma, moderate  - --angles narrow OU  370.33 Dry eye, keratoconjunctivitis sicca  366.19 Combined Cataract OU  Plan/Treatment:  We discussed that she has narrow angles OU. I do feel she has visually significant Cataracts OU. There  is a good chance that ehr IOP will be easier to manage after Cataract surgery as her angles will deepen. She desires to proceed with Catraact surgery OS.  She indicated understanding our discussion and felt that her questions had been answered to her satisfaction.  She indicates that she desires to proceed  with the recommended treatment/care plan.  Medications:  Continue Lumigan 1 gtt OU QHS  Systane 1 gtt OU QID and prn sx   The patient has no new complaints and no changes with rgeard to eye findings. There are no contraindications to Monitored Local Anesthesia.  (electronically signed)  Shade Flood, MD

## 2013-03-30 NOTE — Anesthesia Preprocedure Evaluation (Signed)
Anesthesia Evaluation  Patient identified by MRN, date of birth, ID band Patient awake    Reviewed: Allergy & Precautions, H&P , NPO status , Patient's Chart, lab work & pertinent test results  History of Anesthesia Complications Negative for: history of anesthetic complications  Airway Mallampati: I TM Distance: >3 FB Neck ROM: Full    Dental  (+) Edentulous Upper, Edentulous Lower and Dental Advisory Given   Pulmonary pneumonia -, resolved,          Cardiovascular hypertension, Pt. on medications + CAD and +CHF     Neuro/Psych Seizures -,  CVA    GI/Hepatic   Endo/Other  diabetes, Type 2  Renal/GU Renal disease     Musculoskeletal   Abdominal   Peds  Hematology   Anesthesia Other Findings   Reproductive/Obstetrics                           Anesthesia Physical Anesthesia Plan  ASA: IV  Anesthesia Plan: MAC   Post-op Pain Management:    Induction:   Airway Management Planned:   Additional Equipment:   Intra-op Plan:   Post-operative Plan:   Informed Consent: I have reviewed the patients History and Physical, chart, labs and discussed the procedure including the risks, benefits and alternatives for the proposed anesthesia with the patient or authorized representative who has indicated his/her understanding and acceptance.   Dental advisory given  Plan Discussed with: CRNA and Anesthesiologist  Anesthesia Plan Comments:         Anesthesia Quick Evaluation

## 2013-03-30 NOTE — Op Note (Signed)
Glenda Gonzalez 03/30/2013 Cataract: Combined, Nuclear  Procedure: Phacoemulsification, Posterior Chamber Intra-ocular Lens Operative Eye:  left eye  Surgeon: Shade Flood Estimated Blood Loss: minimal Specimens for Pathology:  None Complications: none  The patient was prepared and draped in the usual manner for ocular surgery on the left eye. A Cook lid speculum was placed. A peripheral clear corneal incision was made at the surgical limbus centered at the 11:00 meridian. A separate clear corneal stab incision was made with a 15 degree blade at the 2:00 meridian to permit bi-manual technique. Viscoat and  Provisc as an underlying layer next to the capsule was instilled into the anterior chamber through that incision.  A keratome was used to create a self sealing incision entering the anterior chamber at the 11:00 meridian. A capsulorhexis was performed using a bent 25g needle. The lens was hydrodissected and the nucleus was hydrodilineated using a Nichammin cannula. The Chang chopper was inserted and used to rotate the lens to insure adequate lens mobility. The phacoemulsification handpiece was inserted and a combined phaco-chop technique was employed, fracturing the lens into separate sections with subsequent removal with the phaco handpiece.   The I/A cannula was used to remove remaining lens cortex. Provisc was instilled and used to deepen the anterior chamber and posterior capsule bag. The Monarch injector was used to place a folded Acrysof [id number] PC IOL, + 22.50  diopters, into the capsule bag. A Sinskey lens hook was used to dial in the trailing haptic.  The I/A cannula was used to remove the viscoelastic from the anterior chamber. BSS was used to bring IOP to the desired range and the wound was checked to insure it was watertight. Subconjunctival injections of Ancef 100/0.72ml and Dexamethasone 0.5 ml of a 10mg /14ml solution were placed without complication. The lid speculum and drapes were  removed and the patient's eye was patched with Polymixin/Bacitracin ophthalmic ointment. An eye shield was placed and the patient was transferred alert and conversant from the operating room to the post-operative recovery area.   Shade Flood, MD

## 2013-03-30 NOTE — Transfer of Care (Signed)
Immediate Anesthesia Transfer of Care Note  Patient: Glenda Gonzalez  Procedure(s) Performed: Procedure(s): CATARACT EXTRACTION PHACO AND INTRAOCULAR LENS PLACEMENT (IOC) (Left)  Patient Location: PACU  Anesthesia Type:MAC  Level of Consciousness: awake, alert  and oriented  Airway & Oxygen Therapy: Patient Spontanous Breathing and Patient connected to nasal cannula oxygen  Post-op Assessment: Report given to PACU RN, Post -op Vital signs reviewed and stable and Patient moving all extremities  Post vital signs: Reviewed and stable  Complications: No apparent anesthesia complications

## 2013-03-30 NOTE — Anesthesia Postprocedure Evaluation (Signed)
Anesthesia Post Note  Patient: Glenda Gonzalez  Procedure(s) Performed: Procedure(s) (LRB): CATARACT EXTRACTION PHACO AND INTRAOCULAR LENS PLACEMENT (IOC) (Left)  Anesthesia type: general  Patient location: PACU  Post pain: Pain level controlled  Post assessment: Patient's Cardiovascular Status Stable  Last Vitals:  Filed Vitals:   03/30/13 1221  BP:   Pulse: 94  Temp:   Resp: 15    Post vital signs: Reviewed and stable  Level of consciousness: sedated  Complications: No apparent anesthesia complications

## 2013-03-30 NOTE — Progress Notes (Signed)
Pt has elevated BP in both arms 259/103 on the right and 263/122 on left.  Pt did not take her home BP med this am. Dr Krista Blue notified and order for home med received.Marland Kitchen

## 2013-03-30 NOTE — Progress Notes (Signed)
Dr. Michelle Piper paged to patients bedside to evaluated upper airway wheeze and need of oxygen with saturations in low 90's.  Dr. Michelle Piper auscultated her chest, and stated that the patient had clear lungs.  Patient was discharged to her husband with the admonition that she should be brought back to the hospital if she began to feel poorly.

## 2013-03-31 ENCOUNTER — Encounter (HOSPITAL_COMMUNITY): Payer: Self-pay | Admitting: Ophthalmology

## 2013-05-24 ENCOUNTER — Emergency Department (HOSPITAL_COMMUNITY): Payer: Medicare Other

## 2013-05-24 ENCOUNTER — Encounter (HOSPITAL_COMMUNITY): Payer: Self-pay | Admitting: Emergency Medicine

## 2013-05-24 ENCOUNTER — Inpatient Hospital Stay (HOSPITAL_COMMUNITY)
Admission: EM | Admit: 2013-05-24 | Discharge: 2013-05-29 | DRG: 638 | Disposition: A | Discharge status: Home / Self Care | Payer: Medicare Other | Attending: Internal Medicine | Admitting: Internal Medicine

## 2013-05-24 DIAGNOSIS — R4182 Altered mental status, unspecified: Secondary | ICD-10-CM

## 2013-05-24 DIAGNOSIS — F0390 Unspecified dementia without behavioral disturbance: Secondary | ICD-10-CM | POA: Diagnosis present

## 2013-05-24 DIAGNOSIS — D638 Anemia in other chronic diseases classified elsewhere: Secondary | ICD-10-CM | POA: Diagnosis present

## 2013-05-24 DIAGNOSIS — F068 Other specified mental disorders due to known physiological condition: Secondary | ICD-10-CM

## 2013-05-24 DIAGNOSIS — J9601 Acute respiratory failure with hypoxia: Secondary | ICD-10-CM

## 2013-05-24 DIAGNOSIS — I447 Left bundle-branch block, unspecified: Secondary | ICD-10-CM | POA: Diagnosis present

## 2013-05-24 DIAGNOSIS — E1110 Type 2 diabetes mellitus with ketoacidosis without coma: Secondary | ICD-10-CM

## 2013-05-24 DIAGNOSIS — I509 Heart failure, unspecified: Secondary | ICD-10-CM | POA: Diagnosis present

## 2013-05-24 DIAGNOSIS — E870 Hyperosmolality and hypernatremia: Secondary | ICD-10-CM | POA: Diagnosis present

## 2013-05-24 DIAGNOSIS — E119 Type 2 diabetes mellitus without complications: Secondary | ICD-10-CM

## 2013-05-24 DIAGNOSIS — Z880 Allergy status to penicillin: Secondary | ICD-10-CM

## 2013-05-24 DIAGNOSIS — I129 Hypertensive chronic kidney disease with stage 1 through stage 4 chronic kidney disease, or unspecified chronic kidney disease: Secondary | ICD-10-CM | POA: Diagnosis present

## 2013-05-24 DIAGNOSIS — Z888 Allergy status to other drugs, medicaments and biological substances status: Secondary | ICD-10-CM

## 2013-05-24 DIAGNOSIS — Z8673 Personal history of transient ischemic attack (TIA), and cerebral infarction without residual deficits: Secondary | ICD-10-CM

## 2013-05-24 DIAGNOSIS — Z87891 Personal history of nicotine dependence: Secondary | ICD-10-CM

## 2013-05-24 DIAGNOSIS — N179 Acute kidney failure, unspecified: Secondary | ICD-10-CM

## 2013-05-24 DIAGNOSIS — A0472 Enterocolitis due to Clostridium difficile, not specified as recurrent: Secondary | ICD-10-CM

## 2013-05-24 DIAGNOSIS — R197 Diarrhea, unspecified: Secondary | ICD-10-CM | POA: Diagnosis present

## 2013-05-24 DIAGNOSIS — Z961 Presence of intraocular lens: Secondary | ICD-10-CM

## 2013-05-24 DIAGNOSIS — I10 Essential (primary) hypertension: Secondary | ICD-10-CM | POA: Diagnosis present

## 2013-05-24 DIAGNOSIS — J101 Influenza due to other identified influenza virus with other respiratory manifestations: Secondary | ICD-10-CM | POA: Diagnosis present

## 2013-05-24 DIAGNOSIS — Z9849 Cataract extraction status, unspecified eye: Secondary | ICD-10-CM

## 2013-05-24 DIAGNOSIS — R05 Cough: Secondary | ICD-10-CM

## 2013-05-24 DIAGNOSIS — N390 Urinary tract infection, site not specified: Secondary | ICD-10-CM | POA: Diagnosis present

## 2013-05-24 DIAGNOSIS — E1010 Type 1 diabetes mellitus with ketoacidosis without coma: Principal | ICD-10-CM | POA: Diagnosis present

## 2013-05-24 DIAGNOSIS — N183 Chronic kidney disease, stage 3 unspecified: Secondary | ICD-10-CM | POA: Diagnosis present

## 2013-05-24 DIAGNOSIS — Z8249 Family history of ischemic heart disease and other diseases of the circulatory system: Secondary | ICD-10-CM

## 2013-05-24 DIAGNOSIS — E876 Hypokalemia: Secondary | ICD-10-CM | POA: Diagnosis present

## 2013-05-24 DIAGNOSIS — K219 Gastro-esophageal reflux disease without esophagitis: Secondary | ICD-10-CM | POA: Diagnosis present

## 2013-05-24 DIAGNOSIS — J111 Influenza due to unidentified influenza virus with other respiratory manifestations: Secondary | ICD-10-CM | POA: Diagnosis present

## 2013-05-24 DIAGNOSIS — M109 Gout, unspecified: Secondary | ICD-10-CM | POA: Diagnosis present

## 2013-05-24 DIAGNOSIS — Z79899 Other long term (current) drug therapy: Secondary | ICD-10-CM

## 2013-05-24 DIAGNOSIS — I639 Cerebral infarction, unspecified: Secondary | ICD-10-CM

## 2013-05-24 DIAGNOSIS — M79602 Pain in left arm: Secondary | ICD-10-CM

## 2013-05-24 DIAGNOSIS — I2510 Atherosclerotic heart disease of native coronary artery without angina pectoris: Secondary | ICD-10-CM | POA: Diagnosis present

## 2013-05-24 DIAGNOSIS — E872 Acidosis: Secondary | ICD-10-CM | POA: Diagnosis present

## 2013-05-24 LAB — URINALYSIS, ROUTINE W REFLEX MICROSCOPIC
Bilirubin Urine: NEGATIVE
Glucose, UA: >1000 mg/dL — AB
Hgb urine dipstick: NEGATIVE
Ketones, ur: NEGATIVE mg/dL
Nitrite: POSITIVE — AB
Specific Gravity, Urine: 1.023 (ref 1.005–1.030)
pH: 5 (ref 5.0–8.0)

## 2013-05-24 LAB — CBC
HCT: 28.8 % — ABNORMAL LOW (ref 36.0–46.0)
MCH: 22.1 pg — ABNORMAL LOW (ref 26.0–34.0)
MCHC: 29.9 g/dL — ABNORMAL LOW (ref 30.0–36.0)
MCV: 73.8 fL — ABNORMAL LOW (ref 78.0–100.0)
Platelets: 222 10*3/uL (ref 150–400)
RDW: 21.4 % — ABNORMAL HIGH (ref 11.5–15.5)
WBC: 7.5 10*3/uL (ref 4.0–10.5)

## 2013-05-24 LAB — CBC WITH DIFFERENTIAL/PLATELET
Eosinophils Absolute: 0 10*3/uL (ref 0.0–0.7)
Eosinophils Relative: 0 % (ref 0–5)
HCT: 31.1 % — ABNORMAL LOW (ref 36.0–46.0)
Hemoglobin: 9.3 g/dL — ABNORMAL LOW (ref 12.0–15.0)
Lymphs Abs: 1 10*3/uL (ref 0.7–4.0)
MCH: 22 pg — ABNORMAL LOW (ref 26.0–34.0)
MCV: 73.5 fL — ABNORMAL LOW (ref 78.0–100.0)
Monocytes Absolute: 0.6 10*3/uL (ref 0.1–1.0)
Monocytes Relative: 6 % (ref 3–12)
Neutro Abs: 7.5 10*3/uL (ref 1.7–7.7)
Neutrophils Relative %: 83 % — ABNORMAL HIGH (ref 43–77)
RBC: 4.23 MIL/uL (ref 3.87–5.11)
RDW: 21.6 % — ABNORMAL HIGH (ref 11.5–15.5)

## 2013-05-24 LAB — BASIC METABOLIC PANEL
BUN: 43 mg/dL — ABNORMAL HIGH (ref 6–23)
CO2: 22 mEq/L (ref 19–32)
Calcium: 8.6 mg/dL (ref 8.4–10.5)
Calcium: 8 mg/dL — ABNORMAL LOW (ref 8.4–10.5)
Chloride: 98 mEq/L (ref 96–112)
Chloride: 109 mEq/L (ref 96–112)
Creatinine, Ser: 2.13 mg/dL — ABNORMAL HIGH (ref 0.50–1.10)
Creatinine, Ser: 1.96 mg/dL — ABNORMAL HIGH (ref 0.50–1.10)
GFR calc Af Amer: 27 mL/min — ABNORMAL LOW (ref >90)
GFR calc non Af Amer: 21 mL/min — ABNORMAL LOW (ref >90)
GFR calc non Af Amer: 23 mL/min — ABNORMAL LOW (ref >90)
Glucose, Bld: 670 mg/dL (ref 70–99)
Sodium: 140 mEq/L (ref 137–147)
Sodium: 148 mEq/L — ABNORMAL HIGH (ref 137–147)

## 2013-05-24 LAB — POCT I-STAT 3, VENOUS BLOOD GAS (G3P V)
Acid-base deficit: 4 mmol/L — ABNORMAL HIGH (ref 0.0–2.0)
Bicarbonate: 21.2 mEq/L (ref 20.0–24.0)
O2 Saturation: 57 %
Patient temperature: 98.2
pCO2, Ven: 40 mmHg — ABNORMAL LOW (ref 45.0–50.0)
pO2, Ven: 31 mmHg (ref 30.0–45.0)

## 2013-05-24 LAB — TROPONIN I: Troponin I: <0.3 ng/mL (ref <0.30)

## 2013-05-24 LAB — URINE MICROSCOPIC-ADD ON

## 2013-05-24 LAB — GLUCOSE, CAPILLARY
Glucose-Capillary: >600 mg/dL (ref 70–99)
Glucose-Capillary: 546 mg/dL — ABNORMAL HIGH (ref 70–99)
Glucose-Capillary: 462 mg/dL — ABNORMAL HIGH (ref 70–99)

## 2013-05-24 LAB — POCT I-STAT TROPONIN I: Troponin i, poc: 0.08 ng/mL (ref 0.00–0.08)

## 2013-05-24 MED ORDER — POTASSIUM CHLORIDE 10 MEQ/100ML IV SOLN
10.0000 meq | INTRAVENOUS | Status: AC
  Administered 2013-05-25 (×4): 10 meq via INTRAVENOUS
  Filled 2013-05-24 (×4): qty 100

## 2013-05-24 MED ORDER — ASPIRIN 81 MG PO CHEW
324.0000 mg | CHEWABLE_TABLET | Freq: Once | ORAL | Status: AC
  Administered 2013-05-24: 324 mg via ORAL
  Filled 2013-05-24: qty 4

## 2013-05-24 MED ORDER — SODIUM CHLORIDE 0.9 % IV BOLUS (SEPSIS)
1000.0000 mL | Freq: Once | INTRAVENOUS | Status: AC
  Administered 2013-05-24: 1000 mL via INTRAVENOUS

## 2013-05-24 MED ORDER — SODIUM CHLORIDE 0.9 % IV SOLN
INTRAVENOUS | Status: DC
  Administered 2013-05-24: 21:00:00 via INTRAVENOUS

## 2013-05-24 MED ORDER — DEXTROSE-NACL 5-0.45 % IV SOLN: INTRAVENOUS | Status: DC

## 2013-05-24 MED ORDER — SODIUM CHLORIDE 0.9 % IV SOLN: INTRAVENOUS | Status: DC

## 2013-05-24 MED ORDER — METHYLPREDNISOLONE SODIUM SUCC 125 MG IJ SOLR
60.0000 mg | Freq: Two times a day (BID) | INTRAMUSCULAR | Status: DC
  Administered 2013-05-24 – 2013-05-25 (×2): 60 mg via INTRAVENOUS
  Filled 2013-05-24 (×3): qty 0.96

## 2013-05-24 MED ORDER — DEXTROSE 50 % IV SOLN: 25.0000 mL | INTRAVENOUS | Status: DC | PRN

## 2013-05-24 MED ORDER — DEXTROSE 5 % IV SOLN
1.0000 g | INTRAVENOUS | Status: DC
  Administered 2013-05-25: 1 g via INTRAVENOUS
  Filled 2013-05-24 (×3): qty 10

## 2013-05-24 MED ORDER — SODIUM CHLORIDE 0.9 % IV SOLN
INTRAVENOUS | Status: DC
  Administered 2013-05-24: 4.9 [IU]/h via INTRAVENOUS
  Filled 2013-05-24: qty 1

## 2013-05-24 MED ORDER — IPRATROPIUM-ALBUTEROL 0.5-2.5 (3) MG/3ML IN SOLN
3.0000 mL | RESPIRATORY_TRACT | Status: DC
  Administered 2013-05-24 – 2013-05-26 (×7): 3 mL via RESPIRATORY_TRACT
  Filled 2013-05-24 (×50): qty 3

## 2013-05-24 MED ORDER — ONDANSETRON HCL 4 MG/2ML IJ SOLN: 4.0000 mg | Freq: Three times a day (TID) | INTRAMUSCULAR | Status: AC | PRN

## 2013-05-24 MED ORDER — PREGABALIN 50 MG PO CAPS
50.0000 mg | ORAL_CAPSULE | Freq: Three times a day (TID) | ORAL | Status: DC
  Administered 2013-05-25 – 2013-05-29 (×12): 50 mg via ORAL
  Filled 2013-05-24 (×12): qty 1

## 2013-05-24 MED ORDER — PANTOPRAZOLE SODIUM 40 MG IV SOLR
40.0000 mg | Freq: Two times a day (BID) | INTRAVENOUS | Status: DC
  Administered 2013-05-24 – 2013-05-25 (×2): 40 mg via INTRAVENOUS
  Filled 2013-05-24 (×3): qty 40

## 2013-05-24 MED ORDER — INSULIN REGULAR BOLUS VIA INFUSION
0.0000 [IU] | Freq: Three times a day (TID) | INTRAVENOUS | Status: DC
  Filled 2013-05-24: qty 10

## 2013-05-24 MED ORDER — DEXTROSE-NACL 5-0.45 % IV SOLN
INTRAVENOUS | Status: DC
  Administered 2013-05-25: 03:00:00 via INTRAVENOUS

## 2013-05-24 MED ORDER — HEPARIN SODIUM (PORCINE) 5000 UNIT/ML IJ SOLN
5000.0000 [IU] | Freq: Three times a day (TID) | INTRAMUSCULAR | Status: DC
  Administered 2013-05-24 – 2013-05-29 (×14): 5000 [IU] via SUBCUTANEOUS
  Filled 2013-05-24 (×17): qty 1

## 2013-05-24 NOTE — ED Notes (Signed)
Pt alert and interactive, Foley catheter is contraindicated at this time.

## 2013-05-24 NOTE — ED Provider Notes (Signed)
CSN: [id number]     Arrival date & time 05/24/13  1658 History   First MD Initiated Contact with Patient 05/24/13 1717     Chief Complaint  Patient presents with  . Altered Mental Status  . Cough  . Hyperglycemia   (Consider location/radiation/quality/duration/timing/severity/associated sxs/prior Treatment) Patient is a 79 y.o. female presenting with altered mental status, cough, and hyperglycemia. The history is provided by the patient and medical records.  Altered Mental Status Cough Hyperglycemia Associated symptoms: altered mental status    This is a 79 year old female with history significant for CAD, CHF, diabetes, prior she broke, seizures, presenting to the ED for altered metal status and lethargy. Patient's son present at bedside states she has not gotten out of bed and 3 days which is abnormal for her. States at baseline she is usually very active and talks frequently throughout the day.  He notes decreased by PO over the past 3 days. She has also had a nonproductive cough and subjective fever at home.  Son notes she has continued taking all prescribed medications as directed as he administers her medications.  Pt with mild tachycardia on arrival, VS otherwise stable.  Past Medical History  Diagnosis Date  . GERD (gastroesophageal reflux disease)   . Hypertension   . Diverticulitis   . Gout   . Essential and other specified forms of tremor   . Dementia   . CAD (coronary artery disease)   . Anemia, chronic disease   . Left arm pain   . CHF (congestive heart failure)     Diastolic dysfunction  . Pneumonia     child  . Stroke     16  . Acute renal failure (ARF)     denies  . Seizures     r/t stroke no rx  . Arthritis   . Diabetes mellitus     no rx for 6 months   Past Surgical History  Procedure Laterality Date  . Dilation and curettage of uterus    . Tumor removal      abdomen  . Abdominal hysterectomy    . Cataract extraction w/phaco Left 03/30/2013   Procedure: CATARACT EXTRACTION PHACO AND INTRAOCULAR LENS PLACEMENT (IOC);  Surgeon: Shade Flood, MD;  Location: [facility] OR;  Service: Ophthalmology;  Laterality: Left;   Family History  Problem Relation Age of Onset  . Colon cancer Neg Hx   . Heart disease Father   . Heart disease Brother    History  Substance Use Topics  . Smoking status: Former Smoker -- 0.50 packs/day for 10 years    Types: Cigarettes    Quit date: 03/24/1980  . Smokeless tobacco: Never Used  . Alcohol Use: No   OB History   Grav Para Term Preterm Abortions TAB SAB Ect Mult Living                 Review of Systems  Unable to perform ROS: Mental status change  Respiratory: Positive for cough.     Allergies  Ace inhibitors; Codeine; Fentanyl; Morphine; and Penicillins  Home Medications   Current Outpatient Rx  Name  Route  Sig  Dispense  Refill  . albuterol (PROVENTIL HFA;VENTOLIN HFA) 108 (90 BASE) MCG/ACT inhaler   Inhalation   Inhale 2 puffs into the lungs every 6 (six) hours as needed for wheezing.         Marland Kitchen aspirin EC 81 MG tablet   Oral   Take 81 mg by mouth daily.         Marland Kitchen  bimatoprost (LUMIGAN) 0.01 % SOLN   Ophthalmic   Apply 1 drop to eye 2 (two) times daily.         . diphenhydrAMINE (BENADRYL) 25 mg capsule   Oral   Take 25 mg by mouth at bedtime as needed for allergies or sleep.         Marland Kitchen donepezil (ARICEPT) 10 MG tablet   Oral   Take 10 mg by mouth at bedtime.         Marland Kitchen esomeprazole (NEXIUM) 40 MG capsule   Oral   Take 40 mg by mouth 2 (two) times daily.         Marland Kitchen HYDROcodone-acetaminophen (NORCO/VICODIN) 5-325 MG per tablet   Oral   Take 1 tablet by mouth every 6 (six) hours as needed for pain.         . isosorbide-hydrALAZINE (BIDIL) 20-37.5 MG per tablet   Oral   Take 1 tablet by mouth 2 (two) times daily.         . nitroGLYCERIN (NITROSTAT) 0.4 MG SL tablet   Sublingual   Place 0.4 mg under the tongue every 5 (five) minutes as needed for chest pain.          Bertram Gala Glycol-Propyl Glycol (SYSTANE ULTRA OP)   Both Eyes   Place 1 drop into both eyes daily as needed. For dry eyes         . pregabalin (LYRICA) 50 MG capsule   Oral   Take 50 mg by mouth 3 (three) times daily.         Marland Kitchen zolpidem (AMBIEN) 10 MG tablet   Oral   Take 10 mg by mouth at bedtime.           BP 141/80  Pulse 111  Temp(Src) 98.2 F (36.8 C) (Oral)  Resp 15  Wt 190 lb (86.183 kg)  SpO2 95%  Physical Exam  Nursing note and vitals reviewed. Constitutional: She appears well-developed and well-nourished. She appears lethargic. No distress.  Clinically appears dry, smells of ketones  HENT:  Head: Normocephalic and atraumatic.  Mouth/Throat: Oropharynx is clear and moist.  Eyes: Conjunctivae and EOM are normal. Pupils are equal, round, and reactive to light.  Neck: Normal range of motion. Neck supple.  Cardiovascular: Normal rate, regular rhythm and normal heart sounds.   Pulmonary/Chest: Effort normal and breath sounds normal. No respiratory distress. She has no wheezes.  Abdominal: Soft. Bowel sounds are normal. There is no tenderness. There is no guarding.  Musculoskeletal: Normal range of motion.  Neurological: She appears lethargic. She displays tremor.  Pt lethargic, following limited commands; baseline mild tremor of bilateral hands; equal strength UE bilaterally; will move lower extremities spontaneously but will not follow specific commands to push/pull feet or lift leg off bed  Skin: Skin is warm and dry. She is not diaphoretic.  Psychiatric: She has a normal mood and affect.    ED Course  Procedures (including critical care time) Labs Review Labs Reviewed  GLUCOSE, CAPILLARY - Abnormal; Notable for the following:    Glucose-Capillary >600 (*)    All other components within normal limits  CBC WITH DIFFERENTIAL - Abnormal; Notable for the following:    Hemoglobin 9.3 (*)    HCT 31.1 (*)    MCV 73.5 (*)    MCH 22.0 (*)    MCHC 29.9  (*)    RDW 21.6 (*)    Neutrophils Relative % 83 (*)    Lymphocytes Relative 11 (*)  All other components within normal limits  BASIC METABOLIC PANEL - Abnormal; Notable for the following:    Potassium 3.3 (*)    Glucose, Bld 670 (*)    BUN 46 (*)    Creatinine, Ser 2.13 (*)    GFR calc non Af Amer 21 (*)    GFR calc Af Amer 25 (*)    All other components within normal limits  PRO B NATRIURETIC PEPTIDE - Abnormal; Notable for the following:    Pro B Natriuretic peptide (BNP) 2226.0 (*)    All other components within normal limits  GLUCOSE, CAPILLARY - Abnormal; Notable for the following:    Glucose-Capillary >600 (*)    All other components within normal limits  TROPONIN I  URINALYSIS, ROUTINE W REFLEX MICROSCOPIC  POCT I-STAT TROPONIN I   Imaging Review No results found.  EKG Interpretation    Date/Time:  Tuesday May 24 2013 17:51:45 EST Ventricular Rate:  105 PR Interval:  161 QRS Duration: 155 QT Interval:  404 QTC Calculation: 534 R Axis:   -26 Text Interpretation:  Sinus tachycardia Left bundle branch block LBBB is new in comparison to previous EKGs, most recently 01/2013 Confirmed by KOHUT  MD, STEPHEN ([id number]) on 05/24/2013 6:53:39 PM            MDM   1. DKA (diabetic ketoacidoses)   2. Left bundle branch block   3. Altered mental status   4. CAD (coronary artery disease)   5. HTN (hypertension)   6. DM (diabetes mellitus)    On arrival, pt lethargic and following limited commands when prompted.  She smells of ketones, blood sugar >600.  Pt likely with DKA as the source for her AMS.  Will obtain basic labs, CXR.  EKG sinus tach with left bundle.  Trop negative.  Labs with anion gap of 20, glucose 670.  Bump in Cr to 2.13 from 1.08 2 months ago.  BNP elevated at ~2200.  CXR with pulmonary edema.  Pt started on glucose stabilizer.  Mental status beginning to improve, pt now much more talkative and responsive but will require hospital admission.   Consulted hospitalist, Dr. Allena Katz-- will admit to telemetry.  Consulted cardiology regarding left bundle-- spoke with Dr. Terressa Koyanagi, who has reviewed prior records and found that pt has hx of prior LBBB, no intervention needed at this time.  Temp admission orders placed.  VS stable at this time.  Garlon Hatchet, PA-C 05/24/13 2040  Garlon Hatchet, PA-C 05/24/13 2045

## 2013-05-24 NOTE — ED Notes (Signed)
POCT CBG resulted HIGH; Shanda Bumps, RN notified

## 2013-05-24 NOTE — Progress Notes (Signed)
ANTIBIOTIC CONSULT NOTE - INITIAL  Pharmacy Consult for ceftriaxone Indication: UTI and possible COPD exacerbation  Allergies  Allergen Reactions  . Ace Inhibitors Shortness Of Breath and Other (See Comments)    Angioedema   . Codeine Other (See Comments)    hallucination  . Fentanyl Other (See Comments)    Abnormal behavior per husband  . Other Other (See Comments)    Beef and turkey-causes gout flareup  . Morphine Itching  . Penicillins Itching    Patient Measurements: Weight: 190 lb (86.183 kg) Adjusted Body Weight:  Vital Signs: Temp: 98.2 F (36.8 C) (12/30 1708) Temp src: Oral (12/30 1708) BP: 109/83 mmHg (12/30 2137) Pulse Rate: 95 (12/30 2137) Intake/Output from previous day:   Intake/Output from this shift:    Labs:  Recent Labs  05/24/13 1850  WBC 9.0  HGB 9.3*  PLT 240  CREATININE 2.13*   The CrCl is unknown because both a height and weight (above a minimum accepted value) are required for this calculation. No results found for this basename: VANCOTROUGH, VANCOPEAK, VANCORANDOM, GENTTROUGH, GENTPEAK, GENTRANDOM, TOBRATROUGH, TOBRAPEAK, TOBRARND, AMIKACINPEAK, AMIKACINTROU, AMIKACIN,  in the last 72 hours   Microbiology: No results found for this or any previous visit (from the past 720 hour(s)).  Medical History: Past Medical History  Diagnosis Date  . GERD (gastroesophageal reflux disease)   . Hypertension   . Diverticulitis   . Gout   . Essential and other specified forms of tremor   . Dementia   . CAD (coronary artery disease)   . Anemia, chronic disease   . Left arm pain   . CHF (congestive heart failure)     Diastolic dysfunction  . Pneumonia     child  . Stroke     16  . Acute renal failure (ARF)     denies  . Seizures     r/t stroke no rx  . Arthritis   . Diabetes mellitus     no rx for 6 months    Medications:   Assessment: 79 yr old female found unresponsive by her daughter. Has not been feeling well or eating since  Christmas. She has had a cough, diarrhea and CBG of 462. She has not been taking her oral hypoglycemic medication. Pharmacy has been asked to dose Rocephin for UTI and possible COPD exacerbation.  Goal of Therapy:  Resolution of infection  Plan:  Rocephin 1 Gm IV qday.  Glenda Gonzalez 05/24/2013,9:55 PM

## 2013-05-24 NOTE — ED Notes (Addendum)
CBG 546; Dr. Allena Katz at bedside and made aware

## 2013-05-24 NOTE — ED Notes (Signed)
Pt alert and interactive at this time. Family took pt's belongings home with the,.

## 2013-05-24 NOTE — H&P (Signed)
[facility] History and Physical  Patient: Glenda Gonzalez  ZOX:[id number]  DOB: 05-Sep-1933  DOS: the patient was seen and examined on 05/24/2013 PCP: Billee Cashing, MD  Chief Complaint: AMS  HPI: Glenda Gonzalez is a 79 y.o. female with Past medical history of diabetes, hypertension, coronary artery disease 60-70% diagonal block, history of CHF, recent CVA . The patient is coming from home. The patient was brought in by her family member as she was not responding appropriately and was talking incoherently. As with the family since Christmas she has been having generalized weakness and lethargy and has been more tired. She has not been eating good. She has episodes of diarrhea since last to 3 days with one of the bowel movements being dark stools. She also has been urinating more than her usual. Since Christmas she started with a cough but does not bring up any phlegm. Her husband was giving her Robitussin DM. There was no fever or chills noted. There was no trauma or injury. There was no incontinence of bowel or bladder or seizure-like movements. As per the family she is not taking her oral hypoglycemic medication. Patient does not remember her medication neither the family. She's not prescribed insulin. She is not complaining of chest pain.  Review of Systems: as mentioned in the history of present illness.  A Comprehensive review of the other systems is negative.  Past Medical History  Diagnosis Date  . GERD (gastroesophageal reflux disease)   . Hypertension   . Diverticulitis   . Gout   . Essential and other specified forms of tremor   . Dementia   . CAD (coronary artery disease)   . Anemia, chronic disease   . Left arm pain   . CHF (congestive heart failure)     Diastolic dysfunction  . Pneumonia     child  . Stroke     16  . Acute renal failure (ARF)     denies  . Seizures     r/t stroke no rx  . Arthritis   . Diabetes mellitus     no rx for 6 months    Past Surgical History  Procedure Laterality Date  . Dilation and curettage of uterus    . Tumor removal      abdomen  . Abdominal hysterectomy    . Cataract extraction w/phaco Left 03/30/2013    Procedure: CATARACT EXTRACTION PHACO AND INTRAOCULAR LENS PLACEMENT (IOC);  Surgeon: Shade Flood, MD;  Location: [facility] OR;  Service: Ophthalmology;  Laterality: Left;   Social History:  reports that she quit smoking about 33 years ago. Her smoking use included Cigarettes. She has a 5 pack-year smoking history. She has never used smokeless tobacco. She reports that she does not drink alcohol or use illicit drugs. Independent for most of her  ADL.  Allergies  Allergen Reactions  . Ace Inhibitors Shortness Of Breath and Other (See Comments)    Angioedema   . Codeine Other (See Comments)    hallucination  . Fentanyl Other (See Comments)    Abnormal behavior per husband  . Other Other (See Comments)    Beef and turkey-causes gout flareup  . Morphine Itching  . Penicillins Itching    Family History  Problem Relation Age of Onset  . Colon cancer Neg Hx   . Heart disease Father   . Heart disease Brother     Prior to Admission medications   Medication Sig Start Date End Date Taking? Authorizing Provider  bimatoprost (LUMIGAN) 0.01 % SOLN Apply 1 drop to eye 2 (two) times daily.   Yes Historical Provider, MD  diphenhydrAMINE (BENADRYL) 25 mg capsule Take 25 mg by mouth at bedtime as needed for itching or sleep.    Yes Historical Provider, MD  donepezil (ARICEPT) 10 MG tablet Take 10 mg by mouth at bedtime.   Yes Historical Provider, MD  esomeprazole (NEXIUM) 40 MG capsule Take 40 mg by mouth 2 (two) times daily.   Yes Historical Provider, MD  furosemide (LASIX) 20 MG tablet Take 20 mg by mouth daily.   Yes Historical Provider, MD  guaiFENesin-dextromethorphan (ROBITUSSIN DM) 100-10 MG/5ML syrup Take 5 mLs by mouth every 4 (four) hours as needed for cough.   Yes Historical Provider, MD   isosorbide-hydrALAZINE (BIDIL) 20-37.5 MG per tablet Take 1 tablet by mouth 2 (two) times daily.   Yes Historical Provider, MD  ketorolac (ACULAR) 0.4 % SOLN Place 1 drop into both eyes 4 (four) times daily.   Yes Historical Provider, MD  nitroGLYCERIN (NITROSTAT) 0.4 MG SL tablet Place 0.4 mg under the tongue every 5 (five) minutes as needed for chest pain.   Yes Historical Provider, MD  Polyethyl Glycol-Propyl Glycol (SYSTANE ULTRA OP) Place 1 drop into both eyes daily as needed. For dry eyes   Yes Historical Provider, MD  prednisoLONE acetate (PRED FORTE) 1 % ophthalmic suspension Place 1 drop into both eyes 4 (four) times daily.   Yes Historical Provider, MD  pregabalin (LYRICA) 50 MG capsule Take 50 mg by mouth 3 (three) times daily.   Yes Historical Provider, MD  zolpidem (AMBIEN) 10 MG tablet Take 10 mg by mouth at bedtime as needed for sleep.    Yes Historical Provider, MD    Physical Exam: Filed Vitals:   05/24/13 1708 05/24/13 1810 05/24/13 1815  BP: 141/80 125/61 126/65  Pulse: 111 106 103  Temp: 98.2 F (36.8 C)    TempSrc: Oral    Resp: 15 25 23   Weight: 86.183 kg (190 lb)    SpO2: 95% 94% 97%    General: Alert, Awake and Oriented to Time, Place and Person. Appear in marked distress Eyes: PERRL ENT: Oral Mucosa clear dry. Neck: no JVD Cardiovascular: S1 and S2 Present, no Murmur, Peripheral Pulses Present Respiratory: Bilateral Air entry equal and Decreased, basal Crackles,extensive wheezes Abdomen: Bowel Sound Present, Soft and Non tender Skin: no Rash Extremities: no Pedal edema, no calf tenderness Neurologic: Grossly Unremarkable.  Labs on Admission:  CBC:  Recent Labs Lab 05/24/13 1850  WBC 9.0  NEUTROABS 7.5  HGB 9.3*  HCT 31.1*  MCV 73.5*  PLT 240    CMP     Component Value Date/Time   NA 140 05/24/2013 1850   K 3.3* 05/24/2013 1850   CL 98 05/24/2013 1850   CO2 22 05/24/2013 1850   GLUCOSE 670* 05/24/2013 1850   BUN 46* 05/24/2013 1850    CREATININE 2.13* 05/24/2013 1850   CALCIUM 8.6 05/24/2013 1850   PROT 7.3 10/05/2012 0911   ALBUMIN 3.0* 10/05/2012 0911   AST 21 10/05/2012 0911   ALT 12 10/05/2012 0911   ALKPHOS 177* 10/05/2012 0911   BILITOT 0.2* 10/05/2012 0911   GFRNONAA 21* 05/24/2013 1850   GFRAA 25* 05/24/2013 1850    No results found for this basename: LIPASE, AMYLASE,  in the last 168 hours No results found for this basename: AMMONIA,  in the last 168 hours   Recent Labs Lab 05/24/13 1850  TROPONINI <0.30  BNP (last 3 results)  Recent Labs  07/22/12 0410 09/19/12 2052 05/24/13 1850  PROBNP 979.7* 257.8 2226.0*    Radiological Exams on Admission: Dg Chest Portable 1 View  05/24/2013   CLINICAL DATA:  Altered mental status  EXAM: PORTABLE CHEST - 1 VIEW  COMPARISON:  Chest radiograph 03/24/2013  FINDINGS: Left central venous line with tip in the mid SVC and junction of the brachiocephalic vein. Stable cardiac silhouette. The right contour of the mediastinum is prominent which is felt to relate to patient rotation. Patient is rotated rightward. There is interval increase in bilateral nodular airspace disease. No pneumothorax.  IMPRESSION: 1. Left central venous line with tip at the junction of the brachiocephalic vein and SVC. No pneumothorax . 2. Exaggeration of the right mediastinal contour is felt to relate to patient rotation. 3. Bilateral nodular airspace disease suggests pulmonary edema. Cannot exclude pulmonary infection.   Electronically Signed   By: Genevive Bi M.D.   On: 05/24/2013 19:06    EKG: Independently reviewed. LBBB.  Assessment/Plan Principal Problem:   DKA (diabetic ketoacidoses) Active Problems:   HYPERTENSION   Diarrhea   LBBB (left bundle branch block)   Cough   CHF (congestive heart failure)   1. DKA (diabetic ketoacidoses)  the patient is coming in with elevated blood sugar, Ultram and status, anion gap of 20 with ketones in her urine. All these suggest she is in a  DKA. She has been started on insulin drip she was also given potassium replacement aggressively. She has been given IV fluid bolus in the ED considering possibility of CHF I would be gentle on hydration. Chart he has a central venous line we will draw a BMP every 4 hours. She'll be admitted to telemetry unit at present. Her change in mental status is likely secondary to DKA and dehydration which is significantly improved since her arrival in the ED. The possible cause of DKA could be bronchitis or UTI. There is also a possibility of possible pneumonic ischemia due to new onset left bundle-branch block. Although her troponins are negative and she never complained of chest pain. We will be consulting cardiology, give her aspirin, get an echocardiogram.  2.Cough Patient has been complaining of cough with sputum expectoration without any fever. She has basal crackles and extensive wheezing on examination. Although this could be cardiac asthma with a history of smoking in the past it could also be possible obstructive lung disease exacerbation. For which I will give her IV Solu-Medrol duo nebs and IV antibiotics.  3. Possible CHF At present the etiology is unclear I will get an echocardiogram in the morning for further evaluation. She has new onset left bundle branch block and any rigidity proBNP with a negative troponin. Cardiology was consulted and will follow the recommendations. Gentle hydrations at present in the presence of DKA and CHF  4. Diarrhea Likely secondary to viral symptoms but since the family has complained about possibility of black color stool I will obtain an hemoocult. Given IV Protonix and keep her n.p.o.  5. Central line insertion The chest x-ray shows a central line is in place. Once her DKA was resolved may get a central line removed.  Consults:  cardiology  DVT Prophylaxis: subcutaneous Heparin Nutrition:  n.p.o.  Code Status:  full  Family Communication:  family  was present at bedside, opportunity was given to ask question and all questions were answered satisfactorily at the time of interview. Disposition: Admitted to inpatient in telemetry unit.  Author:  Lynden Oxford, MD [facility] Pager: [phone] 05/24/2013, 9:05 PM    If 7PM-7AM, please contact night-coverage [email or web address] Password [email or web address]

## 2013-05-24 NOTE — ED Notes (Signed)
Attempted report 

## 2013-05-24 NOTE — ED Provider Notes (Signed)
Medical screening examination/treatment/procedure(s) were conducted as a shared visit with non-physician practitioner(s) and myself.  I personally evaluated the patient during the encounter.  EKG Interpretation    Date/Time:  Tuesday May 24 2013 17:51:45 EST Ventricular Rate:  105 PR Interval:  161 QRS Duration: 155 QT Interval:  404 QTC Calculation: 534 R Axis:   -26 Text Interpretation:  Sinus tachycardia Left bundle branch block LBBB is new in comparison to previous EKGs, most recently 01/2013 Confirmed by Juleen China  MD, Alexie Lanni ([id number]) on 05/24/2013 6:53:39 PM           79yF with AMS. Has not been getting out of bed and not talkative which is highly unlike her. Has been coughing. Poor PO intake. No specific complaints to her son. No reported trauma. No recorded fever, but has felt warm. Clinically DKA.  Possible infectious precipitant w/ harsh cough for past 1-2 day. Consider ischemia with new LBBB on EKG. Unfortunately cannot obtain a reliable ROS with pt's current mental status.  Mild tachycardia. Appears sinus with regular wide complex on monitor. BP ok. Dry mucus membranes. Eyes open but not answers questions. Moaning occasionally. Abdomen soft. Lower abdominal tenderness? No concerning skin lesions noted.   Central Line placed. Fluid resuscitation. Labs pending. ASA. Son at bedside. Pt is Full Code. WIll need admit.  CENTRAL LINE Performed by: Raeford Razor Consent: The procedure was performed in an emergent situation. Required items: required blood products, implants, devices, and special equipment available Patient identity confirmed: arm band and provided demographic data Time out: Immediately prior to procedure a "time out" was called to verify the correct patient, procedure, equipment, support staff and site/side marked as required. Indications: vascular access Anesthesia: local infiltration Local anesthetic: lidocaine 1% w/o epinephrine Anesthetic total: 3  ml Patient sedated: no Preparation: skin prepped with 2% chlorhexidine Skin prep agent dried: skin prep agent completely dried prior to procedure Sterile barriers: all five maximum sterile barriers used - cap, mask, sterile gown, sterile gloves, and large sterile sheet Hand hygiene: hand hygiene performed prior to central venous catheter insertion  Location details: L IJ  Catheter type: triple lumen Catheter size: 8 Fr Pre-procedure: landmarks identified Ultrasound guidance: yes Successful placement: yes Post-procedure: line sutured and dressing applied Assessment: blood return except white port. All flushed easily.  placement verified by x-ray and no pneumothorax on x-ray Patient tolerance: Patient tolerated the procedure well with no immediate complications.   CRITICAL CARE Performed by: Raeford Razor  Total critical care time: 35 minutes  Critical care time was exclusive of separately billable procedures and treating other patients. Critical care was necessary to treat or prevent imminent or life-threatening deterioration. Critical care was time spent personally by me on the following activities: development of treatment plan with patient and/or surrogate as well as nursing, discussions with consultants, evaluation of patient's response to treatment, examination of patient, obtaining history from patient or surrogate, ordering and performing treatments and interventions, ordering and review of laboratory studies, ordering and review of radiographic studies, pulse oximetry and re-evaluation of patient's condition.   Raeford Razor, MD 05/26/13 [phone]

## 2013-05-24 NOTE — ED Notes (Signed)
Pt here with family c/o AMS and lethargy; pt with cough and not feeling with; poor PO intake

## 2013-05-24 NOTE — ED Notes (Signed)
Pt placed on 2L [place] per Dr. Juleen China

## 2013-05-24 NOTE — ED Notes (Signed)
Pt arousable to verbal stimulation but only speaks 1-2 words at a time. Family reports pt has been incontinent x5 today.

## 2013-05-25 DIAGNOSIS — E1110 Type 2 diabetes mellitus with ketoacidosis without coma: Secondary | ICD-10-CM

## 2013-05-25 DIAGNOSIS — J101 Influenza due to other identified influenza virus with other respiratory manifestations: Secondary | ICD-10-CM | POA: Diagnosis present

## 2013-05-25 DIAGNOSIS — A0472 Enterocolitis due to Clostridium difficile, not specified as recurrent: Secondary | ICD-10-CM

## 2013-05-25 DIAGNOSIS — J9600 Acute respiratory failure, unspecified whether with hypoxia or hypercapnia: Secondary | ICD-10-CM

## 2013-05-25 DIAGNOSIS — I517 Cardiomegaly: Secondary | ICD-10-CM

## 2013-05-25 DIAGNOSIS — N179 Acute kidney failure, unspecified: Secondary | ICD-10-CM

## 2013-05-25 DIAGNOSIS — I509 Heart failure, unspecified: Secondary | ICD-10-CM

## 2013-05-25 DIAGNOSIS — R4182 Altered mental status, unspecified: Secondary | ICD-10-CM

## 2013-05-25 DIAGNOSIS — I2510 Atherosclerotic heart disease of native coronary artery without angina pectoris: Secondary | ICD-10-CM

## 2013-05-25 LAB — BASIC METABOLIC PANEL
BUN: 43 mg/dL — ABNORMAL HIGH (ref 6–23)
BUN: 44 mg/dL — ABNORMAL HIGH (ref 6–23)
CO2: 20 mEq/L (ref 19–32)
CO2: 21 mEq/L (ref 19–32)
CO2: 20 mEq/L (ref 19–32)
Calcium: 8.1 mg/dL — ABNORMAL LOW (ref 8.4–10.5)
Chloride: 109 mEq/L (ref 96–112)
Chloride: 112 mEq/L (ref 96–112)
Chloride: 110 mEq/L (ref 96–112)
GFR calc Af Amer: 26 mL/min — ABNORMAL LOW (ref >90)
GFR calc Af Amer: 27 mL/min — ABNORMAL LOW (ref >90)
GFR calc non Af Amer: 22 mL/min — ABNORMAL LOW (ref >90)
Glucose, Bld: 196 mg/dL — ABNORMAL HIGH (ref 70–99)
Glucose, Bld: 255 mg/dL — ABNORMAL HIGH (ref 70–99)
Potassium: 2.9 mEq/L — CL (ref 3.7–5.3)
Potassium: 3.7 mEq/L (ref 3.7–5.3)
Potassium: 3.8 mEq/L (ref 3.7–5.3)
Sodium: 148 mEq/L — ABNORMAL HIGH (ref 137–147)
Sodium: 150 mEq/L — ABNORMAL HIGH (ref 137–147)
Sodium: 146 mEq/L (ref 137–147)

## 2013-05-25 LAB — GLUCOSE, CAPILLARY
Glucose-Capillary: 214 mg/dL — ABNORMAL HIGH (ref 70–99)
Glucose-Capillary: 201 mg/dL — ABNORMAL HIGH (ref 70–99)
Glucose-Capillary: 254 mg/dL — ABNORMAL HIGH (ref 70–99)
Glucose-Capillary: 251 mg/dL — ABNORMAL HIGH (ref 70–99)
Glucose-Capillary: 194 mg/dL — ABNORMAL HIGH (ref 70–99)
Glucose-Capillary: 208 mg/dL — ABNORMAL HIGH (ref 70–99)
Glucose-Capillary: 279 mg/dL — ABNORMAL HIGH (ref 70–99)
Glucose-Capillary: 357 mg/dL — ABNORMAL HIGH (ref 70–99)

## 2013-05-25 LAB — INFLUENZA PANEL BY PCR (TYPE A & B)
Influenza A By PCR: POSITIVE — AB
Influenza B By PCR: NEGATIVE

## 2013-05-25 LAB — FERRITIN: Ferritin: 32 ng/mL (ref 10–291)

## 2013-05-25 LAB — IRON AND TIBC
Iron: 11 ug/dL — ABNORMAL LOW (ref 42–135)
TIBC: 339 ug/dL (ref 250–470)

## 2013-05-25 LAB — TROPONIN I: Troponin I: <0.3 ng/mL (ref <0.30)

## 2013-05-25 MED ORDER — SODIUM CHLORIDE 0.9 % IJ SOLN
10.0000 mL | INTRAMUSCULAR | Status: DC | PRN
  Administered 2013-05-25: 20 mL
  Administered 2013-05-27: 30 mL
  Administered 2013-05-29 (×2): 10 mL

## 2013-05-25 MED ORDER — INSULIN GLARGINE 100 UNIT/ML ~~LOC~~ SOLN
20.0000 [IU] | Freq: Every day | SUBCUTANEOUS | Status: DC
  Administered 2013-05-25: 20 [IU] via SUBCUTANEOUS
  Filled 2013-05-25 (×2): qty 0.2

## 2013-05-25 MED ORDER — INSULIN ASPART 100 UNIT/ML ~~LOC~~ SOLN
0.0000 [IU] | Freq: Three times a day (TID) | SUBCUTANEOUS | Status: DC
  Administered 2013-05-25: 15 [IU] via SUBCUTANEOUS
  Administered 2013-05-25 – 2013-05-26 (×3): 8 [IU] via SUBCUTANEOUS
  Administered 2013-05-27 (×2): 3 [IU] via SUBCUTANEOUS
  Administered 2013-05-28: 5 [IU] via SUBCUTANEOUS
  Administered 2013-05-29: 2 [IU] via SUBCUTANEOUS

## 2013-05-25 MED ORDER — SODIUM CHLORIDE 0.45 % IV SOLN
INTRAVENOUS | Status: DC
  Administered 2013-05-25: 05:00:00 via INTRAVENOUS

## 2013-05-25 MED ORDER — POTASSIUM CHLORIDE 10 MEQ/50ML IV SOLN
10.0000 meq | INTRAVENOUS | Status: AC
  Administered 2013-05-25 (×2): 10 meq via INTRAVENOUS
  Filled 2013-05-25 (×3): qty 50

## 2013-05-25 MED ORDER — ONDANSETRON HCL 4 MG/2ML IJ SOLN
4.0000 mg | Freq: Four times a day (QID) | INTRAMUSCULAR | Status: DC | PRN
  Administered 2013-05-25: 4 mg via INTRAVENOUS
  Filled 2013-05-25: qty 2

## 2013-05-25 MED ORDER — SODIUM CHLORIDE 0.9 % IV SOLN
INTRAVENOUS | Status: DC
  Administered 2013-05-25 – 2013-05-26 (×3): via INTRAVENOUS

## 2013-05-25 MED ORDER — DEXTROSE 5 % IV SOLN
1.0000 g | INTRAVENOUS | Status: DC
  Administered 2013-05-25 – 2013-05-28 (×4): 1 g via INTRAVENOUS
  Filled 2013-05-25 (×5): qty 10

## 2013-05-25 MED ORDER — DEXTROSE 5 % IV SOLN
INTRAVENOUS | Status: DC
  Administered 2013-05-25: 06:00:00 via INTRAVENOUS

## 2013-05-25 MED ORDER — OSELTAMIVIR PHOSPHATE 30 MG PO CAPS
30.0000 mg | ORAL_CAPSULE | Freq: Two times a day (BID) | ORAL | Status: DC
  Administered 2013-05-25 – 2013-05-26 (×3): 30 mg via ORAL
  Filled 2013-05-25 (×4): qty 1

## 2013-05-25 MED ORDER — POTASSIUM CHLORIDE CRYS ER 20 MEQ PO TBCR
40.0000 meq | EXTENDED_RELEASE_TABLET | Freq: Once | ORAL | Status: AC
  Administered 2013-05-25: 40 meq via ORAL
  Filled 2013-05-25: qty 2

## 2013-05-25 MED ORDER — PANTOPRAZOLE SODIUM 40 MG PO TBEC
40.0000 mg | DELAYED_RELEASE_TABLET | Freq: Two times a day (BID) | ORAL | Status: DC
  Administered 2013-05-25 – 2013-05-29 (×8): 40 mg via ORAL
  Filled 2013-05-25 (×7): qty 1

## 2013-05-25 NOTE — Progress Notes (Signed)
[facility] PROGRESS NOTE  Glenda Gonzalez  ZOX:[id number]  DOB: 03/26/1934  DOS: 05/25/2013  PCP: Billee Cashing, MD  Assessment and Plan: Patient is BMP has been monitored. In response to persistent 2 repeat BMP with sodium of 148 her fluids were changed to D5 half normal. Despite which her sodium has been elevated to 150 therefore she has been placed on D5 only at 50 cc per hour. Repeat BMP in 2 hours. Patient does not appear to have any new neurological deficit or confusion or seizures. Placing the order for neuro checks every 4 hours Also her anion gap is currently 17, and recheck a BMP in the anion gap remains closed may stop the insulin drip. 4 hypokalemia she has been changed to 10 mEq potassiumIn 50 cc which has improved her potassium level. I will recheck another BMP if the potassium is more than before I would stop replacing it due to her acute kidney injury.  Author: Lynden Oxford, MD [facility] Pager: [phone] 05/25/2013 5:53 AM   If 7PM-7AM, please contact night-coverage at [email or web address], password [facility]

## 2013-05-25 NOTE — Progress Notes (Signed)
CRITICAL VALUE ALERT  Critical value received:  Potassium 2.9  Date of notification:  05/25/2013  Time of notification:  0423  Critical value read back:yes  Nurse who received alert:  Eliane Decree   MD notified (1st page):  Lynden Oxford, MD  Time of first page: [phone]  MD notified (2nd page):  Time of second page:  Responding MD: Lynden Oxford, MD  Time MD responded:  [phone]  New orders to hold Insulin drip& D5 drip until potassium is 3.5 . New orders to start 1/2 NS @ 51ml/hr. New orders for 3 more runs of potassium. MD made aware that the the BMP was drawn before first 4 runs of potassium were started. Will continue to monitor the pt. Sanda Linger, RN

## 2013-05-25 NOTE — Progress Notes (Signed)
Utilization review completed.  

## 2013-05-25 NOTE — Progress Notes (Signed)
Hemoglobin A1c still pending. Previous hemoglobin A1c was 10.8 in February and 8.4 in May. Patient likely will need insulin, I will start her on 20 units of Lantus insulin at night.   Clint Lipps, MD [facility] Pager: [phone] 05/25/2013, 3:10 PM

## 2013-05-25 NOTE — Progress Notes (Signed)
[facility] PROGRESS NOTE  Glenda Gonzalez:[id number] DOB: [birth year] DOA: 05/24/2013 PCP: Billee Cashing, MD  HPI/Subjective: Does not feel very well, complaining about body aches.  Assessment/Plan: Principal Problem:   DKA (diabetic ketoacidoses) Active Problems:   HYPERTENSION   Diarrhea   LBBB (left bundle branch block)   Cough   CHF (congestive heart failure)   Influenza A with respiratory manifestations   Influenza type A with respiratory manifestations -Patient did have some cough and myalgia -Tested positive for type A influenza, started on Tamiflu adjusted to her renal function. -Symptomatic management with bronchodilators and mucolytics, oxygen as needed.  UTI -Her urinalysis consistent with UTI. -Patient started on Rocephin. -I will adjust the antibiotics according to the culture results.  Hyperglycemic hyperosmolar state. -Blood sugar is 670, no evidence of acidosis with bicarbonate of 22, pH is 7.33 from VBG (typically 0.02-0.05 lower than ABG). -Treated with IV insulin therapy, now she is off of glucostabilizer protocol. -She's not on insulin for home, started on insulin sliding scale. -Carbohydrate modified diet, followup with her hemoglobin A1c.  Acute on chronic renal failure -Patient has stage III CKD with baseline creatinine of 1.0 in October of 2014. -She has presented with creatinine of 2.0, hydrate with IV fluids. -This is likely secondary to volume depletion from hyperglycemic state. -Followup closely with renal function and strict intake and output.   Code Status: Full code Family Communication: Plan discussed with the patient. Disposition Plan: Remains inpatient   Consultants:  None  Procedures:  None  Antibiotics:  None   Objective: Filed Vitals:   05/25/13 0400  BP: 131/80  Pulse: 80  Temp: 98.5 F (36.9 C)  Resp: 18    Intake/Output Summary (Last 24 hours) at 05/25/13 1257 Last data filed at 05/25/13  0900  Gross per 24 hour  Intake    300 ml  Output      0 ml  Net    300 ml   Filed Weights   05/24/13 1708 05/24/13 2216 05/25/13 0400  Weight: 86.183 kg (190 lb) 81.829 kg (180 lb 6.4 oz) 81.829 kg (180 lb 6.4 oz)    Exam: General: Alert and awake, oriented x3, not in any acute distress. HEENT: anicteric sclera, pupils reactive to light and accommodation, EOMI CVS: S1-S2 clear, no murmur rubs or gallops Chest: clear to auscultation bilaterally, no wheezing, rales or rhonchi Abdomen: soft nontender, nondistended, normal bowel sounds, no organomegaly Extremities: no cyanosis, clubbing or edema noted bilaterally Neuro: Cranial nerves II-XII intact, no focal neurological deficits  Data Reviewed: Basic Metabolic Panel:  Recent Labs Lab 05/24/13 1850 05/24/13 2224 05/25/13 0155 05/25/13 0440 05/25/13 0635  NA 140 148* 148* 150* 146  K 3.3* 3.1* 2.9* 3.7 3.8  CL 98 109 109 112 110  CO2 22 19 20 21 20   GLUCOSE 670* 353* 258* 196* 255*  BUN 46* 43* 44* 43* 44*  CREATININE 2.13* 1.96* 2.03* 2.00* 1.99*  CALCIUM 8.6 8.0* 8.1* 8.1* 8.0*   Liver Function Tests: No results found for this basename: AST, ALT, ALKPHOS, BILITOT, PROT, ALBUMIN,  in the last 168 hours No results found for this basename: LIPASE, AMYLASE,  in the last 168 hours No results found for this basename: AMMONIA,  in the last 168 hours CBC:  Recent Labs Lab 05/24/13 1850 05/24/13 2224  WBC 9.0 7.5  NEUTROABS 7.5  --   HGB 9.3* 8.6*  HCT 31.1* 28.8*  MCV 73.5* 73.8*  PLT 240 222   Cardiac Enzymes:  Recent Labs Lab 05/24/13 1850 05/25/13 0155 05/25/13 0635 05/25/13 1142  TROPONINI <0.30 <0.30 <0.30 <0.30   BNP (last 3 results)  Recent Labs  07/22/12 0410 09/19/12 2052 05/24/13 1850  PROBNP 979.7* 257.8 2226.0*   CBG:  Recent Labs Lab 05/25/13 0640 05/25/13 0738 05/25/13 0744 05/25/13 0851 05/25/13 1153  GLUCAP 251* 208* 194* 162* 279*    Micro No results found for this or any  previous visit (from the past 240 hour(s)).   Studies: Dg Chest Portable 1 View  05/24/2013   CLINICAL DATA:  Altered mental status  EXAM: PORTABLE CHEST - 1 VIEW  COMPARISON:  Chest radiograph 03/24/2013  FINDINGS: Left central venous line with tip in the mid SVC and junction of the brachiocephalic vein. Stable cardiac silhouette. The right contour of the mediastinum is prominent which is felt to relate to patient rotation. Patient is rotated rightward. There is interval increase in bilateral nodular airspace disease. No pneumothorax.  IMPRESSION: 1. Left central venous line with tip at the junction of the brachiocephalic vein and SVC. No pneumothorax . 2. Exaggeration of the right mediastinal contour is felt to relate to patient rotation. 3. Bilateral nodular airspace disease suggests pulmonary edema. Cannot exclude pulmonary infection.   Electronically Signed   By: Genevive Bi M.D.   On: 05/24/2013 19:06    Scheduled Meds: . cefTRIAXone (ROCEPHIN)  IV  1 g Intravenous Q24H  . heparin  5,000 Units Subcutaneous Q8H  . insulin aspart  0-15 Units Subcutaneous TID WC  . ipratropium-albuterol  3 mL Nebulization Q4H  . methylPREDNISolone (SOLU-MEDROL) injection  60 mg Intravenous Q12H  . oseltamivir  30 mg Oral BID  . pantoprazole (PROTONIX) IV  40 mg Intravenous Q12H  . pregabalin  50 mg Oral TID   Continuous Infusions: . sodium chloride 100 mL/hr at 05/25/13 1111       Time spent: 35 minutes    [facility] A  [facility] Pager [phone] If 7PM-7AM, please contact night-coverage at [email or web address], password [facility] 05/25/2013, 12:57 PM  LOS: 1 day

## 2013-05-25 NOTE — Progress Notes (Signed)
Inpatient Diabetes Program Recommendations  AACE/ADA: New Consensus Statement on Inpatient Glycemic Control (2013)  Target Ranges:  Prepandial:   less than 140 mg/dL      Peak postprandial:   less than 180 mg/dL (1-2 hours)      Critically ill patients:  140 - 180 mg/dL   Pt off IV insulin drip however no basal insulin given.  Glucose now 279 mg/dL  Inpatient Diabetes Program Recommendations Insulin - Basal: Pt would benefit from lantus, 10 units to start  Thank you, Lenor Coffin, RN, CNS, Diabetes Coordinator [phone])

## 2013-05-25 NOTE — Progress Notes (Signed)
Echo Lab  2D Echocardiogram completed.  Naya Ilagan L Demarious Kapur, RDCS 05/25/2013 4:18 PM

## 2013-05-26 ENCOUNTER — Encounter (HOSPITAL_COMMUNITY): Payer: Self-pay

## 2013-05-26 DIAGNOSIS — J111 Influenza due to unidentified influenza virus with other respiratory manifestations: Secondary | ICD-10-CM

## 2013-05-26 LAB — BASIC METABOLIC PANEL
BUN: 44 mg/dL — ABNORMAL HIGH (ref 6–23)
CO2: 16 mEq/L — ABNORMAL LOW (ref 19–32)
Calcium: 7.6 mg/dL — ABNORMAL LOW (ref 8.4–10.5)
Chloride: 101 mEq/L (ref 96–112)
Creatinine, Ser: 1.73 mg/dL — ABNORMAL HIGH (ref 0.50–1.10)
GFR calc Af Amer: 32 mL/min — ABNORMAL LOW (ref >90)
GFR calc non Af Amer: 27 mL/min — ABNORMAL LOW (ref >90)
Glucose, Bld: 366 mg/dL — ABNORMAL HIGH (ref 70–99)
Potassium: 4.6 mEq/L (ref 3.7–5.3)
Sodium: 133 mEq/L — ABNORMAL LOW (ref 137–147)

## 2013-05-26 LAB — GLUCOSE, CAPILLARY
Glucose-Capillary: 276 mg/dL — ABNORMAL HIGH (ref 70–99)
Glucose-Capillary: 277 mg/dL — ABNORMAL HIGH (ref 70–99)
Glucose-Capillary: 91 mg/dL (ref 70–99)
Glucose-Capillary: 187 mg/dL — ABNORMAL HIGH (ref 70–99)

## 2013-05-26 LAB — URINE CULTURE: Colony Count: >=100000

## 2013-05-26 MED ORDER — BENZONATATE 100 MG PO CAPS
200.0000 mg | ORAL_CAPSULE | Freq: Three times a day (TID) | ORAL | Status: DC | PRN
  Filled 2013-05-26: qty 2

## 2013-05-26 MED ORDER — IPRATROPIUM-ALBUTEROL 0.5-2.5 (3) MG/3ML IN SOLN
3.0000 mL | Freq: Four times a day (QID) | RESPIRATORY_TRACT | Status: DC
  Administered 2013-05-26 – 2013-05-29 (×12): 3 mL via RESPIRATORY_TRACT
  Filled 2013-05-26 (×14): qty 3

## 2013-05-26 MED ORDER — INSULIN GLARGINE 100 UNIT/ML ~~LOC~~ SOLN
25.0000 [IU] | Freq: Every day | SUBCUTANEOUS | Status: DC
  Administered 2013-05-26 – 2013-05-28 (×3): 25 [IU] via SUBCUTANEOUS
  Filled 2013-05-26 (×4): qty 0.25

## 2013-05-26 MED ORDER — OSELTAMIVIR PHOSPHATE 30 MG PO CAPS
30.0000 mg | ORAL_CAPSULE | Freq: Every day | ORAL | Status: DC
  Administered 2013-05-27: 30 mg via ORAL
  Filled 2013-05-26 (×2): qty 1

## 2013-05-26 MED ORDER — GUAIFENESIN-DM 100-10 MG/5ML PO SYRP
5.0000 mL | ORAL_SOLUTION | ORAL | Status: DC | PRN
  Administered 2013-05-26: 5 mL via ORAL
  Filled 2013-05-26 (×2): qty 5

## 2013-05-26 NOTE — Progress Notes (Signed)
[facility] PROGRESS NOTE  MAYLIN KESSELRING KPV:[id number] DOB: August 23, 1933 DOA: 05/24/2013 PCP: Billee Cashing, MD  HPI/Subjective: Eating breakfast, feeling better  Assessment/Plan: Principal Problem:   DKA (diabetic ketoacidoses) Active Problems:   HYPERTENSION   Diarrhea   LBBB (left bundle branch block)   CHF (congestive heart failure)   Influenza A with respiratory manifestations   CKD (chronic kidney disease), stage III   Influenza type A with respiratory manifestations -Patient did have some cough and myalgia -Tested positive for type A influenza, started on Tamiflu adjusted to her renal function. -Symptomatic management with bronchodilators and mucolytics, oxygen as needed.  UTI -Her urinalysis consistent with UTI. -Patient started on Rocephin day #3/5 -multiple colonies  Hyperglycemic hyperosmolar state. -Blood sugar is 670, no evidence of acidosis with bicarbonate of 22, pH is 7.33 from VBG (typically 0.02-0.05 lower than ABG). -Treated with IV insulin therapy, now she is off of glucostabilizer protocol. -She's not on insulin for home, started on insulin sliding scale. -Carbohydrate modified diet -lantus started and increased -HgbA1C 13.7  Acute on chronic renal failure -Patient has stage III CKD with baseline creatinine of 1.0 in October of 2014. -She has presented with creatinine of 2.0 D/c IVF -This is likely secondary to volume depletion from hyperglycemic state. -Followup closely with renal function and strict intake and output.  LBBB -old per cardiology CE negative   PT/OT eval -worry about if patient will be able to give self insulin at home- diabetic consult placed  Code Status: Full code Family Communication: Plan discussed with the patient. Disposition Plan: Remains inpatient   Consultants:  None  Procedures:  None  Antibiotics:  rocephin   Objective: Filed Vitals:   05/26/13 0604  BP: 128/75  Pulse: 87  Temp:  97 F (36.1 C)  Resp: 18    Intake/Output Summary (Last 24 hours) at 05/26/13 0912 Last data filed at 05/26/13 0100  Gross per 24 hour  Intake    770 ml  Output    200 ml  Net    570 ml   Filed Weights   05/24/13 2216 05/25/13 0400 05/26/13 0500  Weight: 81.829 kg (180 lb 6.4 oz) 81.829 kg (180 lb 6.4 oz) 87.5 kg (192 lb 14.4 oz)    Exam: General: Alert and awake, oriented x3, not in any acute distress. HEENT: anicteric sclera, pupils reactive to light and accommodation, EOMI CVS: S1-S2 clear, no murmur rubs or gallops Chest: clear to auscultation bilaterally, no wheezing, rales or rhonchi Abdomen: soft nontender, nondistended, normal bowel sounds, no organomegaly Extremities: no cyanosis, clubbing or edema noted bilaterally Neuro: Cranial nerves II-XII intact, no focal neurological deficits  Data Reviewed: Basic Metabolic Panel:  Recent Labs Lab 05/24/13 2224 05/25/13 0155 05/25/13 0440 05/25/13 0635 05/26/13 0440  NA 148* 148* 150* 146 133*  K 3.1* 2.9* 3.7 3.8 4.6  CL 109 109 112 110 101  CO2 19 20 21 20  16*  GLUCOSE 353* 258* 196* 255* 366*  BUN 43* 44* 43* 44* 44*  CREATININE 1.96* 2.03* 2.00* 1.99* 1.73*  CALCIUM 8.0* 8.1* 8.1* 8.0* 7.6*   Liver Function Tests: No results found for this basename: AST, ALT, ALKPHOS, BILITOT, PROT, ALBUMIN,  in the last 168 hours No results found for this basename: LIPASE, AMYLASE,  in the last 168 hours No results found for this basename: AMMONIA,  in the last 168 hours CBC:  Recent Labs Lab 05/24/13 1850 05/24/13 2224  WBC 9.0 7.5  NEUTROABS 7.5  --   HGB  9.3* 8.6*  HCT 31.1* 28.8*  MCV 73.5* 73.8*  PLT 240 222   Cardiac Enzymes:  Recent Labs Lab 05/24/13 1850 05/25/13 0155 05/25/13 0635 05/25/13 1142  TROPONINI <0.30 <0.30 <0.30 <0.30   BNP (last 3 results)  Recent Labs  07/22/12 0410 09/19/12 2052 05/24/13 1850  PROBNP 979.7* 257.8 2226.0*   CBG:  Recent Labs Lab 05/25/13 0744 05/25/13 0851  05/25/13 1153 05/25/13 1648 05/26/13 0901  GLUCAP 194* 162* 279* 357* 276*    Micro Recent Results (from the past 240 hour(s))  URINE CULTURE     Status: None   Collection Time    05/24/13  7:59 PM      Result Value Range Status   Specimen Description URINE, CATHETERIZED   Final   Special Requests NONE   Final   Culture  Setup Time     Final   Value: 05/24/2013 21:45     [facility]formed at [facility]   Colony Count     Final   Value: >=100,000 COLONIES/ML     [facility]formed at [facility]   Culture     Final   Value: Multiple bacterial morphotypes present, none predominant. Suggest appropriate recollection if clinically indicated.     Performed at [facility]   Report Status 05/26/2013 FINAL   Final     Studies: Dg Chest Portable 1 View  05/24/2013   CLINICAL DATA:  Altered mental status  EXAM: PORTABLE CHEST - 1 VIEW  COMPARISON:  Chest radiograph 03/24/2013  FINDINGS: Left central venous line with tip in the mid SVC and junction of the brachiocephalic vein. Stable cardiac silhouette. The right contour of the mediastinum is prominent which is felt to relate to patient rotation. Patient is rotated rightward. There is interval increase in bilateral nodular airspace disease. No pneumothorax.  IMPRESSION: 1. Left central venous line with tip at the junction of the brachiocephalic vein and SVC. No pneumothorax . 2. Exaggeration of the right mediastinal contour is felt to relate to patient rotation. 3. Bilateral nodular airspace disease suggests pulmonary edema. Cannot exclude pulmonary infection.   Electronically Signed   By: Genevive BiStewart  Edmunds M.D.   On: 05/24/2013 19:06    Scheduled Meds: . cefTRIAXone (ROCEPHIN)  IV  1 g Intravenous Q24H  . heparin  5,000 Units Subcutaneous Q8H  . insulin aspart  0-15 Units Subcutaneous TID WC  . insulin glargine  25 Units Subcutaneous QHS  . ipratropium-albuterol  3 mL Nebulization Q6H  . oseltamivir  30 mg Oral BID  .  pantoprazole  40 mg Oral BID  . pregabalin  50 mg Oral TID   Continuous Infusions: . sodium chloride 100 mL/hr at 05/26/13 0902       Time spent: 35 minutes    Tieara Flitton  [facility] Pager [phone] If 7PM-7AM, please contact night-coverage at [email or web address], password [facility] 05/26/2013, 9:12 AM  LOS: 2 days

## 2013-05-26 NOTE — Progress Notes (Signed)
Inpatient Diabetes Program Recommendations  AACE/ADA: New Consensus Statement on Inpatient Glycemic Control (2013)  Target Ranges:  Prepandial:   less than 140 mg/dL      Peak postprandial:   less than 180 mg/dL (1-2 hours)      Critically ill patients:  140 - 180 mg/dL   Reason for Visit: Hyperglycemia  Results for Glenda Gonzalez, Glenda Gonzalez (MRN [id number]) as of 05/26/2013 15:20  Ref. Range 05/25/2013 08:51 05/25/2013 11:53 05/25/2013 16:48 05/26/2013 09:01 05/26/2013 11:27  Glucose-Capillary Latest Range: 70-99 mg/dL 794 (H) 801 (H) 655 (H) 276 (H) 277 (H)  Results for Glenda Gonzalez, Glenda Gonzalez (MRN [id number]) as of 05/26/2013 15:20  Ref. Range 05/26/2013 04:40  Sodium Latest Range: 137-147 mEq/L 133 (L)  Potassium Latest Range: 3.7-5.3 mEq/L 4.6  Chloride Latest Range: 96-112 mEq/L 101  CO2 Latest Range: 19-32 mEq/L 16 (L)  BUN Latest Range: 6-23 mg/dL 44 (H)  Creatinine Latest Range: 0.50-1.10 mg/dL 6.75 (H)  Calcium Latest Range: 8.4-10.5 mg/dL 7.6 (L)  GFR calc non Af Amer Latest Range: >90 mL/min 27 (L)  GFR calc Af Amer Latest Range: >90 mL/min 32 (L)  Glucose Latest Range: 70-99 mg/dL 449 (H)   Noted CO2 down to 16.   AG - 16. Continues with hyperglycemia. Will clearly need insulin at home, but doubtful pt will comply. Will pt be discharged to home? If so, will need family member to learn insulin administration.   Inpatient Diabetes Program Recommendations Insulin - Basal: Lantus increased to 25 units QHS Correction (SSI): Add HS correction Insulin - Meal Coverage: Consider addition of meal coverage insulin - Novolog 4 units tidwc if pt eats >50% meal  Note: Will follow while inpatient. Thank you. Ailene Ards, RD, LDN, CDE Inpatient Diabetes Coordinator [phone]

## 2013-05-27 LAB — BASIC METABOLIC PANEL
BUN: 36 mg/dL — ABNORMAL HIGH (ref 6–23)
CHLORIDE: 110 meq/L (ref 96–112)
CO2: 16 mEq/L — ABNORMAL LOW (ref 19–32)
CREATININE: 1.71 mg/dL — AB (ref 0.50–1.10)
Calcium: 7.2 mg/dL — ABNORMAL LOW (ref 8.4–10.5)
GFR, EST AFRICAN AMERICAN: 32 mL/min — AB (ref >90)
GFR, EST NON AFRICAN AMERICAN: 28 mL/min — AB (ref >90)
Glucose, Bld: 148 mg/dL — ABNORMAL HIGH (ref 70–99)
Potassium: 4.5 mEq/L (ref 3.7–5.3)
Sodium: 142 mEq/L (ref 137–147)

## 2013-05-27 LAB — GLUCOSE, CAPILLARY
Glucose-Capillary: 153 mg/dL — ABNORMAL HIGH (ref 70–99)
Glucose-Capillary: 166 mg/dL — ABNORMAL HIGH (ref 70–99)
Glucose-Capillary: 120 mg/dL — ABNORMAL HIGH (ref 70–99)
Glucose-Capillary: 218 mg/dL — ABNORMAL HIGH (ref 70–99)

## 2013-05-27 MED ORDER — INSULIN ASPART 100 UNIT/ML ~~LOC~~ SOLN
4.0000 [IU] | Freq: Three times a day (TID) | SUBCUTANEOUS | Status: DC
  Administered 2013-05-27 – 2013-05-29 (×6): 4 [IU] via SUBCUTANEOUS

## 2013-05-27 MED ORDER — ALBUTEROL SULFATE (2.5 MG/3ML) 0.083% IN NEBU
2.5000 mg | INHALATION_SOLUTION | RESPIRATORY_TRACT | Status: DC | PRN
  Administered 2013-05-28: 2.5 mg via RESPIRATORY_TRACT
  Filled 2013-05-27: qty 3

## 2013-05-27 NOTE — Progress Notes (Signed)
[facility] PROGRESS NOTE  Glenda Gonzalez [id number]:[id number] DOB: 07-08-1933 DOA: 05/24/2013 PCP: Billee Cashing, MD  HPI/Subjective: Eating breakfast, feeling better  Assessment/Plan: Principal Problem:   DKA (diabetic ketoacidoses) Active Problems:   HYPERTENSION   Diarrhea   LBBB (left bundle branch block)   CHF (congestive heart failure)   Influenza A with respiratory manifestations   CKD (chronic kidney disease), stage III   Influenza type A with respiratory manifestations -Patient did have some cough and myalgia -Tested positive for type A influenza, started on Tamiflu adjusted to her renal function. -Symptomatic management with bronchodilators and mucolytics, oxygen as needed.  UTI -Her urinalysis consistent with UTI. -Patient started on Rocephin day #4/5 -multiple colonies  Hyperglycemic hyperosmolar state. -Blood sugar is 670, no evidence of acidosis with bicarbonate of 22, pH is 7.33 from VBG (typically 0.02-0.05 lower than ABG). -Treated with IV insulin therapy, now she is off of glucostabilizer protocol. -She's not on insulin for home, started on insulin sliding scale. -Carbohydrate modified diet -lantus started and increased- added SSI and meal coverage-- will need education -HgbA1C 13.7  Acute on chronic renal failure -Patient has stage III CKD with baseline creatinine of 1.0 in October of 2014. -She has presented with creatinine of 2.0 D/c IVF -This is likely secondary to volume depletion from hyperglycemic state. -Followup closely with renal function and strict intake and output.  LBBB -old per cardiology CE negative   PT/OT eval -worry about if patient will be able to give self insulin at home- diabetic consult placed  Code Status: Full code Family Communication: Plan discussed with the patient. Disposition Plan: Remains inpatient   Consultants:  None  Procedures:  None  Antibiotics:  rocephin   Objective: Filed Vitals:    05/27/13 0504  BP: 117/52  Pulse: 83  Temp: 98.5 F (36.9 C)  Resp: 18    Intake/Output Summary (Last 24 hours) at 05/27/13 1022 Last data filed at 05/27/13 0900  Gross per 24 hour  Intake    800 ml  Output   1100 ml  Net   -300 ml   Filed Weights   05/25/13 0400 05/26/13 0500 05/27/13 0504  Weight: 81.829 kg (180 lb 6.4 oz) 87.5 kg (192 lb 14.4 oz) 87.6 kg (193 lb 2 oz)    Exam: General: Alert and awake, oriented x3, not in any acute distress. CVS: S1-S2 clear, no murmur rubs or gallops Chest: clear to auscultation bilaterally, no wheezing, rales or rhonchi Abdomen: soft nontender, nondistended, normal bowel sounds, no organomegaly Extremities: no cyanosis, clubbing or edema noted bilaterally Neuro: Cranial nerves II-XII intact, no focal neurological deficits  Data Reviewed: Basic Metabolic Panel:  Recent Labs Lab 05/25/13 0155 05/25/13 0440 05/25/13 0635 05/26/13 0440 05/27/13 0550  NA 148* 150* 146 133* 142  K 2.9* 3.7 3.8 4.6 4.5  CL 109 112 110 101 110  CO2 20 21 20  16* 16*  GLUCOSE 258* 196* 255* 366* 148*  BUN 44* 43* 44* 44* 36*  CREATININE 2.03* 2.00* 1.99* 1.73* 1.71*  CALCIUM 8.1* 8.1* 8.0* 7.6* 7.2*   Liver Function Tests: No results found for this basename: AST, ALT, ALKPHOS, BILITOT, PROT, ALBUMIN,  in the last 168 hours No results found for this basename: LIPASE, AMYLASE,  in the last 168 hours No results found for this basename: AMMONIA,  in the last 168 hours CBC:  Recent Labs Lab 05/24/13 1850 05/24/13 2224  WBC 9.0 7.5  NEUTROABS 7.5  --   HGB 9.3* 8.6*  HCT  31.1* 28.8*  MCV 73.5* 73.8*  PLT 240 222   Cardiac Enzymes:  Recent Labs Lab 05/24/13 1850 05/25/13 0155 05/25/13 0635 05/25/13 1142  TROPONINI <0.30 <0.30 <0.30 <0.30   BNP (last 3 results)  Recent Labs  07/22/12 0410 09/19/12 2052 05/24/13 1850  PROBNP 979.7* 257.8 2226.0*   CBG:  Recent Labs Lab 05/26/13 0901 05/26/13 1127 05/26/13 1639  05/26/13 2052 05/27/13 0733  GLUCAP 276* 277* 91 187* 153*    Micro Recent Results (from the past 240 hour(s))  URINE CULTURE     Status: None   Collection Time    05/24/13  7:59 PM      Result Value Range Status   Specimen Description URINE, CATHETERIZED   Final   Special Requests NONE   Final   Culture  Setup Time     Final   Value: 05/24/2013 21:45     Performed at [facility]   Colony Count     Final   Value: >=100,000 COLONIES/ML     Performed at [facility]   Culture     Final   Value: Multiple bacterial morphotypes present, none predominant. Suggest appropriate recollection if clinically indicated.     Performed at [facility]   Report Status 05/26/2013 FINAL   Final     Studies: No results found.  Scheduled Meds: . cefTRIAXone (ROCEPHIN)  IV  1 g Intravenous Q24H  . heparin  5,000 Units Subcutaneous Q8H  . insulin aspart  0-15 Units Subcutaneous TID WC  . insulin aspart  4 Units Subcutaneous TID WC  . insulin glargine  25 Units Subcutaneous QHS  . ipratropium-albuterol  3 mL Nebulization Q6H  . oseltamivir  30 mg Oral Daily  . pantoprazole  40 mg Oral BID  . pregabalin  50 mg Oral TID   Continuous Infusions:       Time spent: 35 minutes    Shawne Bulow  [facility] Pager [phone] If 7PM-7AM, please contact night-coverage at [email or web address], password [facility] 05/27/2013, 10:22 AM  LOS: 3 days

## 2013-05-27 NOTE — Progress Notes (Signed)
Read, reviewed, edited and agree with student's findings and recommendations.  Gaynor Genco B. Raley Novicki, PT, DPT #[phone]  

## 2013-05-27 NOTE — Progress Notes (Signed)
Family refuses SNF and insulin  Aerica Rincon DO

## 2013-05-27 NOTE — Progress Notes (Signed)
Occupational Therapy Evaluation Patient Details Name: Glenda Gonzalez MRN: [id number] DOB: May 24, 1934 Today's Date: 05/27/2013 Time: 1103-1594 OT Time Calculation (min): 28 min  OT Assessment / Plan / Recommendation History of present illness   DKA (diabetic ketoacidoses) Active Problems: HYPERTENSION Diarrhea LBBB (left bundle branch block) Cough CHF (congestive heart failure) Influenza A with respiratory manifestations   Clinical Impression   PTA, pt required assistance with all ADL. And mobility. Pt requires increased assistance at this time. Do not feel pt can safely D/C home with elderly husband as primary caregiver. Rec SNF for rehab with goal to return home. Pt will benefit from skilled OT services to facilitate D/C to next venue due to below deficits.     OT Assessment  Patient needs continued OT Services    Follow Up Recommendations  SNF 24/7 S   Barriers to Discharge   husband is elderly  Equipment Recommendations  None recommended by OT    Recommendations for Other Services    Frequency  Min 2X/week    Precautions / Restrictions Precautions Precautions: Fall   Pertinent Vitals/Pain O2 2L. 98. Dyspnea 3/4 with minimal activity    ADL  Eating/Feeding: Independent Grooming: Minimal assistance Upper Body Bathing: Moderate assistance Where Assessed - Upper Body Bathing: Supported sitting Lower Body Bathing: Maximal assistance Where Assessed - Lower Body Bathing: Supported sit to stand Upper Body Dressing: Moderate assistance Where Assessed - Upper Body Dressing: Supported sitting Lower Body Dressing: Maximal assistance Where Assessed - Lower Body Dressing: Supported sit to Pharmacist, hospital: [employer] Method: Stand pivot Equipment Used: Gait belt Transfers/Ambulation Related to ADLs: Max A. did not ambulate ADL Comments: functional decline    OT Diagnosis: Generalized weakness;Other (comment) (residual weakness form  previous CVA)  OT Problem List: Decreased strength;Decreased range of motion;Decreased activity tolerance;Impaired balance (sitting and/or standing);Decreased knowledge of use of DME or AE;Cardiopulmonary status limiting activity;Obesity OT Treatment Interventions: Self-care/ADL training;Therapeutic exercise;Energy conservation;Therapeutic activities;Patient/family education;Balance training   OT Goals(Current goals can be found in the care plan section) Acute Rehab OT Goals Patient Stated Goal: go home OT Goal Formulation: With patient Time For Goal Achievement: 06/10/13 Potential to Achieve Goals: Good  Visit Information  Last OT Received On: 05/27/13 Assistance Needed: +2       Prior Functioning     Home Living Family/patient expects to be discharged to:: Private residence Living Arrangements: Spouse/significant other;Children (husband, son. Daughter lives close by) Available Help at Discharge: Family Type of Home: House Home Access: Ramped entrance Entrance Stairs-Number of Steps: 3 Home Layout: One level Home Equipment: Bedside commode;Wheelchair - [employer] - 2 wheels Prior Function Level of Independence: Needs assistance Gait / Transfers Assistance Needed: Pt stated that she walks with RW with husband ADL's / Homemaking Assistance Needed: husband assists with self care Communication Communication: HOH Dominant Hand: Right         Vision/Perception Vision - History Baseline Vision: No visual deficits   Cognition  Cognition Arousal/Alertness: Awake/alert Behavior During Therapy: WFL for tasks assessed/performed Overall Cognitive Status: No family/caregiver present to determine baseline cognitive functioning    Extremity/Trunk Assessment Upper Extremity Assessment Upper Extremity Assessment: Generalized weakness (residual deficits) Lower Extremity Assessment Lower Extremity Assessment: Generalized weakness Cervical / Trunk Assessment Cervical / Trunk  Assessment: Normal     Mobility Bed Mobility Bed Mobility: Supine to Sit Supine to Sit: 3: Mod assist;HOB elevated Transfers Transfers: Sit to Stand;Stand to Sit Sit to Stand: From bed;2: Max assist Stand to Sit: 2: Max assist;To  chair/3-in-1 Details for Transfer Assistance: Fear of falling     Exercise     Balance Balance Balance Assessed: Yes Static Sitting Balance Static Sitting - Balance Support: Bilateral upper extremity supported;Feet supported Static Sitting - Level of Assistance: 5: Stand by assistance Static Standing Balance Static Standing - Balance Support: During functional activity;Bilateral upper extremity supported Static Standing - Level of Assistance: 2: Max assist   End of Session OT - End of Session Equipment Utilized During Treatment: Gait belt Activity Tolerance: Patient tolerated treatment well Patient left: in chair;with call bell/phone within reach;with chair alarm set Nurse Communication: Mobility status  GO     Samarth Ogle,Glenda Gonzalez 05/27/2013, 3:34 PM [facility] Tanny Harnack, OTR/L  [phone] 05/27/2013

## 2013-05-27 NOTE — Progress Notes (Signed)
05/27/13 1500  PT Visit Information  Last PT Received On 05/27/13  Assistance Needed +2  Precautions  Precautions Fall  Home Living  Family/patient expects to be discharged to: Private residence  Living Arrangements Spouse/significant other;Children (husband, son. Daughter lives close by)  Available Help at Discharge Family  Type of Home House  Home Access Ramped entrance  Entrance Stairs-Number of Steps 3  Home Layout One level  [facility];Wheelchair - [employer]manual;Walker - 2 wheels  Prior Function  Level of Independence Needs assistance  Gait / Transfers Assistance Needed reports she hasn't stood up by herself in 2 years.   ADL's / Homemaking Assistance Needed Son and husband help  Communication  Communication HOH  Cognition  Arousal/Alertness Awake/alert  Behavior During Therapy WFL for tasks assessed/performed  Overall Cognitive Status Within Functional Limits for tasks assessed  Upper Extremity Assessment  Upper Extremity Assessment Defer to OT evaluation  Lower Extremity Assessment  Lower Extremity Assessment Generalized weakness  Bed Mobility  Bed Mobility Not assessed  Transfers  Sit to Stand 1: +2 Total assist  Sit to Stand: Patient Percentage 50%  Stand to Sit 1: +2 Total assist  Stand to Sit: Patient Percentage 50%  Details for Transfer Assistance Leans posteriorly and requires A at trunk to assist in standing.  Once the motion is started, she is able to participate more into standing.  Ambulation/Gait  Ambulation/Gait Assistance 2 person Total Assist  Ambulation/Gait: Patient Percentage 90%  Ambulation Distance (Feet) 5 Feet  Assistive device (hold and support her wrists, like her son does).  She would benefit from attempting RW next session  Ambulation/Gait Assistance Details One person in front to support her at her wrists, One person supporting her trunk.  She needs a chair closeby as she quickly fatigues with activitiy  Gait Pattern Step-to  pattern;Decreased stride length  Gait velocity decreased  Stairs No  Balance  Balance Assessed No  PT - End of Session  Equipment Utilized During Treatment Gait belt  Activity Tolerance Patient limited by fatigue;Patient limited by lethargy  Patient left in chair;with call bell/phone within reach;with chair alarm set  Nurse Communication Mobility status  PT Assessment  PT Recommendation/Assessment Patient needs continued PT services  PT Problem List Decreased strength;Decreased balance;Decreased mobility;Decreased activity tolerance;Decreased coordination;Decreased knowledge of use of DME  PT Therapy Diagnosis  Difficulty walking;Generalized weakness  PT Plan  PT Frequency Min 3X/week  PT Treatment/Interventions DME instruction;Gait training;Stair training;Therapeutic exercise;Neuromuscular re-education;Therapeutic activities;Patient/family education  PT Recommendation  Follow Up Recommendations SNF  PT equipment Wheelchair (measurements PT) (For mobility with ambulation greater than household distance)  Individuals Consulted  Consulted and Agree with Results and Recommendations Patient  Acute Rehab PT Goals  Patient Stated Goal get better  PT Goal Formulation With patient  Time For Goal Achievement 06/10/13  Potential to Achieve Goals Good  PT Time Calculation  PT Start Time 1503  PT Stop Time 1540  PT Time Calculation (min) 37 min    Patient would benefit from higher level of care at a SNF.  She has decreased function in all mobility, transfers, and ambulation.  If she could go to rehab to regain some strength and function it would greatly increase her quality of life and make it safer for care by her family.  She currently is very dependent on family to help her will ADL's, and has been for a significant amount of time.  She would also benefit from this level of care for management of her medications.  She says that she most likely will refuse SNF, and if this is the case HHPT  will also benefit her.

## 2013-05-28 LAB — BASIC METABOLIC PANEL
BUN: 26 mg/dL — AB (ref 6–23)
CO2: 17 meq/L — AB (ref 19–32)
Calcium: 7.5 mg/dL — ABNORMAL LOW (ref 8.4–10.5)
Chloride: 110 mEq/L (ref 96–112)
Creatinine, Ser: 1.35 mg/dL — ABNORMAL HIGH (ref 0.50–1.10)
GFR calc Af Amer: 43 mL/min — ABNORMAL LOW (ref >90)
GFR calc non Af Amer: 37 mL/min — ABNORMAL LOW (ref >90)
GLUCOSE: 132 mg/dL — AB (ref 70–99)
POTASSIUM: 4.3 meq/L (ref 3.7–5.3)
SODIUM: 142 meq/L (ref 137–147)

## 2013-05-28 LAB — GLUCOSE, CAPILLARY
GLUCOSE-CAPILLARY: 119 mg/dL — AB (ref 70–99)
GLUCOSE-CAPILLARY: 202 mg/dL — AB (ref 70–99)
Glucose-Capillary: 114 mg/dL — ABNORMAL HIGH (ref 70–99)
Glucose-Capillary: 132 mg/dL — ABNORMAL HIGH (ref 70–99)

## 2013-05-28 MED ORDER — OSELTAMIVIR PHOSPHATE 30 MG PO CAPS
30.0000 mg | ORAL_CAPSULE | Freq: Two times a day (BID) | ORAL | Status: DC
  Administered 2013-05-28 – 2013-05-29 (×3): 30 mg via ORAL
  Filled 2013-05-28 (×5): qty 1

## 2013-05-28 NOTE — Progress Notes (Addendum)
[facility] PROGRESS NOTE  Glenda Gonzalez ZOX:[id number] DOB: Aug 08, 1933 DOA: 05/24/2013 PCP: Billee CashingMCKENZIE, WAYLAND, MD  Glenda Gonzalez is a 80 y.o. female with Past medical history of diabetes, hypertension, coronary artery disease 60-70% diagonal block, history of CHF, recent CVA . The patient was brought in by her family member as she was not responding appropriately and was talking incoherently. As with the family since Christmas she has been having generalized weakness and lethargy and has been more tired. She has not been eating good. She also has been urinating more than her usual. Since Christmas she started with a cough but does not bring up any phlegm. Her husband was giving her Robitussin DM. There was no fever or chills noted. There was no trauma or injury. There was no incontinence of bowel or bladder or seizure-like movements. As per the family she is not taking her oral hypoglycemic medication. Patient does not remember her medication neither the family.  Family refusing insulin despite HgbA1C being > 13, does not want SNF as recommended by PT either    HPI/Subjective: Resting No cp, no SOB No fever, no chills  Assessment/Plan: Principal Problem:   DKA (diabetic ketoacidoses) Active Problems:   HYPERTENSION   Diarrhea   LBBB (left bundle branch block)   CHF (congestive heart failure)   Influenza A with respiratory manifestations   CKD (chronic kidney disease), stage III   Influenza type A with respiratory manifestations -Patient did have some cough and myalgia -Tested positive for type A influenza, started on Tamiflu adjusted to her renal function. -Symptomatic management with bronchodilators and mucolytics, oxygen as needed.  UTI -Her urinalysis consistent with UTI. -Patient started on Rocephin- end after sunday -multiple colonies  Hyperglycemic hyperosmolar state. -Blood sugar is 670, no evidence of acidosis with bicarbonate of 22, pH is 7.33 from VBG  (typically 0.02-0.05 lower than ABG). -Treated with IV insulin therapy, now she is off of glucostabilizer protocol. -She's not on insulin for home, started on insulin sliding scale.- FAMILY REFUSES PATIENT TO GO HOME ON INSULIN -Carbohydrate modified diet -lantus started and increased- added SSI and meal coverage-- will need education -HgbA1C 13.7  Acute on chronic renal failure -Patient has stage III CKD with baseline creatinine of 1.0 in October of 2014. -She has presented with creatinine of 2.0 D/c IVF -This is likely secondary to volume depletion from hyperglycemic state. -Followup closely with renal function and strict intake and output.  LBBB -old per cardiology CE negative   PT/OT eval- SNF- FAMILY REFUSED, PLANS TO TAKE HOME   Code Status: Full code Family Communication: called husband-spoke with son who answered the phone Disposition Plan: suspect d/c on Monday- home with family   Consultants:  None  Procedures:  None  Antibiotics:  rocephin   Objective: Filed Vitals:   05/28/13 0500  BP: 133/59  Pulse: 72  Temp: 98 F (36.7 C)  Resp: 18    Intake/Output Summary (Last 24 hours) at 05/28/13 0739 Last data filed at 05/27/13 1700  Gross per 24 hour  Intake    480 ml  Output    300 ml  Net    180 ml   Filed Weights   05/25/13 0400 05/26/13 0500 05/27/13 0504  Weight: 81.829 kg (180 lb 6.4 oz) 87.5 kg (192 lb 14.4 oz) 87.6 kg (193 lb 2 oz)    Exam: General: Alert and awake, oriented x3, not in any acute distress. CVS: S1-S2 clear, no murmur rubs or gallops Chest:mild expiratory wheezing Abdomen:  soft nontender, nondistended, normal bowel sounds, no organomegaly Extremities: no cyanosis, clubbing or edema noted bilaterally Neuro: Cranial nerves II-XII intact, no focal neurological deficits  Data Reviewed: Basic Metabolic Panel:  Recent Labs Lab 05/25/13 0155 05/25/13 0440 05/25/13 0635 05/26/13 0440 05/27/13 0550  NA 148* 150* 146  133* 142  K 2.9* 3.7 3.8 4.6 4.5  CL 109 112 110 101 110  CO2 20 21 20  16* 16*  GLUCOSE 258* 196* 255* 366* 148*  BUN 44* 43* 44* 44* 36*  CREATININE 2.03* 2.00* 1.99* 1.73* 1.71*  CALCIUM 8.1* 8.1* 8.0* 7.6* 7.2*   Liver Function Tests: No results found for this basename: AST, ALT, ALKPHOS, BILITOT, PROT, ALBUMIN,  in the last 168 hours No results found for this basename: LIPASE, AMYLASE,  in the last 168 hours No results found for this basename: AMMONIA,  in the last 168 hours CBC:  Recent Labs Lab 05/24/13 1850 05/24/13 2224  WBC 9.0 7.5  NEUTROABS 7.5  --   HGB 9.3* 8.6*  HCT 31.1* 28.8*  MCV 73.5* 73.8*  PLT 240 222   Cardiac Enzymes:  Recent Labs Lab 05/24/13 1850 05/25/13 0155 05/25/13 0635 05/25/13 1142  TROPONINI <0.30 <0.30 <0.30 <0.30   BNP (last 3 results)  Recent Labs  07/22/12 0410 09/19/12 2052 05/24/13 1850  PROBNP 979.7* 257.8 2226.0*   CBG:  Recent Labs Lab 05/26/13 2052 05/27/13 0733 05/27/13 1129 05/27/13 1644 05/27/13 2114  GLUCAP 187* 153* 166* 120* 218*    Micro Recent Results (from the past 240 hour(s))  URINE CULTURE     Status: None   Collection Time    05/24/13  7:59 PM      Result Value Range Status   Specimen Description URINE, CATHETERIZED   Final   Special Requests NONE   Final   Culture  Setup Time     Final   Value: 05/24/2013 21:45     Performed at [facility] Count     Final   Value: >=100,000 COLONIES/ML     Performed at [facility]   Culture     Final   Value: Multiple bacterial morphotypes present, none predominant. Suggest appropriate recollection if clinically indicated.     Performed at [facility]   Report Status 05/26/2013 FINAL   Final     Studies: No results found.  Scheduled Meds: . cefTRIAXone (ROCEPHIN)  IV  1 g Intravenous Q24H  . heparin  5,000 Units Subcutaneous Q8H  . insulin aspart  0-15 Units Subcutaneous TID WC  . insulin aspart  4 Units  Subcutaneous TID WC  . insulin glargine  25 Units Subcutaneous QHS  . ipratropium-albuterol  3 mL Nebulization Q6H  . oseltamivir  30 mg Oral Daily  . pantoprazole  40 mg Oral BID  . pregabalin  50 mg Oral TID   Continuous Infusions:       Time spent: 35 minutes    Caroleena Paolini  [facility] Pager [phone] If 7PM-7AM, please contact night-coverage at [email or web address], password [facility] 05/28/2013, 7:39 AM  LOS: 4 days

## 2013-05-28 NOTE — Progress Notes (Signed)
02 satuaration on room air 94-95%.

## 2013-05-28 NOTE — Progress Notes (Signed)
Clinical Social Work Department BRIEF PSYCHOSOCIAL ASSESSMENT 05/28/2013  Patient:  Glenda Gonzalez, Glenda Gonzalez     Account Number:  [id number]     Admit date:  05/24/2013  Clinical Social Worker:  Venia Minks  Date/Time:  05/28/2013 12:00 M  Referred by:  Physician  Date Referred:  05/28/2013 Referred for  SNF Placement   Other Referral:   Interview type:  Patient Other interview type:    PSYCHOSOCIAL DATA Living Status:  FAMILY Admitted from facility:   Level of care:   Primary support name:  Lillyanna Glandon Primary support relationship to patient:  SPOUSE Degree of support available:   good    CURRENT CONCERNS Current Concerns  Post-Acute Placement   Other Concerns:    SOCIAL WORK ASSESSMENT / PLAN CSW met with patient. patient is alert and oriented X3. patient in need of snf placement. patient states that she has been to snf before and she is not going back. She states that she has two sons living with her, in addition to her husband. She states that they will take care of her and she will not be going to snf.   Assessment/plan status:   Other assessment/ plan:   Information/referral to community resources:    PATIENT'S/FAMILY'S RESPONSE TO PLAN OF CARE: patient adamantly refusing snf. Will not discuss it. No further CSW needs noted. signing off.

## 2013-05-29 DIAGNOSIS — D638 Anemia in other chronic diseases classified elsewhere: Secondary | ICD-10-CM

## 2013-05-29 LAB — BASIC METABOLIC PANEL
BUN: 17 mg/dL (ref 6–23)
CO2: 19 mEq/L (ref 19–32)
Calcium: 7.6 mg/dL — ABNORMAL LOW (ref 8.4–10.5)
Chloride: 111 mEq/L (ref 96–112)
Creatinine, Ser: 1.11 mg/dL — ABNORMAL HIGH (ref 0.50–1.10)
GFR calc Af Amer: 54 mL/min — ABNORMAL LOW (ref >90)
GFR, EST NON AFRICAN AMERICAN: 47 mL/min — AB (ref >90)
Glucose, Bld: 83 mg/dL (ref 70–99)
Potassium: 3.9 mEq/L (ref 3.7–5.3)
Sodium: 142 mEq/L (ref 137–147)

## 2013-05-29 LAB — GLUCOSE, CAPILLARY
Glucose-Capillary: 93 mg/dL (ref 70–99)
Glucose-Capillary: 126 mg/dL — ABNORMAL HIGH (ref 70–99)

## 2013-05-29 LAB — CBC
HCT: 23.8 % — ABNORMAL LOW (ref 36.0–46.0)
Hemoglobin: 7.2 g/dL — ABNORMAL LOW (ref 12.0–15.0)
MCH: 21.8 pg — AB (ref 26.0–34.0)
MCHC: 30.3 g/dL (ref 30.0–36.0)
MCV: 72.1 fL — ABNORMAL LOW (ref 78.0–100.0)
Platelets: 189 10*3/uL (ref 150–400)
RBC: 3.3 MIL/uL — ABNORMAL LOW (ref 3.87–5.11)
RDW: 21.8 % — ABNORMAL HIGH (ref 11.5–15.5)
WBC: 7.6 10*3/uL (ref 4.0–10.5)

## 2013-05-29 MED ORDER — ALBUTEROL SULFATE HFA 108 (90 BASE) MCG/ACT IN AERS: 2.0000 | INHALATION_SPRAY | Freq: Four times a day (QID) | RESPIRATORY_TRACT | Status: DC | PRN

## 2013-05-29 MED ORDER — FUROSEMIDE 10 MG/ML IJ SOLN
20.0000 mg | Freq: Once | INTRAMUSCULAR | Status: AC
  Administered 2013-05-29: 20 mg via INTRAVENOUS
  Filled 2013-05-29: qty 2

## 2013-05-29 MED ORDER — BENZONATATE 200 MG PO CAPS: 200.0000 mg | ORAL_CAPSULE | Freq: Three times a day (TID) | ORAL | Status: DC | PRN

## 2013-05-29 MED ORDER — GLIPIZIDE 10 MG PO TABS: 10.0000 mg | ORAL_TABLET | Freq: Every day | ORAL | Status: DC

## 2013-05-29 MED ORDER — METFORMIN HCL 500 MG PO TABS: 500.0000 mg | ORAL_TABLET | Freq: Two times a day (BID) | ORAL | Status: DC

## 2013-05-29 NOTE — Progress Notes (Addendum)
[facility] PROGRESS NOTE  Glenda FinlandJeannette R Gonzalez [id number] DOB: March 04, 1934 DOA: 05/24/2013 PCP: Billee CashingMCKENZIE, WAYLAND, MD  Glenda Gonzalez is a 80 y.o. female with Past medical history of diabetes, hypertension, coronary artery disease 60-70% diagonal block, history of CHF, recent CVA . The patient was brought in by her family member as she was not responding appropriately and was talking incoherently. As with the family since Christmas she has been having generalized weakness and lethargy and has been more tired. She has not been eating good. She also has been urinating more than her usual. Since Christmas she started with a cough but does not bring up any phlegm. Her husband was giving her Robitussin DM. There was no fever or chills noted. There was no trauma or injury. There was no incontinence of bowel or bladder or seizure-like movements. As per the family she is not taking her oral hypoglycemic medication. Patient does not remember her medication neither the family.  Family refusing insulin despite HgbA1C being > 13, does not want SNF as recommended by PT either Apparently patient is not ambulatory at baseline   HPI/Subjective: Resting No cp, no SOB No fever, no chills  Assessment/Plan: Principal Problem:   DKA (diabetic ketoacidoses) Active Problems:   HYPERTENSION   Diarrhea   LBBB (left bundle branch block)   CHF (congestive heart failure)   Influenza A with respiratory manifestations   CKD (chronic kidney disease), stage III   Influenza type A with respiratory manifestations -Patient did have some cough and myalgia -Tested positive for type A influenza, started on Tamiflu adjusted to her renal function. -Symptomatic management with bronchodilators and mucolytics, oxygen as needed. -wheezing- albuterol- resolved -not requiring O2 95%  UTI -Her urinalysis consistent with UTI. -Patient started on Rocephin- end after sunday -multiple colonies  Hyperglycemic  hyperosmolar state. -Blood sugar is 670, no evidence of acidosis with bicarbonate of 22, pH is 7.33 from VBG (typically 0.02-0.05 lower than ABG). -Treated with IV insulin therapy, now she is off of glucostabilizer protocol. -She's not on insulin for home, started on insulin sliding scale.- FAMILY REFUSES PATIENT TO GO HOME ON INSULIN -Carbohydrate modified diet -lantus started and increased- added SSI and meal coverage--  -HgbA1C 13.7 -family not sure what she is on- plan to start metformin and glucotrol at d/c  Acute on chronic renal failure -Patient has stage III CKD with baseline creatinine of 1.0 in October of 2014. -She has presented with creatinine of 2.0 D/c IVF -This is likely secondary to volume depletion from hyperglycemic state. -Followup closely with renal function and strict intake and output.  LBBB -old per cardiology CE negative  Anemia -has gotten lots of IVF -monitor -outpatient work up -patient's performance status is limited at baseline -no SOB  PT/OT eval- SNF- FAMILY REFUSED, PLANS TO TAKE HOME   Code Status: Full code Family Communication: await family to come in Disposition Plan: suspect d/c on Monday- home with family   Consultants:  None  Procedures:  None  Antibiotics:  rocephin   Objective: Filed Vitals:   05/29/13 0618  BP: 152/72  Pulse: 73  Temp: 98.5 F (36.9 C)  Resp: 20    Intake/Output Summary (Last 24 hours) at 05/29/13 1320 Last data filed at 05/29/13 1124  Gross per 24 hour  Intake    500 ml  Output    701 ml  Net   -201 ml   Filed Weights   05/26/13 0500 05/27/13 0504 05/29/13 0618  Weight: 87.5 kg (192 lb  14.4 oz) 87.6 kg (193 lb 2 oz) 87.136 kg (192 lb 1.6 oz)    Exam: General: Alert and awake, oriented x3, not in any acute distress. CVS: S1-S2 clear, no murmur rubs or gallops Chest: b/l wheezing- resolvd after neb Abdomen: soft nontender, nondistended, normal bowel sounds, no organomegaly Extremities:  no cyanosis, clubbing or edema noted bilaterally Neuro: Cranial nerves II-XII intact, no focal neurological deficits  Data Reviewed: Basic Metabolic Panel:  Recent Labs Lab 05/25/13 0635 05/26/13 0440 05/27/13 0550 05/28/13 0615 05/29/13 0500  NA 146 133* 142 142 142  K 3.8 4.6 4.5 4.3 3.9  CL 110 101 110 110 111  CO2 20 16* 16* 17* 19  GLUCOSE 255* 366* 148* 132* 83  BUN 44* 44* 36* 26* 17  CREATININE 1.99* 1.73* 1.71* 1.35* 1.11*  CALCIUM 8.0* 7.6* 7.2* 7.5* 7.6*   Liver Function Tests: No results found for this basename: AST, ALT, ALKPHOS, BILITOT, PROT, ALBUMIN,  in the last 168 hours No results found for this basename: LIPASE, AMYLASE,  in the last 168 hours No results found for this basename: AMMONIA,  in the last 168 hours CBC:  Recent Labs Lab 05/24/13 1850 05/24/13 2224 05/29/13 0500  WBC 9.0 7.5 7.6  NEUTROABS 7.5  --   --   HGB 9.3* 8.6* 7.2*  HCT 31.1* 28.8* 23.8*  MCV 73.5* 73.8* 72.1*  PLT 240 222 189   Cardiac Enzymes:  Recent Labs Lab 05/24/13 1850 05/25/13 0155 05/25/13 0635 05/25/13 1142  TROPONINI <0.30 <0.30 <0.30 <0.30   BNP (last 3 results)  Recent Labs  07/22/12 0410 09/19/12 2052 05/24/13 1850  PROBNP 979.7* 257.8 2226.0*   CBG:  Recent Labs Lab 05/28/13 1202 05/28/13 1645 05/28/13 2136 05/29/13 0815 05/29/13 1134  GLUCAP 114* 202* 132* 93 126*    Micro Recent Results (from the past 240 hour(s))  URINE CULTURE     Status: None   Collection Time    05/24/13  7:59 PM      Result Value Range Status   Specimen Description URINE, CATHETERIZED   Final   Special Requests NONE   Final   Culture  Setup Time     Final   Value: 05/24/2013 21:45     Performed at [facility] Count     Final   Value: >=100,000 COLONIES/ML     Performed at [facility]   Culture     Final   Value: Multiple bacterial morphotypes present, none predominant. Suggest appropriate recollection if clinically indicated.      Performed at [facility]   Report Status 05/26/2013 FINAL   Final     Studies: No results found.  Scheduled Meds: . heparin  5,000 Units Subcutaneous Q8H  . insulin aspart  0-15 Units Subcutaneous TID WC  . insulin aspart  4 Units Subcutaneous TID WC  . insulin glargine  25 Units Subcutaneous QHS  . ipratropium-albuterol  3 mL Nebulization Q6H  . oseltamivir  30 mg Oral BID  . pantoprazole  40 mg Oral BID  . pregabalin  50 mg Oral TID   Continuous Infusions:       Time spent: 35 minutes    Glenda Gonzalez  [facility] Pager [phone] If 7PM-7AM, please contact night-coverage at [email or web address], password [facility] 05/29/2013, 1:20 PM  LOS: 5 days

## 2013-05-29 NOTE — ED Provider Notes (Signed)
Medical screening examination/treatment/procedure(s) were conducted as a shared visit with non-physician practitioner(s) and myself.  I personally evaluated the patient during the encounter.  EKG Interpretation    Date/Time:  Tuesday May 24 2013 17:51:45 EST Ventricular Rate:  105 PR Interval:  161 QRS Duration: 155 QT Interval:  404 QTC Calculation: 534 R Axis:   -26 Text Interpretation:  Sinus tachycardia Left bundle branch block LBBB is new in comparison to previous EKGs, most recently 01/2013 Confirmed by Juleen China  MD, Ananda Caya [phone]) on 05/24/2013 6:53:39 PM           See other note.   Raeford Razor, MD 05/29/13 1816

## 2013-05-29 NOTE — Discharge Summary (Addendum)
Physician Discharge Summary  Glenda FinlandJeannette R Arman EAV:[id number]:[id number] DOB: 11-Jan-1934 DOA: 05/24/2013  PCP: Billee CashingMCKENZIE, WAYLAND, MD  Admit date: 05/24/2013 Discharge date: 05/29/2013  Time spent: 35 minutes  Recommendations for Outpatient Follow-up:  1. Needs tighter glucose control but refused insulin 2. Suspect glucose control will be ongoing issue 3. Cbc, bmp 1 week 4. Consider lasxi restart if kidney function improves  Discharge Diagnoses:  Principal Problem:   DKA (diabetic ketoacidoses) Active Problems:   HYPERTENSION   Diarrhea   LBBB (left bundle branch block)   CHF (congestive heart failure)   Influenza A with respiratory manifestations   CKD (chronic kidney disease), stage III   Discharge Condition: improved  Diet recommendation: cardiac/diabetic  Filed Weights   05/26/13 0500 05/27/13 0504 05/29/13 0618  Weight: 87.5 kg (192 lb 14.4 oz) 87.6 kg (193 lb 2 oz) 87.136 kg (192 lb 1.6 oz)    History of present illness:  Glenda Gonzalez is a 80 y.o. female with Past medical history of diabetes, hypertension, coronary artery disease 60-70% diagonal block, history of CHF, recent CVA .  The patient is coming from home.  The patient was brought in by her family member as she was not responding appropriately and was talking incoherently. As with the family since Christmas she has been having generalized weakness and lethargy and has been more tired. She has not been eating good. She has episodes of diarrhea since last to 3 days with one of the bowel movements being dark stools. She also has been urinating more than her usual. Since Christmas she started with a cough but does not bring up any phlegm. Her husband was giving her Robitussin DM. There was no fever or chills noted. There was no trauma or injury. There was no incontinence of bowel or bladder or seizure-like movements.  As per the family she is not taking her oral hypoglycemic medication. Patient does not remember her  medication neither the family.  She's not prescribed insulin.  She is not complaining of chest pain.   Hospital Course:  Influenza type A with respiratory manifestations  -Patient did have some cough and myalgia  -Tested positive for type A influenza, started on Tamiflu adjusted to her renal function.  -Symptomatic management with bronchodilators and mucolytics, oxygen as needed.  -wheezing- albuterol- resolved  -not requiring O2 95%   UTI  -Her urinalysis consistent with UTI.  -Patient started on Rocephin- end after sunday  -multiple colonies   Hyperglycemic hyperosmolar state.  -Blood sugar is 670, no evidence of acidosis with bicarbonate of 22, pH is 7.33 from VBG (typically 0.02-0.05 lower than ABG).   FAMILY REFUSES PATIENT TO GO HOME ON INSULIN  -Carbohydrate modified diet  -lantus started and increased- added SSI and meal coverage--  -HgbA1C 13.7  -family not sure what she is on- plan to start metformin and glucotrol at d/c   Acute on chronic renal failure  -Patient has stage III CKD with baseline creatinine of 1.0 in October of 2014.  -She has presented with creatinine of 2.0  D/c IVF  -This is likely secondary to volume depletion from hyperglycemic state.  -Followup closely with renal function and strict intake and output.   LBBB  -old per cardiology  CE negative   Anemia  -has gotten lots of IVF  -monitor  -outpatient work up  -patient's performance status is limited at baseline  -SOB not worse than baseline  Long discussion with husband, he feels patient is back to baseline, uses wheelchair at  home and ambulates very little- is SOB at baseline as well and has a cough- eating well and asking to go home   Procedures:  none  Consultations:  None    Discharge Exam: Filed Vitals:   05/29/13 1443  BP: 150/81  Pulse: 94  Temp: 98.1 F (36.7 C)  Resp: 20      Discharge Instructions      Discharge Orders   Future Orders Complete By Expires    Diet - low sodium heart healthy  As directed    Diet Carb Modified  As directed    Discharge instructions  As directed    Comments:     24 hour care by family Cbc, bmp 1 week Check blood sugars and bring to PCP   Increase activity slowly  As directed        Medication List    STOP taking these medications       furosemide 20 MG tablet  Commonly known as:  LASIX     zolpidem 10 MG tablet  Commonly known as:  AMBIEN      TAKE these medications       albuterol 108 (90 BASE) MCG/ACT inhaler  Commonly known as:  PROVENTIL HFA;VENTOLIN HFA  Inhale 2 puffs into the lungs every 6 (six) hours as needed for wheezing or shortness of breath.     benzonatate 200 MG capsule  Commonly known as:  TESSALON  Take 1 capsule (200 mg total) by mouth 3 (three) times daily as needed for cough.     bimatoprost 0.01 % Soln  Commonly known as:  LUMIGAN  Apply 1 drop to eye 2 (two) times daily.     diphenhydrAMINE 25 mg capsule  Commonly known as:  BENADRYL  Take 25 mg by mouth at bedtime as needed for itching or sleep.     donepezil 10 MG tablet  Commonly known as:  ARICEPT  Take 10 mg by mouth at bedtime.     esomeprazole 40 MG capsule  Commonly known as:  NEXIUM  Take 40 mg by mouth 2 (two) times daily.     glipiZIDE 10 MG tablet  Commonly known as:  GLUCOTROL  Take 1 tablet (10 mg total) by mouth daily before breakfast.     guaiFENesin-dextromethorphan 100-10 MG/5ML syrup  Commonly known as:  ROBITUSSIN DM  Take 5 mLs by mouth every 4 (four) hours as needed for cough.     isosorbide-hydrALAZINE 20-37.5 MG per tablet  Commonly known as:  BIDIL  Take 1 tablet by mouth 2 (two) times daily.     ketorolac 0.4 % Soln  Commonly known as:  ACULAR  Place 1 drop into both eyes 4 (four) times daily.     metFORMIN 500 MG tablet  Commonly known as:  GLUCOPHAGE  Take 1 tablet (500 mg total) by mouth 2 (two) times daily with a meal.     nitroGLYCERIN 0.4 MG SL tablet  Commonly known  as:  NITROSTAT  Place 0.4 mg under the tongue every 5 (five) minutes as needed for chest pain.     prednisoLONE acetate 1 % ophthalmic suspension  Commonly known as:  PRED FORTE  Place 1 drop into both eyes 4 (four) times daily.     pregabalin 50 MG capsule  Commonly known as:  LYRICA  Take 50 mg by mouth 3 (three) times daily.     SYSTANE ULTRA OP  Place 1 drop into both eyes daily as needed. For dry eyes  Allergies  Allergen Reactions  . Ace Inhibitors Shortness Of Breath and Other (See Comments)    Angioedema   . Codeine Other (See Comments)    hallucination  . Fentanyl Other (See Comments)    Abnormal behavior per husband  . Other Other (See Comments)    Beef and turkey-causes gout flareup  . Morphine Itching  . Penicillins Itching      The results of significant diagnostics from this hospitalization (including imaging, microbiology, ancillary and laboratory) are listed below for reference.    Significant Diagnostic Studies: Dg Chest Portable 1 View  05/24/2013   CLINICAL DATA:  Altered mental status  EXAM: PORTABLE CHEST - 1 VIEW  COMPARISON:  Chest radiograph 03/24/2013  FINDINGS: Left central venous line with tip in the mid SVC and junction of the brachiocephalic vein. Stable cardiac silhouette. The right contour of the mediastinum is prominent which is felt to relate to patient rotation. Patient is rotated rightward. There is interval increase in bilateral nodular airspace disease. No pneumothorax.  IMPRESSION: 1. Left central venous line with tip at the junction of the brachiocephalic vein and SVC. No pneumothorax . 2. Exaggeration of the right mediastinal contour is felt to relate to patient rotation. 3. Bilateral nodular airspace disease suggests pulmonary edema. Cannot exclude pulmonary infection.   Electronically Signed   By: Genevive Bi M.D.   On: 05/24/2013 19:06    Microbiology: Recent Results (from the past 240 hour(s))  URINE CULTURE     Status:  None   Collection Time    05/24/13  7:59 PM      Result Value Range Status   Specimen Description URINE, CATHETERIZED   Final   Special Requests NONE   Final   Culture  Setup Time     Final   Value: 05/24/2013 21:45     Performed at [facility] Count     Final   Value: >=100,000 COLONIES/ML     Performed at [facility]   Culture     Final   Value: Multiple bacterial morphotypes present, none predominant. Suggest appropriate recollection if clinically indicated.     Performed at [facility]   Report Status 05/26/2013 FINAL   Final     Labs: Basic Metabolic Panel:  Recent Labs Lab 05/25/13 0635 05/26/13 0440 05/27/13 0550 05/28/13 0615 05/29/13 0500  NA 146 133* 142 142 142  K 3.8 4.6 4.5 4.3 3.9  CL 110 101 110 110 111  CO2 20 16* 16* 17* 19  GLUCOSE 255* 366* 148* 132* 83  BUN 44* 44* 36* 26* 17  CREATININE 1.99* 1.73* 1.71* 1.35* 1.11*  CALCIUM 8.0* 7.6* 7.2* 7.5* 7.6*   Liver Function Tests: No results found for this basename: AST, ALT, ALKPHOS, BILITOT, PROT, ALBUMIN,  in the last 168 hours No results found for this basename: LIPASE, AMYLASE,  in the last 168 hours No results found for this basename: AMMONIA,  in the last 168 hours CBC:  Recent Labs Lab 05/24/13 1850 05/24/13 2224 05/29/13 0500  WBC 9.0 7.5 7.6  NEUTROABS 7.5  --   --   HGB 9.3* 8.6* 7.2*  HCT 31.1* 28.8* 23.8*  MCV 73.5* 73.8* 72.1*  PLT 240 222 189   Cardiac Enzymes:  Recent Labs Lab 05/24/13 1850 05/25/13 0155 05/25/13 0635 05/25/13 1142  TROPONINI <0.30 <0.30 <0.30 <0.30   BNP: BNP (last 3 results)  Recent Labs  07/22/12 0410 09/19/12 2052 05/24/13 1850  PROBNP 979.7*  257.8 2226.0*   CBG:  Recent Labs Lab 05/28/13 1202 05/28/13 1645 05/28/13 2136 05/29/13 0815 05/29/13 1134  GLUCAP 114* 202* 132* 93 126*       Signed:  Coley Kulikowski  [facility] 05/29/2013, 3:14 PM

## 2013-10-14 ENCOUNTER — Emergency Department (HOSPITAL_COMMUNITY): Payer: Medicare Other

## 2013-10-14 ENCOUNTER — Encounter (HOSPITAL_COMMUNITY): Payer: Self-pay | Admitting: Emergency Medicine

## 2013-10-14 ENCOUNTER — Inpatient Hospital Stay (HOSPITAL_COMMUNITY)
Admission: EM | Admit: 2013-10-14 | Discharge: 2013-10-19 | DRG: 637 | Disposition: A | Discharge status: Home Health | Payer: Medicare Other | Attending: Internal Medicine | Admitting: Internal Medicine

## 2013-10-14 DIAGNOSIS — Z79899 Other long term (current) drug therapy: Secondary | ICD-10-CM

## 2013-10-14 DIAGNOSIS — F0390 Unspecified dementia without behavioral disturbance: Secondary | ICD-10-CM | POA: Diagnosis present

## 2013-10-14 DIAGNOSIS — W19XXXA Unspecified fall, initial encounter: Secondary | ICD-10-CM | POA: Diagnosis present

## 2013-10-14 DIAGNOSIS — E872 Acidosis, unspecified: Secondary | ICD-10-CM

## 2013-10-14 DIAGNOSIS — E8729 Other acidosis: Secondary | ICD-10-CM | POA: Diagnosis present

## 2013-10-14 DIAGNOSIS — N390 Urinary tract infection, site not specified: Secondary | ICD-10-CM | POA: Diagnosis present

## 2013-10-14 DIAGNOSIS — K219 Gastro-esophageal reflux disease without esophagitis: Secondary | ICD-10-CM | POA: Diagnosis present

## 2013-10-14 DIAGNOSIS — E871 Hypo-osmolality and hyponatremia: Secondary | ICD-10-CM | POA: Diagnosis present

## 2013-10-14 DIAGNOSIS — E781 Pure hyperglyceridemia: Secondary | ICD-10-CM | POA: Diagnosis present

## 2013-10-14 DIAGNOSIS — I129 Hypertensive chronic kidney disease with stage 1 through stage 4 chronic kidney disease, or unspecified chronic kidney disease: Secondary | ICD-10-CM | POA: Diagnosis present

## 2013-10-14 DIAGNOSIS — Z885 Allergy status to narcotic agent status: Secondary | ICD-10-CM

## 2013-10-14 DIAGNOSIS — E7800 Pure hypercholesterolemia, unspecified: Secondary | ICD-10-CM | POA: Diagnosis present

## 2013-10-14 DIAGNOSIS — N183 Chronic kidney disease, stage 3 unspecified: Secondary | ICD-10-CM | POA: Diagnosis present

## 2013-10-14 DIAGNOSIS — D638 Anemia in other chronic diseases classified elsewhere: Secondary | ICD-10-CM | POA: Diagnosis present

## 2013-10-14 DIAGNOSIS — G250 Essential tremor: Secondary | ICD-10-CM | POA: Diagnosis present

## 2013-10-14 DIAGNOSIS — G929 Unspecified toxic encephalopathy: Secondary | ICD-10-CM | POA: Diagnosis present

## 2013-10-14 DIAGNOSIS — I10 Essential (primary) hypertension: Secondary | ICD-10-CM | POA: Diagnosis present

## 2013-10-14 DIAGNOSIS — I5030 Unspecified diastolic (congestive) heart failure: Secondary | ICD-10-CM | POA: Diagnosis present

## 2013-10-14 DIAGNOSIS — A498 Other bacterial infections of unspecified site: Secondary | ICD-10-CM | POA: Diagnosis present

## 2013-10-14 DIAGNOSIS — M109 Gout, unspecified: Secondary | ICD-10-CM | POA: Diagnosis present

## 2013-10-14 DIAGNOSIS — G92 Toxic encephalopathy: Secondary | ICD-10-CM | POA: Diagnosis present

## 2013-10-14 DIAGNOSIS — Z794 Long term (current) use of insulin: Secondary | ICD-10-CM

## 2013-10-14 DIAGNOSIS — Z9089 Acquired absence of other organs: Secondary | ICD-10-CM

## 2013-10-14 DIAGNOSIS — F03918 Unspecified dementia, unspecified severity, with other behavioral disturbance: Secondary | ICD-10-CM

## 2013-10-14 DIAGNOSIS — G9340 Encephalopathy, unspecified: Secondary | ICD-10-CM | POA: Diagnosis present

## 2013-10-14 DIAGNOSIS — F068 Other specified mental disorders due to known physiological condition: Secondary | ICD-10-CM | POA: Diagnosis present

## 2013-10-14 DIAGNOSIS — N179 Acute kidney failure, unspecified: Secondary | ICD-10-CM | POA: Diagnosis present

## 2013-10-14 DIAGNOSIS — M129 Arthropathy, unspecified: Secondary | ICD-10-CM | POA: Diagnosis present

## 2013-10-14 DIAGNOSIS — Z8249 Family history of ischemic heart disease and other diseases of the circulatory system: Secondary | ICD-10-CM

## 2013-10-14 DIAGNOSIS — E782 Mixed hyperlipidemia: Secondary | ICD-10-CM

## 2013-10-14 DIAGNOSIS — J9601 Acute respiratory failure with hypoxia: Secondary | ICD-10-CM

## 2013-10-14 DIAGNOSIS — Z888 Allergy status to other drugs, medicaments and biological substances status: Secondary | ICD-10-CM

## 2013-10-14 DIAGNOSIS — Z91018 Allergy to other foods: Secondary | ICD-10-CM

## 2013-10-14 DIAGNOSIS — G252 Other specified forms of tremor: Secondary | ICD-10-CM

## 2013-10-14 DIAGNOSIS — Z8673 Personal history of transient ischemic attack (TIA), and cerebral infarction without residual deficits: Secondary | ICD-10-CM

## 2013-10-14 DIAGNOSIS — Z87891 Personal history of nicotine dependence: Secondary | ICD-10-CM

## 2013-10-14 DIAGNOSIS — Z6835 Body mass index (BMI) 35.0-35.9, adult: Secondary | ICD-10-CM

## 2013-10-14 DIAGNOSIS — Z961 Presence of intraocular lens: Secondary | ICD-10-CM

## 2013-10-14 DIAGNOSIS — I2510 Atherosclerotic heart disease of native coronary artery without angina pectoris: Secondary | ICD-10-CM | POA: Diagnosis present

## 2013-10-14 DIAGNOSIS — E1110 Type 2 diabetes mellitus with ketoacidosis without coma: Secondary | ICD-10-CM

## 2013-10-14 DIAGNOSIS — E119 Type 2 diabetes mellitus without complications: Secondary | ICD-10-CM

## 2013-10-14 DIAGNOSIS — I509 Heart failure, unspecified: Secondary | ICD-10-CM | POA: Diagnosis present

## 2013-10-14 DIAGNOSIS — Z880 Allergy status to penicillin: Secondary | ICD-10-CM

## 2013-10-14 DIAGNOSIS — E1010 Type 1 diabetes mellitus with ketoacidosis without coma: Principal | ICD-10-CM | POA: Diagnosis present

## 2013-10-14 DIAGNOSIS — F0391 Unspecified dementia with behavioral disturbance: Secondary | ICD-10-CM

## 2013-10-14 DIAGNOSIS — I447 Left bundle-branch block, unspecified: Secondary | ICD-10-CM

## 2013-10-14 DIAGNOSIS — Z9849 Cataract extraction status, unspecified eye: Secondary | ICD-10-CM

## 2013-10-14 LAB — CBG MONITORING, ED
Glucose-Capillary: 552 mg/dL (ref 70–99)
Glucose-Capillary: 496 mg/dL — ABNORMAL HIGH (ref 70–99)
Glucose-Capillary: 416 mg/dL — ABNORMAL HIGH (ref 70–99)
Glucose-Capillary: 413 mg/dL — ABNORMAL HIGH (ref 70–99)

## 2013-10-14 LAB — CBC
HCT: 29.9 % — ABNORMAL LOW (ref 36.0–46.0)
Hemoglobin: 8.9 g/dL — ABNORMAL LOW (ref 12.0–15.0)
MCH: 22.5 pg — ABNORMAL LOW (ref 26.0–34.0)
MCHC: 29.8 g/dL — ABNORMAL LOW (ref 30.0–36.0)
MCV: 75.5 fL — AB (ref 78.0–100.0)
Platelets: 219 10*3/uL (ref 150–400)
RBC: 3.96 MIL/uL (ref 3.87–5.11)
RDW: 21.2 % — ABNORMAL HIGH (ref 11.5–15.5)
WBC: 10.9 10*3/uL — AB (ref 4.0–10.5)

## 2013-10-14 LAB — URINALYSIS, ROUTINE W REFLEX MICROSCOPIC
BILIRUBIN URINE: NEGATIVE
Glucose, UA: >1000 mg/dL — AB
HGB URINE DIPSTICK: NEGATIVE
KETONES UR: NEGATIVE mg/dL
Leukocytes, UA: NEGATIVE
Nitrite: POSITIVE — AB
PROTEIN: NEGATIVE mg/dL
Specific Gravity, Urine: 1.026 (ref 1.005–1.030)
UROBILINOGEN UA: 0.2 mg/dL (ref 0.0–1.0)
pH: 6 (ref 5.0–8.0)

## 2013-10-14 LAB — I-STAT CG4 LACTIC ACID, ED: Lactic Acid, Venous: 5.96 mmol/L — ABNORMAL HIGH (ref 0.5–2.2)

## 2013-10-14 LAB — I-STAT VENOUS BLOOD GAS, ED
ACID-BASE DEFICIT: 4 mmol/L — AB (ref 0.0–2.0)
Bicarbonate: 20.2 mEq/L (ref 20.0–24.0)
O2 SAT: 45 %
TCO2: 21 mmol/L (ref 0–100)
pCO2, Ven: 33.7 mmHg — ABNORMAL LOW (ref 45.0–50.0)
pH, Ven: 7.386 — ABNORMAL HIGH (ref 7.250–7.300)
pO2, Ven: 25 mmHg — CL (ref 30.0–45.0)

## 2013-10-14 LAB — COMPREHENSIVE METABOLIC PANEL
ALK PHOS: 223 U/L — AB (ref 39–117)
ALT: 25 U/L (ref 0–35)
AST: 30 U/L (ref 0–37)
Albumin: 3.2 g/dL — ABNORMAL LOW (ref 3.5–5.2)
BUN: 21 mg/dL (ref 6–23)
CO2: 16 meq/L — AB (ref 19–32)
CREATININE: 1.44 mg/dL — AB (ref 0.50–1.10)
Calcium: 9 mg/dL (ref 8.4–10.5)
Chloride: 83 mEq/L — ABNORMAL LOW (ref 96–112)
GFR calc Af Amer: 39 mL/min — ABNORMAL LOW (ref >90)
GFR, EST NON AFRICAN AMERICAN: 34 mL/min — AB (ref >90)
Glucose, Bld: 712 mg/dL (ref 70–99)
Potassium: 5.2 mEq/L (ref 3.7–5.3)
SODIUM: 123 meq/L — AB (ref 137–147)
Total Bilirubin: 0.4 mg/dL (ref 0.3–1.2)
Total Protein: 8.2 g/dL (ref 6.0–8.3)

## 2013-10-14 LAB — URINE MICROSCOPIC-ADD ON

## 2013-10-14 LAB — LACTIC ACID, PLASMA: Lactic Acid, Venous: 8.5 mmol/L — ABNORMAL HIGH (ref 0.5–2.2)

## 2013-10-14 LAB — PRO B NATRIURETIC PEPTIDE: Pro B Natriuretic peptide (BNP): 409 pg/mL (ref 0–450)

## 2013-10-14 LAB — TROPONIN I: Troponin I: <0.3 ng/mL (ref <0.30)

## 2013-10-14 MED ORDER — SODIUM CHLORIDE 0.9 % IV BOLUS (SEPSIS)
1000.0000 mL | INTRAVENOUS | Status: AC
  Administered 2013-10-14: 1000 mL via INTRAVENOUS

## 2013-10-14 MED ORDER — METOPROLOL TARTRATE 1 MG/ML IV SOLN
1.2500 mg | INTRAVENOUS | Status: DC | PRN
  Administered 2013-10-15: 1.25 mg via INTRAVENOUS
  Filled 2013-10-14: qty 5

## 2013-10-14 MED ORDER — SODIUM CHLORIDE 0.9 % IV BOLUS (SEPSIS)
500.0000 mL | Freq: Once | INTRAVENOUS | Status: AC
  Administered 2013-10-14: 500 mL via INTRAVENOUS

## 2013-10-14 MED ORDER — HYDRALAZINE HCL 20 MG/ML IJ SOLN
10.0000 mg | Freq: Once | INTRAMUSCULAR | Status: AC
  Administered 2013-10-14: 10 mg via INTRAVENOUS
  Filled 2013-10-14: qty 1

## 2013-10-14 MED ORDER — DEXTROSE-NACL 5-0.45 % IV SOLN: INTRAVENOUS | Status: DC

## 2013-10-14 MED ORDER — DEXTROSE 5 % IV SOLN
1.0000 g | Freq: Once | INTRAVENOUS | Status: AC
  Administered 2013-10-14: 1 g via INTRAVENOUS
  Filled 2013-10-14: qty 10

## 2013-10-14 MED ORDER — SODIUM CHLORIDE 0.9 % IV SOLN
INTRAVENOUS | Status: DC
  Administered 2013-10-14: 3.6 [IU]/h via INTRAVENOUS
  Administered 2013-10-15: 10 [IU]/h via INTRAVENOUS
  Filled 2013-10-14: qty 1

## 2013-10-14 MED ORDER — IPRATROPIUM-ALBUTEROL 0.5-2.5 (3) MG/3ML IN SOLN
3.0000 mL | Freq: Once | RESPIRATORY_TRACT | Status: AC
Start: 2013-10-14 — End: 2013-10-14
  Administered 2013-10-14: 3 mL via RESPIRATORY_TRACT
  Filled 2013-10-14: qty 3

## 2013-10-14 MED ORDER — DEXTROSE 5 % IV SOLN
1.0000 g | INTRAVENOUS | Status: DC
  Administered 2013-10-15 – 2013-10-17 (×3): 1 g via INTRAVENOUS
  Filled 2013-10-14 (×5): qty 10

## 2013-10-14 NOTE — ED Notes (Signed)
IV team at bedside at this time. 

## 2013-10-14 NOTE — ED Notes (Signed)
NS bolus infusing at this time

## 2013-10-14 NOTE — ED Notes (Signed)
Attempted report 

## 2013-10-14 NOTE — ED Notes (Addendum)
Pt's husband remains at bedside.

## 2013-10-14 NOTE — ED Notes (Signed)
Critical glucose value called from lab.  Dr. Romeo Apple made aware.

## 2013-10-14 NOTE — ED Notes (Signed)
Pt becoming disoriented at this time.  Appears restless.  Dr. Romeo Apple made aware and is at bedside.  Audible wheezing in all lung Gail Vendetti.

## 2013-10-14 NOTE — Progress Notes (Signed)
Called by ED for possible admit. On my evaluation mild confusion with hypertension and DKA. I spoke to ED attending and Hospitalist. [facility] is comfortable admitting without consult for now. We are available should the need arise.

## 2013-10-14 NOTE — ED Notes (Addendum)
Presents with feeling shaky and weak and not being able to stand since this AM. Per family she has not been eating well. CBG taken reading HIGH on meter, greater than 600. Pt is alert, weak. Reports bilateral arm tingling and bilateral arm shaking. deneis nausea, reports diarrhea. Denies pain.

## 2013-10-14 NOTE — ED Notes (Signed)
Pt returned from CT, Placed back on cardiac monitor

## 2013-10-14 NOTE — ED Notes (Signed)
Resp. Therapist called for [facility].

## 2013-10-14 NOTE — ED Notes (Signed)
Pt continues to be disoriented.  Pt denies any pain at this time.  Tremors in upper extremities.

## 2013-10-14 NOTE — ED Notes (Signed)
Pt to CT at this time.

## 2013-10-14 NOTE — ED Provider Notes (Signed)
CSN: [id number]     Arrival date & time 10/14/13  1541 History   First MD Initiated Contact with Patient 10/14/13 1630     Chief Complaint  Patient presents with  . Hyperglycemia     (Consider location/radiation/quality/duration/timing/severity/associated sxs/prior Treatment) Patient is a 80 y.o. female presenting with hyperglycemia, weakness, and cough. The history is provided by the patient.  Hyperglycemia Blood sugar level PTA:  Unknown Severity:  Unable to specify Associated symptoms: no abdominal pain, no chest pain, no dizziness, no dysuria, no fatigue, no fever, no nausea, no shortness of breath and no vomiting   Weakness This is a new problem. The current episode started more than 2 days ago. The problem occurs constantly. The problem has been gradually worsening. Pertinent negatives include no chest pain, no abdominal pain, no headaches and no shortness of breath. Nothing aggravates the symptoms. Nothing relieves the symptoms. She has tried nothing for the symptoms. The treatment provided no relief.  Cough Cough characteristics:  Productive Sputum characteristics:  Unable to specify Severity:  Mild Onset quality:  Gradual Duration:  3 days Timing:  Constant Progression:  Worsening Chronicity:  New Context comment:  At rest Relieved by:  Nothing Worsened by:  Nothing tried Ineffective treatments:  None tried Associated symptoms: no chest pain, no fever, no headaches and no shortness of breath     Past Medical History  Diagnosis Date  . GERD (gastroesophageal reflux disease)   . Hypertension   . Diverticulitis   . Gout   . Essential and other specified forms of tremor   . Dementia   . CAD (coronary artery disease)   . Anemia, chronic disease   . Left arm pain   . CHF (congestive heart failure)     Diastolic dysfunction  . Pneumonia     child  . Acute renal failure (ARF)     denies  . Seizures     r/t stroke no rx  . Arthritis   . Diabetes mellitus     no  rx for 6 months  . Stroke 2014   Past Surgical History  Procedure Laterality Date  . Dilation and curettage of uterus    . Tumor removal      abdomen  . Abdominal hysterectomy    . Cataract extraction w/phaco Left 03/30/2013    Procedure: CATARACT EXTRACTION PHACO AND INTRAOCULAR LENS PLACEMENT (IOC);  Surgeon: Shade Flood, MD;  Location: [facility] OR;  Service: Ophthalmology;  Laterality: Left;  . Appendectomy     Family History  Problem Relation Age of Onset  . Colon cancer Neg Hx   . Heart disease Father   . Heart disease Brother    History  Substance Use Topics  . Smoking status: Former Smoker -- 0.50 packs/day for 10 years    Types: Cigarettes    Quit date: 03/24/1980  . Smokeless tobacco: Never Used  . Alcohol Use: No   OB History   Grav Para Term Preterm Abortions TAB SAB Ect Mult Living                 Review of Systems  Constitutional: Negative for fever and fatigue.  HENT: Negative for congestion and drooling.   Eyes: Negative for pain.  Respiratory: Positive for cough. Negative for shortness of breath.   Cardiovascular: Negative for chest pain.  Gastrointestinal: Positive for diarrhea. Negative for nausea, vomiting and abdominal pain.  Genitourinary: Negative for dysuria and hematuria.  Musculoskeletal: Negative for back pain, gait problem and neck  pain.  Skin: Negative for color change.  Neurological: Positive for weakness. Negative for dizziness and headaches.  Hematological: Negative for adenopathy.  Psychiatric/Behavioral: Negative for behavioral problems.  All other systems reviewed and are negative.     Allergies  Ace inhibitors; Codeine; Fentanyl; Other; Morphine; and Penicillins  Home Medications   Prior to Admission medications   Medication Sig Start Date End Date Taking? Authorizing Provider  albuterol (PROVENTIL HFA;VENTOLIN HFA) 108 (90 BASE) MCG/ACT inhaler Inhale 2 puffs into the lungs every 6 (six) hours as needed for wheezing or shortness  of breath. 05/29/13   Joseph Art, DO  benzonatate (TESSALON) 200 MG capsule Take 1 capsule (200 mg total) by mouth 3 (three) times daily as needed for cough. 05/29/13   Joseph Art, DO  bimatoprost (LUMIGAN) 0.01 % SOLN Apply 1 drop to eye 2 (two) times daily.    Historical Provider, MD  diphenhydrAMINE (BENADRYL) 25 mg capsule Take 25 mg by mouth at bedtime as needed for itching or sleep.     Historical Provider, MD  donepezil (ARICEPT) 10 MG tablet Take 10 mg by mouth at bedtime.    Historical Provider, MD  esomeprazole (NEXIUM) 40 MG capsule Take 40 mg by mouth 2 (two) times daily.    Historical Provider, MD  glipiZIDE (GLUCOTROL) 10 MG tablet Take 1 tablet (10 mg total) by mouth daily before breakfast. 05/29/13   Joseph Art, DO  guaiFENesin-dextromethorphan (ROBITUSSIN DM) 100-10 MG/5ML syrup Take 5 mLs by mouth every 4 (four) hours as needed for cough.    Historical Provider, MD  isosorbide-hydrALAZINE (BIDIL) 20-37.5 MG per tablet Take 1 tablet by mouth 2 (two) times daily.    Historical Provider, MD  ketorolac (ACULAR) 0.4 % SOLN Place 1 drop into both eyes 4 (four) times daily.    Historical Provider, MD  metFORMIN (GLUCOPHAGE) 500 MG tablet Take 1 tablet (500 mg total) by mouth 2 (two) times daily with a meal. 05/29/13   Joseph Art, DO  nitroGLYCERIN (NITROSTAT) 0.4 MG SL tablet Place 0.4 mg under the tongue every 5 (five) minutes as needed for chest pain.    Historical Provider, MD  Polyethyl Glycol-Propyl Glycol (SYSTANE ULTRA OP) Place 1 drop into both eyes daily as needed. For dry eyes    Historical Provider, MD  prednisoLONE acetate (PRED FORTE) 1 % ophthalmic suspension Place 1 drop into both eyes 4 (four) times daily.    Historical Provider, MD  pregabalin (LYRICA) 50 MG capsule Take 50 mg by mouth 3 (three) times daily.    Historical Provider, MD   BP 156/80  Pulse 68  Temp(Src) 98.6 F (37 C) (Rectal)  Resp 16  SpO2 98% Physical Exam  Nursing note and vitals  reviewed. Constitutional: She is oriented to person, place, and time. She appears well-developed and well-nourished.  Morbidly obese  HENT:  Head: Normocephalic and atraumatic.  Mouth/Throat: Oropharynx is clear and moist. No oropharyngeal exudate.  Eyes: Conjunctivae and EOM are normal. Pupils are equal, round, and reactive to light.  Neck: Normal range of motion. Neck supple.  Mild mid cervical tenderness to palpation.  Cardiovascular: Normal rate, regular rhythm, normal heart sounds and intact distal pulses.  Exam reveals no gallop and no friction rub.   No murmur heard. Pulmonary/Chest: Effort normal. No respiratory distress. She has wheezes (prolonged expiratory phase with faint end expiratory wheeze heard diffusely.).  Abdominal: Soft. Bowel sounds are normal. There is no tenderness. There is no rebound and no guarding.  Musculoskeletal: Normal range of motion. She exhibits no edema and no tenderness.  Neurological: She is alert and oriented to person, place, and time.  Skin: Skin is warm and dry.  Psychiatric: She has a normal mood and affect. Her behavior is normal.    ED Course  Procedures (including critical care time) Labs Review Labs Reviewed  CBC - Abnormal; Notable for the following:    WBC 10.9 (*)    Hemoglobin 8.9 (*)    HCT 29.9 (*)    MCV 75.5 (*)    MCH 22.5 (*)    MCHC 29.8 (*)    RDW 21.2 (*)    All other components within normal limits  COMPREHENSIVE METABOLIC PANEL - Abnormal; Notable for the following:    Sodium 123 (*)    Chloride 83 (*)    CO2 16 (*)    Glucose, Bld 712 (*)    Creatinine, Ser 1.44 (*)    Albumin 3.2 (*)    Alkaline Phosphatase 223 (*)    GFR calc non Af Amer 34 (*)    GFR calc Af Amer 39 (*)    All other components within normal limits  URINALYSIS, ROUTINE W REFLEX MICROSCOPIC - Abnormal; Notable for the following:    Glucose, UA >1000 (*)    Nitrite POSITIVE (*)    All other components within normal limits  LACTIC ACID,  PLASMA - Abnormal; Notable for the following:    Lactic Acid, Venous 8.5 (*)    All other components within normal limits  URINE MICROSCOPIC-ADD ON - Abnormal; Notable for the following:    Bacteria, UA MANY (*)    All other components within normal limits  CBG MONITORING, ED - Abnormal; Notable for the following:    Glucose-Capillary >600 (*)    All other components within normal limits  I-STAT VENOUS BLOOD GAS, ED - Abnormal; Notable for the following:    pH, Ven 7.386 (*)    pCO2, Ven 33.7 (*)    pO2, Ven 25.0 (*)    Acid-base deficit 4.0 (*)    All other components within normal limits  CBG MONITORING, ED - Abnormal; Notable for the following:    Glucose-Capillary 552 (*)    All other components within normal limits  CBG MONITORING, ED - Abnormal; Notable for the following:    Glucose-Capillary 496 (*)    All other components within normal limits  I-STAT CG4 LACTIC ACID, ED - Abnormal; Notable for the following:    Lactic Acid, Venous 5.96 (*)    All other components within normal limits  CBG MONITORING, ED - Abnormal; Notable for the following:    Glucose-Capillary 416 (*)    All other components within normal limits  CBG MONITORING, ED - Abnormal; Notable for the following:    Glucose-Capillary 413 (*)    All other components within normal limits  CBG MONITORING, ED - Abnormal; Notable for the following:    Glucose-Capillary 394 (*)    All other components within normal limits  URINE CULTURE  TROPONIN I  PRO B NATRIURETIC PEPTIDE  CBC  BASIC METABOLIC PANEL  BASIC METABOLIC PANEL  BASIC METABOLIC PANEL  BASIC METABOLIC PANEL  BASIC METABOLIC PANEL  BASIC METABOLIC PANEL  I-STAT ARTERIAL BLOOD GAS, ED    Imaging Review Dg Chest 2 View  10/14/2013   CLINICAL DATA:  Cough.  EXAM: CHEST  2 VIEW  COMPARISON:  05/24/2013 and 03/24/2013  FINDINGS: Heart size and pulmonary vascularity are normal and the  lungs are clear. No effusions. Moderate hiatal hernia. No acute  osseous abnormality.  IMPRESSION: No acute abnormality of the chest.   Electronically Signed   By: Geanie CooleyJim  Maxwell M.D.   On: 10/14/2013 19:47   Dg Pelvis 1-2 Views  10/14/2013   CLINICAL DATA:  Patient not able to use legs normally  EXAM: PELVIS - 1-2 VIEW  COMPARISON:  07/13/2012  FINDINGS: No fracture. No bone lesion. Mild arthropathic changes involves both hips. The SI joints and symphysis pubis are normally aligned. The bones are diffusely demineralized. Soft tissues demonstrate femoral and iliac vascular calcifications and suture material in the left lower quadrant.  IMPRESSION: 1. No fracture or bone lesion.  No acute finding.   Electronically Signed   By: Amie Portlandavid  Ormond M.D.   On: 10/14/2013 19:38   Ct Head Wo Contrast  10/14/2013   CLINICAL DATA:  Fall.  EXAM: CT HEAD WITHOUT CONTRAST  CT CERVICAL SPINE WITHOUT CONTRAST  TECHNIQUE: Multidetector CT imaging of the head and cervical spine was performed following the standard protocol without intravenous contrast. Multiplanar CT image reconstructions of the cervical spine were also generated.  COMPARISON:  MRI 06/09/2012  FINDINGS: CT HEAD FINDINGS  Generalized atrophy. Chronic cerebellar infarcts bilaterally. Chronic left occipital infarct. Chronic microvascular ischemic change in the white matter.  Negative for acute infarct, hemorrhage, or mass lesion. Negative for skull fracture.  CT CERVICAL SPINE FINDINGS  Image quality degraded by mild motion.  Negative for fracture or mass.  Moderate spondylosis at C5-6 and C6-7. This is causing mild spinal stenosis.  IMPRESSION: Atrophy and chronic ischemia.  No acute intracranial abnormality.  Cervical spondylosis.  Negative for fracture.   Electronically Signed   By: Marlan Palauharles  Clark M.D.   On: 10/14/2013 20:52   Ct Cervical Spine Wo Contrast  10/14/2013   CLINICAL DATA:  Fall.  EXAM: CT HEAD WITHOUT CONTRAST  CT CERVICAL SPINE WITHOUT CONTRAST  TECHNIQUE: Multidetector CT imaging of the head and cervical spine  was performed following the standard protocol without intravenous contrast. Multiplanar CT image reconstructions of the cervical spine were also generated.  COMPARISON:  MRI 06/09/2012  FINDINGS: CT HEAD FINDINGS  Generalized atrophy. Chronic cerebellar infarcts bilaterally. Chronic left occipital infarct. Chronic microvascular ischemic change in the white matter.  Negative for acute infarct, hemorrhage, or mass lesion. Negative for skull fracture.  CT CERVICAL SPINE FINDINGS  Image quality degraded by mild motion.  Negative for fracture or mass.  Moderate spondylosis at C5-6 and C6-7. This is causing mild spinal stenosis.  IMPRESSION: Atrophy and chronic ischemia.  No acute intracranial abnormality.  Cervical spondylosis.  Negative for fracture.   Electronically Signed   By: Marlan Palauharles  Clark M.D.   On: 10/14/2013 20:52   Dg Abd 2 Views  10/14/2013   CLINICAL DATA:  Poor appetite.  EXAM: ABDOMEN - 2 VIEW  COMPARISON:  07/13/2012  FINDINGS: Normal bowel gas pattern. No free air. Vascular calcifications are noted along the iliac femoral arteries. Stage mature projects over the left sacral a ala. Soft tissues are otherwise unremarkable. There are degenerative changes of the lower lumbar spine.  IMPRESSION: No acute findings.   Electronically Signed   By: Amie Portlandavid  Ormond M.D.   On: 10/14/2013 19:35     EKG Interpretation None      Date: 10/14/2013  Rate: 113  Rhythm: sinus tachycardia  QRS Axis: normal  Intervals: QT prolonged  ST/T Wave abnormalities: normal  Conduction Disutrbances:left bundle branch block  Narrative Interpretation: no significant  change noted  Old EKG Reviewed: unchanged  CRITICAL CARE Performed by: Willys Salvino Mort Sawyers Total critical care time: 30 min Critical care time was exclusive of separately billable procedures and treating other patients. Critical care was necessary to treat or prevent imminent or life-threatening deterioration. Critical care was time spent personally by me  on the following activities: development of treatment plan with patient and/or surrogate as well as nursing, discussions with consultants, evaluation of patient's response to treatment, examination of patient, obtaining history from patient or surrogate, ordering and performing treatments and interventions, ordering and review of laboratory studies, ordering and review of radiographic studies, pulse oximetry and re-evaluation of patient's condition.  MDM   Final diagnoses:  Hyponatremia  Lactic acidosis  DKA (diabetic ketoacidoses)  UTI (lower urinary tract infection)    4:53 PM 80 y.o. female w a hx of CAD, dementia, HTN, CHF, DM, CVA who presents with productive cough and generalized weakness which began to 3 days ago and has been progressively worsening. The family denies any fevers, vomiting. They do note that she had an episode of brown appearing diarrhea in route. The son states that he witnessed her have a mechanical fall while getting into her chair this morning. He believes that she did hit her head. She is alert and oriented x2 here, she does not know the year. Vital signs reviewed and unremarkable. Blood sugar found to be greater than 600. Will start IV fluid and get screening labwork. Also duoneb.   Will start insulin gtt. CC consulted and evaluated the pt. CC recommends admission to hospitalist to stepdown. Pt found to have UTI, will order rocephin.     Junius Argyle, MD 10/15/13 [phone]

## 2013-10-14 NOTE — ED Notes (Signed)
IV attempted x's 2 without success.  IV team paged for same.

## 2013-10-14 NOTE — ED Notes (Signed)
CG-4 reported to Dr. Romeo Apple

## 2013-10-14 NOTE — H&P (Signed)
[facility] History and Physical  Glenda Gonzalez WUJ:[id number] DOB: May 24, 1934 DOA: 10/14/2013  Referring physician: EDP PCP: Billee CashingMCKENZIE, WAYLAND, MD  Specialists:   Chief Complaint:   HPI: Glenda Gonzalez is a 80 y.o. female with a history of CAD, CHF, HTN, CRI, and Dementia who was brought to the ED due to  Critically high blood sugars at home today, and increased confusion.   She lives with her son, and he reports that she suffered a fall in the AM, but before this she had been at her baseline.   In the ED, she was found to have a glucose level of 712, and she was found to be in DKA and to have a UTI, and was referred for medical admission.      Review of Systems: Unable to Obtain from the Patient  Past Medical History  Diagnosis Date  . GERD (gastroesophageal reflux disease)   . Hypertension   . Diverticulitis   . Gout   . Essential and other specified forms of tremor   . Dementia   . CAD (coronary artery disease)   . Anemia, chronic disease   . Left arm pain   . CHF (congestive heart failure)     Diastolic dysfunction  . Pneumonia     child  . Acute renal failure (ARF)     denies  . Seizures     r/t stroke no rx  . Arthritis   . Diabetes mellitus     no rx for 6 months  . Stroke 2014    Past Surgical History  Procedure Laterality Date  . Dilation and curettage of uterus    . Tumor removal      abdomen  . Abdominal hysterectomy    . Cataract extraction w/phaco Left 03/30/2013    Procedure: CATARACT EXTRACTION PHACO AND INTRAOCULAR LENS PLACEMENT (IOC);  Surgeon: Shade FloodGreer Geiger, MD;  [facility];  Service: Ophthalmology;  Laterality: Left;  . Appendectomy       Prior to Admission medications   Medication Sig Start Date End Date Taking? Authorizing Provider  albuterol (PROVENTIL HFA;VENTOLIN HFA) 108 (90 BASE) MCG/ACT inhaler Inhale 2 puffs into the lungs every 6 (six) hours as needed for wheezing or shortness of breath. 05/29/13  Yes Joseph ArtJessica U Vann,  DO  benzonatate (TESSALON) 200 MG capsule Take 1 capsule (200 mg total) by mouth 3 (three) times daily as needed for cough. 05/29/13  Yes Jessica U Vann, DO  bimatoprost (LUMIGAN) 0.01 % SOLN Apply 1 drop to eye 2 (two) times daily.   Yes Historical Provider, MD  diphenhydrAMINE (BENADRYL) 25 mg capsule Take 25 mg by mouth at bedtime as needed for itching or sleep.    Yes Historical Provider, MD  donepezil (ARICEPT) 10 MG tablet Take 10 mg by mouth at bedtime.   Yes Historical Provider, MD  esomeprazole (NEXIUM) 40 MG capsule Take 40 mg by mouth 2 (two) times daily.   Yes Historical Provider, MD  glipiZIDE (GLUCOTROL) 10 MG tablet Take 1 tablet (10 mg total) by mouth daily before breakfast. 05/29/13  Yes Jessica U Vann, DO  guaiFENesin-dextromethorphan (ROBITUSSIN DM) 100-10 MG/5ML syrup Take 5 mLs by mouth every 4 (four) hours as needed for cough.   Yes Historical Provider, MD  isosorbide-hydrALAZINE (BIDIL) 20-37.5 MG per tablet Take 1 tablet by mouth 2 (two) times daily.   Yes Historical Provider, MD  ketorolac (ACULAR) 0.4 % SOLN Place 1 drop into both eyes 4 (four) times daily.  Yes Historical Provider, MD  nitroGLYCERIN (NITROSTAT) 0.4 MG SL tablet Place 0.4 mg under the tongue every 5 (five) minutes as needed for chest pain.   Yes Historical Provider, MD  Polyethyl Glycol-Propyl Glycol (SYSTANE ULTRA OP) Place 1 drop into both eyes daily as needed. For dry eyes   Yes Historical Provider, MD  prednisoLONE acetate (PRED FORTE) 1 % ophthalmic suspension Place 1 drop into both eyes 4 (four) times daily.   Yes Historical Provider, MD  pregabalin (LYRICA) 50 MG capsule Take 50 mg by mouth 3 (three) times daily.   Yes Historical Provider, MD      Allergies  Allergen Reactions  . Ace Inhibitors Shortness Of Breath and Other (See Comments)    Angioedema   . Codeine Other (See Comments)    hallucination  . Fentanyl Other (See Comments)    Abnormal behavior per husband  . Other Other (See  Comments)    Beef and turkey-causes gout flareup  . Morphine Itching  . Penicillins Itching     Social History:  reports that she quit smoking about 33 years ago. Her smoking use included Cigarettes. She has a 5 pack-year smoking history. She has never used smokeless tobacco. She reports that she does not drink alcohol or use illicit drugs.     Family History  Problem Relation Age of Onset  . Colon cancer Neg Hx   . Heart disease Father   . Heart disease Brother        Physical Exam:  GEN:  Pleasant and confused Elderly Obese 80 y.o. [name] examined and in no acute distress; cooperative with exam Filed Vitals:   10/14/13 2145 10/14/13 2200 10/14/13 2215 10/14/13 2227  BP:  128/105 77/58   Pulse: 109 110 108   Temp:    98.8 F (37.1 C)  TempSrc:    Oral  Resp: 24 29 22    SpO2: 99% 99% 94%    Blood pressure 77/58, pulse 108, temperature 98.8 F (37.1 C), temperature source Oral, resp. rate 22, SpO2 94.00%. PSYCH: She is alert and oriented x1; does not appear anxious does not appear depressed; affect is normal HEENT: Normocephalic and Atraumatic, Mucous membranes pink; PERRLA; EOM intact; Fundi:  Benign;  No scleral icterus, Nares: Patent, Oropharynx: Dry Oral Mucosa, Edentulous, Neck:  FROM, no cervical lymphadenopathy nor thyromegaly or carotid bruit; no JVD; Breasts:: Not examined CHEST WALL: No tenderness CHEST: Normal respiration, clear to auscultation bilaterally HEART: Tachycardic but Regular rate and rhythm; no murmurs rubs or gallops BACK: No kyphosis or scoliosis; no CVA tenderness ABDOMEN: Positive Bowel Sounds, Obese, soft non-tender; no masses, no organomegaly, no pannus; no intertriginous candida. Rectal Exam: Not done EXTREMITIES: No cyanosis, clubbing or edema; no ulcerations. Genitalia: not examined PULSES: 2+ and symmetric SKIN: Normal hydration no rash or ulceration CNS:  Alert X Oriented x 1,   Vascular: pulses palpable throughout     Labs on Admission:  Basic Metabolic Panel:  Recent Labs Lab 10/14/13 1602  NA 123*  K 5.2  CL 83*  CO2 16*  GLUCOSE 712*  BUN 21  CREATININE 1.44*  CALCIUM 9.0   Liver Function Tests:  Recent Labs Lab 10/14/13 1602  AST 30  ALT 25  ALKPHOS 223*  BILITOT 0.4  PROT 8.2  ALBUMIN 3.2*   No results found for this basename: LIPASE, AMYLASE,  in the last 168 hours No results found for this basename: AMMONIA,  in the last 168 hours CBC:  Recent Labs Lab  10/14/13 1602  WBC 10.9*  HGB 8.9*  HCT 29.9*  MCV 75.5*  PLT 219   Cardiac Enzymes:  Recent Labs Lab 10/14/13 1653  TROPONINI <0.30    BNP (last 3 results)  Recent Labs  05/24/13 1850 10/14/13 1653  PROBNP 2226.0* 409.0   CBG:  Recent Labs Lab 10/14/13 1555 10/14/13 1932 10/14/13 2047 10/14/13 2201 10/14/13 2316  GLUCAP >600* 552* 496* 416* 413*    Radiological Exams on Admission: Dg Chest 2 View  10/14/2013   CLINICAL DATA:  Cough.  EXAM: CHEST  2 VIEW  COMPARISON:  05/24/2013 and 03/24/2013  FINDINGS: Heart size and pulmonary vascularity are normal and the lungs are clear. No effusions. Moderate hiatal hernia. No acute osseous abnormality.  IMPRESSION: No acute abnormality of the chest.   Electronically Signed   By: Geanie Cooley M.D.   On: 10/14/2013 19:47   Dg Pelvis 1-2 Views  10/14/2013   CLINICAL DATA:  Patient not able to use legs normally  EXAM: PELVIS - 1-2 VIEW  COMPARISON:  07/13/2012  FINDINGS: No fracture. No bone lesion. Mild arthropathic changes involves both hips. The SI joints and symphysis pubis are normally aligned. The bones are diffusely demineralized. Soft tissues demonstrate femoral and iliac vascular calcifications and suture material in the left lower quadrant.  IMPRESSION: 1. No fracture or bone lesion.  No acute finding.   Electronically Signed   By: Amie Portland M.D.   On: 10/14/2013 19:38   Ct Head Wo Contrast  10/14/2013   CLINICAL DATA:  Fall.  EXAM: CT HEAD  WITHOUT CONTRAST  CT CERVICAL SPINE WITHOUT CONTRAST  TECHNIQUE: Multidetector CT imaging of the head and cervical spine was performed following the standard protocol without intravenous contrast. Multiplanar CT image reconstructions of the cervical spine were also generated.  COMPARISON:  MRI 06/09/2012  FINDINGS: CT HEAD FINDINGS  Generalized atrophy. Chronic cerebellar infarcts bilaterally. Chronic left occipital infarct. Chronic microvascular ischemic change in the white matter.  Negative for acute infarct, hemorrhage, or mass lesion. Negative for skull fracture.  CT CERVICAL SPINE FINDINGS  Image quality degraded by mild motion.  Negative for fracture or mass.  Moderate spondylosis at C5-6 and C6-7. This is causing mild spinal stenosis.  IMPRESSION: Atrophy and chronic ischemia.  No acute intracranial abnormality.  Cervical spondylosis.  Negative for fracture.   Electronically Signed   By: Marlan Palau M.D.   On: 10/14/2013 20:52   Ct Cervical Spine Wo Contrast  10/14/2013   CLINICAL DATA:  Fall.  EXAM: CT HEAD WITHOUT CONTRAST  CT CERVICAL SPINE WITHOUT CONTRAST  TECHNIQUE: Multidetector CT imaging of the head and cervical spine was performed following the standard protocol without intravenous contrast. Multiplanar CT image reconstructions of the cervical spine were also generated.  COMPARISON:  MRI 06/09/2012  FINDINGS: CT HEAD FINDINGS  Generalized atrophy. Chronic cerebellar infarcts bilaterally. Chronic left occipital infarct. Chronic microvascular ischemic change in the white matter.  Negative for acute infarct, hemorrhage, or mass lesion. Negative for skull fracture.  CT CERVICAL SPINE FINDINGS  Image quality degraded by mild motion.  Negative for fracture or mass.  Moderate spondylosis at C5-6 and C6-7. This is causing mild spinal stenosis.  IMPRESSION: Atrophy and chronic ischemia.  No acute intracranial abnormality.  Cervical spondylosis.  Negative for fracture.   Electronically Signed   By:  Marlan Palau M.D.   On: 10/14/2013 20:52   Dg Abd 2 Views  10/14/2013   CLINICAL DATA:  Poor appetite.  EXAM:  ABDOMEN - 2 VIEW  COMPARISON:  07/13/2012  FINDINGS: Normal bowel gas pattern. No free air. Vascular calcifications are noted along the iliac femoral arteries. Stage mature projects over the left sacral a ala. Soft tissues are otherwise unremarkable. There are degenerative changes of the lower lumbar spine.  IMPRESSION: No acute findings.   Electronically Signed   By: Amie Portland M.D.   On: 10/14/2013 19:35       Assessment/Plan:   80 y.o. female with  Principal Problem:   DKA (diabetic ketoacidoses) Active Problems:   Acute encephalopathy   UTI (lower urinary tract infection)   DEMENTIA, MILD   HYPERTENSION   Anemia of chronic disease   CKD (chronic kidney disease), stage III   Diastolic CHF   1.   DKA - placed on the DKA Protocol with IV Insulin,  And IVFs,   Monitor Electrolytes.     2.   Acute Encephalopathy- due to #1,   3.   UTI-   Urine C+S sent, and Placed on IV rocephin, adjust PRN Urine Culture Results.     4.   CKD Stage III with AKI-   Monitor  BUN/Cr. Should improve with IVFs.      5.   Diastolic CHF-  Monitor for S/Sxs of Fluid Overload, BIPAP PRN.      6.   HTN- PRN IV Lopressor fro SBP > 160.     7.   Dementia-   On Aricept Rx.    8.   DVT prophylaxis with Lovenox.       Code Status:   FULL CODE    Family Communication:     Disposition Plan:         Time spent:  60 Minutes  Sianna Garofano Velora Heckler [facility] Pager [phone]  If 7PM-7AM, please contact night-coverage [email or web address] Password [facility] 10/14/2013, 11:19 PM

## 2013-10-14 NOTE — ED Notes (Signed)
ABG reported to Dr. Romeo Apple

## 2013-10-15 ENCOUNTER — Encounter (HOSPITAL_COMMUNITY): Payer: Self-pay

## 2013-10-15 DIAGNOSIS — F03918 Unspecified dementia, unspecified severity, with other behavioral disturbance: Secondary | ICD-10-CM

## 2013-10-15 DIAGNOSIS — I10 Essential (primary) hypertension: Secondary | ICD-10-CM

## 2013-10-15 DIAGNOSIS — F0391 Unspecified dementia with behavioral disturbance: Secondary | ICD-10-CM

## 2013-10-15 DIAGNOSIS — E119 Type 2 diabetes mellitus without complications: Secondary | ICD-10-CM

## 2013-10-15 LAB — BASIC METABOLIC PANEL
BUN: 17 mg/dL (ref 6–23)
BUN: 16 mg/dL (ref 6–23)
BUN: 15 mg/dL (ref 6–23)
BUN: 14 mg/dL (ref 6–23)
BUN: 14 mg/dL (ref 6–23)
CALCIUM: 8.6 mg/dL (ref 8.4–10.5)
CALCIUM: 8.4 mg/dL (ref 8.4–10.5)
CALCIUM: 8.4 mg/dL (ref 8.4–10.5)
CO2: 20 mEq/L (ref 19–32)
CO2: 20 mEq/L (ref 19–32)
CO2: 22 mEq/L (ref 19–32)
CO2: 22 mEq/L (ref 19–32)
CO2: 20 mEq/L (ref 19–32)
CREATININE: 1.19 mg/dL — AB (ref 0.50–1.10)
CREATININE: 1.22 mg/dL — AB (ref 0.50–1.10)
Calcium: 8.6 mg/dL (ref 8.4–10.5)
Calcium: 8.4 mg/dL (ref 8.4–10.5)
Chloride: 102 mEq/L (ref 96–112)
Chloride: 99 mEq/L (ref 96–112)
Chloride: 102 mEq/L (ref 96–112)
Chloride: 100 mEq/L (ref 96–112)
Chloride: 101 mEq/L (ref 96–112)
Creatinine, Ser: 1.3 mg/dL — ABNORMAL HIGH (ref 0.50–1.10)
Creatinine, Ser: 1.19 mg/dL — ABNORMAL HIGH (ref 0.50–1.10)
Creatinine, Ser: 1.13 mg/dL — ABNORMAL HIGH (ref 0.50–1.10)
GFR calc Af Amer: 45 mL/min — ABNORMAL LOW (ref >90)
GFR calc Af Amer: 50 mL/min — ABNORMAL LOW (ref >90)
GFR calc Af Amer: 53 mL/min — ABNORMAL LOW (ref >90)
GFR calc non Af Amer: 43 mL/min — ABNORMAL LOW (ref >90)
GFR, EST AFRICAN AMERICAN: 50 mL/min — AB (ref >90)
GFR, EST AFRICAN AMERICAN: 48 mL/min — AB (ref >90)
GFR, EST NON AFRICAN AMERICAN: 39 mL/min — AB (ref >90)
GFR, EST NON AFRICAN AMERICAN: 43 mL/min — AB (ref >90)
GFR, EST NON AFRICAN AMERICAN: 42 mL/min — AB (ref >90)
GFR, EST NON AFRICAN AMERICAN: 46 mL/min — AB (ref >90)
Glucose, Bld: 163 mg/dL — ABNORMAL HIGH (ref 70–99)
Glucose, Bld: 138 mg/dL — ABNORMAL HIGH (ref 70–99)
Glucose, Bld: 130 mg/dL — ABNORMAL HIGH (ref 70–99)
Glucose, Bld: 171 mg/dL — ABNORMAL HIGH (ref 70–99)
Glucose, Bld: 167 mg/dL — ABNORMAL HIGH (ref 70–99)
POTASSIUM: 4.3 meq/L (ref 3.7–5.3)
Potassium: 3.5 mEq/L — ABNORMAL LOW (ref 3.7–5.3)
Potassium: 4.1 mEq/L (ref 3.7–5.3)
Potassium: 4.3 mEq/L (ref 3.7–5.3)
Potassium: 4.4 mEq/L (ref 3.7–5.3)
SODIUM: 138 meq/L (ref 137–147)
SODIUM: 134 meq/L — AB (ref 137–147)
SODIUM: 135 meq/L — AB (ref 137–147)
Sodium: 134 mEq/L — ABNORMAL LOW (ref 137–147)
Sodium: 137 mEq/L (ref 137–147)

## 2013-10-15 LAB — GLUCOSE, CAPILLARY
GLUCOSE-CAPILLARY: 370 mg/dL — AB (ref 70–99)
GLUCOSE-CAPILLARY: 309 mg/dL — AB (ref 70–99)
GLUCOSE-CAPILLARY: 121 mg/dL — AB (ref 70–99)
GLUCOSE-CAPILLARY: 142 mg/dL — AB (ref 70–99)
GLUCOSE-CAPILLARY: 143 mg/dL — AB (ref 70–99)
GLUCOSE-CAPILLARY: 171 mg/dL — AB (ref 70–99)
GLUCOSE-CAPILLARY: 171 mg/dL — AB (ref 70–99)
Glucose-Capillary: 193 mg/dL — ABNORMAL HIGH (ref 70–99)
Glucose-Capillary: 143 mg/dL — ABNORMAL HIGH (ref 70–99)
Glucose-Capillary: 121 mg/dL — ABNORMAL HIGH (ref 70–99)
Glucose-Capillary: 137 mg/dL — ABNORMAL HIGH (ref 70–99)
Glucose-Capillary: 156 mg/dL — ABNORMAL HIGH (ref 70–99)
Glucose-Capillary: 140 mg/dL — ABNORMAL HIGH (ref 70–99)
Glucose-Capillary: 131 mg/dL — ABNORMAL HIGH (ref 70–99)
Glucose-Capillary: 131 mg/dL — ABNORMAL HIGH (ref 70–99)
Glucose-Capillary: 152 mg/dL — ABNORMAL HIGH (ref 70–99)
Glucose-Capillary: 150 mg/dL — ABNORMAL HIGH (ref 70–99)
Glucose-Capillary: 159 mg/dL — ABNORMAL HIGH (ref 70–99)
Glucose-Capillary: 164 mg/dL — ABNORMAL HIGH (ref 70–99)
Glucose-Capillary: 159 mg/dL — ABNORMAL HIGH (ref 70–99)
Glucose-Capillary: 168 mg/dL — ABNORMAL HIGH (ref 70–99)
Glucose-Capillary: 213 mg/dL — ABNORMAL HIGH (ref 70–99)

## 2013-10-15 LAB — CBC
HEMATOCRIT: 28.6 % — AB (ref 36.0–46.0)
HEMOGLOBIN: 8.8 g/dL — AB (ref 12.0–15.0)
MCH: 23 pg — AB (ref 26.0–34.0)
MCHC: 30.8 g/dL (ref 30.0–36.0)
MCV: 74.9 fL — ABNORMAL LOW (ref 78.0–100.0)
Platelets: 192 10*3/uL (ref 150–400)
RBC: 3.82 MIL/uL — ABNORMAL LOW (ref 3.87–5.11)
RDW: 21.2 % — AB (ref 11.5–15.5)
WBC: 8.2 10*3/uL (ref 4.0–10.5)

## 2013-10-15 LAB — CBG MONITORING, ED: GLUCOSE-CAPILLARY: 394 mg/dL — AB (ref 70–99)

## 2013-10-15 LAB — MRSA PCR SCREENING: MRSA by PCR: NEGATIVE

## 2013-10-15 MED ORDER — DONEPEZIL HCL 10 MG PO TABS
10.0000 mg | ORAL_TABLET | Freq: Every day | ORAL | Status: DC
  Administered 2013-10-15 – 2013-10-18 (×4): 10 mg via ORAL
  Filled 2013-10-15 (×6): qty 1

## 2013-10-15 MED ORDER — SODIUM CHLORIDE 0.9 % IV SOLN: INTRAVENOUS | Status: AC

## 2013-10-15 MED ORDER — INSULIN ASPART 100 UNIT/ML ~~LOC~~ SOLN
0.0000 [IU] | Freq: Three times a day (TID) | SUBCUTANEOUS | Status: DC
  Administered 2013-10-16: 3 [IU] via SUBCUTANEOUS
  Administered 2013-10-16: 5 [IU] via SUBCUTANEOUS

## 2013-10-15 MED ORDER — PREDNISOLONE ACETATE 1 % OP SUSP
1.0000 [drp] | Freq: Four times a day (QID) | OPHTHALMIC | Status: DC
  Administered 2013-10-15 – 2013-10-19 (×15): 1 [drp] via OPHTHALMIC
  Filled 2013-10-15: qty 1
  Filled 2013-10-15: qty 5

## 2013-10-15 MED ORDER — NITROGLYCERIN 0.4 MG SL SUBL: 0.4000 mg | SUBLINGUAL_TABLET | SUBLINGUAL | Status: DC | PRN

## 2013-10-15 MED ORDER — INSULIN GLARGINE 100 UNIT/ML ~~LOC~~ SOLN
5.0000 [IU] | Freq: Every day | SUBCUTANEOUS | Status: DC
  Administered 2013-10-16: 5 [IU] via SUBCUTANEOUS
  Filled 2013-10-15 (×2): qty 0.05

## 2013-10-15 MED ORDER — DEXTROSE 50 % IV SOLN: 25.0000 mL | INTRAVENOUS | Status: DC | PRN

## 2013-10-15 MED ORDER — POTASSIUM CHLORIDE 10 MEQ/100ML IV SOLN
10.0000 meq | INTRAVENOUS | Status: AC
  Administered 2013-10-15 (×2): 10 meq via INTRAVENOUS
  Filled 2013-10-15 (×2): qty 100

## 2013-10-15 MED ORDER — INSULIN GLARGINE 100 UNIT/ML ~~LOC~~ SOLN
10.0000 [IU] | Freq: Every day | SUBCUTANEOUS | Status: DC
  Administered 2013-10-15: 10 [IU] via SUBCUTANEOUS
  Filled 2013-10-15: qty 0.1

## 2013-10-15 MED ORDER — SODIUM CHLORIDE 0.9 % IV SOLN
INTRAVENOUS | Status: DC
  Administered 2013-10-15: 125 mL via INTRAVENOUS
  Administered 2013-10-15: 23:00:00 via INTRAVENOUS
  Administered 2013-10-17: 75 mL via INTRAVENOUS

## 2013-10-15 MED ORDER — METOPROLOL TARTRATE 12.5 MG HALF TABLET
12.5000 mg | ORAL_TABLET | Freq: Two times a day (BID) | ORAL | Status: DC
  Administered 2013-10-15 – 2013-10-16 (×2): 12.5 mg via ORAL
  Filled 2013-10-15 (×3): qty 1

## 2013-10-15 MED ORDER — ALBUTEROL SULFATE (2.5 MG/3ML) 0.083% IN NEBU: 2.5000 mg | INHALATION_SOLUTION | Freq: Four times a day (QID) | RESPIRATORY_TRACT | Status: DC | PRN

## 2013-10-15 MED ORDER — DEXTROSE-NACL 5-0.45 % IV SOLN
INTRAVENOUS | Status: DC
  Administered 2013-10-15: 75 mL via INTRAVENOUS

## 2013-10-15 MED ORDER — SODIUM CHLORIDE 0.9 % IV SOLN
INTRAVENOUS | Status: DC
  Filled 2013-10-15: qty 1

## 2013-10-15 MED ORDER — ENOXAPARIN SODIUM 30 MG/0.3ML ~~LOC~~ SOLN
30.0000 mg | SUBCUTANEOUS | Status: DC
  Administered 2013-10-15 – 2013-10-17 (×3): 30 mg via SUBCUTANEOUS
  Filled 2013-10-15 (×4): qty 0.3

## 2013-10-15 MED ORDER — INSULIN ASPART 100 UNIT/ML ~~LOC~~ SOLN
0.0000 [IU] | Freq: Every day | SUBCUTANEOUS | Status: DC
  Administered 2013-10-15: 2 [IU] via SUBCUTANEOUS

## 2013-10-15 MED ORDER — METHYLCELLULOSE 1 % OP SOLN
1.0000 [drp] | Freq: Four times a day (QID) | OPHTHALMIC | Status: DC | PRN
  Filled 2013-10-15: qty 1

## 2013-10-15 MED ORDER — KETOROLAC TROMETHAMINE 0.5 % OP SOLN
1.0000 [drp] | Freq: Four times a day (QID) | OPHTHALMIC | Status: DC
  Administered 2013-10-15 – 2013-10-19 (×16): 1 [drp] via OPHTHALMIC
  Filled 2013-10-15 (×2): qty 3

## 2013-10-15 MED ORDER — KETOROLAC TROMETHAMINE 0.4 % OP SOLN
1.0000 [drp] | Freq: Four times a day (QID) | OPHTHALMIC | Status: DC
  Filled 2013-10-15: qty 5

## 2013-10-15 NOTE — Progress Notes (Signed)
Md notified pt's potassium 3.5.  New orders received. Will continue to monitor. Glenda Gonzalez

## 2013-10-15 NOTE — Progress Notes (Signed)
Glenda Gonzalez [id number] DOB: August 08, 1933 DOA: 10/14/2013 PCP: Billee Cashing, MD  Admit HPI / Brief Narrative: Glenda Gonzalez is a 80 y.o.  PMHx female with a history of CAD, Diastolic CHF, HTN, CRI, Dementia, Seizures who was brought to the ED due to Critically high blood sugars at home today, and increased confusion. She lives with her son, and he reports that she suffered a fall in the AM, but before this she had been at her baseline. In the ED, she was found to have a glucose level of 712, and she was found to be in DKA and to have a UTI, and was referred for medical admission.    HPI/Subjective: 5/23 A./O. x4, request to know when she would be allowed to eat and drink  Assessment/Plan: DKA -Resolved -Lantus 5 units 2 hours prior to stopping glucose stabilizer -Obtain hemoglobin A1c -Obtain lipid panel   Acute Encephalopathy- due to #1,  -Resolved  UTI -Continue ceftriaxone - adjust PRN Urine Culture Results.   CKD Stage III with AKI -Improving with IVFs.   Diastolic CHF - Monitor for S/Sxs of Fluid Overload -Metoprolol 12.5 mg BID - BIPAP PRN.   HTN -See CHF  Dementia - Continue Aricept 10 mg daily       Code Status: FULL Family Communication: no family present at time of exam Disposition Plan: Resolution of DKA    Consultants: And  Procedure/Significant Events:    Culture Urine pending   Antibiotics: Ceftriaxone 5/22>>   DVT prophylaxis: Lovenox   Devices NA   LINES / TUBES:  5/22   24  Ga left hand 5/23  24 Ga left forearm     Continuous Infusions: . sodium chloride Stopped (10/15/13 0252)  . dextrose 5 % and 0.45% NaCl    . dextrose 5 % and 0.45% NaCl 75 mL (10/15/13 0252)  . insulin (NOVOLIN-R) infusion 10 Units/hr (10/15/13 0054)  . insulin (NOVOLIN-R) infusion 1.1 Units/hr (10/15/13 1713)    Objective: VITAL SIGNS: Temp: 98.8 F (37.1 C) (05/23  1551) Temp src: Oral (05/23 1551) BP: 164/59 mmHg (05/23 1224) Pulse Rate: 86 (05/23 1224) SPO2; FIO2:   Intake/Output Summary (Last 24 hours) at 10/15/13 1721 Last data filed at 10/15/13 1600  Gross per 24 hour  Intake 2387.73 ml  Output    650 ml  Net 1737.73 ml     Exam: General: A./O. x4, NAD, No acute respiratory distress Lungs: Clear to auscultation bilaterally without wheezes or crackles Cardiovascular: Regular rate and rhythm without murmur gallop or rub normal S1 and S2 Abdomen: Nontender, nondistended, soft, bowel sounds positive, no rebound, no ascites, no appreciable mass Extremities: No significant cyanosis, clubbing, or edema bilateral lower extremities  Data Reviewed: Basic Metabolic Panel:  Recent Labs Lab 10/14/13 1602 10/15/13 0302 10/15/13 0708 10/15/13 1115 10/15/13 1635  NA 123* 138 134* 137 134*  K 5.2 3.5* 4.1 4.3 4.4  CL 83* 102 99 102 100  CO2 16* 20 20 22 22   GLUCOSE 712* 163* 138* 130* 171*  BUN 21 17 16 15 14   CREATININE 1.44* 1.30* 1.19* 1.22* 1.19*  CALCIUM 9.0 8.6 8.6 8.4 8.4   Liver Function Tests:  Recent Labs Lab 10/14/13 1602  AST 30  ALT 25  ALKPHOS 223*  BILITOT 0.4  PROT 8.2  ALBUMIN 3.2*   No results found for this basename: LIPASE, AMYLASE,  in the last 168 hours No results found for this basename:  AMMONIA,  in the last 168 hours CBC:  Recent Labs Lab 10/14/13 1602 10/15/13 0302  WBC 10.9* 8.2  HGB 8.9* 8.8*  HCT 29.9* 28.6*  MCV 75.5* 74.9*  PLT 219 192   Cardiac Enzymes:  Recent Labs Lab 10/14/13 1653  TROPONINI <0.30   BNP (last 3 results)  Recent Labs  05/24/13 1850 10/14/13 1653  PROBNP 2226.0* 409.0   CBG:  Recent Labs Lab 10/15/13 0748 10/15/13 0845 10/15/13 0945 10/15/13 1050 10/15/13 1345  GLUCAP 156* 142* 143* 140* 152*    Recent Results (from the past 240 hour(s))  MRSA PCR SCREENING     Status: None   Collection Time    10/15/13 12:51 AM      Result Value Ref Range  Status   MRSA by PCR NEGATIVE  NEGATIVE Final   Comment:            The GeneXpert MRSA Assay (FDA     approved for NASAL specimens     only), is one component of a     comprehensive MRSA colonization     surveillance program. It is not     intended to diagnose MRSA     infection nor to guide or     monitor treatment for     MRSA infections.     Studies:  Recent x-ray studies have been reviewed in detail by the Attending Physician  Scheduled Meds:  Scheduled Meds: . cefTRIAXone (ROCEPHIN)  IV  1 g Intravenous Q24H  . enoxaparin (LOVENOX) injection  30 mg Subcutaneous Q24H  . ketorolac  1 drop Both Eyes QID  . prednisoLONE acetate  1 drop Both Eyes QID    Time spent on care of this patient: 40 mins   Drema Dallasurtis J Yannely Kintzel , MD   [facility] Office  [phone] Pager [phone]- [phone]  On-Call/Text Page:      [email or web address]      password [email or web address]  If 7PM-7AM, please contact night-coverage [email or web address] Password [email or web address] 10/15/2013, 5:21 PM   LOS: 1 day

## 2013-10-16 DIAGNOSIS — D638 Anemia in other chronic diseases classified elsewhere: Secondary | ICD-10-CM

## 2013-10-16 DIAGNOSIS — I447 Left bundle-branch block, unspecified: Secondary | ICD-10-CM

## 2013-10-16 DIAGNOSIS — J9600 Acute respiratory failure, unspecified whether with hypoxia or hypercapnia: Secondary | ICD-10-CM

## 2013-10-16 DIAGNOSIS — E782 Mixed hyperlipidemia: Secondary | ICD-10-CM | POA: Diagnosis present

## 2013-10-16 LAB — CBC WITH DIFFERENTIAL/PLATELET
Basophils Absolute: 0.1 10*3/uL (ref 0.0–0.1)
Basophils Relative: 1 % (ref 0–1)
EOS PCT: 2 % (ref 0–5)
Eosinophils Absolute: 0.2 10*3/uL (ref 0.0–0.7)
HEMATOCRIT: 28.1 % — AB (ref 36.0–46.0)
Hemoglobin: 8.5 g/dL — ABNORMAL LOW (ref 12.0–15.0)
LYMPHS ABS: 1.9 10*3/uL (ref 0.7–4.0)
Lymphocytes Relative: 25 % (ref 12–46)
MCH: 22.8 pg — ABNORMAL LOW (ref 26.0–34.0)
MCHC: 30.2 g/dL (ref 30.0–36.0)
MCV: 75.3 fL — AB (ref 78.0–100.0)
MONOS PCT: 9 % (ref 3–12)
Monocytes Absolute: 0.7 10*3/uL (ref 0.1–1.0)
NEUTROS ABS: 4.7 10*3/uL (ref 1.7–7.7)
Neutrophils Relative %: 63 % (ref 43–77)
Platelets: 180 10*3/uL (ref 150–400)
RBC: 3.73 MIL/uL — AB (ref 3.87–5.11)
RDW: 21.7 % — AB (ref 11.5–15.5)
WBC: 7.6 10*3/uL (ref 4.0–10.5)

## 2013-10-16 LAB — BASIC METABOLIC PANEL
BUN: 14 mg/dL (ref 6–23)
BUN: 16 mg/dL (ref 6–23)
BUN: 16 mg/dL (ref 6–23)
BUN: 17 mg/dL (ref 6–23)
BUN: 16 mg/dL (ref 6–23)
CALCIUM: 8.1 mg/dL — AB (ref 8.4–10.5)
CALCIUM: 8.5 mg/dL (ref 8.4–10.5)
CHLORIDE: 100 meq/L (ref 96–112)
CHLORIDE: 100 meq/L (ref 96–112)
CO2: 17 meq/L — AB (ref 19–32)
CO2: 18 mEq/L — ABNORMAL LOW (ref 19–32)
CO2: 19 meq/L (ref 19–32)
CO2: 20 mEq/L (ref 19–32)
CO2: 20 meq/L (ref 19–32)
CREATININE: 1.11 mg/dL — AB (ref 0.50–1.10)
CREATININE: 1.14 mg/dL — AB (ref 0.50–1.10)
Calcium: 8.4 mg/dL (ref 8.4–10.5)
Calcium: 8.2 mg/dL — ABNORMAL LOW (ref 8.4–10.5)
Calcium: 8.1 mg/dL — ABNORMAL LOW (ref 8.4–10.5)
Chloride: 98 mEq/L (ref 96–112)
Chloride: 102 mEq/L (ref 96–112)
Chloride: 99 mEq/L (ref 96–112)
Creatinine, Ser: 1.07 mg/dL (ref 0.50–1.10)
Creatinine, Ser: 1.18 mg/dL — ABNORMAL HIGH (ref 0.50–1.10)
Creatinine, Ser: 1.14 mg/dL — ABNORMAL HIGH (ref 0.50–1.10)
GFR calc Af Amer: 57 mL/min — ABNORMAL LOW (ref >90)
GFR calc Af Amer: 50 mL/min — ABNORMAL LOW (ref >90)
GFR calc Af Amer: 52 mL/min — ABNORMAL LOW (ref >90)
GFR calc non Af Amer: 47 mL/min — ABNORMAL LOW (ref >90)
GFR calc non Af Amer: 49 mL/min — ABNORMAL LOW (ref >90)
GFR calc non Af Amer: 45 mL/min — ABNORMAL LOW (ref >90)
GFR calc non Af Amer: 45 mL/min — ABNORMAL LOW (ref >90)
GFR, EST AFRICAN AMERICAN: 52 mL/min — AB (ref >90)
GFR, EST AFRICAN AMERICAN: 54 mL/min — AB (ref >90)
GFR, EST NON AFRICAN AMERICAN: 43 mL/min — AB (ref >90)
GLUCOSE: 214 mg/dL — AB (ref 70–99)
GLUCOSE: 225 mg/dL — AB (ref 70–99)
GLUCOSE: 226 mg/dL — AB (ref 70–99)
Glucose, Bld: 240 mg/dL — ABNORMAL HIGH (ref 70–99)
Glucose, Bld: 260 mg/dL — ABNORMAL HIGH (ref 70–99)
POTASSIUM: 4.2 meq/L (ref 3.7–5.3)
POTASSIUM: 4.2 meq/L (ref 3.7–5.3)
POTASSIUM: 4.2 meq/L (ref 3.7–5.3)
POTASSIUM: 4.4 meq/L (ref 3.7–5.3)
Potassium: 5.5 mEq/L — ABNORMAL HIGH (ref 3.7–5.3)
SODIUM: 132 meq/L — AB (ref 137–147)
Sodium: 130 mEq/L — ABNORMAL LOW (ref 137–147)
Sodium: 134 mEq/L — ABNORMAL LOW (ref 137–147)
Sodium: 132 mEq/L — ABNORMAL LOW (ref 137–147)
Sodium: 133 mEq/L — ABNORMAL LOW (ref 137–147)

## 2013-10-16 LAB — COMPREHENSIVE METABOLIC PANEL
ALBUMIN: 2.7 g/dL — AB (ref 3.5–5.2)
ALK PHOS: 166 U/L — AB (ref 39–117)
ALT: 20 U/L (ref 0–35)
AST: 33 U/L (ref 0–37)
BUN: 15 mg/dL (ref 6–23)
CO2: 22 mEq/L (ref 19–32)
Calcium: 8.4 mg/dL (ref 8.4–10.5)
Chloride: 99 mEq/L (ref 96–112)
Creatinine, Ser: 1.14 mg/dL — ABNORMAL HIGH (ref 0.50–1.10)
GFR calc Af Amer: 52 mL/min — ABNORMAL LOW (ref >90)
GFR calc non Af Amer: 45 mL/min — ABNORMAL LOW (ref >90)
Glucose, Bld: 221 mg/dL — ABNORMAL HIGH (ref 70–99)
POTASSIUM: 4.2 meq/L (ref 3.7–5.3)
SODIUM: 134 meq/L — AB (ref 137–147)
TOTAL PROTEIN: 7 g/dL (ref 6.0–8.3)
Total Bilirubin: <0.2 mg/dL — ABNORMAL LOW (ref 0.3–1.2)

## 2013-10-16 LAB — GLUCOSE, CAPILLARY
Glucose-Capillary: 226 mg/dL — ABNORMAL HIGH (ref 70–99)
Glucose-Capillary: 264 mg/dL — ABNORMAL HIGH (ref 70–99)
Glucose-Capillary: 224 mg/dL — ABNORMAL HIGH (ref 70–99)
Glucose-Capillary: 272 mg/dL — ABNORMAL HIGH (ref 70–99)
Glucose-Capillary: 127 mg/dL — ABNORMAL HIGH (ref 70–99)

## 2013-10-16 LAB — HEMOGLOBIN A1C
Hgb A1c MFr Bld: 13.4 % — ABNORMAL HIGH (ref <5.7)
Mean Plasma Glucose: 338 mg/dL — ABNORMAL HIGH (ref <117)

## 2013-10-16 LAB — LIPID PANEL
CHOL/HDL RATIO: 4.8 ratio
CHOLESTEROL: 195 mg/dL (ref 0–200)
HDL: 41 mg/dL (ref >39)
LDL Cholesterol: 95 mg/dL (ref 0–99)
Triglycerides: 293 mg/dL — ABNORMAL HIGH (ref <150)
VLDL: 59 mg/dL — ABNORMAL HIGH (ref 0–40)

## 2013-10-16 LAB — MAGNESIUM: Magnesium: 1.6 mg/dL (ref 1.5–2.5)

## 2013-10-16 MED ORDER — ONDANSETRON HCL 4 MG/2ML IJ SOLN
4.0000 mg | Freq: Four times a day (QID) | INTRAMUSCULAR | Status: DC | PRN
  Administered 2013-10-16 – 2013-10-19 (×5): 4 mg via INTRAVENOUS
  Filled 2013-10-16 (×5): qty 2

## 2013-10-16 MED ORDER — ISOSORBIDE MONONITRATE ER 30 MG PO TB24
30.0000 mg | ORAL_TABLET | Freq: Every day | ORAL | Status: DC
  Administered 2013-10-16 – 2013-10-18 (×3): 30 mg via ORAL
  Filled 2013-10-16 (×3): qty 1

## 2013-10-16 MED ORDER — ACETAMINOPHEN 325 MG PO TABS
650.0000 mg | ORAL_TABLET | Freq: Four times a day (QID) | ORAL | Status: DC | PRN
  Administered 2013-10-16: 650 mg via ORAL
  Filled 2013-10-16: qty 2

## 2013-10-16 MED ORDER — INSULIN ASPART 100 UNIT/ML ~~LOC~~ SOLN
0.0000 [IU] | SUBCUTANEOUS | Status: DC
  Administered 2013-10-16: 5 [IU] via SUBCUTANEOUS
  Administered 2013-10-16: 8 [IU] via SUBCUTANEOUS
  Administered 2013-10-16 – 2013-10-17 (×2): 2 [IU] via SUBCUTANEOUS
  Administered 2013-10-17 (×2): 3 [IU] via SUBCUTANEOUS

## 2013-10-16 MED ORDER — INSULIN GLARGINE 100 UNIT/ML ~~LOC~~ SOLN
5.0000 [IU] | Freq: Once | SUBCUTANEOUS | Status: AC
  Administered 2013-10-16: 5 [IU] via SUBCUTANEOUS
  Filled 2013-10-16: qty 0.05

## 2013-10-16 MED ORDER — METOPROLOL TARTRATE 25 MG PO TABS
25.0000 mg | ORAL_TABLET | Freq: Two times a day (BID) | ORAL | Status: DC
  Administered 2013-10-16 – 2013-10-19 (×6): 25 mg via ORAL
  Filled 2013-10-16 (×8): qty 1

## 2013-10-16 MED ORDER — ASPIRIN EC 81 MG PO TBEC
81.0000 mg | DELAYED_RELEASE_TABLET | Freq: Every day | ORAL | Status: DC
  Administered 2013-10-16 – 2013-10-18 (×3): 81 mg via ORAL
  Filled 2013-10-16 (×5): qty 1

## 2013-10-16 MED ORDER — NIACIN 500 MG PO TABS
500.0000 mg | ORAL_TABLET | Freq: Every day | ORAL | Status: DC
  Administered 2013-10-16 – 2013-10-18 (×3): 500 mg via ORAL
  Filled 2013-10-16 (×5): qty 1

## 2013-10-16 MED ORDER — ASPIRIN EC 81 MG PO TBEC: 81.0000 mg | DELAYED_RELEASE_TABLET | Freq: Every day | ORAL | Status: DC

## 2013-10-16 MED ORDER — METOPROLOL TARTRATE 12.5 MG HALF TABLET
12.5000 mg | ORAL_TABLET | Freq: Once | ORAL | Status: AC
  Administered 2013-10-16: 12.5 mg via ORAL
  Filled 2013-10-16: qty 1

## 2013-10-16 MED ORDER — INSULIN GLARGINE 100 UNIT/ML ~~LOC~~ SOLN
10.0000 [IU] | Freq: Every day | SUBCUTANEOUS | Status: DC
  Administered 2013-10-17: 10 [IU] via SUBCUTANEOUS
  Filled 2013-10-16 (×2): qty 0.1

## 2013-10-16 NOTE — Progress Notes (Signed)
[place] TEAM 1 - Stepdown/ICU TEAM Progress Note  Glenda Gonzalez AFB:[id number] DOB: Apr 22, 1934 DOA: 10/14/2013 PCP: Billee Cashing, MD  Admit HPI / Brief Narrative: Glenda Gonzalez is a 80 y.o.  PMHx female with a history of CAD, Diastolic CHF, HTN, CRI, Dementia, Seizures who was brought to the ED due to Critically high blood sugars at home today, and increased confusion. She lives with her son, and he reports that she suffered a fall in the AM, but before this she had been at her baseline. In the ED, she was found to have a glucose level of 712, and she was found to be in DKA and to have a UTI, and was referred for medical admission.    HPI/Subjective: 5/23 A./O. x4, request to know when she would be allowed to eat and drink  Assessment/Plan: DKA -Resolved -5/24 hemoglobin A1c= 13.4 -lipid panel; LDL/HDL within ADA guidelines however TG elevated  -5/24 Lantus 5 units x1 now -Increase SSI to moderate -Increase Lantus to 10 units daily   Triglyceridemia -Start niacin 500 mg QHS; Ensure aspirin 81 mg administered 30 minutes prior to niacin   Acute Encephalopathy- due to #1,  -Resolved  UTI -Continue ceftriaxone - adjust PRN Urine Culture Results.   CKD Stage III with AKI -Improving with IVFs.   Diastolic CHF - Monitor for S/Sxs of Fluid Overload -Increase Metoprolol 25 mg BID -Start Imdur 30 mg daily - BIPAP PRN.   HTN -See CHF  Dementia - Continue Aricept 10 mg daily       Code Status: FULL Family Communication: no family present at time of exam Disposition Plan: Resolution of DKA    Consultants: And  Procedure/Significant Events:    Culture 5/22 positive Urine Escherichia coli (sensitivity pending)   Antibiotics: Ceftriaxone 5/22>>   DVT prophylaxis: Lovenox   Devices NA   LINES / TUBES:  5/22   24  Ga left hand 5/23  24 Ga left forearm     Continuous Infusions: . sodium chloride 75 mL/hr at 10/16/13 0600     Objective: VITAL SIGNS: Temp: 97.7 F (36.5 C) (05/24 0805) Temp src: Oral (05/24 0805) BP: 155/78 mmHg (05/24 0805) Pulse Rate: 76 (05/24 0805) SPO2; FIO2:   Intake/Output Summary (Last 24 hours) at 10/16/13 0929 Last data filed at 10/16/13 0800  Gross per 24 hour  Intake 1489.75 ml  Output   1601 ml  Net -111.25 ml     Exam: General: A./O. x4, NAD, No acute respiratory distress Lungs: Clear to auscultation bilaterally without wheezes or crackles Cardiovascular: Regular rate and rhythm without murmur gallop or rub normal S1 and S2 Abdomen: Nontender, nondistended, soft, bowel sounds positive, no rebound, no ascites, no appreciable mass Extremities: No significant cyanosis, clubbing, or edema bilateral lower extremities  Data Reviewed: Basic Metabolic Panel:  Recent Labs Lab 10/15/13 1635 10/15/13 1850 10/16/13 0049 10/16/13 0330 10/16/13 0805  NA 134* 135* 130* 134* 134*  K 4.4 4.3 4.2 4.2 5.5*  CL 100 101 98 99 102  CO2 22 20 18* 22 17*  GLUCOSE 171* 167* 240* 221* 214*  BUN 14 14 16 15 14   CREATININE 1.19* 1.13* 1.11* 1.14* 1.07  CALCIUM 8.4 8.4 8.1* 8.4 8.5  MG  --   --   --  1.6  --    Liver Function Tests:  Recent Labs Lab 10/14/13 1602 10/16/13 0330  AST 30 33  ALT 25 20  ALKPHOS 223* 166*  BILITOT 0.4 <0.2*  PROT 8.2  7.0  ALBUMIN 3.2* 2.7*   No results found for this basename: LIPASE, AMYLASE,  in the last 168 hours No results found for this basename: AMMONIA,  in the last 168 hours CBC:  Recent Labs Lab 10/14/13 1602 10/15/13 0302 10/16/13 0330  WBC 10.9* 8.2 7.6  NEUTROABS  --   --  4.7  HGB 8.9* 8.8* 8.5*  HCT 29.9* 28.6* 28.1*  MCV 75.5* 74.9* 75.3*  PLT 219 192 180   Cardiac Enzymes:  Recent Labs Lab 10/14/13 1653  TROPONINI <0.30   BNP (last 3 results)  Recent Labs  05/24/13 1850 10/14/13 1653  PROBNP 2226.0* 409.0   CBG:  Recent Labs Lab 10/15/13 1918 10/15/13 2017 10/15/13 2119 10/15/13 2244  10/16/13 0811  GLUCAP 164* 159* 168* 213* 226*    Recent Results (from the past 240 hour(s))  URINE CULTURE     Status: None   Collection Time    10/14/13  8:53 PM      Result Value Ref Range Status   Specimen Description URINE, CATHETERIZED   Final   Special Requests NONE   Final   Culture  Setup Time     Final   Value: 10/14/2013 21:09     [facility]formed at [facility]   Colony Count     Final   Value: >=100,000 COLONIES/ML     [facility]formed at [facility]   Culture     Final   Value: ESCHERICHIA COLI     [facility]formed at [facility]   Report Status PENDING   Incomplete  MRSA PCR SCREENING     Status: None   Collection Time    10/15/13 12:51 AM      Result Value Ref Range Status   MRSA by PCR NEGATIVE  NEGATIVE Final   Comment:            The GeneXpert MRSA Assay (FDA     approved for NASAL specimens     only), is one component of a     comprehensive MRSA colonization     surveillance program. It is not     intended to diagnose MRSA     infection nor to guide or     monitor treatment for     MRSA infections.     Studies:  Recent x-ray studies have been reviewed in detail by the Attending Physician  Scheduled Meds:  Scheduled Meds: . cefTRIAXone (ROCEPHIN)  IV  1 g Intravenous Q24H  . donepezil  10 mg Oral QHS  . enoxaparin (LOVENOX) injection  30 mg Subcutaneous Q24H  . insulin aspart  0-5 Units Subcutaneous QHS  . insulin aspart  0-9 Units Subcutaneous TID WC  . insulin glargine  5 Units Subcutaneous Daily  . ketorolac  1 drop Both Eyes QID  . metoprolol tartrate  12.5 mg Oral BID  . prednisoLONE acetate  1 drop Both Eyes QID    Time spent on care of this patient: 40 mins   Drema Dallasurtis J Raeshaun Simson , MD   [facility] Office  [phone] Pager [phone]- [phone]  On-Call/Text Page:      [email or web address]      password [email or web address]  If 7PM-7AM, please contact night-coverage [email or web address] Password [email or web address] 10/16/2013, 9:29 AM   LOS: 2 days

## 2013-10-17 LAB — COMPREHENSIVE METABOLIC PANEL
ALK PHOS: 178 U/L — AB (ref 39–117)
ALT: 29 U/L (ref 0–35)
AST: 59 U/L — ABNORMAL HIGH (ref 0–37)
Albumin: 2.7 g/dL — ABNORMAL LOW (ref 3.5–5.2)
BUN: 15 mg/dL (ref 6–23)
CHLORIDE: 102 meq/L (ref 96–112)
CO2: 19 meq/L (ref 19–32)
CREATININE: 1.11 mg/dL — AB (ref 0.50–1.10)
Calcium: 8.5 mg/dL (ref 8.4–10.5)
GFR calc Af Amer: 54 mL/min — ABNORMAL LOW (ref >90)
GFR, EST NON AFRICAN AMERICAN: 47 mL/min — AB (ref >90)
Glucose, Bld: 194 mg/dL — ABNORMAL HIGH (ref 70–99)
POTASSIUM: 4.6 meq/L (ref 3.7–5.3)
Sodium: 135 mEq/L — ABNORMAL LOW (ref 137–147)
Total Bilirubin: 0.2 mg/dL — ABNORMAL LOW (ref 0.3–1.2)
Total Protein: 7.1 g/dL (ref 6.0–8.3)

## 2013-10-17 LAB — URINE CULTURE

## 2013-10-17 LAB — GLUCOSE, CAPILLARY
GLUCOSE-CAPILLARY: 177 mg/dL — AB (ref 70–99)
GLUCOSE-CAPILLARY: 180 mg/dL — AB (ref 70–99)
GLUCOSE-CAPILLARY: 214 mg/dL — AB (ref 70–99)
Glucose-Capillary: 131 mg/dL — ABNORMAL HIGH (ref 70–99)
Glucose-Capillary: 210 mg/dL — ABNORMAL HIGH (ref 70–99)

## 2013-10-17 LAB — CBC WITH DIFFERENTIAL/PLATELET
Basophils Absolute: 0 10*3/uL (ref 0.0–0.1)
Basophils Relative: 0 % (ref 0–1)
EOS ABS: 0.1 10*3/uL (ref 0.0–0.7)
EOS PCT: 1 % (ref 0–5)
HCT: 26.9 % — ABNORMAL LOW (ref 36.0–46.0)
Hemoglobin: 8.2 g/dL — ABNORMAL LOW (ref 12.0–15.0)
LYMPHS ABS: 1.6 10*3/uL (ref 0.7–4.0)
Lymphocytes Relative: 23 % (ref 12–46)
MCH: 23 pg — ABNORMAL LOW (ref 26.0–34.0)
MCHC: 30.5 g/dL (ref 30.0–36.0)
MCV: 75.6 fL — ABNORMAL LOW (ref 78.0–100.0)
Monocytes Absolute: 0.5 10*3/uL (ref 0.1–1.0)
Monocytes Relative: 7 % (ref 3–12)
NEUTROS PCT: 69 % (ref 43–77)
Neutro Abs: 4.8 10*3/uL (ref 1.7–7.7)
Platelets: 179 10*3/uL (ref 150–400)
RBC: 3.56 MIL/uL — AB (ref 3.87–5.11)
RDW: 21.2 % — AB (ref 11.5–15.5)
WBC: 7 10*3/uL (ref 4.0–10.5)

## 2013-10-17 LAB — BASIC METABOLIC PANEL
BUN: 15 mg/dL (ref 6–23)
BUN: 12 mg/dL (ref 6–23)
CALCIUM: 8.6 mg/dL (ref 8.4–10.5)
CHLORIDE: 103 meq/L (ref 96–112)
CO2: 20 mEq/L (ref 19–32)
CO2: 20 mEq/L (ref 19–32)
CREATININE: 1.11 mg/dL — AB (ref 0.50–1.10)
CREATININE: 1.03 mg/dL (ref 0.50–1.10)
Calcium: 8.2 mg/dL — ABNORMAL LOW (ref 8.4–10.5)
Chloride: 99 mEq/L (ref 96–112)
GFR calc Af Amer: 54 mL/min — ABNORMAL LOW (ref >90)
GFR calc non Af Amer: 47 mL/min — ABNORMAL LOW (ref >90)
GFR, EST AFRICAN AMERICAN: 59 mL/min — AB (ref >90)
GFR, EST NON AFRICAN AMERICAN: 51 mL/min — AB (ref >90)
GLUCOSE: 136 mg/dL — AB (ref 70–99)
Glucose, Bld: 162 mg/dL — ABNORMAL HIGH (ref 70–99)
POTASSIUM: 4 meq/L (ref 3.7–5.3)
POTASSIUM: 4 meq/L (ref 3.7–5.3)
Sodium: 134 mEq/L — ABNORMAL LOW (ref 137–147)
Sodium: 135 mEq/L — ABNORMAL LOW (ref 137–147)

## 2013-10-17 LAB — MAGNESIUM: MAGNESIUM: 1.8 mg/dL (ref 1.5–2.5)

## 2013-10-17 MED ORDER — LATANOPROST 0.005 % OP SOLN
1.0000 [drp] | Freq: Every day | OPHTHALMIC | Status: DC
  Administered 2013-10-17 – 2013-10-18 (×2): 1 [drp] via OPHTHALMIC
  Filled 2013-10-17 (×2): qty 2.5

## 2013-10-17 MED ORDER — INSULIN ASPART 100 UNIT/ML ~~LOC~~ SOLN
0.0000 [IU] | Freq: Three times a day (TID) | SUBCUTANEOUS | Status: DC
  Administered 2013-10-17: 5 [IU] via SUBCUTANEOUS
  Administered 2013-10-18 (×2): 3 [IU] via SUBCUTANEOUS

## 2013-10-17 MED ORDER — ENOXAPARIN SODIUM 40 MG/0.4ML ~~LOC~~ SOLN
40.0000 mg | SUBCUTANEOUS | Status: DC
  Administered 2013-10-18 – 2013-10-19 (×2): 40 mg via SUBCUTANEOUS
  Filled 2013-10-17 (×3): qty 0.4

## 2013-10-17 MED ORDER — HYDRALAZINE HCL 25 MG PO TABS
25.0000 mg | ORAL_TABLET | Freq: Three times a day (TID) | ORAL | Status: DC
  Administered 2013-10-17 – 2013-10-19 (×6): 25 mg via ORAL
  Filled 2013-10-17 (×10): qty 1

## 2013-10-17 NOTE — Progress Notes (Signed)
2nd site nsl pink.  Attempt restart, unsuccessful, pt. Tense.  Refused further attempt.

## 2013-10-17 NOTE — Progress Notes (Signed)
Attempted to call transfer report, to return call for report, Berle Mull RN

## 2013-10-17 NOTE — Progress Notes (Signed)
2 RNs unable to get 2nd IV site.  Iv team notified.  Will continue to monitor pt. Alethia Berthold

## 2013-10-17 NOTE — Progress Notes (Signed)
Attempted to call report, RN unavailable, to return call for report, Berle Mull RN

## 2013-10-17 NOTE — Progress Notes (Signed)
Glenda Gonzalez TEAM 1 - Stepdown/ICU TEAM Progress Note  Glenda Gonzalez IRC:[id number] DOB: [birth year] DOA: 10/14/2013 PCP: Glenda Cashing, MD  Admit HPI / Brief Narrative: 80 y.o. F with a history of CAD, Diastolic CHF, HTN, CRI, Dementia, and Seizures who was brought to the ED due to critically high blood sugars, and increased confusion. She lives with her son, and he reported that she suffered a fall but before this had been at her baseline. In the ED, she was found to have a glucose level of 712, was found to be in DKA, and was found to have a UTI.   HPI/Subjective: Pt has no new complaints this morning.  She states she hasn't been able to walk, "for quite a while" but can't tell me why that is.    Assessment/Plan:  DKA in DM  -Resolved -5/24 hemoglobin A1c= 13.4 -CBGs reasonably controlled at present - follow w/o change today   E coli UTI -Ceftriaxone sensitive - plan to complete 7 days of tx   Acute Toxic Metabolic Encephalopathy -due to DKA + UTI   -Resolved  Triglyceridemia -Started niacin 500 mg QHS  CKD Stage III with AKI -Improving with IVFs - follow to stability   Diastolic CHF -Monitor for S/Sxs of Fluid Overload -Increased Metoprolol 25 mg BID -Started Imdur 30 mg daily  HTN -poorly controlled - adjust tx plan and follow   Dementia -Continue Aricept 10 mg daily     Code Status: FULL Family Communication: no family present at time of exam Disposition Plan: transfer to medical bed - begin PT/OT - determine best venue for d/c (suspect SNF will be required)  Consultants: none  Antibiotics: Ceftriaxone 5/22>>  DVT prophylaxis: Lovenox  Objective: VITAL SIGNS: Temp: 98.1 F (36.7 C) (05/25 0813) Temp src: Oral (05/25 0813) BP: 206/83 mmHg (05/25 0813) Pulse Rate: 73 (05/25 0813) SPO2; FIO2:  Intake/Output Summary (Last 24 hours) at 10/17/13 1113 Last data filed at 10/17/13 0800  Gross per 24 hour  Intake   1985 ml  Output   1545 ml  Net     440 ml   Exam: General: No acute respiratory distress Lungs: Clear to auscultation bilaterally without wheezes or crackles Cardiovascular: Regular rate and rhythm without murmur gallop or rub normal S1 and S2 Abdomen: Nontender, nondistended, soft, bowel sounds positive, no rebound, no ascites, no appreciable mass Extremities: No significant cyanosis, clubbing, or edema bilateral lower extremities  Data Reviewed: Basic Metabolic Panel:  Recent Labs Lab 10/16/13 0330  10/16/13 1634 10/16/13 2005 10/17/13 0102 10/17/13 0310 10/17/13 0832  NA 134*  < > 132* 133* 134* 135* 135*  K 4.2  < > 4.2 4.4 4.0 4.6 4.0  CL 99  < > 100 100 103 102 99  CO2 22  < > 20 20 20 19 20   GLUCOSE 221*  < > 226* 260* 162* 194* 136*  BUN 15  < > 17 16 15 15 12   CREATININE 1.14*  < > 1.14* 1.14* 1.11* 1.11* 1.03  CALCIUM 8.4  < > 8.2* 8.1* 8.2* 8.5 8.6  MG 1.6  --   --   --   --  1.8  --   < > = values in this interval not displayed.  Liver Function Tests:  Recent Labs Lab 10/14/13 1602 10/16/13 0330 10/17/13 0310  AST 30 33 59*  ALT 25 20 29   ALKPHOS 223* 166* 178*  BILITOT 0.4 <0.2* 0.2*  PROT 8.2 7.0 7.1  ALBUMIN 3.2* 2.7* 2.7*  CBC:  Recent Labs Lab 10/14/13 1602 10/15/13 0302 10/16/13 0330 10/17/13 0310  WBC 10.9* 8.2 7.6 7.0  NEUTROABS  --   --  4.7 4.8  HGB 8.9* 8.8* 8.5* 8.2*  HCT 29.9* 28.6* 28.1* 26.9*  MCV 75.5* 74.9* 75.3* 75.6*  PLT 219 192 180 179   Cardiac Enzymes:  Recent Labs Lab 10/14/13 1653  TROPONINI <0.30   BNP (last 3 results)  Recent Labs  05/24/13 1850 10/14/13 1653  PROBNP 2226.0* 409.0   CBG:  Recent Labs Lab 10/16/13 1221 10/16/13 1640 10/16/13 1933 10/16/13 2319 10/17/13 0315  GLUCAP 264* 224* 272* 127* 177*    Recent Results (from the past 240 hour(s))  URINE CULTURE     Status: None   Collection Time    10/14/13  8:53 PM      Result Value Ref Range Status   Specimen Description URINE, CATHETERIZED   Final   Special  Requests NONE   Final   Culture  Setup Time     Final   Value: 10/14/2013 21:09     [facility]formed [facility]   [place] Count     Final   Value: >=100,000 COLONIES/ML     [facility]formed at [facility]   Culture     Final   Value: ESCHERICHIA COLI     [facility]formed at [facility]   Report Status 10/17/2013 FINAL   Final   Organism ID, Bacteria ESCHERICHIA COLI   Final  MRSA PCR SCREENING     Status: None   Collection Time    10/15/13 12:51 AM      Result Value Ref Range Status   MRSA by PCR NEGATIVE  NEGATIVE Final   Comment:            The GeneXpert MRSA Assay (FDA     approved for NASAL specimens     only), is one component of a     comprehensive MRSA colonization     surveillance program. It is not     intended to diagnose MRSA     infection nor to guide or     monitor treatment for     MRSA infections.    Studies:  Recent x-ray studies have been reviewed in detail by the Attending Physician  Scheduled Meds:  Scheduled Meds: . aspirin EC  81 mg Oral QHS  . cefTRIAXone (ROCEPHIN)  IV  1 g Intravenous Q24H  . donepezil  10 mg Oral QHS  . enoxaparin (LOVENOX) injection  30 mg Subcutaneous Q24H  . insulin aspart  0-15 Units Subcutaneous 6 times per day  . insulin glargine  10 Units Subcutaneous Daily  . isosorbide mononitrate  30 mg Oral Daily  . ketorolac  1 drop Both Eyes QID  . metoprolol tartrate  25 mg Oral BID  . niacin  500 mg Oral QHS  . prednisoLONE acetate  1 drop Both Eyes QID   Time spent on care of this patient: 40 mins  [name] Berry, MD [facility] For Consults/Admissions - Flow Manager - [phone] Office  [phone] Pager [phone]  On-Call/Text Page:      [email or web address]      password [email or web address]  10/17/2013, 11:13 AM   LOS: 3 days

## 2013-10-17 NOTE — Progress Notes (Signed)
Utilization Review Completed.Glenda Gonzalez T Dowell5/25/2015  

## 2013-10-17 NOTE — Progress Notes (Addendum)
Report given to [facility] RN on [facility], to transfer to [facility]- 16 via bed, nontele, family (son) called and notified of change in location, Berle Mull RN

## 2013-10-17 NOTE — Evaluation (Signed)
Physical Therapy Evaluation Patient Details Name: Glenda Gonzalez MRN: [id number] DOB: Aug 04, 1933 Today's Date: 10/17/2013   History of Present Illness  Pt is a 80 y/o female admitted s/p fall at home, later developing high blood sugars. Per chart review, she was found to have a glucose level of 712 in the ED, and she was found to be in DKA and to have a UTI. PMH signifiicant for stroke in 2014 and dementia.  Clinical Impression  Pt admitted with the above. Pt currently with functional limitations due to the deficits listed below (see PT Problem List). At the time of PT eval pt states she was doing better at home than she demonstrated during session. Pt was fairly fatigued after ambulating in-room. Pt will benefit from skilled PT to increase their independence and safety with mobility to allow discharge to the venue listed below. Pt reports that she has been to rehab before and does not like it, and does not wish to return there - wants to d/c home. PT recommendation based on level of function that was demonstrated during evaluation, as pt was not able to report how much her family is available to assist her at home if needed.     Follow Up Recommendations SNF;Supervision/Assistance - 24 hour (Will likely refuse)    Equipment Recommendations       Recommendations for Other Services       Precautions / Restrictions Precautions Precautions: Fall Restrictions Weight Bearing Restrictions: No      Mobility  Bed Mobility Overal bed mobility: Needs Assistance;+2 for physical assistance Bed Mobility: Supine to Sit;Sit to Supine     Supine to sit: Min assist;+2 for physical assistance Sit to supine: Mod assist;+2 for physical assistance   General bed mobility comments: VC's for sequencing and technique. Pt able to use bed rails minimally for support.   Transfers Overall transfer level: Needs assistance Equipment used: Rolling walker (2 wheeled) Transfers: Sit to/from Stand Sit to  Stand: Mod assist;+2 physical assistance         General transfer comment: Assist to power-up to full standing position with VC's for hand placement on seated surface for safety.   Ambulation/Gait Ambulation/Gait assistance: Min assist Ambulation Distance (Feet): 20 Feet Assistive device: Rolling walker (2 wheeled) Gait Pattern/deviations: Step-through pattern;Decreased stride length;Trunk flexed Gait velocity: Decreased Gait velocity interpretation: Below normal speed for age/gender General Gait Details: Pt was able to ambulate to the door of the room and back to the bed, with assist for walker placement and occasional steadying assist.   Stairs            Wheelchair Mobility    Modified Rankin (Stroke Patients Only)       Balance Overall balance assessment: History of Falls;Needs assistance Sitting-balance support: Feet supported;No upper extremity supported Sitting balance-Leahy Scale: Poor Sitting balance - Comments: Pt requires UE support to maintain balance on EOB.    Standing balance support: Bilateral upper extremity supported Standing balance-Leahy Scale: Poor Standing balance comment: Pt requires increased UE support on RW as gait training progresses.                             Pertinent Vitals/Pain Vitals stable throughout session.     Home Living Family/patient expects to be discharged to:: Private residence Living Arrangements: Spouse/significant other;Children Available Help at Discharge: Family Type of Home: House Home Access: Ramped entrance     Home Layout: One level Home Equipment: Bedside  commode;Wheelchair - [employer] - 2 wheels      Prior Function Level of Independence: Needs assistance   Gait / Transfers Assistance Needed: Pt stated that she walks with RW all the time  ADL's / Homemaking Assistance Needed: Husband assists with sponge bath        Hand Dominance   Dominant Hand: Right    Extremity/Trunk  Assessment   Upper Extremity Assessment: Defer to OT evaluation           Lower Extremity Assessment: Generalized weakness      Cervical / Trunk Assessment: Kyphotic  Communication   Communication: HOH  Cognition Arousal/Alertness: Awake/alert Behavior During Therapy: WFL for tasks assessed/performed Overall Cognitive Status: Within Functional Limits for tasks assessed                      General Comments General comments (skin integrity, edema, etc.): Pt had urinated in the bed and sacral foam barrier was saturated. PT replaced sacral barrier and wet linens.     Exercises        Assessment/Plan    PT Assessment Patient needs continued PT services  PT Diagnosis Difficulty walking;Generalized weakness   PT Problem List Decreased strength;Decreased range of motion;Decreased balance;Decreased activity tolerance;Decreased mobility;Decreased knowledge of use of DME;Decreased safety awareness;Decreased knowledge of precautions  PT Treatment Interventions DME instruction;Gait training;Stair training;Functional mobility training;Therapeutic activities;Therapeutic exercise;Neuromuscular re-education;Patient/family education   PT Goals (Current goals can be found in the Care Plan section) Acute Rehab PT Goals Patient Stated Goal: To return home PT Goal Formulation: With patient Time For Goal Achievement: 10/24/13 Potential to Achieve Goals: Fair    Frequency Min 3X/week   Barriers to discharge        Co-evaluation               End of Session Equipment Utilized During Treatment: Gait belt Activity Tolerance: Patient limited by fatigue Patient left: in bed;with call bell/phone within reach;with bed alarm set Nurse Communication: Mobility status         Time: 7035-0093 PT Time Calculation (min): 22 min   Charges:   PT Evaluation $Initial PT Evaluation Tier I: 1 Procedure PT Treatments $Therapeutic Activity: 8-22 mins   PT G CodesRuthann Cancer 10/17/2013, 4:39 PM  [name], PT, DPT Acute Rehabilitation Services Pager: [phone]

## 2013-10-18 ENCOUNTER — Encounter (HOSPITAL_COMMUNITY): Payer: Self-pay | Admitting: General Practice

## 2013-10-18 LAB — GLUCOSE, CAPILLARY
GLUCOSE-CAPILLARY: 190 mg/dL — AB (ref 70–99)
GLUCOSE-CAPILLARY: 229 mg/dL — AB (ref 70–99)
GLUCOSE-CAPILLARY: 232 mg/dL — AB (ref 70–99)
Glucose-Capillary: 179 mg/dL — ABNORMAL HIGH (ref 70–99)

## 2013-10-18 MED ORDER — INSULIN GLARGINE 100 UNIT/ML ~~LOC~~ SOLN
18.0000 [IU] | Freq: Every day | SUBCUTANEOUS | Status: DC
  Administered 2013-10-19: 18 [IU] via SUBCUTANEOUS
  Filled 2013-10-18: qty 0.18

## 2013-10-18 MED ORDER — INSULIN ASPART 100 UNIT/ML ~~LOC~~ SOLN
0.0000 [IU] | Freq: Three times a day (TID) | SUBCUTANEOUS | Status: DC
  Administered 2013-10-18 – 2013-10-19 (×2): 5 [IU] via SUBCUTANEOUS
  Administered 2013-10-19: 3 [IU] via SUBCUTANEOUS

## 2013-10-18 MED ORDER — ISOSORBIDE MONONITRATE ER 60 MG PO TB24
60.0000 mg | ORAL_TABLET | Freq: Every day | ORAL | Status: DC
  Administered 2013-10-19: 60 mg via ORAL
  Filled 2013-10-18: qty 1

## 2013-10-18 MED ORDER — INSULIN GLARGINE 100 UNIT/ML ~~LOC~~ SOLN
12.0000 [IU] | Freq: Every day | SUBCUTANEOUS | Status: DC
  Administered 2013-10-18: 12 [IU] via SUBCUTANEOUS
  Filled 2013-10-18: qty 0.12

## 2013-10-18 MED ORDER — CEFUROXIME AXETIL 500 MG PO TABS
500.0000 mg | ORAL_TABLET | Freq: Two times a day (BID) | ORAL | Status: DC
  Administered 2013-10-18 – 2013-10-19 (×2): 500 mg via ORAL
  Filled 2013-10-18 (×5): qty 1

## 2013-10-18 MED ORDER — INSULIN ASPART 100 UNIT/ML ~~LOC~~ SOLN
0.0000 [IU] | Freq: Every day | SUBCUTANEOUS | Status: DC
  Administered 2013-10-18: 2 [IU] via SUBCUTANEOUS

## 2013-10-18 NOTE — Progress Notes (Addendum)
[address]Glenda   Glenda FinlandJeannette R Gonzalez ZOX:[id number] DOB: 09-20-1933 DOA: 10/14/2013 PCP: Glenda CashingMCKENZIE, Glenda Gonzalez  Admit HPI / Brief Narrative: 80 y.o. F with a history of CAD, Diastolic CHF, HTN, CRI, Dementia, and Seizures who was brought to the ED due to critically high blood sugars, and increased confusion. She lives with her son, and he reported that she suffered a fall but before this had been at her baseline. In the ED, she was found to have a glucose level of 712, was found to be in DKA, and was found to have a UTI.   HPI/Subjective: Patient is awake an alert, states feeling better. Blood sugars fluctuating between 179 and 214. She is tolerating PO intake, afebrile, no acute distress.   Assessment/Plan:  DKA in DM  -Resolved -5/24 hemoglobin A1c= 13.4 -CBGs fluctuating between 179 and 214. Will increase Lantus to 18 units [place] q daily -Continue accuchecks AC and HS  E coli UTI -On ceftriaxone, will change to oral ceftin as patient is afebrile, nontoxic and tolerating PO  Acute Toxic Metabolic Encephalopathy -due to DKA + UTI   -Resolved  Triglyceridemia -Started niacin 500 mg QHS  CKD Stage III with AKI -Improving with IVFs - follow to stability   Diastolic CHF -Monitor for S/Sxs of Fluid Overload -Increased Metoprolol 25 mg BID -Will increase Imdur to 60 mg daily  HTN -poorly controlled, will increase Imdur to 60 mg PO q daily  Dementia -Continue Aricept 10 mg daily     Code Status: FULL Family Communication: no family present at time of exam Disposition Plan: Will seek SNF placement  Consultants: none  Antibiotics: Ceftriaxone 5/22>>5/26 Ceftin 5/26>>  DVT prophylaxis: Lovenox  Objective: VITAL SIGNS: Temp: 97.7 F (36.5 C) (05/26 1414) Temp src: Axillary (05/26 1414) BP: 159/83 mmHg (05/26 1414) Pulse Rate: 76 (05/26 1414) SPO2; FIO2:  Intake/Output Summary (Last 24 hours) at 10/18/13 1702 Last data filed at 10/17/13 1822  Gross per 24 hour    Intake    120 ml  Output      0 ml  Net    120 ml   Exam: General: No acute respiratory distress Lungs: Clear to auscultation bilaterally without wheezes or crackles Cardiovascular: Regular rate and rhythm without murmur gallop or rub normal S1 and S2 Abdomen: Nontender, nondistended, soft, bowel sounds positive, no rebound, no ascites, no appreciable mass Extremities: No significant cyanosis, clubbing, or edema bilateral lower extremities  Data Reviewed: Basic Metabolic Panel:  Recent Labs Lab 10/16/13 0330  10/16/13 1634 10/16/13 2005 10/17/13 0102 10/17/13 0310 10/17/13 0832  NA 134*  < > 132* 133* 134* 135* 135*  K 4.2  < > 4.2 4.4 4.0 4.6 4.0  CL 99  < > 100 100 103 102 99  CO2 22  < > 20 20 20 19 20   GLUCOSE 221*  < > 226* 260* 162* 194* 136*  BUN 15  < > 17 16 15 15 12   CREATININE 1.14*  < > 1.14* 1.14* 1.11* 1.11* 1.03  CALCIUM 8.4  < > 8.2* 8.1* 8.2* 8.5 8.6  MG 1.6  --   --   --   --  1.8  --   < > = values in this interval not displayed.  Liver Function Tests:  Recent Labs Lab 10/14/13 1602 10/16/13 0330 10/17/13 0310  AST 30 33 59*  ALT 25 20 29   ALKPHOS 223* 166* 178*  BILITOT 0.4 <0.2* 0.2*  PROT 8.2 7.0 7.1  ALBUMIN 3.2* 2.7* 2.7*  CBC:  Recent Labs Lab 10/14/13 1602 10/15/13 0302 10/16/13 0330 10/17/13 0310  WBC 10.9* 8.2 7.6 7.0  NEUTROABS  --   --  4.7 4.8  HGB 8.9* 8.8* 8.5* 8.2*  HCT 29.9* 28.6* 28.1* 26.9*  MCV 75.5* 74.9* 75.3* 75.6*  PLT 219 192 180 179   Cardiac Enzymes:  Recent Labs Lab 10/14/13 1653  TROPONINI <0.30   BNP (last 3 results)  Recent Labs  05/24/13 1850 10/14/13 1653  PROBNP 2226.0* 409.0   CBG:  Recent Labs Lab 10/17/13 1144 10/17/13 1722 10/17/13 2204 10/18/13 0749 10/18/13 1158  GLUCAP 180* 210* 214* 179* 190*    Recent Results (from the past 240 hour(s))  URINE CULTURE     Status: None   Collection Time    10/14/13  8:53 PM      Result Value Ref Range Status   Specimen  Description URINE, CATHETERIZED   Final   Special Requests NONE   Final   Culture  Setup Time     Final   Value: 10/14/2013 21:09     Performed at [employer] Count     Final   Value: >=100,000 COLONIES/ML     Performed at [facility]   Culture     Final   Value: ESCHERICHIA COLI     Performed at [facility]   Report Status 10/17/2013 FINAL   Final   Organism ID, Bacteria ESCHERICHIA COLI   Final  MRSA PCR SCREENING     Status: None   Collection Time    10/15/13 12:51 AM      Result Value Ref Range Status   MRSA by PCR NEGATIVE  NEGATIVE Final   Comment:            The GeneXpert MRSA Assay (FDA     approved for NASAL specimens     only), is one component of a     comprehensive MRSA colonization     surveillance program. It is not     intended to diagnose MRSA     infection nor to guide or     monitor treatment for     MRSA infections.    Studies:  Recent x-ray studies have been reviewed in detail by the Attending Physician  Scheduled Meds:  Scheduled Meds: . aspirin EC  81 mg Oral QHS  . cefTRIAXone (ROCEPHIN)  IV  1 g Intravenous Q24H  . donepezil  10 mg Oral QHS  . enoxaparin (LOVENOX) injection  40 mg Subcutaneous Q24H  . hydrALAZINE  25 mg Oral 3 times per day  . insulin aspart  0-15 Units Subcutaneous TID WC  . insulin glargine  12 Units Subcutaneous Daily  . isosorbide mononitrate  30 mg Oral Daily  . ketorolac  1 drop Both Eyes QID  . latanoprost  1 drop Both Eyes QHS  . metoprolol tartrate  25 mg Oral BID  . niacin  500 mg Oral QHS  . prednisoLONE acetate  1 drop Both Eyes QID   Time spent on care of this patient: 35 mins  On-Call/Text Page:      [email or web address]      password [facility]  10/18/2013, 5:02 PM   LOS: 4 days

## 2013-10-18 NOTE — Progress Notes (Signed)
Inpatient Diabetes Program Recommendations  AACE/ADA: New Consensus Statement on Inpatient Glycemic Control (2013)  Target Ranges:  Prepandial:   less than 140 mg/dL      Peak postprandial:   less than 180 mg/dL (1-2 hours)      Critically ill patients:  140 - 180 mg/dL   Reason for Assessment:  Results for NORALBA, ROSENCRANTZ (MRN [id number]) as of 10/18/2013 12:26  Ref. Range 10/16/2013 03:30  Hemoglobin A1C Latest Range: <5.7 % 13.4 (H)   Diabetes history: Type 2 diabetes Outpatient Diabetes medications: Glucotrol 10 mg with breakfast Current orders for Inpatient glycemic control:  Novolog moderate tid with meals, Lantus 12 units daily  Note that patient was not on insulin prior to admit.  According to H&P, she lives with her son.  MD if insulin is to be ordered at discharge, family will need teaching on insulin administration.  Will follow. CBG's better today.  Beryl Meager, RN, BC-ADM Inpatient Diabetes Coordinator Pager [phone]

## 2013-10-18 NOTE — Clinical Documentation Improvement (Signed)
Possible Clinical Conditions?   Chronic Diastolic Congestive Heart Failure Chronic Systolic & Diastolic Congestive Heart Failure Acute Diastolic Congestive Heart Failure Acute Systolic & Diastolic Congestive Heart Failure Acute on Chronic Diastolic Congestive Heart Failure Acute on Chronic Systolic & Diastolic Congestive Heart Failure Other Condition Cannot Clinically Determine    Risk Factors: Diastolic CHF noted per 5/23 progress notes.  Labs: 5/22: proBNP: 409.0   Thank You, Marciano Sequin, Clinical Documentation Specialist:  [phone]  [facility]- Health Information Management

## 2013-10-18 NOTE — Evaluation (Signed)
Occupational Therapy Evaluation Patient Details Name: Glenda Gonzalez MRN: [id number] DOB: 12/15/1933 Today's Date: 10/18/2013    History of Present Illness Pt is a 80 y/o female admitted s/p fall at home, later developing high blood sugars. Per chart review, she was found to have a glucose level of 712 in the ED, and she was found to be in DKA and to have a UTI. PMH signifiicant for stroke in 2014 and dementia.   Clinical Impression   PT admitted with s/p fall at home. Pt currently with functional limitiations due to the deficits listed below (see OT problem list).  Pt will benefit from skilled OT to increase their independence and safety with adls and balance to allow discharge SNF.     Follow Up Recommendations  SNF    Equipment Recommendations  Other (comment) (defer SNF)    Recommendations for Other Services       Precautions / Restrictions Precautions Precautions: Fall      Mobility Bed Mobility Overal bed mobility: Needs Assistance Bed Mobility: Supine to Sit     Supine to sit: Min assist     General bed mobility comments: pt motivated to exit bed and progressing toward EOB with bedrail  Transfers Overall transfer level: Needs assistance Equipment used: Rolling walker (2 wheeled) Transfers: Sit to/from Stand Sit to Stand: Mod assist         General transfer comment: cues for hand placement    Balance                                            ADL Overall ADL's : Needs assistance/impaired     Grooming: Wash/dry face;Minimal assistance;Sitting                   Toilet Transfer: Moderate assistance;Stand-pivot;RW             General ADL Comments: Pt fatigued and inconsistent with self report of PTA. pt reporting wanting to chair. Chair alarm obtained. pt agreeable to OOB to chair. Pt positioned in chair with pillow under Rt hip due to report of buttock pain     Vision                     Perception      Praxis      Pertinent Vitals/Pain VSS No complaints     Hand Dominance Right   Extremity/Trunk Assessment Upper Extremity Assessment Upper Extremity Assessment: Overall WFL for tasks assessed   Lower Extremity Assessment Lower Extremity Assessment: Defer to PT evaluation   Cervical / Trunk Assessment Cervical / Trunk Assessment: Kyphotic   Communication Communication Communication: HOH   Cognition Arousal/Alertness: Awake/alert Behavior During Therapy: WFL for tasks assessed/performed Overall Cognitive Status: History of cognitive impairments - at baseline                     General Comments       Exercises       Shoulder Instructions      Home Living Family/patient expects to be discharged to:: Private residence Living Arrangements: Spouse/significant other;Children Available Help at Discharge: Family Type of Home: House Home Access: Ramped entrance     Home Layout: One level     Bathroom Shower/Tub: Tub/shower unit         Home Equipment: Bedside commode;Wheelchair - [employer] - 2 wheels  Additional Comments: per patient she does not walk and her husband gives her a bath everyday supine in bed. pt reports being total (A) for BADLs. Pt reports stand pivot to bedside commode at bedside. pt reports she does not walk but instead sits in a w/c      Prior Functioning/Environment Level of Independence: Needs assistance  Gait / Transfers Assistance Needed: pt states she uses a W/c and does not walk 10/18/13 ADL's / Homemaking Assistance Needed: sponge bath bed level total (A) per patient         OT Diagnosis: Generalized weakness;Acute pain;Cognitive deficits   OT Problem List: Decreased range of motion;Decreased strength;Decreased activity tolerance;Impaired balance (sitting and/or standing);Decreased safety awareness;Decreased knowledge of use of DME or AE;Decreased knowledge of precautions;Obesity   OT Treatment/Interventions:  Self-care/ADL training;Therapeutic exercise;DME and/or AE instruction;Therapeutic activities;Patient/family education;Balance training    OT Goals(Current goals can be found in the care plan section) Acute Rehab OT Goals Patient Stated Goal: unable to participate OT Goal Formulation: Patient unable to participate in goal setting Time For Goal Achievement: 11/01/13 Potential to Achieve Goals: Good  OT Frequency: Min 2X/week   Barriers to D/C:            Co-evaluation              End of Session Nurse Communication: Mobility status;Precautions  Activity Tolerance: Patient tolerated treatment well Patient left: in chair;with call bell/phone within reach;with chair alarm set   Time: 1420-1443 OT Time Calculation (min): 23 min Charges:  OT General Charges $OT Visit: 1 Procedure OT Evaluation $Initial OT Evaluation Tier I: 1 Procedure OT Treatments $Self Care/Home Management : 8-22 mins G-Codes:    Glenda Gonzalez 10/18/2013, 3:27 PM Pager: [phone]

## 2013-10-19 LAB — BASIC METABOLIC PANEL
BUN: 10 mg/dL (ref 6–23)
CHLORIDE: 100 meq/L (ref 96–112)
CO2: 19 mEq/L (ref 19–32)
Calcium: 9 mg/dL (ref 8.4–10.5)
Creatinine, Ser: 1.2 mg/dL — ABNORMAL HIGH (ref 0.50–1.10)
GFR calc non Af Amer: 42 mL/min — ABNORMAL LOW (ref >90)
GFR, EST AFRICAN AMERICAN: 49 mL/min — AB (ref >90)
Glucose, Bld: 188 mg/dL — ABNORMAL HIGH (ref 70–99)
POTASSIUM: 4.1 meq/L (ref 3.7–5.3)
SODIUM: 135 meq/L — AB (ref 137–147)

## 2013-10-19 LAB — CBC
HEMATOCRIT: 27.6 % — AB (ref 36.0–46.0)
Hemoglobin: 8.2 g/dL — ABNORMAL LOW (ref 12.0–15.0)
MCH: 22.7 pg — ABNORMAL LOW (ref 26.0–34.0)
MCHC: 29.7 g/dL — ABNORMAL LOW (ref 30.0–36.0)
MCV: 76.2 fL — ABNORMAL LOW (ref 78.0–100.0)
Platelets: 205 10*3/uL (ref 150–400)
RBC: 3.62 MIL/uL — ABNORMAL LOW (ref 3.87–5.11)
RDW: 21.4 % — AB (ref 11.5–15.5)
WBC: 8.4 10*3/uL (ref 4.0–10.5)

## 2013-10-19 LAB — GLUCOSE, CAPILLARY
GLUCOSE-CAPILLARY: 181 mg/dL — AB (ref 70–99)
Glucose-Capillary: 223 mg/dL — ABNORMAL HIGH (ref 70–99)

## 2013-10-19 MED ORDER — INSULIN PEN NEEDLE 29G X 12.7MM MISC: 1.0000 | Freq: Four times a day (QID) | Status: DC

## 2013-10-19 MED ORDER — INSULIN ASPART 100 UNIT/ML FLEXPEN: 2.0000 [IU] | PEN_INJECTOR | Freq: Three times a day (TID) | SUBCUTANEOUS | Status: DC

## 2013-10-19 MED ORDER — "INSULIN SYRINGE 31G X 5/16"" 1 ML MISC": 1.0000 | Freq: Four times a day (QID) | Status: DC

## 2013-10-19 MED ORDER — ISOSORBIDE MONONITRATE ER 60 MG PO TB24: 60.0000 mg | ORAL_TABLET | Freq: Every day | ORAL | Status: DC

## 2013-10-19 MED ORDER — INSULIN ASPART 100 UNIT/ML ~~LOC~~ SOLN: 2.0000 [IU] | Freq: Three times a day (TID) | SUBCUTANEOUS | Status: DC

## 2013-10-19 MED ORDER — CEFUROXIME AXETIL 500 MG PO TABS: 500.0000 mg | ORAL_TABLET | Freq: Two times a day (BID) | ORAL | Status: DC

## 2013-10-19 MED ORDER — METOPROLOL TARTRATE 25 MG PO TABS: 25.0000 mg | ORAL_TABLET | Freq: Two times a day (BID) | ORAL | Status: DC

## 2013-10-19 MED ORDER — INSULIN GLARGINE 100 UNIT/ML SOLOSTAR PEN: 18.0000 [IU] | PEN_INJECTOR | Freq: Every day | SUBCUTANEOUS | Status: DC

## 2013-10-19 MED ORDER — HYDRALAZINE HCL 25 MG PO TABS: 25.0000 mg | ORAL_TABLET | Freq: Three times a day (TID) | ORAL | Status: DC

## 2013-10-19 MED ORDER — INSULIN GLARGINE 100 UNIT/ML ~~LOC~~ SOLN: 18.0000 [IU] | Freq: Every day | SUBCUTANEOUS | Status: DC

## 2013-10-19 MED ORDER — INSULIN STARTER KIT- SYRINGES (ENGLISH)
1.0000 | Freq: Once | Status: DC
  Filled 2013-10-19: qty 1

## 2013-10-19 MED ORDER — NIACIN 500 MG PO TABS: 500.0000 mg | ORAL_TABLET | Freq: Every day | ORAL | Status: DC

## 2013-10-19 MED ORDER — LIVING WELL WITH DIABETES BOOK
Freq: Once | Status: AC
  Administered 2013-10-19: 15:00:00
  Filled 2013-10-19: qty 1

## 2013-10-19 NOTE — Clinical Social Work Note (Signed)
Referred to this CSW today for ?SNF. Chart reviewed and have spoken with patient and family- Patient and son declien SNF  And plan to d/c home with [facility] and DME. CSW to sign off- please contact us if SW needs arise. Reece Levy, MSW, Theresia Majors [phone]

## 2013-10-19 NOTE — Care Management Note (Addendum)
  Page 2 of 2   10/19/2013     1:34:33 PM CARE MANAGEMENT NOTE 10/19/2013  Patient:  Glenda Gonzalez, Glenda Gonzalez   Account Number:  [id number]  Date Initiated:  10/18/2013  Documentation initiated by:  Ronny Flurry  Subjective/Objective Assessment:     Action/Plan:   Anticipated DC Date:  10/19/2013   Anticipated DC Plan:  HOME W HOME HEALTH SERVICES         Choice offered to / List presented to:  C-3 Spouse        HH arranged  HH-1 RN  HH-2 PT  HH-3 OT  HH-4 NURSE'S AIDE      HH agency     Status of service:  Completed, signed off Medicare Important Message given?   (If response is "NO", the following Medicare IM given date fields will be blank) Date Medicare IM given:   Date Additional Medicare IM given:  10/18/2013  Discharge Disposition:  HOME W HOME HEALTH SERVICES  Per UR Regulation:  Reviewed for med. necessity/level of care/duration of stay  If discussed at Long Length of Stay Meetings, dates discussed:    Comments: 10-19-13   [facility] unable to accept referral.   Spoke with Eunice Blase at [place], start of care date would not be until Wednesday June 3 , 2015 .  Spoke with Burna Mortimer at [place] , [facility] is unable to accept referral due to staffing.   Okey Regal at [place] is able to accept referral , start of care date is Monday June 1 , 2015 .   Above explained to patient, patient's son and patient's husband. They would like [facility] . Referral faxed to [facility] at [facility] phone [phone] fax [phone] .   Ronny Flurry RN BSN          10-19-13 Patient's husband states patient has bedside commode , walker and wheelchair already at home . Ronny Flurry RN BSN   10-19-13 Spoke to patient bedside and her husband via phone . Both want patient to be discharged home . Both are aware of PT recommendation of SNF . Confirmed face sheet information with patient's husband .  Ronny Flurry RN BSN

## 2013-10-19 NOTE — Discharge Instructions (Signed)
Diabetes Meal Planning Guide The diabetes meal planning guide is a tool to help you plan your meals and snacks. It is important for people with diabetes to manage their blood glucose (sugar) levels. Choosing the right foods and the right amounts throughout your day will help control your blood glucose. Eating right can even help you improve your blood pressure and reach or maintain a healthy weight. CARBOHYDRATE COUNTING MADE EASY When you eat carbohydrates, they turn to sugar. This raises your blood glucose level. Counting carbohydrates can help you control this level so you feel better. When you plan your meals by counting carbohydrates, you can have more flexibility in what you eat and balance your medicine with your food intake. Carbohydrate counting simply means adding up the total amount of carbohydrate grams in your meals and snacks. Try to eat about the same amount at each meal. Foods with carbohydrates are listed below. Each portion below is 1 carbohydrate serving or 15 grams of carbohydrates. Ask your dietician how many grams of carbohydrates you should eat at each meal or snack. Grains and Starches  1 slice bread.   English muffin or hotdog/hamburger bun.   cup cold cereal (unsweetened).   cup cooked pasta or rice.   cup starchy vegetables (corn, potatoes, peas, beans, winter squash).  1 tortilla (6 inches).   bagel.  1 waffle or pancake (size of a CD).   cup cooked cereal.  4 to 6 small crackers. *Whole grain is recommended. Fruit  1 cup fresh unsweetened berries, melon, papaya, pineapple.  1 small fresh fruit.   banana or mango.   cup fruit juice (4 oz unsweetened).   cup canned fruit in natural juice or water.  2 tbs dried fruit.  12 to 15 grapes or cherries. Milk and Yogurt  1 cup fat-free or 1% milk.  1 cup soy milk.  6 oz light yogurt with sugar-free sweetener.  6 oz low-fat soy yogurt.  6 oz plain yogurt. Vegetables  1 cup raw or  cup  cooked is counted as 0 carbohydrates or a "free" food.  If you eat 3 or more servings at 1 meal, count them as 1 carbohydrate serving. Other Carbohydrates   oz chips or pretzels.   cup ice cream or frozen yogurt.   cup sherbet or sorbet.  2 inch square cake, no frosting.  1 tbs honey, sugar, jam, jelly, or syrup.  2 small cookies.  3 squares of graham crackers.  3 cups popcorn.  6 crackers.  1 cup broth-based soup.  Count 1 cup casserole or other mixed foods as 2 carbohydrate servings.  Foods with less than 20 calories in a serving may be counted as 0 carbohydrates or a "free" food. You may want to purchase a book or computer software that lists the carbohydrate gram counts of different foods. In addition, the nutrition facts panel on the labels of the foods you eat are a good source of this information. The label will tell you how big the serving size is and the total number of carbohydrate grams you will be eating per serving. Divide this number by 15 to obtain the number of carbohydrate servings in a portion. Remember, 1 carbohydrate serving equals 15 grams of carbohydrate. SERVING SIZES Measuring foods and serving sizes helps you make sure you are getting the right amount of food. The list below tells how big or small some common serving sizes are.  1 oz.........4 stacked dice.  3 oz........Marland KitchenDeck of cards.  1 tsp.......Marland KitchenTip  of little finger.  1 tbs........Thumb.  2 tbs........Golf ball.   cup.......Half of a fist.  1 cup........A fist. SAMPLE DIABETES MEAL PLAN Below is a sample meal plan that includes foods from the grain and starches, dairy, vegetable, fruit, and meat groups. A dietician can individualize a meal plan to fit your calorie needs and tell you the number of servings needed from each food group. However, controlling the total amount of carbohydrates in your meal or snack is more important than making sure you include all of the food groups at every  meal. You may interchange carbohydrate containing foods (dairy, starches, and fruits). The meal plan below is an example of a 2000 calorie diet using carbohydrate counting. This meal plan has 17 carbohydrate servings. Breakfast  1 cup oatmeal (2 carb servings).   cup light yogurt (1 carb serving).  1 cup blueberries (1 carb serving).   cup almonds. Snack  1 large apple (2 carb servings).  1 low-fat string cheese stick. Lunch  Chicken breast salad.  1 cup spinach.   cup chopped tomatoes.  2 oz chicken breast, sliced.  2 tbs low-fat Italian dressing.  12 whole-wheat crackers (2 carb servings).  12 to 15 grapes (1 carb serving).  1 cup low-fat milk (1 carb serving). Snack  1 cup carrots.   cup hummus (1 carb serving). Dinner  3 oz broiled salmon.  1 cup brown rice (3 carb servings). Snack  1  cups steamed broccoli (1 carb serving) drizzled with 1 tsp olive oil and lemon juice.  1 cup light pudding (2 carb servings). DIABETES MEAL PLANNING WORKSHEET Your dietician can use this worksheet to help you decide how many servings of foods and what types of foods are right for you.  BREAKFAST Food Group and Servings / Carb Servings Grain/Starches __________________________________ Dairy __________________________________________ Vegetable ______________________________________ Fruit ___________________________________________ Meat __________________________________________ Fat ____________________________________________ LUNCH Food Group and Servings / Carb Servings Grain/Starches ___________________________________ Dairy ___________________________________________ Fruit ____________________________________________ Meat ___________________________________________ Fat _____________________________________________ DINNER Food Group and Servings / Carb Servings Grain/Starches ___________________________________ Dairy  ___________________________________________ Fruit ____________________________________________ Meat ___________________________________________ Fat _____________________________________________ SNACKS Food Group and Servings / Carb Servings Grain/Starches ___________________________________ Dairy ___________________________________________ Vegetable _______________________________________ Fruit ____________________________________________ Meat ___________________________________________ Fat _____________________________________________ DAILY TOTALS Starches _________________________ Vegetable ________________________ Fruit ____________________________ Dairy ____________________________ Meat ____________________________ Fat ______________________________ Document Released: 02/06/2005 Document Revised: 08/04/2011 Document Reviewed: 12/18/2008 [employer] Patient Information 2014 [employer]. Diabetes, Eating Away From Home Sometimes, you might eat in a restaurant or have meals that are prepared by someone else. You can enjoy eating out. However, the portions in restaurants may be much larger than needed. Listed below are some ideas to help you choose foods that will keep your blood glucose (sugar) in better control.  TIPS FOR EATING OUT  Know your meal plan and how many carbohydrate servings you should have at each meal. You may wish to carry a copy of your meal plan in your purse or wallet. Learn the foods included in each food group.  Make a list of restaurants near you that offer healthy choices. Take a copy of the carry-out menus to see what they offer. Then, you can plan what you will order ahead of time.  Become familiar with serving sizes by practicing them at home using measuring cups and spoons. Once you learn to recognize portion sizes, you will be able to correctly estimate the amount of total carbohydrate you are allowed to eat at the restaurant. Ask for a takeout box if the  portion is more than you   should have. When your food comes, leave the amount you should have on the plate, and put the rest in the takeout box before you start eating.  Plan ahead if your mealtime will be different from usual. Check with your caregiver to find out how to time meals and medicine if you are taking insulin.  Avoid high-fat foods, such as fried foods, cream sauces, high-fat salad dressings, or any added butter or margarine.  Do not be afraid to ask questions. Ask your server about the portion size, cooking methods, ingredients and if items can be substituted. Restaurants do not list all available items on the menu. You can ask for your main entree to be prepared using skim milk, oil instead of butter or margarine, and without gravy or sauces. Ask your waiter or waitress to serve salad dressings, gravy, sauces, margarine, and sour cream on the side. You can then add the amount your meal plan suggests.  Add more vegetables whenever possible.  Avoid items that are labeled "jumbo," "giant," "deluxe," or "supersized."  You may want to split an entre with someone and order an extra side salad.  Watch for hidden calories in foods like croutons, bacon, or cheese.  Ask your server to take away the bread basket or chips from your table.  Order a dinner salad as an appetizer. You can eat most foods served in a restaurant. Some foods are better choices than others. Breads and Starches  Recommended: All kinds of bread (wheat, rye, white, oatmeal, Italian, French, raisin), hard or soft dinner rolls, frankfurter or hamburger buns, small bagels, small corn or whole-wheat flour tortillas.  Avoid: Frosted or glazed breads, butter rolls, egg or cheese breads, croissants, sweet rolls, pastries, coffee cake, glazed or frosted doughnuts, muffins. Crackers  Recommended: Animal crackers, graham, rye, saltine, oyster, and matzoth crackers. Bread sticks, melba toast, rusks, pretzels, popcorn (without  fat), zwieback toast.  Avoid: High-fat snack crackers or chips. Buttered popcorn. Cereals  Recommended: Hot and cold cereals. Whole grains such as oatmeal or shredded wheat are good choices.  Avoid: Sugar-coated or granola type cereals. Potatoes/Pasta/Rice/Beans  Recommended: Order baked, boiled, or mashed potatoes, rice or noodles without added fat, whole beans. Order gravies, butter, margarine, or sauces on the side so you can control the amount you add.  Avoid: Hash browns or fried potatoes. Potatoes, pasta, or rice prepared with cream or cheese sauce. Potato or pasta salads prepared with large amounts of dressing. Fried beans or fried rice. Vegetables  Recommended: Order steamed, baked, boiled, or stewed vegetables without sauces or extra fat. Ask that sauce be served on the side. If vegetables are not listed on the menu, ask what is available.  Avoid: Vegetables prepared with cream, butter, or cheese sauce. Fried vegetables. Salad Bars  Recommended: Many of the vegetables at a salad bar are considered "free." Use lemon juice, vinegar, or low-calorie salad dressing (fewer than 20 calories per serving) as "free" dressings for your salad. Look for salad bar ingredients that have no added fat or sugar such as tomatoes, lettuce, cucumbers, broccoli, carrots, onions, and mushrooms.  Avoid: Prepared salads with large amounts of dressing, such as coleslaw, caesar salad, macaroni salad, bean salad, or carrot salad. Fruit  Recommended: Eat fresh fruit or fresh fruit salad without added dressing. A salad bar often offers fresh fruit choices, but canned fruit at a restaurant is usually packed in sugar or syrup.  Avoid: Sweetened canned or frozen fruits, plain or sweetened fruit juice. Fruit salads with dressing,   dressing, sour cream, or sugar added to them. Meat and Meat Substitutes  Recommended: Order broiled, baked, roasted, or grilled meat, poultry, or fish. Trim off all visible fat. Do not eat the  skin of poultry. The size stated on the menu is the raw weight. Meat shrinks by  in cooking (for example, 4 oz raw equals 3 oz cooked meat).  Avoid: Deep-fat fried meat, poultry, or fish. Breaded meats. Eggs  Recommended: Order soft, hard-cooked, poached, or scrambled eggs. Omelets may be okay, depending on what ingredients are added. Egg substitutes are also a good choice.  Avoid: Fried eggs, eggs prepared with cream or cheese sauce. Milk  Recommended: Order low-fat or fat-free milk according to your meal plan. Plain, nonfat yogurt or flavored yogurt with no sugar added may be used as a substitute for milk. Soy milk may also be used.  Avoid: Milk shakes or sweetened milk beverages. Soups and Combination Foods  Recommended: Clear broth or consomm are "free" foods and may be used as an appetizer. Broth-based soups with fat removed count as a starch serving and are preferred over cream soups. Soups made with beans or split peas may be eaten but count as a starch.  Avoid: Fatty soups, soup made with cream, cheese soup. Combination foods prepared with excessive amounts of fat or with cream or cheese sauces. Desserts and Sweets  Recommended: Ask for fresh fruit. Sponge or angel food cake without icing, ice milk, no sugar added ice cream, sherbet, or frozen yogurt may fit into your meal plan occasionally.  Avoid: Pastries, puddings, pies, cakes with icing, custard, gelatin desserts. Fats and Oils  Recommended: Choose healthy fats such as olive oil, canola oil, or tub margarine, reduced fat or fat-free sour cream, cream cheese, avocado, or nuts.  Avoid: Any fats in excess of your allowed portion. Deep-fried foods or any food with a large amount of fat. Note: Ask for all fats to be served on the side, and limit your portion sizes according to your meal plan. Document Released: 05/12/2005 Document Revised: 08/04/2011 Document Reviewed: 11/30/2008 [facility] Patient Information 2014 [place],  [place].

## 2013-10-19 NOTE — Progress Notes (Signed)
Inpatient Diabetes Program Recommendations  AACE/ADA: New Consensus Statement on Inpatient Glycemic Control (2013)  Target Ranges:  Prepandial:   less than 140 mg/dL      Peak postprandial:   less than 180 mg/dL (1-2 hours)      Critically ill patients:  140 - 180 mg/dL   Reason for Assessment:  Patient being discharged home with family to home.   Taught son and husband how to administer insulin with insulin pen.  They state that they prefer this method due to convenience.  Gave them insulin starter kit which included insulin pen needles and booklets on how to administer insulin, treat hypoglycemia, and a blood glucose diary.  So was able to properly demonstrate use of insulin pen including 2 unit prime, dialing correct dose, holding for 10 seconds and proper disposal of needles.  He also could teach back this method.  Also taught S&S of hyperglycemia, hypoglycemia and treatment.  He was also able to teach back proper treatment of low and high CBG's.  Instructed them to check CBG's 4 times a day and to follow-up with Dr. Alyson Ingles in the next week.  Patient will have home health to follow-up with her and the family.  Discussed with RN Tammy.  She has sent text page to Dr. Wyline Copas requesting insulin pen for insulin administration.    Adah Perl, RN, BC-ADM Inpatient Diabetes Coordinator Pager [phone]

## 2013-10-19 NOTE — Plan of Care (Signed)
Problem: Discharge Progression Outcomes Goal: Pt. states knowledge of home DM medications Outcome: Completed/Met Date Met:  10/19/13 Son and husband care for pt meds, and state understanding and demo of DM meds/insulins.

## 2013-10-19 NOTE — Progress Notes (Signed)
D/c instructions reviewed with pt's son and husband, pt present at time of review (pt has dementia-family are primary caregivers). Copy of instructions and scripts given to family. Living well with DM book given to family. Pt d/c'd with family, via wheelchair escorted by unit NT.

## 2013-10-19 NOTE — Discharge Summary (Addendum)
Physician Discharge Summary  Glenda Gonzalez ION:[id number] DOB: 1933/08/18 DOA: 10/14/2013  PCP: Billee Cashing, MD  Admit date: 10/14/2013 Discharge date: 10/19/2013  Time spent: 35 minutes  Recommendations for Outpatient Follow-up:  1. Follow up with PCP in 1-2 weeks  Discharge Diagnoses:  Principal Problem:   DKA (diabetic ketoacidoses) Active Problems:   DEMENTIA, MILD   HYPERTENSION   Anemia of chronic disease   CKD (chronic kidney disease), stage III   Acute encephalopathy   UTI (lower urinary tract infection)   Diastolic CHF   Elevated cholesterol with high triglycerides   Discharge Condition: Improved  Diet recommendation: Diabetic  Filed Weights   10/15/13 0032 10/17/13 0317  Weight: 83.7 kg (184 lb 8.4 oz) 85.5 kg (188 lb 7.9 oz)    History of present illness:  Glenda Gonzalez is a 80 y.o. female with a history of CAD, CHF, HTN, CRI, and Dementia who was brought to the ED due to Critically high blood sugars at home today, and increased confusion. She lives with her son, and he reports that she suffered a fall in the AM, but before this she had been at her baseline. In the ED, she was found to have a glucose level of 712, and she was found to be in DKA and to have a UTI, and was referred for medical admission.   Hospital Course:  DKA in DM  -Resolved with an insulin gtt and aggressive IV fluids -5/24 hemoglobin A1c= 13.4  -CBGs fluctuating between mid 100's to low 200's. Increased Lantus to 18 units Waucoma q daily on 5/26 -Will stop glipizide on d/c and cont on 2 units of novolog for meal coverage  E coli UTI  -Initially on ceftriaxone, change to oral ceftin as patient is afebrile, nontoxic and tolerating PO -Pt will complete course of antibiotics on 5/28 for a 7 day course of antibiotics Acute Toxic Metabolic Encephalopathy  -due to DKA + UTI  -Resolved  Triglyceridemia  -Started niacin 500 mg QHS  CKD Stage III with AKI  -Improving with IVFs - follow  to stability  Diastolic CHF  -Monitor for S/Sxs of Fluid Overload  -Increased Metoprolol 25 mg BID  -Will increase Imdur to 60 mg daily  HTN  -poorly controlled, will increase Imdur to 60 mg PO q daily  Dementia  -Continue Aricept 10 mg daily   Discharge Exam: Filed Vitals:   10/18/13 0625 10/18/13 1414 10/18/13 2058 10/19/13 0643  BP: 155/81 159/83 169/73 158/92  Pulse: 80 76 76 86  Temp: 97.8 F (36.6 C) 97.7 F (36.5 C) 97.6 F (36.4 C) 98.2 F (36.8 C)  TempSrc: Oral Axillary Oral Oral  Resp: 18 22 20 20   Height:      Weight:      SpO2: 98% 97% 96% 97%    General: awake, in nad Cardiovascular: regular, s1, s2 Respiratory: normal resp effort, no wheezing  Discharge Instructions     Medication List    STOP taking these medications       glipiZIDE 10 MG tablet  Commonly known as:  GLUCOTROL     isosorbide-hydrALAZINE 20-37.5 MG per tablet  Commonly known as:  BIDIL      TAKE these medications       albuterol 108 (90 BASE) MCG/ACT inhaler  Commonly known as:  PROVENTIL HFA;VENTOLIN HFA  Inhale 2 puffs into the lungs every 6 (six) hours as needed for wheezing or shortness of breath.     benzonatate 200 MG capsule  Commonly known as:  TESSALON  Take 1 capsule (200 mg total) by mouth 3 (three) times daily as needed for cough.     bimatoprost 0.01 % Soln  Commonly known as:  LUMIGAN  Apply 1 drop to eye 2 (two) times daily.     cefUROXime 500 MG tablet  Commonly known as:  CEFTIN  Take 1 tablet (500 mg total) by mouth 2 (two) times daily with a meal.     diphenhydrAMINE 25 mg capsule  Commonly known as:  BENADRYL  Take 25 mg by mouth at bedtime as needed for itching or sleep.     donepezil 10 MG tablet  Commonly known as:  ARICEPT  Take 10 mg by mouth at bedtime.     esomeprazole 40 MG capsule  Commonly known as:  NEXIUM  Take 40 mg by mouth 2 (two) times daily.     guaiFENesin-dextromethorphan 100-10 MG/5ML syrup  Commonly known as:   ROBITUSSIN DM  Take 5 mLs by mouth every 4 (four) hours as needed for cough.     hydrALAZINE 25 MG tablet  Commonly known as:  APRESOLINE  Take 1 tablet (25 mg total) by mouth every 8 (eight) hours.     insulin aspart 100 UNIT/ML FlexPen  Commonly known as:  NOVOLOG FLEXPEN  Inject 2 Units into the skin 3 (three) times daily with meals.     Insulin Glargine 100 UNIT/ML Solostar Pen  Commonly known as:  LANTUS  Inject 18 Units into the skin daily at 10 pm.     Insulin Pen Needle 29G X 12.7MM Misc  1 Device by Does not apply route 4 (four) times daily.     isosorbide mononitrate 60 MG 24 hr tablet  Commonly known as:  IMDUR  Take 1 tablet (60 mg total) by mouth daily.     ketorolac 0.4 % Soln  Commonly known as:  ACULAR  Place 1 drop into both eyes 4 (four) times daily.     metoprolol tartrate 25 MG tablet  Commonly known as:  LOPRESSOR  Take 1 tablet (25 mg total) by mouth 2 (two) times daily.     niacin 500 MG tablet  Take 1 tablet (500 mg total) by mouth at bedtime.     nitroGLYCERIN 0.4 MG SL tablet  Commonly known as:  NITROSTAT  Place 0.4 mg under the tongue every 5 (five) minutes as needed for chest pain.     prednisoLONE acetate 1 % ophthalmic suspension  Commonly known as:  PRED FORTE  Place 1 drop into both eyes 4 (four) times daily.     pregabalin 50 MG capsule  Commonly known as:  LYRICA  Take 50 mg by mouth 3 (three) times daily.     SYSTANE ULTRA OP  Place 1 drop into both eyes daily as needed. For dry eyes       Allergies  Allergen Reactions  . Ace Inhibitors Shortness Of Breath and Other (See Comments)    Angioedema   . Codeine Other (See Comments)    hallucination  . Fentanyl Other (See Comments)    Abnormal behavior per husband  . Other Other (See Comments)    Beef and turkey-causes gout flareup  . Morphine Itching  . Penicillins Itching   Follow-up Information   Follow up with Billee Cashing, MD. Schedule an appointment as soon as  possible for a visit in 1 week.   Specialty:  Family Medicine   Contact information:   [address]  [address] [place] [address] [phone]        The results of significant diagnostics from this hospitalization (including imaging, microbiology, ancillary and laboratory) are listed below for reference.    Significant Diagnostic Studies: Dg Chest 2 View  10/14/2013   CLINICAL DATA:  Cough.  EXAM: CHEST  2 VIEW  COMPARISON:  05/24/2013 and 03/24/2013  FINDINGS: Heart size and pulmonary vascularity are normal and the lungs are clear. No effusions. Moderate hiatal hernia. No acute osseous abnormality.  IMPRESSION: No acute abnormality of the chest.   Electronically Signed   By: Geanie CooleyJim  Maxwell M.D.   On: 10/14/2013 19:47   Dg Pelvis 1-2 Views  10/14/2013   CLINICAL DATA:  Patient not able to use legs normally  EXAM: PELVIS - 1-2 VIEW  COMPARISON:  07/13/2012  FINDINGS: No fracture. No bone lesion. Mild arthropathic changes involves both hips. The SI joints and symphysis pubis are normally aligned. The bones are diffusely demineralized. Soft tissues demonstrate femoral and iliac vascular calcifications and suture material in the left lower quadrant.  IMPRESSION: 1. No fracture or bone lesion.  No acute finding.   Electronically Signed   By: Amie Portlandavid  Ormond M.D.   On: 10/14/2013 19:38   Ct Head Wo Contrast  10/14/2013   CLINICAL DATA:  Fall.  EXAM: CT HEAD WITHOUT CONTRAST  CT CERVICAL SPINE WITHOUT CONTRAST  TECHNIQUE: Multidetector CT imaging of the head and cervical spine was performed following the standard protocol without intravenous contrast. Multiplanar CT image reconstructions of the cervical spine were also generated.  COMPARISON:  MRI 06/09/2012  FINDINGS: CT HEAD FINDINGS  Generalized atrophy. Chronic cerebellar infarcts bilaterally. Chronic left occipital infarct. Chronic microvascular ischemic change in the white matter.  Negative for acute infarct, hemorrhage, or mass lesion. Negative for  skull fracture.  CT CERVICAL SPINE FINDINGS  Image quality degraded by mild motion.  Negative for fracture or mass.  Moderate spondylosis at C5-6 and C6-7. This is causing mild spinal stenosis.  IMPRESSION: Atrophy and chronic ischemia.  No acute intracranial abnormality.  Cervical spondylosis.  Negative for fracture.   Electronically Signed   By: Marlan Palauharles  Clark M.D.   On: 10/14/2013 20:52   Ct Cervical Spine Wo Contrast  10/14/2013   CLINICAL DATA:  Fall.  EXAM: CT HEAD WITHOUT CONTRAST  CT CERVICAL SPINE WITHOUT CONTRAST  TECHNIQUE: Multidetector CT imaging of the head and cervical spine was performed following the standard protocol without intravenous contrast. Multiplanar CT image reconstructions of the cervical spine were also generated.  COMPARISON:  MRI 06/09/2012  FINDINGS: CT HEAD FINDINGS  Generalized atrophy. Chronic cerebellar infarcts bilaterally. Chronic left occipital infarct. Chronic microvascular ischemic change in the white matter.  Negative for acute infarct, hemorrhage, or mass lesion. Negative for skull fracture.  CT CERVICAL SPINE FINDINGS  Image quality degraded by mild motion.  Negative for fracture or mass.  Moderate spondylosis at C5-6 and C6-7. This is causing mild spinal stenosis.  IMPRESSION: Atrophy and chronic ischemia.  No acute intracranial abnormality.  Cervical spondylosis.  Negative for fracture.   Electronically Signed   By: Marlan Palauharles  Clark M.D.   On: 10/14/2013 20:52   Dg Abd 2 Views  10/14/2013   CLINICAL DATA:  Poor appetite.  EXAM: ABDOMEN - 2 VIEW  COMPARISON:  07/13/2012  FINDINGS: Normal bowel gas pattern. No free air. Vascular calcifications are noted along the iliac femoral arteries. Stage mature projects over the left sacral a ala. Soft tissues are otherwise unremarkable. There are degenerative  changes of the lower lumbar spine.  IMPRESSION: No acute findings.   Electronically Signed   By: Amie Portland M.D.   On: 10/14/2013 19:35    Microbiology: Recent  Results (from the past 240 hour(s))  URINE CULTURE     Status: None   Collection Time    10/14/13  8:53 PM      Result Value Ref Range Status   Specimen Description URINE, CATHETERIZED   Final   Special Requests NONE   Final   Culture  Setup Time     Final   Value: 10/14/2013 21:09     Performed at [facility] Count     Final   Value: >=100,000 COLONIES/ML     Performed at [facility]   Culture     Final   Value: ESCHERICHIA COLI     Performed at [facility]   Report Status 10/17/2013 FINAL   Final   Organism ID, Bacteria ESCHERICHIA COLI   Final  MRSA PCR SCREENING     Status: None   Collection Time    10/15/13 12:51 AM      Result Value Ref Range Status   MRSA by PCR NEGATIVE  NEGATIVE Final   Comment:            The GeneXpert MRSA Assay (FDA     approved for NASAL specimens     only), is one component of a     comprehensive MRSA colonization     surveillance program. It is not     intended to diagnose MRSA     infection nor to guide or     monitor treatment for     MRSA infections.     Labs: Basic Metabolic Panel:  Recent Labs Lab 10/16/13 0330  10/16/13 2005 10/17/13 0102 10/17/13 0310 10/17/13 1610 10/19/13 0707  NA 134*  < > 133* 134* 135* 135* 135*  K 4.2  < > 4.4 4.0 4.6 4.0 4.1  CL 99  < > 100 103 102 99 100  CO2 22  < > 20 20 19 20 19   GLUCOSE 221*  < > 260* 162* 194* 136* 188*  BUN 15  < > 16 15 15 12 10   CREATININE 1.14*  < > 1.14* 1.11* 1.11* 1.03 1.20*  CALCIUM 8.4  < > 8.1* 8.2* 8.5 8.6 9.0  MG 1.6  --   --   --  1.8  --   --   < > = values in this interval not displayed. Liver Function Tests:  Recent Labs Lab 10/14/13 1602 10/16/13 0330 10/17/13 0310  AST 30 33 59*  ALT 25 20 29   ALKPHOS 223* 166* 178*  BILITOT 0.4 <0.2* 0.2*  PROT 8.2 7.0 7.1  ALBUMIN 3.2* 2.7* 2.7*   No results found for this basename: LIPASE, AMYLASE,  in the last 168 hours No results found for this basename: AMMONIA,  in the  last 168 hours CBC:  Recent Labs Lab 10/14/13 1602 10/15/13 0302 10/16/13 0330 10/17/13 0310 10/19/13 0707  WBC 10.9* 8.2 7.6 7.0 8.4  NEUTROABS  --   --  4.7 4.8  --   HGB 8.9* 8.8* 8.5* 8.2* 8.2*  HCT 29.9* 28.6* 28.1* 26.9* 27.6*  MCV 75.5* 74.9* 75.3* 75.6* 76.2*  PLT 219 192 180 179 205   Cardiac Enzymes:  Recent Labs Lab 10/14/13 1653  TROPONINI <0.30   BNP: BNP (last 3 results)  Recent Labs  05/24/13  1850 10/14/13 1653  PROBNP 2226.0* 409.0   CBG:  Recent Labs Lab 10/18/13 1158 10/18/13 1708 10/18/13 2103 10/19/13 0756 10/19/13 1151  GLUCAP 190* 229* 232* 181* 223*    Signed:  Jerald Kief  [facility] 10/19/2013, 2:54 PM

## 2013-11-18 ENCOUNTER — Other Ambulatory Visit: Payer: Self-pay | Admitting: Ophthalmology

## 2013-11-18 NOTE — H&P (Signed)
Patient Record  Glenda Gonzalez, Glenda Gonzalez  Patient Number:  [id number] Date of Birth:  January 07, 1934 Age:  80 years old    Gender:  Female Date of Evaluation:  November 18, 2013  Chief Complaint:   80 year old female patient is seen back in follow up of chronic  Macular Edema OS. Her vision OS remains poor. History of Present Illness:   80 yo female patient for follow up cataract surgery os.  Surgery done 03/30/13 by Dr. Clarisa KindredGeiger.  On pred forte qid os and lumgan od.  C/O blurred vision.  No pain or discomfort.  No pain or discomfort.  Presents for post op follow up. Past History:  Allergies:  Codeine, Active Penicillin, Active Morphine, Active Medications:   Ocular Medications:  Pred Forte 1gtt OS QID Acular 1gtt OS BID Lumigan 1gtt OD Q HS Other Medications:  Premarin 2 tab q d, Glyburide 1 tab q d, Metformin 1 tab BID, Toprol 1 tab q d, Asprin 1 tab q dLumigan (bimatoprost) by Nadyne Coombeshomas  [place], drops 0.01% Apply 1 drop into both eyes every night, 2.5 ml, Start Date:04/02/2011, Zylet (tobramycin-lotepred) by Nadyne Coombeshomas  [place], drops,suspension 0.3-0.5% 1 drop into both eyes four times a day, 5 ml, Start Date:02/03/2011, Stop Date:02/03/2012 Birth History:  none Past Ocular History:   cataract  dry eye syndrome  Past Medical History:   high blood pressure , 250.00 Diabetes mellitus, non-insulin dependent Congested Heart Failure Past Surgical History:   hysterectomy  cyst removal appendectomy  Family History:  no amblyopia, no blindness, no cataracts, no crossed eyes, no diabetic retinopathy, + glaucoma (brother), no macular degeneration, no retinal detachment, + cancer (sister), + diabetes (brother), + heart disease (brother x4), + high blood pressure (brother), + stroke (mother) Social History:   Smoking Status: never smoker  Alcohol:  none   Driving status:  not driving Marital status:  married Review of Systems:   Constitutional: + weight gain, + weight loss  Eyes: + blurred vision    Ear/Nose/Throat: + hearing loss  Cardiovascular:  + chest pain, + high cholesterol  Respiratory: + wheezing   Gastrointestinal: + abdominal pain, + nausea  Genitourinary:  no blood in urine, no discomfort    Musculoskeletal: + low back pain  Integumentary skin/breast: + rashes  Neurological: + numbness  Psychiatric:  no anxiety, no depression    Endocrine:  no heat intolerance, no thyroid problems    Hematologic/Lymphatic:  no anemia, no unusual bleeding    Allergic/Immunologic:  no hives, no seasonal allergies    Examination:  Visual Acuity:   Distance VA cc:  OD: 20/50-1    OS: 20/100 IOP:  OD:  12     OS:  13    @ 03:53PM (Goldmann applanation) Manifest Refraction:    Sphere    Cyl Axis       VA         Add       VA Prism Base R:  +1.00                20/50       +2.75                      L:  +1.75  -1.75  105    20/50       +2.75                       comments: 06/08/13  Confrontation visual  field:  OU:  Normal  Motility:  OU:  Normal  Pupils:  OU:  Shape, size, direct and consensual reaction normal  Adnexa:  Preauricular LN, lacrimal drainage, lacrimal glands, orbit normal  Eyelids:  Eyelids:  blepharochalasis ou Conjunctiva:  OD:  bulbar, palpebral normal OS:  mild injection  Cornea:  OU:  spk  Anterior Chamber:  OU:  2+ deep in periphery OU, clear  Iris:  OU:  normal Dilation:  OU: Tropicamide 1%, AK-Dilate @ 04:02PM  Lens:  OD:  3+ nuclear sclerotic cataract OS:  PC IOL in good position  Vitreous:  OU:  normal  Optic Disc:  OD:  size, C/D ratio, appearance, NFL normal, cupping: 0.85 , inferior bare rim OS:  cupping: 0.85, inferior bare rim 2 clock hours  Macula:  OD:  normal OS:  diffuse CME with FMA, fine ERM    Vessels:  OU:  normal  Periphery:  OU:  normal Orientation to person, place and time:  Normal  Mood and affect:  Normal SCODI:  OS: diffuse CME, FMA with ERM CRT  OS: 506  Impression:  362.53  Cystoid Macular Edema OS 362.56   Epiretinal Membrane OS -  -post PST Kenalog 40mg  x 08/12/13 365.11  Primary Open Angle Glaucoma OU 365.72  -Glaucoma, moderate  v43.10  Pseudophakia OS -  -post Phaco IOL OS x 03/30/2013 366.19  Combined Cataract OU -  --angles narrow OU 370.33  Dry eye, keratoconjunctivitis sicca  Plan/Treatment:  Given that her CME has been persistent with evidence of fMA and a fine ERM, I ahve recommended proceeding with PPV, MP OS to see if we can get resolution of her edema with some imporvemnet in the acuity OS. She and her husband are reminded of her having glaucoma OU which causes blind spots in her vision that may contribute to her decreased vision as well. We will assess her visual fields after her cME is stabilized.  She indicated understanding our discussion and felt that her questions had been answered to her satisfaction.  She indicates that she desires to proceed with the recommended treatment/care plan.   Medications:   Continue Current Management as directed by your PCP Patient Instructions: Please do not eat anything after mignight the day before surgery. She may take morning medications with a sip of water. Return to clinic:  July 30th/sooner PRN symptoms, 5:30 PM for post-operative follow-up  Schedule:  Pars [place]ana, Vitrectomy, Membrane Peeling OS x 12/21/2013   (electronically signed)  Shade Flood, MD

## 2013-12-20 ENCOUNTER — Encounter (HOSPITAL_COMMUNITY): Payer: Self-pay | Admitting: *Deleted

## 2013-12-20 MED ORDER — CYCLOPENTOLATE HCL 1 % OP SOLN
1.0000 [drp] | OPHTHALMIC | Status: AC | PRN
  Administered 2013-12-28 (×3): 1 [drp] via OPHTHALMIC
  Filled 2013-12-20: qty 2

## 2013-12-20 MED ORDER — PREDNISOLONE ACETATE 1 % OP SUSP: 1.0000 [drp] | OPHTHALMIC | Status: AC

## 2013-12-20 MED ORDER — PHENYLEPHRINE HCL 2.5 % OP SOLN
1.0000 [drp] | OPHTHALMIC | Status: AC | PRN
  Administered 2013-12-28 (×3): 1 [drp] via OPHTHALMIC
  Filled 2013-12-20: qty 2

## 2013-12-20 MED ORDER — GATIFLOXACIN 0.5 % OP SOLN
1.0000 [drp] | OPHTHALMIC | Status: AC | PRN
  Administered 2013-12-28 (×3): 1 [drp] via OPHTHALMIC
  Filled 2013-12-20: qty 2.5

## 2013-12-20 MED ORDER — TETRACAINE HCL 0.5 % OP SOLN: 2.0000 [drp] | OPHTHALMIC | Status: AC

## 2013-12-20 NOTE — Progress Notes (Signed)
Pt requested that I talk with her husband for the pre-op call. Mr. Zamboni verified pt's medical history. He didn't want to go over her medications, states she has too many. I strongly urged him to bring them with her in the morning, he said he would try. Also stated that she was waiting on refills from the mail order company, but he's not sure which ones she is missing. Instructed him not to give her any of her diabetic medications tomorrow morning, he voiced understanding. He understands that they need to be here at 6:30 AM for an 8:30 AM surgery.

## 2013-12-28 ENCOUNTER — Ambulatory Visit (HOSPITAL_COMMUNITY)
Admission: RE | Admit: 2013-12-28 | Discharge: 2013-12-28 | Disposition: A | Discharge status: Home / Self Care | Payer: Medicare Other | Source: Ambulatory Visit | Attending: Ophthalmology | Admitting: Ophthalmology

## 2013-12-28 ENCOUNTER — Encounter (HOSPITAL_COMMUNITY)
Admission: RE | Disposition: A | Discharge status: Home / Self Care | Payer: Self-pay | Source: Ambulatory Visit | Attending: Ophthalmology

## 2013-12-28 ENCOUNTER — Ambulatory Visit (HOSPITAL_COMMUNITY): Payer: Medicare Other | Admitting: Certified Registered Nurse Anesthetist

## 2013-12-28 ENCOUNTER — Encounter (HOSPITAL_COMMUNITY): Payer: Medicare Other | Admitting: Certified Registered Nurse Anesthetist

## 2013-12-28 ENCOUNTER — Encounter (HOSPITAL_COMMUNITY): Payer: Self-pay | Admitting: Surgery

## 2013-12-28 DIAGNOSIS — I447 Left bundle-branch block, unspecified: Secondary | ICD-10-CM | POA: Diagnosis not present

## 2013-12-28 DIAGNOSIS — Z8673 Personal history of transient ischemic attack (TIA), and cerebral infarction without residual deficits: Secondary | ICD-10-CM | POA: Insufficient documentation

## 2013-12-28 DIAGNOSIS — I509 Heart failure, unspecified: Secondary | ICD-10-CM | POA: Diagnosis not present

## 2013-12-28 DIAGNOSIS — I5030 Unspecified diastolic (congestive) heart failure: Secondary | ICD-10-CM | POA: Insufficient documentation

## 2013-12-28 DIAGNOSIS — I129 Hypertensive chronic kidney disease with stage 1 through stage 4 chronic kidney disease, or unspecified chronic kidney disease: Secondary | ICD-10-CM | POA: Insufficient documentation

## 2013-12-28 DIAGNOSIS — E1139 Type 2 diabetes mellitus with other diabetic ophthalmic complication: Secondary | ICD-10-CM | POA: Diagnosis not present

## 2013-12-28 DIAGNOSIS — E11319 Type 2 diabetes mellitus with unspecified diabetic retinopathy without macular edema: Secondary | ICD-10-CM | POA: Diagnosis not present

## 2013-12-28 DIAGNOSIS — H43319 Vitreous membranes and strands, unspecified eye: Secondary | ICD-10-CM | POA: Insufficient documentation

## 2013-12-28 DIAGNOSIS — F0390 Unspecified dementia without behavioral disturbance: Secondary | ICD-10-CM | POA: Diagnosis not present

## 2013-12-28 DIAGNOSIS — Z794 Long term (current) use of insulin: Secondary | ICD-10-CM | POA: Insufficient documentation

## 2013-12-28 DIAGNOSIS — N183 Chronic kidney disease, stage 3 unspecified: Secondary | ICD-10-CM | POA: Diagnosis not present

## 2013-12-28 DIAGNOSIS — E11311 Type 2 diabetes mellitus with unspecified diabetic retinopathy with macular edema: Secondary | ICD-10-CM | POA: Diagnosis not present

## 2013-12-28 HISTORY — PX: PARS PLANA VITRECTOMY: SHX2166

## 2013-12-28 LAB — BASIC METABOLIC PANEL
Anion gap: 16 — ABNORMAL HIGH (ref 5–15)
BUN: 26 mg/dL — ABNORMAL HIGH (ref 6–23)
CO2: 20 meq/L (ref 19–32)
CREATININE: 1.41 mg/dL — AB (ref 0.50–1.10)
Calcium: 9 mg/dL (ref 8.4–10.5)
Chloride: 105 mEq/L (ref 96–112)
GFR calc non Af Amer: 35 mL/min — ABNORMAL LOW (ref >90)
GFR, EST AFRICAN AMERICAN: 40 mL/min — AB (ref >90)
Glucose, Bld: 94 mg/dL (ref 70–99)
POTASSIUM: 4.6 meq/L (ref 3.7–5.3)
Sodium: 141 mEq/L (ref 137–147)

## 2013-12-28 LAB — GLUCOSE, CAPILLARY
GLUCOSE-CAPILLARY: 115 mg/dL — AB (ref 70–99)
Glucose-Capillary: 90 mg/dL (ref 70–99)
Glucose-Capillary: 105 mg/dL — ABNORMAL HIGH (ref 70–99)

## 2013-12-28 LAB — CBC
HCT: 26.8 % — ABNORMAL LOW (ref 36.0–46.0)
Hemoglobin: 7.9 g/dL — ABNORMAL LOW (ref 12.0–15.0)
MCH: 21.1 pg — ABNORMAL LOW (ref 26.0–34.0)
MCHC: 29.5 g/dL — ABNORMAL LOW (ref 30.0–36.0)
MCV: 71.5 fL — AB (ref 78.0–100.0)
PLATELETS: 258 10*3/uL (ref 150–400)
RBC: 3.75 MIL/uL — AB (ref 3.87–5.11)
RDW: 19.7 % — AB (ref 11.5–15.5)
WBC: 5.9 10*3/uL (ref 4.0–10.5)

## 2013-12-28 SURGERY — PARS PLANA VITRECTOMY WITH 23 GAUGE
Anesthesia: Monitor Anesthesia Care | Site: Eye | Laterality: Left

## 2013-12-28 MED ORDER — VANCOMYCIN SUBCONJUNCTIVAL INJECTION 25 MG/0.5 ML
25.0000 mg | INTRAOCULAR | Status: DC
  Filled 2013-12-28: qty 0.5

## 2013-12-28 MED ORDER — LIDOCAINE HCL 2 % IJ SOLN
INTRAMUSCULAR | Status: AC
  Filled 2013-12-28: qty 20

## 2013-12-28 MED ORDER — EPINEPHRINE HCL 1 MG/ML IJ SOLN
INTRAMUSCULAR | Status: AC
  Filled 2013-12-28: qty 1

## 2013-12-28 MED ORDER — DEXAMETHASONE SODIUM PHOSPHATE 10 MG/ML IJ SOLN
INTRAMUSCULAR | Status: AC
  Filled 2013-12-28: qty 1

## 2013-12-28 MED ORDER — SODIUM HYALURONATE 10 MG/ML IO SOLN
INTRAOCULAR | Status: AC
  Filled 2013-12-28: qty 0.85

## 2013-12-28 MED ORDER — DEXAMETHASONE SODIUM PHOSPHATE 10 MG/ML IJ SOLN
INTRAMUSCULAR | Status: DC | PRN
  Administered 2013-12-28: 10 mg

## 2013-12-28 MED ORDER — TETRACAINE HCL 0.5 % OP SOLN
OPHTHALMIC | Status: DC | PRN
  Administered 2013-12-28: 1 [drp] via OPHTHALMIC

## 2013-12-28 MED ORDER — EPINEPHRINE HCL 1 MG/ML IJ SOLN
INTRAOCULAR | Status: DC | PRN
  Administered 2013-12-28: 14:00:00

## 2013-12-28 MED ORDER — TRIAMCINOLONE ACETONIDE 40 MG/ML IJ SUSP
INTRAMUSCULAR | Status: AC
  Filled 2013-12-28: qty 5

## 2013-12-28 MED ORDER — VANCOMYCIN SUBCONJUNCTIVAL INJECTION 25 MG/0.5 ML
INTRAOCULAR | Status: DC | PRN
  Administered 2013-12-28: 50 mg via SUBCONJUNCTIVAL

## 2013-12-28 MED ORDER — SODIUM CHLORIDE 0.9 % IV SOLN
INTRAVENOUS | Status: DC
  Administered 2013-12-28: 11:00:00 via INTRAVENOUS

## 2013-12-28 MED ORDER — ACETAZOLAMIDE SODIUM 500 MG IJ SOLR
INTRAMUSCULAR | Status: AC
  Filled 2013-12-28: qty 500

## 2013-12-28 MED ORDER — HYPROMELLOSE (GONIOSCOPIC) 2.5 % OP SOLN
OPHTHALMIC | Status: DC | PRN
  Administered 2013-12-28: 2 [drp] via OPHTHALMIC

## 2013-12-28 MED ORDER — BUPIVACAINE HCL (PF) 0.75 % IJ SOLN
INTRAMUSCULAR | Status: AC
  Filled 2013-12-28: qty 10

## 2013-12-28 MED ORDER — BSS PLUS IO SOLN
INTRAOCULAR | Status: AC
  Filled 2013-12-28: qty 500

## 2013-12-28 MED ORDER — BACITRACIN-POLYMYXIN B 500-10000 UNIT/GM OP OINT
TOPICAL_OINTMENT | OPHTHALMIC | Status: AC
  Filled 2013-12-28: qty 3.5

## 2013-12-28 MED ORDER — NA CHONDROIT SULF-NA HYALURON 40-30 MG/ML IO SOLN
INTRAOCULAR | Status: AC
  Filled 2013-12-28: qty 0.5

## 2013-12-28 MED ORDER — BSS IO SOLN
INTRAOCULAR | Status: DC | PRN
  Administered 2013-12-28: 15 mL via INTRAOCULAR

## 2013-12-28 MED ORDER — ISOSORBIDE MONONITRATE ER 60 MG PO TB24
60.0000 mg | ORAL_TABLET | Freq: Once | ORAL | Status: AC
  Administered 2013-12-28: 60 mg via ORAL
  Filled 2013-12-28: qty 1

## 2013-12-28 MED ORDER — HYPROMELLOSE (GONIOSCOPIC) 2.5 % OP SOLN
OPHTHALMIC | Status: AC
  Filled 2013-12-28: qty 15

## 2013-12-28 MED ORDER — METOPROLOL TARTRATE 12.5 MG HALF TABLET
ORAL_TABLET | ORAL | Status: AC
  Filled 2013-12-28: qty 2

## 2013-12-28 MED ORDER — METOPROLOL TARTRATE 25 MG PO TABS
25.0000 mg | ORAL_TABLET | Freq: Once | ORAL | Status: AC
  Administered 2013-12-28: 25 mg via ORAL

## 2013-12-28 MED ORDER — HYDRALAZINE HCL 25 MG PO TABS
25.0000 mg | ORAL_TABLET | Freq: Once | ORAL | Status: AC
  Administered 2013-12-28: 25 mg via ORAL
  Filled 2013-12-28: qty 1

## 2013-12-28 MED ORDER — TRIAMCINOLONE ACETONIDE 40 MG/ML IJ SUSP
INTRAMUSCULAR | Status: DC | PRN
  Administered 2013-12-28: 40 mg

## 2013-12-28 MED ORDER — TETRACAINE HCL 0.5 % OP SOLN
OPHTHALMIC | Status: AC
  Filled 2013-12-28: qty 2

## 2013-12-28 MED ORDER — BACITRACIN-POLYMYXIN B 500-10000 UNIT/GM OP OINT
TOPICAL_OINTMENT | OPHTHALMIC | Status: DC | PRN
  Administered 2013-12-28: 1 via OPHTHALMIC

## 2013-12-28 MED ORDER — LIDOCAINE HCL 2 % IJ SOLN
INTRAMUSCULAR | Status: DC | PRN
  Administered 2013-12-28: 14:00:00 via RETROBULBAR

## 2013-12-28 MED ORDER — PROPOFOL 10 MG/ML IV BOLUS
INTRAVENOUS | Status: DC | PRN
  Administered 2013-12-28: 50 mg via INTRAVENOUS

## 2013-12-28 SURGICAL SUPPLY — 83 items
APL SRG 3 HI ABS STRL LF PLS (MISCELLANEOUS)
APPLICATOR COTTON TIP 6IN STRL (MISCELLANEOUS) ×3 IMPLANT
APPLICATOR DR MATTHEWS STRL (MISCELLANEOUS) IMPLANT
BAG FLD CLT MN 6.25X3.5 (WOUND CARE) ×1
BAG MINI COLL DRAIN (WOUND CARE) ×3 IMPLANT
BLADE EYE MINI 60D BEAVER (BLADE) IMPLANT
BLADE KERATOME 2.75 (BLADE) IMPLANT
BLADE KERATOME 2.75MM (BLADE)
BLADE MVR KNIFE 19G (BLADE) IMPLANT
BLADE STAB KNIFE 15DEG (BLADE) IMPLANT
CANNULA DUAL BORE 23G (CANNULA) IMPLANT
CANNULA VLV SOFT TIP 23GA (OPHTHALMIC) ×3 IMPLANT
CORDS BIPOLAR (ELECTRODE) IMPLANT
COVER MAYO STAND STRL (DRAPES) ×3 IMPLANT
DRAPE OPHTHALMIC 77X100 STRL (CUSTOM PROCEDURE TRAY) ×3 IMPLANT
DRAPE POUCH INSTRU U-SHP 10X18 (DRAPES) ×3 IMPLANT
DRSG TEGADERM 4X4.75 (GAUZE/BANDAGES/DRESSINGS) ×3 IMPLANT
ERASER HMR WETFIELD 23G BP (MISCELLANEOUS) IMPLANT
FILTER BLUE MILLIPORE (MISCELLANEOUS) IMPLANT
FORCEPS GRIESHABER ILM 25G A (INSTRUMENTS) IMPLANT
GAS AUTO FILL CONSTEL (OPHTHALMIC) ×3
GAS AUTO FILL CONSTELLATION (OPHTHALMIC) IMPLANT
GLOVE BIOGEL PI IND STRL 6.5 (GLOVE) IMPLANT
GLOVE BIOGEL PI INDICATOR 6.5 (GLOVE) ×2
GLOVE SS BIOGEL STRL SZ 6.5 (GLOVE) ×1 IMPLANT
GLOVE SUPERSENSE BIOGEL SZ 6.5 (GLOVE) ×2
GLOVE SURG SS PI 7.0 STRL IVOR (GLOVE) ×2 IMPLANT
GLOVE SURG SS PI 7.5 STRL IVOR (GLOVE) ×2 IMPLANT
GOWN STRL REUS W/ TWL LRG LVL3 (GOWN DISPOSABLE) ×1 IMPLANT
GOWN STRL REUS W/ TWL XL LVL3 (GOWN DISPOSABLE) ×1 IMPLANT
GOWN STRL REUS W/TWL LRG LVL3 (GOWN DISPOSABLE) ×3
GOWN STRL REUS W/TWL XL LVL3 (GOWN DISPOSABLE) ×3
HANDLE PNEUMATIC FOR CONSTEL (OPHTHALMIC) IMPLANT
ILLUMINATOR WD SAPHIRE 23G (OPHTHALMIC) ×3 IMPLANT
KIT BASIN OR (CUSTOM PROCEDURE TRAY) ×3 IMPLANT
KIT ROOM TURNOVER OR (KITS) ×3 IMPLANT
KNIFE CRESCENT 1.75 EDGEAHEAD (BLADE) IMPLANT
KNIFE GRIESHABER SHARP 2.5MM (MISCELLANEOUS) IMPLANT
LOOP FINESSE 23 GA (MISCELLANEOUS) ×2 IMPLANT
NDL 18GX1X1/2 (RX/OR ONLY) (NEEDLE) ×1 IMPLANT
NDL 25GX 5/8IN NON SAFETY (NEEDLE) ×1 IMPLANT
NDL 27GX1/2 REG BEVEL ECLIP (NEEDLE) ×1 IMPLANT
NDL FILTER BLUNT 18X1 1/2 (NEEDLE) ×1 IMPLANT
NDL HYPO 30X.5 LL (NEEDLE) ×2 IMPLANT
NEEDLE 18GX1X1/2 (RX/OR ONLY) (NEEDLE) ×3 IMPLANT
NEEDLE 22X1 1/2 (OR ONLY) (NEEDLE) IMPLANT
NEEDLE 25GX 5/8IN NON SAFETY (NEEDLE) ×3 IMPLANT
NEEDLE 27GX1/2 REG BEVEL ECLIP (NEEDLE) ×3 IMPLANT
NEEDLE FILTER BLUNT 18X 1/2SAF (NEEDLE) ×2
NEEDLE FILTER BLUNT 18X1 1/2 (NEEDLE) ×1 IMPLANT
NEEDLE HYPO 30X.5 LL (NEEDLE) ×6 IMPLANT
NS IRRIG 1000ML POUR BTL (IV SOLUTION) ×3 IMPLANT
PACK FRAGMATOME (OPHTHALMIC) IMPLANT
PACK VITRECTOMY CUSTOM (CUSTOM PROCEDURE TRAY) ×3 IMPLANT
PAD ARMBOARD 7.5X6 YLW CONV (MISCELLANEOUS) ×4 IMPLANT
PAD EYE OVAL STERILE LF (GAUZE/BANDAGES/DRESSINGS) IMPLANT
PAK VITRECTOMY PIK  23GA (OPHTHALMIC RELATED) ×3 IMPLANT
PENCIL BIPOLAR 25GA STR DISP (OPHTHALMIC RELATED) IMPLANT
PROBE LASER ILLUM FLEX CVD 23G (OPHTHALMIC) IMPLANT
ROLLS DENTAL (MISCELLANEOUS) ×2 IMPLANT
SCISSORS TIP ADVANCED DSP 25GA (INSTRUMENTS) IMPLANT
SCISSORS TIP GREISHABER 23 VER (OPHTHALMIC) IMPLANT
SCRAPER DIAMOND DUST MEMBRANE (MISCELLANEOUS) IMPLANT
SPEAR EYE SURG WECK-CEL (MISCELLANEOUS) ×5 IMPLANT
SUT ETHILON 10 0 CS140 6 (SUTURE) IMPLANT
SUT ETHILON 4 0 P 3 18 (SUTURE) IMPLANT
SUT ETHILON 5 0 P 3 18 (SUTURE)
SUT ETHILON 9 0 TG140 8 (SUTURE) IMPLANT
SUT NYLON ETHILON 5-0 P-3 1X18 (SUTURE) IMPLANT
SUT PLAIN 6 0 TG1408 (SUTURE) IMPLANT
SUT POLY NON ABSORB 10-0 8 STR (SUTURE) IMPLANT
SUT VICRYL 7 0 TG140 8 (SUTURE) IMPLANT
SUT VICRYL ABS 6-0 S29 18IN (SUTURE) IMPLANT
SYR 20CC LL (SYRINGE) ×3 IMPLANT
SYR 5ML LL (SYRINGE) IMPLANT
SYR TB 1ML LUER SLIP (SYRINGE) ×3 IMPLANT
SYRINGE 10CC LL (SYRINGE) IMPLANT
TAPE PAPER MEDFIX 1IN X 10YD (GAUZE/BANDAGES/DRESSINGS) ×2 IMPLANT
TOWEL OR 17X24 6PK STRL BLUE (TOWEL DISPOSABLE) ×7 IMPLANT
TUBE EXTENSION HAMMER (TUBING) IMPLANT
WATER STERILE IRR 1000ML POUR (IV SOLUTION) ×3 IMPLANT
WIPE INSTRUMENT ADHESIVE BACK (MISCELLANEOUS) ×3 IMPLANT
WIPE INSTRUMENT VISIWIPE 73X73 (MISCELLANEOUS) ×3 IMPLANT

## 2013-12-28 NOTE — Transfer of Care (Signed)
Immediate Anesthesia Transfer of Care Note  Patient: Glenda Gonzalez  Procedure(s) Performed: Procedure(s): PARS PLANA VITRECTOMY WITH 23 GAUGE LEFT EYE  (Left)  Patient Location: PACU  Anesthesia Type:MAC  Level of Consciousness: awake, alert  and oriented  Airway & Oxygen Therapy: Patient Spontanous Breathing  Post-op Assessment: Report given to PACU RN and Post -op Vital signs reviewed and stable  Post vital signs: Reviewed and stable  Complications: No apparent anesthesia complications

## 2013-12-28 NOTE — Anesthesia Preprocedure Evaluation (Signed)
Anesthesia Evaluation  Patient identified by MRN, date of birth, ID band Patient awake    Reviewed: Allergy & Precautions, H&P , NPO status , Patient's Chart, lab work & pertinent test results  Airway Mallampati: I TM Distance: >3 FB Neck ROM: Full    Dental   Pulmonary former smoker,          Cardiovascular hypertension, Pt. on medications +CHF     Neuro/Psych CVA    GI/Hepatic GERD-  Controlled,  Endo/Other  diabetes, Type 2  Renal/GU CRFRenal disease     Musculoskeletal   Abdominal   Peds  Hematology   Anesthesia Other Findings   Reproductive/Obstetrics                           Anesthesia Physical Anesthesia Plan  ASA: III  Anesthesia Plan: MAC   Post-op Pain Management:    Induction: Intravenous  Airway Management Planned: Natural Airway  Additional Equipment:   Intra-op Plan:   Post-operative Plan:   Informed Consent: I have reviewed the patients History and Physical, chart, labs and discussed the procedure including the risks, benefits and alternatives for the proposed anesthesia with the patient or authorized representative who has indicated his/her understanding and acceptance.     Plan Discussed with: CRNA and Surgeon  Anesthesia Plan Comments:         Anesthesia Quick Evaluation

## 2013-12-28 NOTE — Anesthesia Postprocedure Evaluation (Signed)
Anesthesia Post Note  Patient: Glenda Gonzalez  Procedure(s) Performed: Procedure(s) (LRB): PARS PLANA VITRECTOMY WITH 23 GAUGE LEFT EYE  (Left)  Anesthesia type: general  Patient location: PACU  Post pain: Pain level controlled  Post assessment: Patient's Cardiovascular Status Stable  Last Vitals:  Filed Vitals:   12/28/13 1445  BP: 130/72  Pulse: 59  Temp:   Resp: 17    Post vital signs: Reviewed and stable  Level of consciousness: sedated  Complications: No apparent anesthesia complications

## 2013-12-28 NOTE — Progress Notes (Signed)
Nurse called Dr. Michelle Piper and informed him of patients blood pressure being 209/74 and HR 83, and that patient did not take any of her blood pressure medications. Dr. Michelle Piper ordered for patient to take scheduled home dose of Isosorbide 60 mg and Hydralazine 25 mg. Will enter orders and administer.

## 2013-12-28 NOTE — Discharge Instructions (Signed)
WRIGLEY REISIG      12/28/2013  Post-operative instructions for Waynette Buttery L. Clarisa Kindred, MD  Caring for your eye:  Do not rub your eye and wash your hands before touching the eye area. This is important to avoid injury and infection.  You may use sterile gauze pads and sterile eye wash to cleanse the lid margins of mucous accumulation.  DO NOT REUSE GAUZE after wiping the eye. Use a new clean one if needed.  Be certain not to touch the top of the medication bottle to the eyelids to avoid contaminating your medicine bottle and causing infection.  After eye surgery the surface of the eye and eyelids may be puffy. You may note a red blotch(s) on the surface of the eye or a bruise on your eyelid. These are usually related to injections or instruments used in surgery and are not cause for alarm. You may also notice blood tinged tears on your eye pad, this is common and not cause for alarm. These findings will subside over the coming week or two.  Activity:  No jarring activities. Walking with assistance early on as needed is advised. Avoid straining and let me know if you have significant constipation. Do not bend over at the waist with the head below your waist to minimize risk of bleeding inside the eye.  Avoid getting water from washing your hair or showering  in your eye. Patch the eye if necessary during bathing to avoid contamination.    You may: watch television, work on you computer, read books, eat out, ride in a car.  Sleeping Position:    ** You may sleep on either side.    **  If you DO have a gas bubble in your eye, DO NOT SLEEP ON YOUR BACK!!!              Doing so can cause a very high eye pressure and cause eye injury.    DO NOT travel in an airplane or drive to altitude above 3664 ft above sea level if you have a gas bubble in your eye. This also can cause very high eye pressure.  Wear your eye shield for naps and sleeping at night for the first two weeks.   Eye Medication:   Zymaxid 1 gtt in operative eye four times daily.                               Prednisolobne Acetate 1% 1 gtt in operative  Eye four times daily  Wait a few minutes between your eye drops when placing them.   Resume your customary medications on your normal schedule.   Shade Flood MD    After office hours I can be reached by calling:    [phone]

## 2013-12-28 NOTE — Op Note (Signed)
Diagnosis: Epi-retinal Membrane, Diabetic Exudative Macular Edema Procedure: Pars Plana Vitrectomy, Membrane Peeling and Fluid Gas Exchange Operative Eye:  left eye  Surgeon: Shade Flood Estimated Blood Loss: minimal Specimens for Pathology:  None Complications: none   The  patient was prepped and draped in the usual fashion for ocular surgery on the  left eye .  A solid lid speculum was placed. The conjunctiva was displaced with a cotton tipped applicator at the  4:30  meridian. A trocar/cannula was placed 3.5 mm from the surgical limbus. The cannula was visualized in the vitreous cavity. The infusion line was allowed to run and then clamped when placed at the cannula opening. The line was inserted and secured to the drape with an adhesive strip. Trocar/Cannulas were then placed at the 9:30 and 2:30 meridian. The light pipe and vitreous cutter were inserted into the vitreous cavity and the wide field lens was placed. The posterior vitreous face was engaged with the vitreous cutter on suction mode. The posterior hyaloid was separated from the retina in that fashion and brought to the mid vitreous cavity.  Core vitrectomy was carried out uneventfully with care taken to remove the vitreous up to the vitreous base for 360 degrees.    Triamcinolone Acetate was  placed on the macular surface and was removed immediately.  An Alcon Finesse loop  was used to elevate the ILM and overlying ERM and the 23g pick forceps were used to peel the ILM in a circumferential manner toward the fovea. The patient had a large central  foveal cyst so total air fluid exchange was carried out. SF6 gas was mixed by the surgeon at 20% concentration. A total of 50cc SF6 at that concentration was passed through the eye to insure uniform concentration. A 27g needle was used for egress.  The cannulas were removed from the 9:30 and 2:30 positions with concommitant tamponade using a cotton tipped applicator. Subconjunctival injections  of Ancef 100mg /0.40ml and Dexamethasone 4mg /28ml were placed in the infero-medial quadrant to avoid proximity to the cannula sites.   The infusion cannula was removed with concomitant tamponade with the cotton tipped applicator leaving the ocular pressure less than 20 by palpation.  The speculum and drapes were removed and the eye was patched with Polymixin/Bacitracin ophthalmic ointment. An eye shield was placed and the patient was transferred alert and conversant with stable vital signs to the post operative recovery area.  Shade Flood MD

## 2013-12-28 NOTE — Anesthesia Procedure Notes (Signed)
Procedure Name: MAC Date/Time: 12/28/2013 1:11 PM Performed by: Margaree Mackintosh Pre-anesthesia Checklist: Patient identified, Emergency Drugs available, Suction available, Patient being monitored and Timeout performed Patient Re-evaluated:Patient Re-evaluated prior to inductionOxygen Delivery Method: Nasal cannula Placement Confirmation: positive ETCO2 and breath sounds checked- equal and bilateral Dental Injury: Teeth and Oropharynx as per pre-operative assessment

## 2013-12-28 NOTE — H&P (Signed)
Glenda Gonzalez                                                                                         12/28/2013                                               Ophthalmology Evaluation                                           HPI: The patient has chronic, non=clearing Maculae Edema OS  Pertinent Medical History: as below  Active Ambulatory Problems    Diagnosis Date Noted  . DM 04/24/2009  . DEMENTIA, MILD 04/24/2009  . HYPERTENSION 04/24/2009  . C. difficile colitis 07/23/2011  . CVA (cerebral infarction) 06/16/2012  . ARF (acute renal failure) 07/09/2012  . ACE inhibitor-aggravated angioedema 07/18/2012  . Acute respiratory failure with hypoxia 07/18/2012  . Anemia of chronic disease 07/25/2012  . Left arm pain 10/05/2012  . DKA (diabetic ketoacidoses) 05/24/2013  . Diarrhea 05/24/2013  . LBBB (left bundle branch block) 05/24/2013  . Cough 05/24/2013  . CHF (congestive heart failure) 05/24/2013  . Influenza A with respiratory manifestations 05/25/2013  . CKD (chronic kidney disease), stage III 05/25/2013  . Acute encephalopathy 10/14/2013  . UTI (lower urinary tract infection) 10/14/2013  . Diastolic CHF 10/14/2013  . Elevated cholesterol with high triglycerides 10/16/2013   Resolved Ambulatory Problems    Diagnosis Date Noted  . GOUT 04/24/2009  . TREMOR, ESSENTIAL 04/24/2009  . CAD 04/25/2009  . CONGESTIVE HEART FAILURE UNSPECIFIED 04/24/2009  . CLAUDICATION 04/25/2009  . GERD 04/24/2009  . OSTEOARTHRITIS 04/24/2009  . Shortness of breath 04/25/2009  . CHEST PAIN 04/25/2009  . Generalized weakness 07/20/2011  . Fall 07/20/2011  . Thrombocytopenia 07/20/2011  . Hypokalemia 07/20/2011  . Hyponatremia 07/20/2011  . Acute diarrhea 07/20/2011  . UTI (lower urinary tract infection) 07/23/2011  . Hypoglycemia 07/23/2011  . Acute encephalopathy 06/09/2012  . Acute respiratory failure 06/09/2012  . Status epilepticus 06/09/2012  . Hyperglycemia 06/09/2012  .  Enteritis 07/09/2012  . Nausea & vomiting 07/09/2012  . Diarrhea 07/09/2012  . Abdominal pain, diffuse 07/09/2012  . Seizures 07/25/2012   Past Medical History  Diagnosis Date  . GERD (gastroesophageal reflux disease)   . Hypertension   . Diverticulitis   . Gout   . Dementia   . CAD (coronary artery disease)   . Anemia, chronic disease   . Pneumonia   . Acute renal failure (ARF)   . Arthritis   . Diabetes mellitus   . Stroke 2014    Pertinent Ophthalmic History:  Diabetic Retinopathy, Macular Edema and post Cataract Surgery   Medications Prior to Admission  Medication Sig Dispense Refill  . albuterol (PROVENTIL HFA;VENTOLIN HFA) 108 (90 BASE) MCG/ACT inhaler Inhale 2 puffs into the lungs every 6 (six) hours as needed for wheezing or shortness of breath.  1  Inhaler  2  . donepezil (ARICEPT) 10 MG tablet Take 10 mg by mouth at bedtime.      Marland Kitchen. esomeprazole (NEXIUM) 40 MG capsule Take 40 mg by mouth 2 (two) times daily.      . hydrALAZINE (APRESOLINE) 25 MG tablet Take 1 tablet (25 mg total) by mouth every 8 (eight) hours.  90 tablet  0  . insulin aspart (NOVOLOG FLEXPEN) 100 UNIT/ML FlexPen Inject 2 Units into the skin 3 (three) times daily with meals.  15 mL  0  . Insulin Glargine (LANTUS) 100 UNIT/ML Solostar Pen Inject 18 Units into the skin daily at 10 pm.  15 mL  0  . isosorbide mononitrate (IMDUR) 60 MG 24 hr tablet Take 1 tablet (60 mg total) by mouth daily.  30 tablet  0  . ketorolac (ACULAR) 0.4 % SOLN Place 1 drop into both eyes 4 (four) times daily.      . metoprolol tartrate (LOPRESSOR) 25 MG tablet Take 1 tablet (25 mg total) by mouth 2 (two) times daily.  60 tablet  0  . niacin 500 MG tablet Take 1 tablet (500 mg total) by mouth at bedtime.  30 tablet  0  . nitroGLYCERIN (NITROSTAT) 0.4 MG SL tablet Place 0.4 mg under the tongue every 5 (five) minutes as needed for chest pain.      . prednisoLONE acetate (PRED FORTE) 1 % ophthalmic suspension Place 1 drop into both  eyes 4 (four) times daily.      . pregabalin (LYRICA) 50 MG capsule Take 50 mg by mouth 3 (three) times daily.      . bimatoprost (LUMIGAN) 0.01 % SOLN Place 1 drop into both eyes 2 (two) times daily.       . diphenhydrAMINE (BENADRYL) 25 mg capsule Take 25 mg by mouth at bedtime as needed for itching or sleep.       . Insulin Pen Needle 29G X 12.7MM MISC 1 Device by Does not apply route 4 (four) times daily.  30 each  0  . Polyethyl Glycol-Propyl Glycol (SYSTANE ULTRA OP) Place 1 drop into both eyes daily as needed. For dry eyes        Ophthalmic ROS: blurred vision OS    Confrontation Visual Fields:   OD: full, normal    OS: full, normal    Pupils:   OD: PERRLA  OS: PERRLA  VA:          Near acuity:  cc OD  20/50      cc  OS 20/ 200                                 Intraocular Pressure:                      OD:   Arly.Keller[X  ]  Deferred      OS:   Arly.Keller[X  ]  Deferred      Lids/Lashes:     OD:  normal    OS:  normal  Conjunctiva:    OD: normal    OS: normal   Sclera:    OD: normal      OS: normal    Cornea:    OD: normal   OS: normal  Anterior Chamber:    OU: deep & clear  Iris:    OD: rubeosis absent     OS: rubeosis absent    Lens:    OD: post phoco IOL      OS: post Phaco IOL       Vitreous:   OD: clear     OS: clear   Peripheral Retina:    OD: normal    OS: normal   Macula: OD: mild BDR OS: mild BDR, ERM, CME  Retina Vessels: OD: mild BDR OS: mild BDR   Impression:   Persistent rertinal edema OS,  Epiretinal Membrane    Discussion: Discuessd her persistent decreased vision OS and have recommended Vitrectomy with Membrane Peeling OS to resolve her edema  Recommendations/Plan:  Pars Plana Vitrectomy, Membrane Peeling   Shade Flood MD

## 2013-12-29 ENCOUNTER — Encounter (HOSPITAL_COMMUNITY): Payer: Self-pay | Admitting: Ophthalmology

## 2014-01-18 IMAGING — CR DG FEMUR 2V*L*
5 series · 5 of 5 positions shown · non-contrast
Comparison: None.

CLINICAL DATA: Fall

LEFT FEMUR - 2 VIEW

[t femur proximal ap left (1 of 2)]
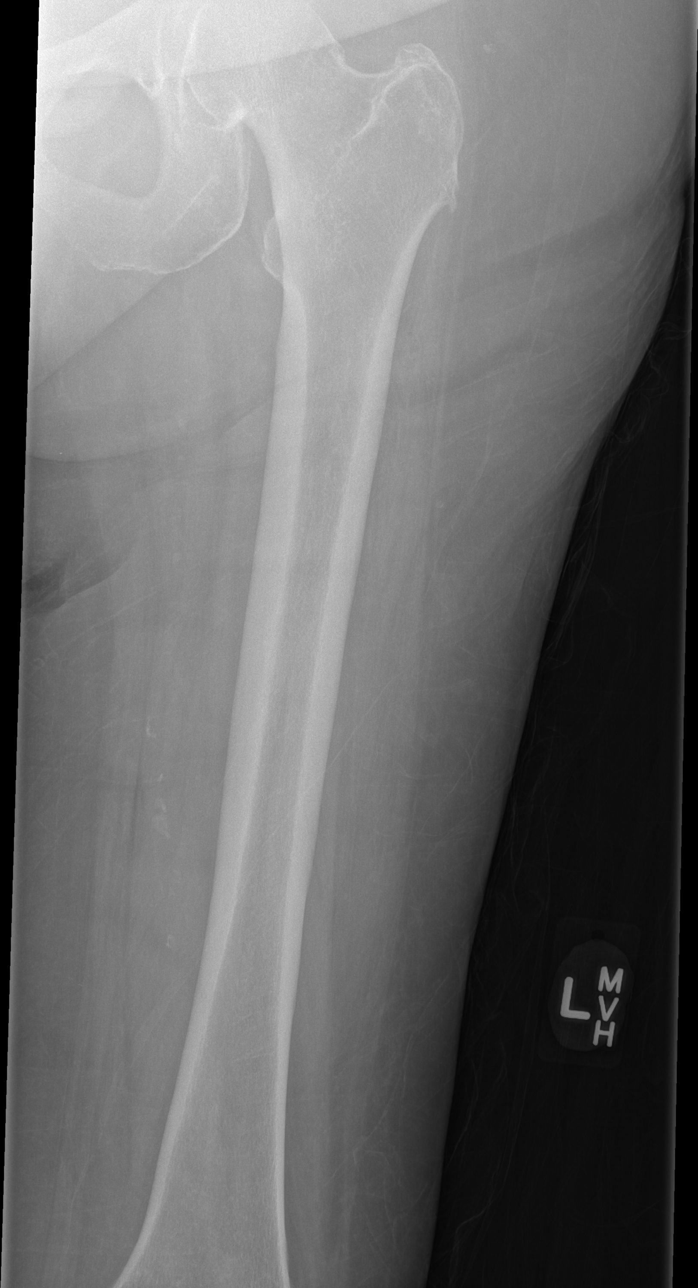

[t femur proximal ap left (2 of 2)]
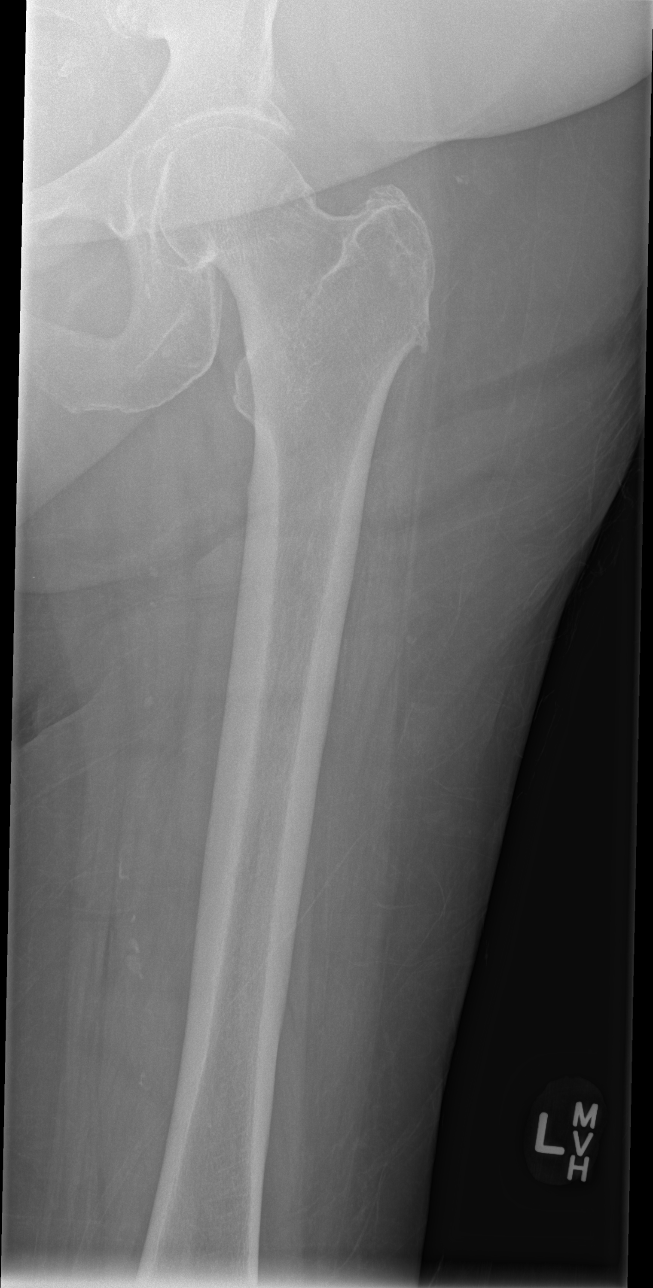

[t femur distal ap left]
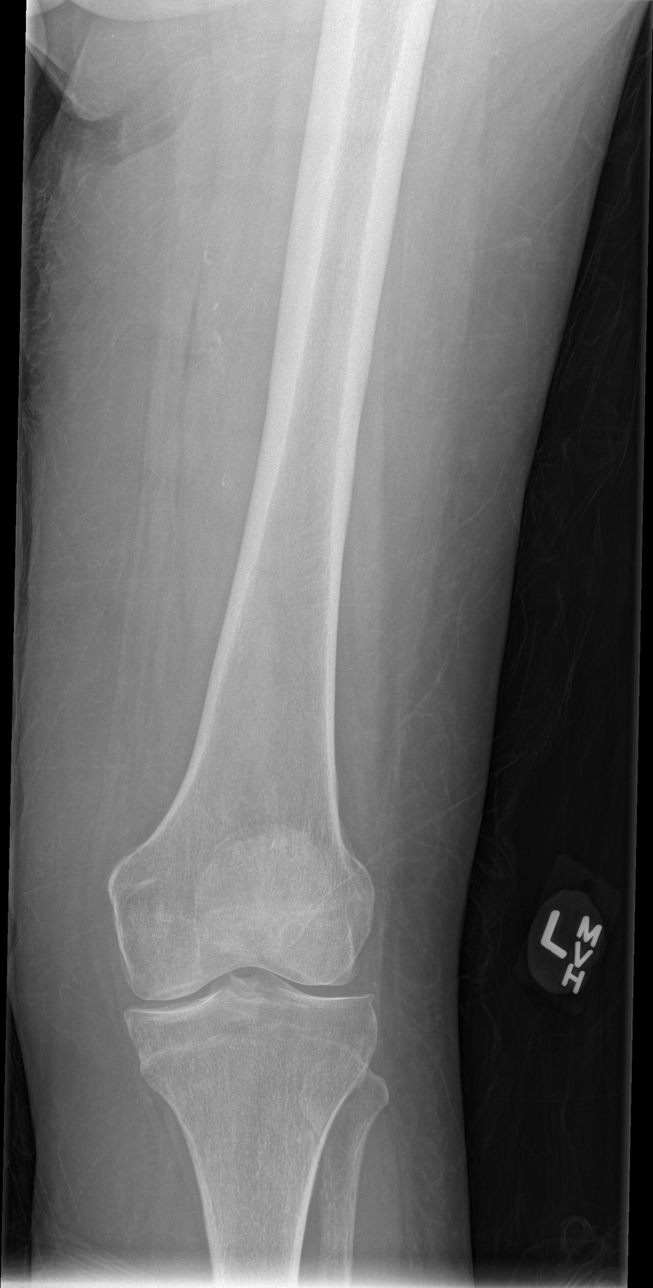

[t femur distal lat left]
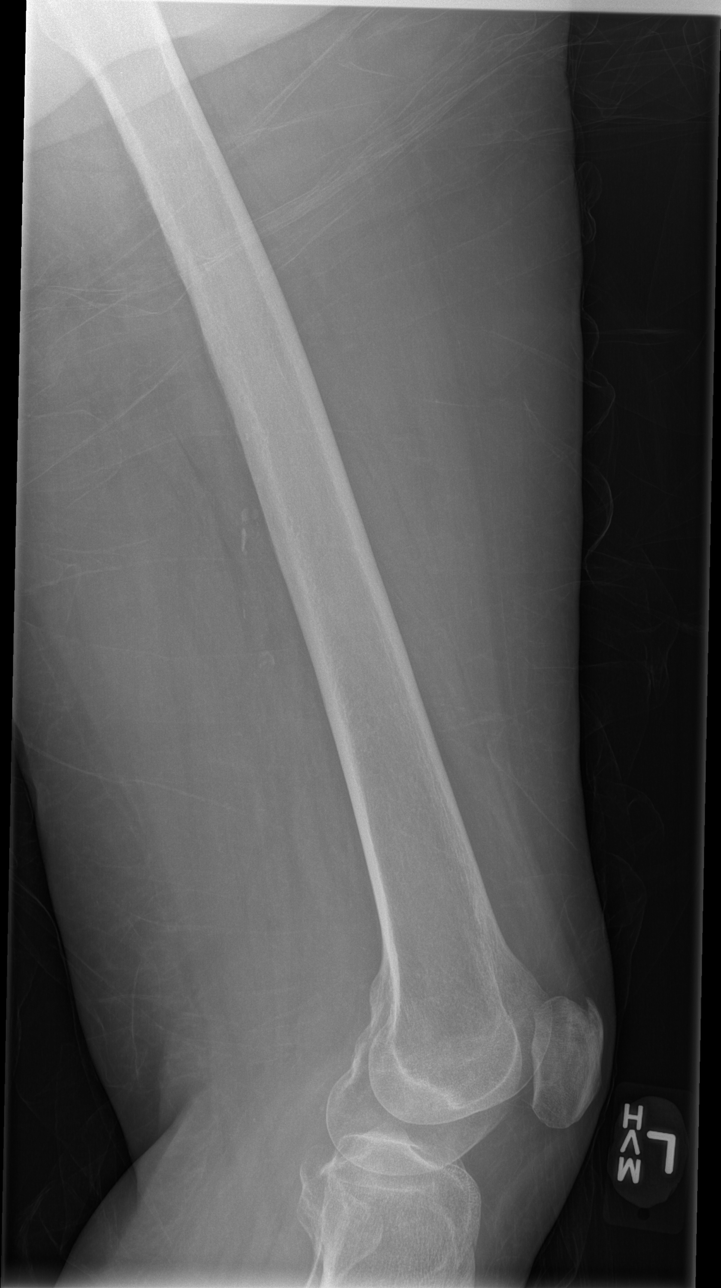

[t femur proximal lat left]
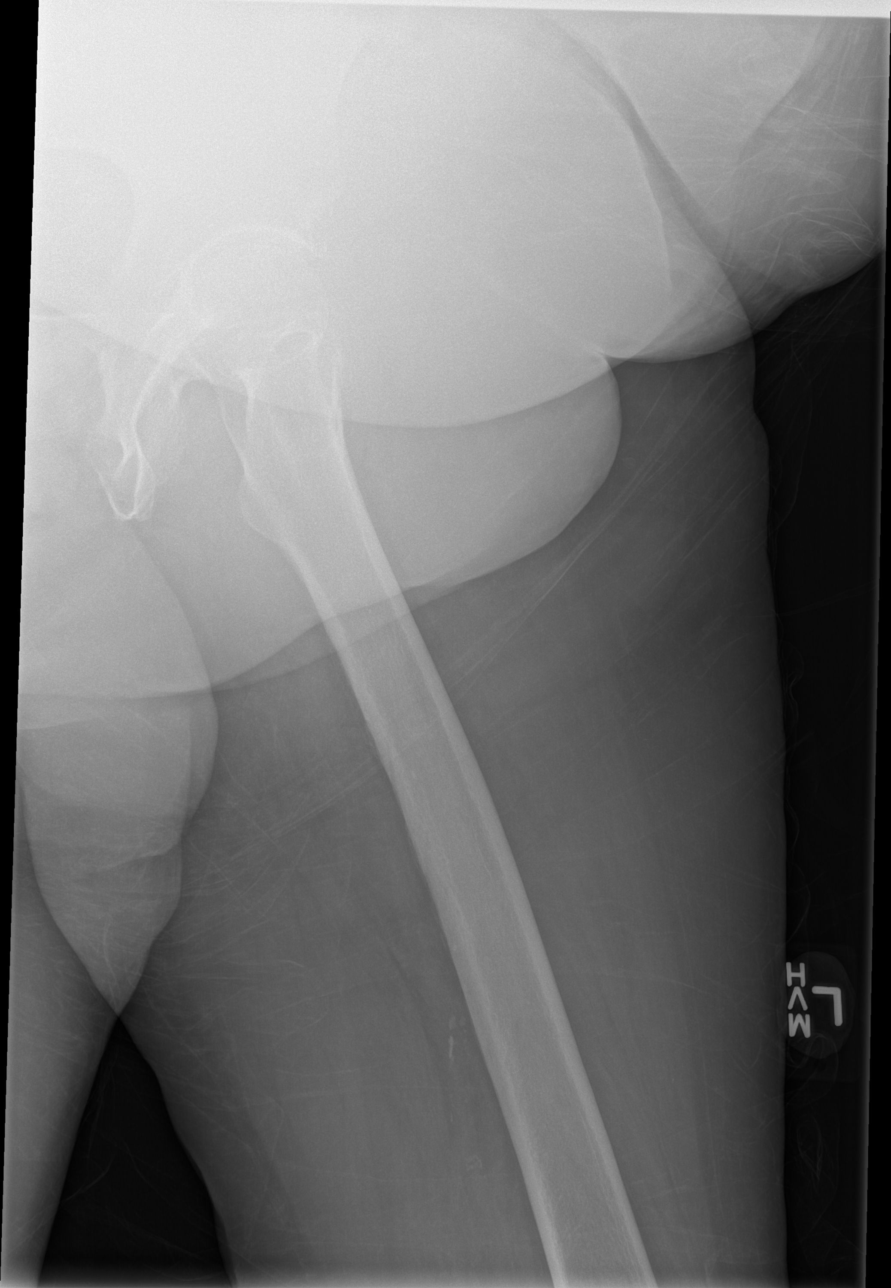

[5 of 5 positions shown; findings below may reference images not displayed]

FINDINGS: No acute fracture and no dislocation.  Unremarkable soft
tissues.
IMPRESSION: No acute bony pathology.

## 2014-01-18 IMAGING — CT CT HEAD W/O CM
2 series · 17 of 30 positions shown, 20 images · non-contrast
Comparison: 04/02/2010

CLINICAL DATA: 77-year-old female with altered mental status
following fall.  Also unable to stand.

CT HEAD WITHOUT CONTRAST
TECHNIQUE: Contiguous axial images were obtained from the base of
the skull through the vertex without contrast.

[Series 2: head w/o · axial · non-contrast · 0.43mm/px · z∈[-115,+0]mm · 9 of 29 slices shown, 12 images]
[im 3/29  brain]
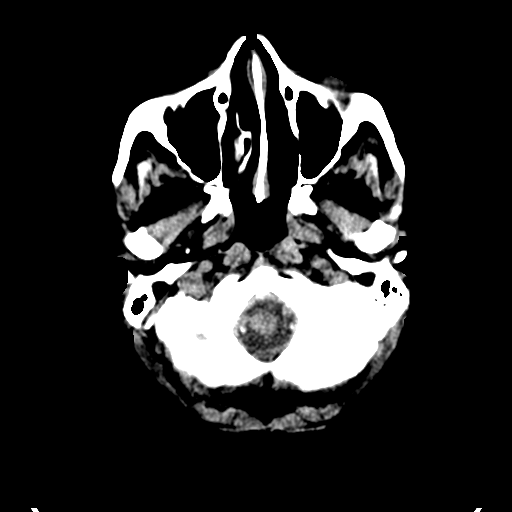
[im 3/29  bone]
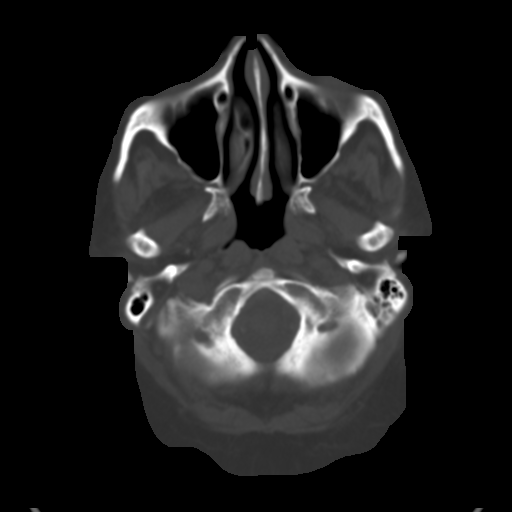
[im 6/29  brain]
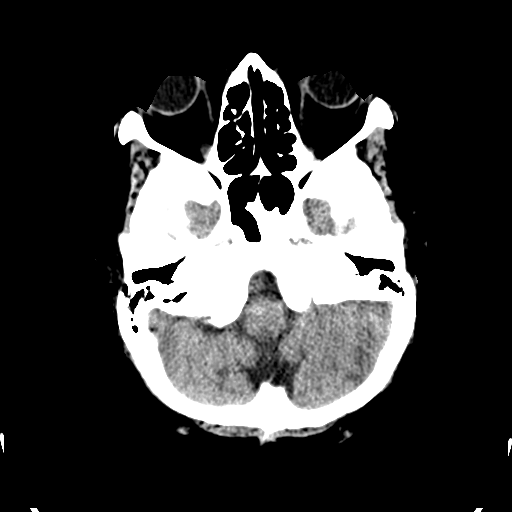
[im 9/29  brain]
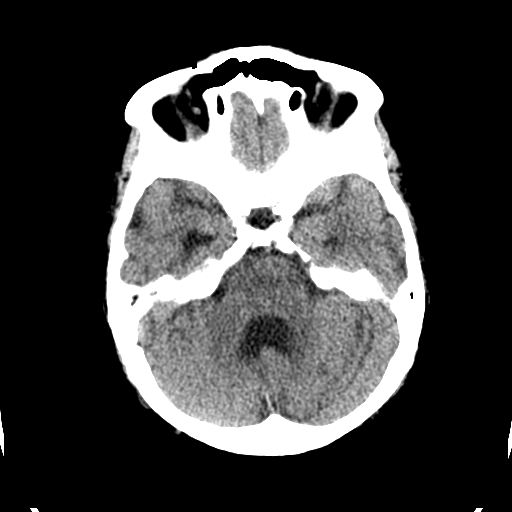
[im 12/29  brain]
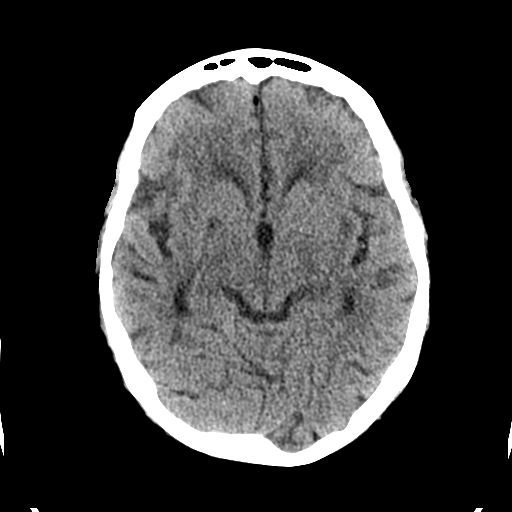
[im 15/29  brain]
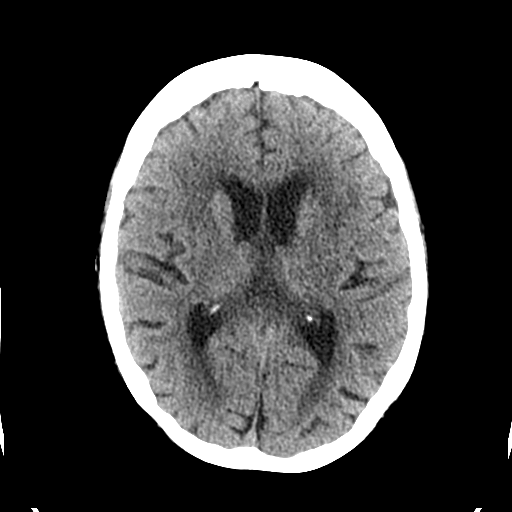
[im 15/29  bone]
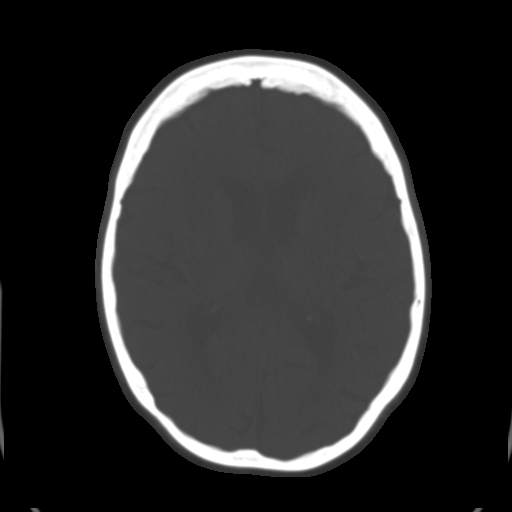
[im 17/29  brain]
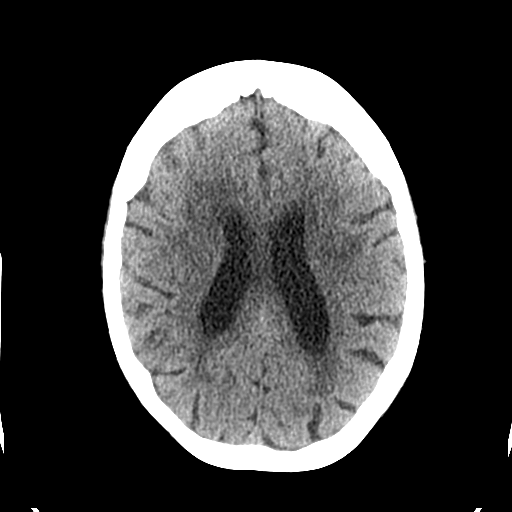
[im 20/29  brain]
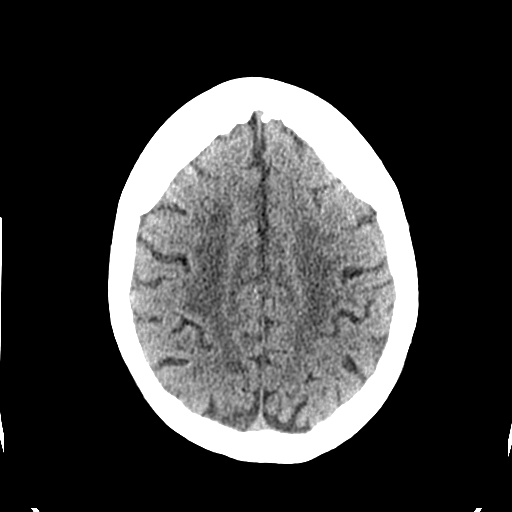
[im 23/29  brain]
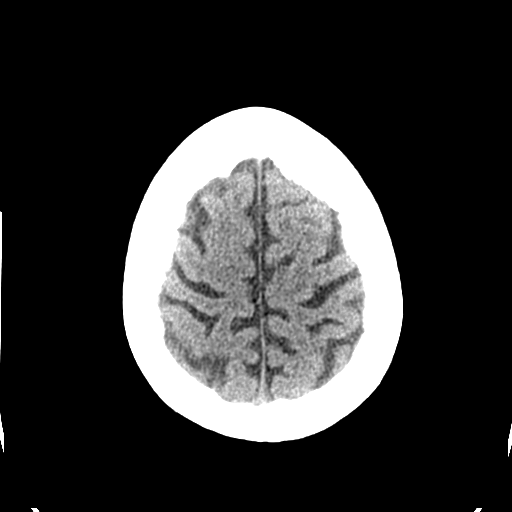
[im 26/29  brain]
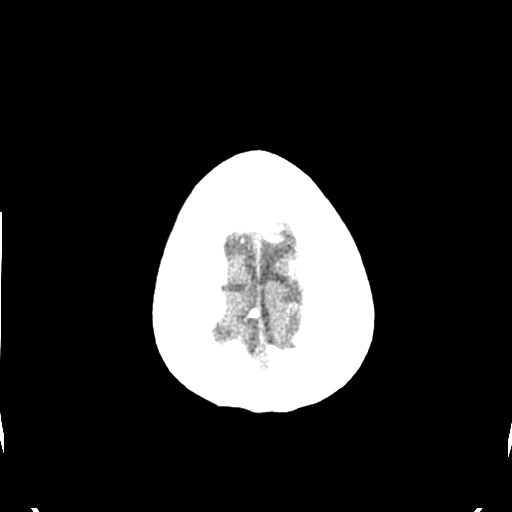
[im 26/29  bone]
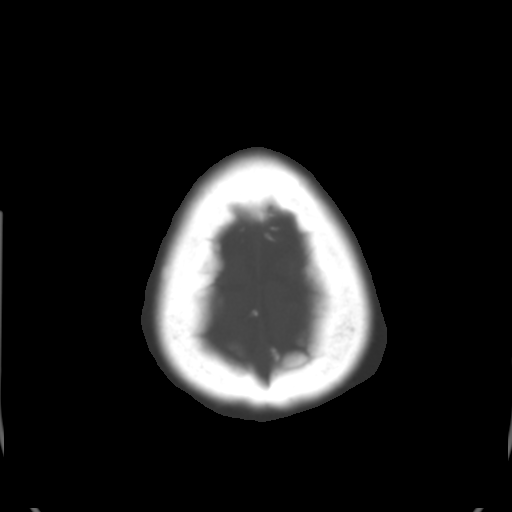

[Series 3: bone windows · axial · 0.43mm/px · z∈[-110,+1]mm · 8 of 49 slices shown]
[im 6/49  bone]
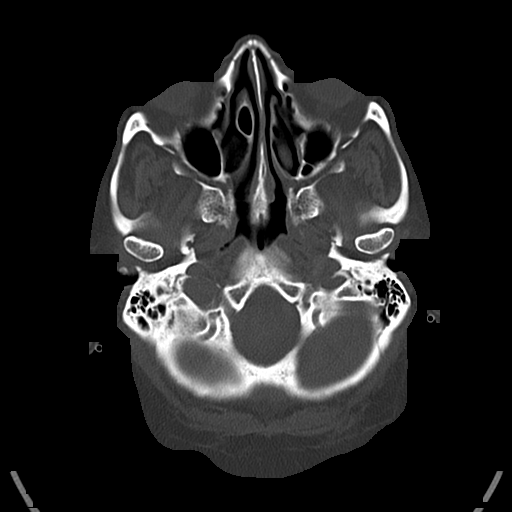
[im 11/49  bone]
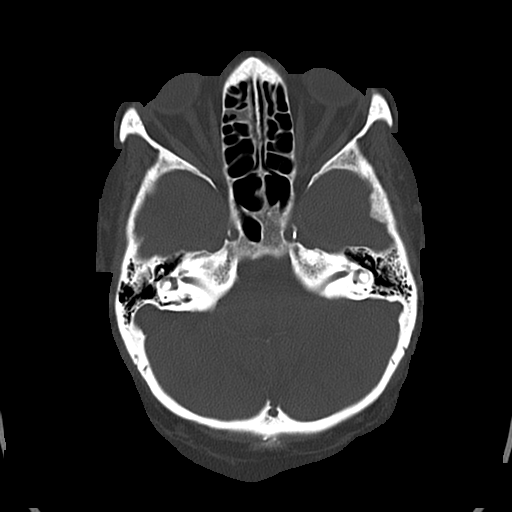
[im 17/49  bone]
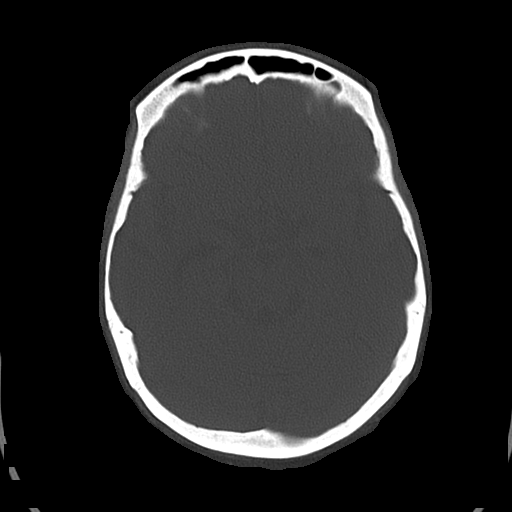
[im 22/49  bone]
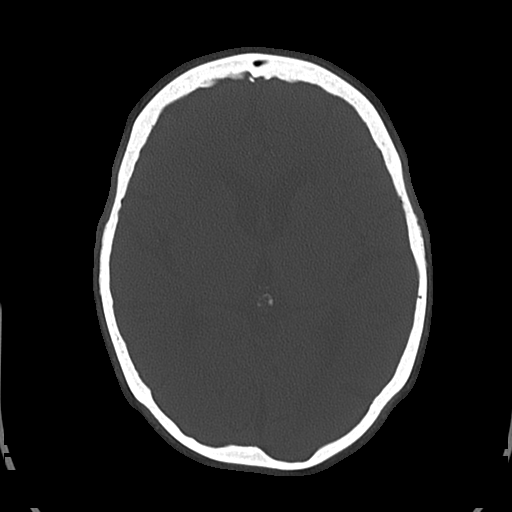
[im 27/49  bone]
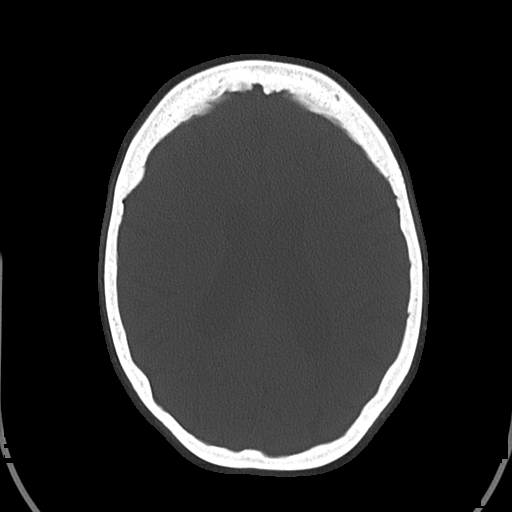
[im 33/49  bone]
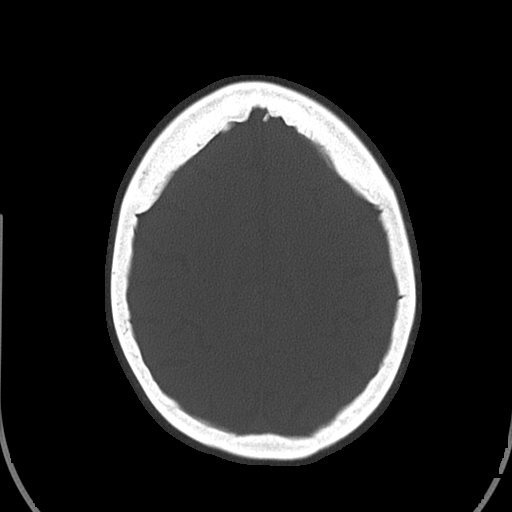
[im 38/49  bone]
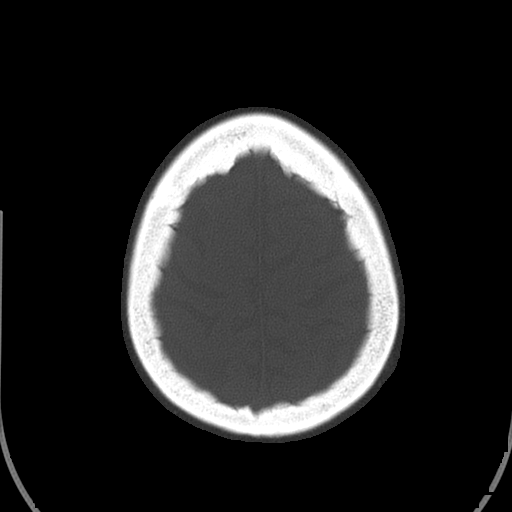
[im 43/49  bone]
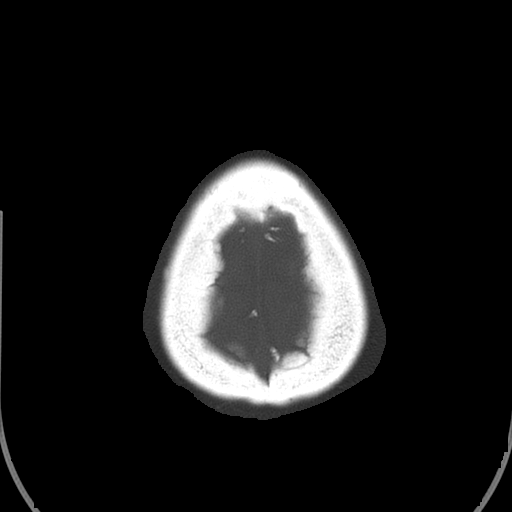

[17 of 30 positions shown; findings below may reference images not displayed]

FINDINGS: Moderate chronic small vessel white matter ischemic
changes are again identified.
Remote cerebellar and right basal ganglia infarcts are again noted.

No acute intracranial abnormalities are identified, including mass
lesion or mass effect, hydrocephalus, extra-axial fluid collection,
midline shift, hemorrhage, or acute infarction.

The visualized bony calvarium is unremarkable.
IMPRESSION: No evidence of acute intracranial abnormality.

Remote cerebellar and right basal ganglia infarcts.

Moderate chronic small vessel white matter ischemic changes

## 2014-01-31 ENCOUNTER — Emergency Department (HOSPITAL_COMMUNITY): Payer: Medicare Other

## 2014-01-31 ENCOUNTER — Inpatient Hospital Stay (HOSPITAL_COMMUNITY)
Admission: EM | Admit: 2014-01-31 | Discharge: 2014-02-04 | DRG: 638 | Disposition: A | Discharge status: Home Health | Payer: Medicare Other | Attending: Internal Medicine | Admitting: Internal Medicine

## 2014-01-31 ENCOUNTER — Encounter (HOSPITAL_COMMUNITY): Payer: Self-pay | Admitting: Emergency Medicine

## 2014-01-31 DIAGNOSIS — E1310 Other specified diabetes mellitus with ketoacidosis without coma: Secondary | ICD-10-CM | POA: Diagnosis not present

## 2014-01-31 DIAGNOSIS — E1129 Type 2 diabetes mellitus with other diabetic kidney complication: Secondary | ICD-10-CM | POA: Diagnosis present

## 2014-01-31 DIAGNOSIS — E8729 Other acidosis: Secondary | ICD-10-CM | POA: Diagnosis present

## 2014-01-31 DIAGNOSIS — K219 Gastro-esophageal reflux disease without esophagitis: Secondary | ICD-10-CM | POA: Diagnosis present

## 2014-01-31 DIAGNOSIS — F068 Other specified mental disorders due to known physiological condition: Secondary | ICD-10-CM

## 2014-01-31 DIAGNOSIS — F0390 Unspecified dementia without behavioral disturbance: Secondary | ICD-10-CM | POA: Diagnosis present

## 2014-01-31 DIAGNOSIS — I5032 Chronic diastolic (congestive) heart failure: Secondary | ICD-10-CM | POA: Diagnosis present

## 2014-01-31 DIAGNOSIS — D62 Acute posthemorrhagic anemia: Secondary | ICD-10-CM | POA: Diagnosis present

## 2014-01-31 DIAGNOSIS — E1110 Type 2 diabetes mellitus with ketoacidosis without coma: Secondary | ICD-10-CM

## 2014-01-31 DIAGNOSIS — E1149 Type 2 diabetes mellitus with other diabetic neurological complication: Secondary | ICD-10-CM | POA: Diagnosis present

## 2014-01-31 DIAGNOSIS — D638 Anemia in other chronic diseases classified elsewhere: Secondary | ICD-10-CM

## 2014-01-31 DIAGNOSIS — J101 Influenza due to other identified influenza virus with other respiratory manifestations: Secondary | ICD-10-CM

## 2014-01-31 DIAGNOSIS — I129 Hypertensive chronic kidney disease with stage 1 through stage 4 chronic kidney disease, or unspecified chronic kidney disease: Secondary | ICD-10-CM | POA: Diagnosis present

## 2014-01-31 DIAGNOSIS — E1165 Type 2 diabetes mellitus with hyperglycemia: Secondary | ICD-10-CM | POA: Diagnosis present

## 2014-01-31 DIAGNOSIS — I509 Heart failure, unspecified: Secondary | ICD-10-CM | POA: Diagnosis present

## 2014-01-31 DIAGNOSIS — N390 Urinary tract infection, site not specified: Secondary | ICD-10-CM

## 2014-01-31 DIAGNOSIS — R059 Cough, unspecified: Secondary | ICD-10-CM

## 2014-01-31 DIAGNOSIS — J9601 Acute respiratory failure with hypoxia: Secondary | ICD-10-CM

## 2014-01-31 DIAGNOSIS — I2510 Atherosclerotic heart disease of native coronary artery without angina pectoris: Secondary | ICD-10-CM | POA: Diagnosis present

## 2014-01-31 DIAGNOSIS — I5030 Unspecified diastolic (congestive) heart failure: Secondary | ICD-10-CM | POA: Diagnosis present

## 2014-01-31 DIAGNOSIS — I10 Essential (primary) hypertension: Secondary | ICD-10-CM

## 2014-01-31 DIAGNOSIS — E872 Acidosis: Secondary | ICD-10-CM

## 2014-01-31 DIAGNOSIS — Z794 Long term (current) use of insulin: Secondary | ICD-10-CM

## 2014-01-31 DIAGNOSIS — N183 Chronic kidney disease, stage 3 unspecified: Secondary | ICD-10-CM

## 2014-01-31 DIAGNOSIS — E119 Type 2 diabetes mellitus without complications: Secondary | ICD-10-CM

## 2014-01-31 DIAGNOSIS — N058 Unspecified nephritic syndrome with other morphologic changes: Secondary | ICD-10-CM | POA: Diagnosis present

## 2014-01-31 DIAGNOSIS — I5033 Acute on chronic diastolic (congestive) heart failure: Secondary | ICD-10-CM

## 2014-01-31 DIAGNOSIS — R197 Diarrhea, unspecified: Secondary | ICD-10-CM

## 2014-01-31 DIAGNOSIS — M79602 Pain in left arm: Secondary | ICD-10-CM

## 2014-01-31 DIAGNOSIS — I428 Other cardiomyopathies: Secondary | ICD-10-CM | POA: Diagnosis present

## 2014-01-31 DIAGNOSIS — Z87891 Personal history of nicotine dependence: Secondary | ICD-10-CM

## 2014-01-31 DIAGNOSIS — Z8673 Personal history of transient ischemic attack (TIA), and cerebral infarction without residual deficits: Secondary | ICD-10-CM

## 2014-01-31 DIAGNOSIS — D500 Iron deficiency anemia secondary to blood loss (chronic): Secondary | ICD-10-CM | POA: Diagnosis present

## 2014-01-31 DIAGNOSIS — I447 Left bundle-branch block, unspecified: Secondary | ICD-10-CM

## 2014-01-31 DIAGNOSIS — E1142 Type 2 diabetes mellitus with diabetic polyneuropathy: Secondary | ICD-10-CM | POA: Diagnosis present

## 2014-01-31 DIAGNOSIS — A0472 Enterocolitis due to Clostridium difficile, not specified as recurrent: Secondary | ICD-10-CM

## 2014-01-31 DIAGNOSIS — M109 Gout, unspecified: Secondary | ICD-10-CM | POA: Diagnosis present

## 2014-01-31 DIAGNOSIS — M129 Arthropathy, unspecified: Secondary | ICD-10-CM | POA: Diagnosis present

## 2014-01-31 DIAGNOSIS — E782 Mixed hyperlipidemia: Secondary | ICD-10-CM

## 2014-01-31 DIAGNOSIS — G9340 Encephalopathy, unspecified: Secondary | ICD-10-CM

## 2014-01-31 DIAGNOSIS — R05 Cough: Secondary | ICD-10-CM

## 2014-01-31 LAB — URINALYSIS, ROUTINE W REFLEX MICROSCOPIC
Bilirubin Urine: NEGATIVE
GLUCOSE, UA: NEGATIVE mg/dL
Ketones, ur: NEGATIVE mg/dL
Nitrite: NEGATIVE
Protein, ur: 30 mg/dL — AB
SPECIFIC GRAVITY, URINE: 1.02 (ref 1.005–1.030)
Urobilinogen, UA: 0.2 mg/dL (ref 0.0–1.0)
pH: 5.5 (ref 5.0–8.0)

## 2014-01-31 LAB — BASIC METABOLIC PANEL
ANION GAP: 17 — AB (ref 5–15)
BUN: 30 mg/dL — ABNORMAL HIGH (ref 6–23)
CALCIUM: 9 mg/dL (ref 8.4–10.5)
CHLORIDE: 103 meq/L (ref 96–112)
CO2: 18 meq/L — AB (ref 19–32)
Creatinine, Ser: 1.27 mg/dL — ABNORMAL HIGH (ref 0.50–1.10)
GFR calc Af Amer: 46 mL/min — ABNORMAL LOW (ref >90)
GFR calc non Af Amer: 39 mL/min — ABNORMAL LOW (ref >90)
Glucose, Bld: 134 mg/dL — ABNORMAL HIGH (ref 70–99)
POTASSIUM: 4.3 meq/L (ref 3.7–5.3)
SODIUM: 138 meq/L (ref 137–147)

## 2014-01-31 LAB — CBG MONITORING, ED
GLUCOSE-CAPILLARY: 133 mg/dL — AB (ref 70–99)
Glucose-Capillary: 165 mg/dL — ABNORMAL HIGH (ref 70–99)

## 2014-01-31 LAB — CBC
HCT: 30.7 % — ABNORMAL LOW (ref 36.0–46.0)
Hemoglobin: 8.9 g/dL — ABNORMAL LOW (ref 12.0–15.0)
MCH: 20.6 pg — AB (ref 26.0–34.0)
MCHC: 29 g/dL — ABNORMAL LOW (ref 30.0–36.0)
MCV: 70.9 fL — AB (ref 78.0–100.0)
PLATELETS: 290 10*3/uL (ref 150–400)
RBC: 4.33 MIL/uL (ref 3.87–5.11)
RDW: 21.2 % — AB (ref 11.5–15.5)
WBC: 8.9 10*3/uL (ref 4.0–10.5)

## 2014-01-31 LAB — COMPREHENSIVE METABOLIC PANEL
ALT: 22 U/L (ref 0–35)
AST: 32 U/L (ref 0–37)
Albumin: 3.6 g/dL (ref 3.5–5.2)
Alkaline Phosphatase: 165 U/L — ABNORMAL HIGH (ref 39–117)
Anion gap: 20 — ABNORMAL HIGH (ref 5–15)
BUN: 30 mg/dL — ABNORMAL HIGH (ref 6–23)
CO2: 16 meq/L — AB (ref 19–32)
Calcium: 9.4 mg/dL (ref 8.4–10.5)
Chloride: 102 mEq/L (ref 96–112)
Creatinine, Ser: 1.28 mg/dL — ABNORMAL HIGH (ref 0.50–1.10)
GFR calc Af Amer: 45 mL/min — ABNORMAL LOW (ref >90)
GFR calc non Af Amer: 39 mL/min — ABNORMAL LOW (ref >90)
Glucose, Bld: 178 mg/dL — ABNORMAL HIGH (ref 70–99)
Potassium: 4.7 mEq/L (ref 3.7–5.3)
SODIUM: 138 meq/L (ref 137–147)
Total Bilirubin: <0.2 mg/dL — ABNORMAL LOW (ref 0.3–1.2)
Total Protein: 8.1 g/dL (ref 6.0–8.3)

## 2014-01-31 LAB — URINE MICROSCOPIC-ADD ON

## 2014-01-31 MED ORDER — SODIUM CHLORIDE 0.9 % IV SOLN
INTRAVENOUS | Status: DC
  Administered 2014-02-01: 125 mL/h via INTRAVENOUS

## 2014-01-31 MED ORDER — INSULIN REGULAR BOLUS VIA INFUSION
0.0000 [IU] | Freq: Three times a day (TID) | INTRAVENOUS | Status: DC
  Filled 2014-01-31: qty 10

## 2014-01-31 MED ORDER — DEXTROSE-NACL 5-0.45 % IV SOLN
INTRAVENOUS | Status: DC
  Administered 2014-02-01: 75 mL/h via INTRAVENOUS

## 2014-01-31 MED ORDER — SODIUM CHLORIDE 0.9 % IV BOLUS (SEPSIS)
1000.0000 mL | Freq: Once | INTRAVENOUS | Status: AC
  Administered 2014-01-31: 1000 mL via INTRAVENOUS

## 2014-01-31 MED ORDER — INSULIN REGULAR HUMAN 100 UNIT/ML IJ SOLN
INTRAMUSCULAR | Status: DC
  Administered 2014-02-01: 0.7 [IU]/h via INTRAVENOUS
  Filled 2014-01-31: qty 2.5

## 2014-01-31 MED ORDER — DEXTROSE 50 % IV SOLN: 25.0000 mL | INTRAVENOUS | Status: DC | PRN

## 2014-01-31 MED ORDER — DEXTROSE 5 % IV SOLN
1.0000 g | Freq: Once | INTRAVENOUS | Status: AC
  Administered 2014-01-31: 1 g via INTRAVENOUS
  Filled 2014-01-31: qty 10

## 2014-01-31 NOTE — ED Provider Notes (Signed)
CSN: [id number]     Arrival date & time 01/31/14  1447 History   First MD Initiated Contact with Patient 01/31/14 1948     Chief Complaint  Patient presents with  . Hyperglycemia     (Consider location/radiation/quality/duration/timing/severity/associated sxs/prior Treatment) HPI Glenda Gonzalez is a 80 y.o. female with hx of DM,  CHF, CAD, dementia, anemia, CVA, Who presents to ED with complaint of elevated blood sugar. Pt's husband states that pt stays with her daughter during the day. He picked her up around 5pm, when she seemed "not like herself." He states she was quiet, which is not like her. When she got home, they checked her blood sugar and it was over 400. Pt was brought here for further evaluation. Pt denies any complaints. She is back to her normal mental status. She did receive her normal insulin dose prior to coming in.   Past Medical History  Diagnosis Date  . GERD (gastroesophageal reflux disease)   . Hypertension   . Diverticulitis   . Gout   . Essential and other specified forms of tremor   . Dementia   . CAD (coronary artery disease)   . Anemia, chronic disease   . Left arm pain   . CHF (congestive heart failure)     Diastolic dysfunction  . Pneumonia     child  . Acute renal failure (ARF)     denies  . Seizures     r/t stroke no rx  . Arthritis   . Diabetes mellitus     no rx for 6 months  . Stroke 2014   Past Surgical History  Procedure Laterality Date  . Dilation and curettage of uterus    . Tumor removal      abdomen  . Abdominal hysterectomy    . Cataract extraction w/phaco Left 03/30/2013    Procedure: CATARACT EXTRACTION PHACO AND INTRAOCULAR LENS PLACEMENT (IOC);  Surgeon: Shade Flood, MD;  Location: [facility] OR;  Service: Ophthalmology;  Laterality: Left;  . Appendectomy    . Colonoscopy    . Fracture surgery Left     foot and ankle  . Pars plana vitrectomy Left 12/28/2013    Procedure: PARS PLANA VITRECTOMY WITH 23 GAUGE LEFT EYE ;  Surgeon:  Shade Flood, MD;  Location: [facility] OR;  Service: Ophthalmology;  Laterality: Left;   Family History  Problem Relation Age of Onset  . Colon cancer Neg Hx   . Heart disease Father   . Heart disease Brother    History  Substance Use Topics  . Smoking status: Former Smoker -- 0.50 packs/day for 10 years    Types: Cigarettes    Quit date: 03/24/1980  . Smokeless tobacco: Never Used  . Alcohol Use: No   OB History   Grav Para Term Preterm Abortions TAB SAB Ect Mult Living                 Review of Systems  Constitutional: Negative for fever and chills.  Respiratory: Negative for cough, chest tightness and shortness of breath.   Cardiovascular: Negative for chest pain, palpitations and leg swelling.  Gastrointestinal: Negative for nausea, vomiting, abdominal pain and diarrhea.  Genitourinary: Negative for dysuria, flank pain, vaginal bleeding, vaginal discharge, vaginal pain and pelvic pain.  Musculoskeletal: Negative for arthralgias, myalgias, neck pain and neck stiffness.  Skin: Negative for rash.  Neurological: Positive for weakness and headaches. Negative for dizziness.  All other systems reviewed and are negative.     Allergies  Ace inhibitors; Codeine; Fentanyl; Other; Morphine; and Penicillins  Home Medications   Prior to Admission medications   Medication Sig Start Date End Date Taking? Authorizing Provider  albuterol (PROVENTIL HFA;VENTOLIN HFA) 108 (90 BASE) MCG/ACT inhaler Inhale 2 puffs into the lungs every 6 (six) hours as needed for wheezing or shortness of breath. 05/29/13  Yes Jessica U Vann, DO  bimatoprost (LUMIGAN) 0.01 % SOLN Place 1 drop into both eyes 2 (two) times daily.    Yes Historical Provider, MD  diphenhydrAMINE (BENADRYL) 25 mg capsule Take 25 mg by mouth at bedtime as needed for itching or sleep.    Yes Historical Provider, MD  donepezil (ARICEPT) 10 MG tablet Take 10 mg by mouth at bedtime.   Yes Historical Provider, MD  esomeprazole (NEXIUM) 40 MG  capsule Take 40 mg by mouth daily.    Yes Historical Provider, MD  hydrALAZINE (APRESOLINE) 25 MG tablet Take 1 tablet (25 mg total) by mouth every 8 (eight) hours. 10/19/13  Yes Jerald Kief, MD  insulin aspart (NOVOLOG FLEXPEN) 100 UNIT/ML FlexPen Inject 2 Units into the skin 3 (three) times daily with meals. 10/19/13  Yes Jerald Kief, MD  Insulin Glargine (LANTUS) 100 UNIT/ML Solostar Pen Inject 18 Units into the skin daily at 10 pm. 10/19/13  Yes Jerald Kief, MD  isosorbide mononitrate (IMDUR) 60 MG 24 hr tablet Take 1 tablet (60 mg total) by mouth daily. 10/19/13  Yes Jerald Kief, MD  ketorolac (ACULAR) 0.4 % SOLN Place 1 drop into both eyes 4 (four) times daily.   Yes Historical Provider, MD  metoprolol tartrate (LOPRESSOR) 25 MG tablet Take 1 tablet (25 mg total) by mouth 2 (two) times daily. 10/19/13  Yes Jerald Kief, MD  Polyethyl Glycol-Propyl Glycol (SYSTANE ULTRA OP) Place 1 drop into both eyes daily as needed. For dry eyes   Yes Historical Provider, MD  prednisoLONE acetate (PRED FORTE) 1 % ophthalmic suspension Place 1 drop into both eyes 4 (four) times daily.   Yes Historical Provider, MD  pregabalin (LYRICA) 50 MG capsule Take 50 mg by mouth 3 (three) times daily.   Yes Historical Provider, MD  Insulin Pen Needle 29G X 12.7MM MISC 1 Device by Does not apply route 4 (four) times daily. 10/19/13   Jerald Kief, MD  nitroGLYCERIN (NITROSTAT) 0.4 MG SL tablet Place 0.4 mg under the tongue every 5 (five) minutes as needed for chest pain.    Historical Provider, MD   BP 169/80  Pulse 94  Temp(Src) 97.7 F (36.5 C) (Oral)  Resp 16  SpO2 100% Physical Exam  Nursing note and vitals reviewed. Constitutional: She appears well-developed and well-nourished. No distress.  HENT:  Head: Normocephalic.  Eyes: Conjunctivae are normal.  Neck: Neck supple.  Cardiovascular: Normal rate, regular rhythm and normal heart sounds.   Pulmonary/Chest: Effort normal and breath sounds normal.  No respiratory distress. She has no wheezes. She has no rales.  Abdominal: Soft. Bowel sounds are normal. She exhibits no distension. There is no tenderness. There is no rebound and no guarding.  Musculoskeletal: She exhibits edema.  Bilateral LE edema present  Neurological: She is alert.  Skin: Skin is warm and dry.  Psychiatric: She has a normal mood and affect. Her behavior is normal.    ED Course  Procedures (including critical care time) Labs Review Labs Reviewed  CBC - Abnormal; Notable for the following:    Hemoglobin 8.9 (*)    HCT 30.7 (*)  MCV 70.9 (*)    MCH 20.6 (*)    MCHC 29.0 (*)    RDW 21.2 (*)    All other components within normal limits  COMPREHENSIVE METABOLIC PANEL - Abnormal; Notable for the following:    CO2 16 (*)    Glucose, Bld 178 (*)    BUN 30 (*)    Creatinine, Ser 1.28 (*)    Alkaline Phosphatase 165 (*)    Total Bilirubin <0.2 (*)    GFR calc non Af Amer 39 (*)    GFR calc Af Amer 45 (*)    Anion gap 20 (*)    All other components within normal limits  URINALYSIS, ROUTINE W REFLEX MICROSCOPIC - Abnormal; Notable for the following:    APPearance CLOUDY (*)    Hgb urine dipstick SMALL (*)    Protein, ur 30 (*)    Leukocytes, UA MODERATE (*)    All other components within normal limits  URINE MICROSCOPIC-ADD ON - Abnormal; Notable for the following:    Squamous Epithelial / LPF FEW (*)    Bacteria, UA MANY (*)    All other components within normal limits  CBG MONITORING, ED - Abnormal; Notable for the following:    Glucose-Capillary 165 (*)    All other components within normal limits  CBG MONITORING, ED - Abnormal; Notable for the following:    Glucose-Capillary 133 (*)    All other components within normal limits  URINE CULTURE    Imaging Review Dg Chest 2 View  01/31/2014   CLINICAL DATA:  Hyperglycemia.  EXAM: CHEST  2 VIEW  COMPARISON:  10/14/2013  FINDINGS: Shallow inspiration The heart size and mediastinal contours are within  normal limits. Both lungs are clear. The visualized skeletal structures are unremarkable.  IMPRESSION: No active cardiopulmonary disease.   Electronically Signed   By: Burman Nieves M.D.   On: 01/31/2014 21:38     EKG Interpretation None      MDM   Final diagnoses:  Diabetic ketoacidosis without coma associated with type 2 diabetes mellitus  UTI (lower urinary tract infection)   Pt is alert and oriented. Glucose here 165. Labs showing bicarb of 16 with anion gap of 20. Will get UA, CXR, looking for infectious cause of hyperglycemia. Will start IV fluids. Pt does not want to be admitted. Will recheck bmet after fluids.    9:06 PM UA infected. Cultures sent. Will start rocephin.   12:54 AM Pt's repeat metabolic panel showed bicarb improving, now 18, gap better at 17, however continues to have gap. Started on stabilizer. Discussed with Dr. Rhunette Croft.. Will need admission for treatment.   Spoke with [facility]. Will admit.   Filed Vitals:   01/31/14 2100 01/31/14 2200 01/31/14 2300 01/31/14 2301  BP: 156/85 183/79 184/83   Pulse: 75 84  93  Temp:      TempSrc:      Resp: 18 21    SpO2: 99% 100%  93%       Myriam Jacobson Rosaline Ezekiel, PA-C 02/01/14 0056

## 2014-01-31 NOTE — ED Notes (Signed)
IV team at bedside 

## 2014-01-31 NOTE — ED Notes (Signed)
Paged IV team 

## 2014-01-31 NOTE — ED Notes (Signed)
Pt reports checking BG this AM and it was 500. Pt unsure if she took insulin today or not. Pt is AO x4. Pt reports  2/10 HA. NAD.

## 2014-02-01 ENCOUNTER — Encounter (HOSPITAL_COMMUNITY): Payer: Self-pay | Admitting: Internal Medicine

## 2014-02-01 DIAGNOSIS — E1149 Type 2 diabetes mellitus with other diabetic neurological complication: Secondary | ICD-10-CM | POA: Diagnosis present

## 2014-02-01 DIAGNOSIS — I2510 Atherosclerotic heart disease of native coronary artery without angina pectoris: Secondary | ICD-10-CM | POA: Diagnosis present

## 2014-02-01 DIAGNOSIS — N183 Chronic kidney disease, stage 3 unspecified: Secondary | ICD-10-CM

## 2014-02-01 DIAGNOSIS — E1310 Other specified diabetes mellitus with ketoacidosis without coma: Principal | ICD-10-CM

## 2014-02-01 DIAGNOSIS — I129 Hypertensive chronic kidney disease with stage 1 through stage 4 chronic kidney disease, or unspecified chronic kidney disease: Secondary | ICD-10-CM | POA: Diagnosis present

## 2014-02-01 DIAGNOSIS — Z8673 Personal history of transient ischemic attack (TIA), and cerebral infarction without residual deficits: Secondary | ICD-10-CM | POA: Diagnosis not present

## 2014-02-01 DIAGNOSIS — D62 Acute posthemorrhagic anemia: Secondary | ICD-10-CM | POA: Diagnosis present

## 2014-02-01 DIAGNOSIS — I5032 Chronic diastolic (congestive) heart failure: Secondary | ICD-10-CM | POA: Diagnosis present

## 2014-02-01 DIAGNOSIS — F0390 Unspecified dementia without behavioral disturbance: Secondary | ICD-10-CM | POA: Diagnosis present

## 2014-02-01 DIAGNOSIS — D500 Iron deficiency anemia secondary to blood loss (chronic): Secondary | ICD-10-CM | POA: Diagnosis present

## 2014-02-01 DIAGNOSIS — E1165 Type 2 diabetes mellitus with hyperglycemia: Secondary | ICD-10-CM | POA: Diagnosis present

## 2014-02-01 DIAGNOSIS — Z794 Long term (current) use of insulin: Secondary | ICD-10-CM | POA: Diagnosis not present

## 2014-02-01 DIAGNOSIS — E1110 Type 2 diabetes mellitus with ketoacidosis without coma: Secondary | ICD-10-CM | POA: Diagnosis present

## 2014-02-01 DIAGNOSIS — M109 Gout, unspecified: Secondary | ICD-10-CM | POA: Diagnosis present

## 2014-02-01 DIAGNOSIS — I428 Other cardiomyopathies: Secondary | ICD-10-CM | POA: Diagnosis present

## 2014-02-01 DIAGNOSIS — Z87891 Personal history of nicotine dependence: Secondary | ICD-10-CM | POA: Diagnosis not present

## 2014-02-01 DIAGNOSIS — E1142 Type 2 diabetes mellitus with diabetic polyneuropathy: Secondary | ICD-10-CM | POA: Diagnosis present

## 2014-02-01 DIAGNOSIS — I5030 Unspecified diastolic (congestive) heart failure: Secondary | ICD-10-CM

## 2014-02-01 DIAGNOSIS — I509 Heart failure, unspecified: Secondary | ICD-10-CM

## 2014-02-01 DIAGNOSIS — M129 Arthropathy, unspecified: Secondary | ICD-10-CM | POA: Diagnosis present

## 2014-02-01 DIAGNOSIS — N058 Unspecified nephritic syndrome with other morphologic changes: Secondary | ICD-10-CM | POA: Diagnosis present

## 2014-02-01 DIAGNOSIS — K219 Gastro-esophageal reflux disease without esophagitis: Secondary | ICD-10-CM | POA: Diagnosis present

## 2014-02-01 DIAGNOSIS — E1129 Type 2 diabetes mellitus with other diabetic kidney complication: Secondary | ICD-10-CM | POA: Diagnosis present

## 2014-02-01 DIAGNOSIS — D638 Anemia in other chronic diseases classified elsewhere: Secondary | ICD-10-CM | POA: Diagnosis present

## 2014-02-01 DIAGNOSIS — N390 Urinary tract infection, site not specified: Secondary | ICD-10-CM | POA: Diagnosis present

## 2014-02-01 LAB — BASIC METABOLIC PANEL
Anion gap: 15 (ref 5–15)
Anion gap: 13 (ref 5–15)
Anion gap: 15 (ref 5–15)
BUN: 26 mg/dL — AB (ref 6–23)
BUN: 25 mg/dL — AB (ref 6–23)
BUN: 25 mg/dL — ABNORMAL HIGH (ref 6–23)
CALCIUM: 8.7 mg/dL (ref 8.4–10.5)
CALCIUM: 8.6 mg/dL (ref 8.4–10.5)
CHLORIDE: 108 meq/L (ref 96–112)
CHLORIDE: 108 meq/L (ref 96–112)
CHLORIDE: 106 meq/L (ref 96–112)
CO2: 18 meq/L — AB (ref 19–32)
CO2: 17 meq/L — AB (ref 19–32)
CO2: 16 meq/L — AB (ref 19–32)
CREATININE: 1.23 mg/dL — AB (ref 0.50–1.10)
Calcium: 8.8 mg/dL (ref 8.4–10.5)
Creatinine, Ser: 1.22 mg/dL — ABNORMAL HIGH (ref 0.50–1.10)
Creatinine, Ser: 1.2 mg/dL — ABNORMAL HIGH (ref 0.50–1.10)
GFR calc Af Amer: 47 mL/min — ABNORMAL LOW (ref >90)
GFR calc Af Amer: 48 mL/min — ABNORMAL LOW (ref >90)
GFR calc non Af Amer: 41 mL/min — ABNORMAL LOW (ref >90)
GFR calc non Af Amer: 41 mL/min — ABNORMAL LOW (ref >90)
GFR, EST AFRICAN AMERICAN: 49 mL/min — AB (ref >90)
GFR, EST NON AFRICAN AMERICAN: 42 mL/min — AB (ref >90)
GLUCOSE: 138 mg/dL — AB (ref 70–99)
Glucose, Bld: 135 mg/dL — ABNORMAL HIGH (ref 70–99)
Glucose, Bld: 218 mg/dL — ABNORMAL HIGH (ref 70–99)
Potassium: 4.3 mEq/L (ref 3.7–5.3)
Potassium: 4.8 mEq/L (ref 3.7–5.3)
Potassium: 4.8 mEq/L (ref 3.7–5.3)
Sodium: 141 mEq/L (ref 137–147)
Sodium: 138 mEq/L (ref 137–147)
Sodium: 137 mEq/L (ref 137–147)

## 2014-02-01 LAB — CBC
HCT: 27 % — ABNORMAL LOW (ref 36.0–46.0)
Hemoglobin: 7.9 g/dL — ABNORMAL LOW (ref 12.0–15.0)
MCH: 20.8 pg — ABNORMAL LOW (ref 26.0–34.0)
MCHC: 29.3 g/dL — ABNORMAL LOW (ref 30.0–36.0)
MCV: 71.2 fL — ABNORMAL LOW (ref 78.0–100.0)
PLATELETS: 270 10*3/uL (ref 150–400)
RBC: 3.79 MIL/uL — ABNORMAL LOW (ref 3.87–5.11)
RDW: 21.5 % — AB (ref 11.5–15.5)
WBC: 7.8 10*3/uL (ref 4.0–10.5)

## 2014-02-01 LAB — I-STAT VENOUS BLOOD GAS, ED
ACID-BASE DEFICIT: 6 mmol/L — AB (ref 0.0–2.0)
Bicarbonate: 18.7 mEq/L — ABNORMAL LOW (ref 20.0–24.0)
O2 Saturation: 54 %
PH VEN: 7.381 — AB (ref 7.250–7.300)
TCO2: 20 mmol/L (ref 0–100)
pCO2, Ven: 31.6 mmHg — ABNORMAL LOW (ref 45.0–50.0)
pO2, Ven: 29 mmHg — CL (ref 30.0–45.0)

## 2014-02-01 LAB — GLUCOSE, CAPILLARY
GLUCOSE-CAPILLARY: 142 mg/dL — AB (ref 70–99)
GLUCOSE-CAPILLARY: 144 mg/dL — AB (ref 70–99)
GLUCOSE-CAPILLARY: 171 mg/dL — AB (ref 70–99)
GLUCOSE-CAPILLARY: 199 mg/dL — AB (ref 70–99)
Glucose-Capillary: 130 mg/dL — ABNORMAL HIGH (ref 70–99)
Glucose-Capillary: 150 mg/dL — ABNORMAL HIGH (ref 70–99)
Glucose-Capillary: 145 mg/dL — ABNORMAL HIGH (ref 70–99)
Glucose-Capillary: 166 mg/dL — ABNORMAL HIGH (ref 70–99)

## 2014-02-01 LAB — URINE CULTURE: Colony Count: >=100000

## 2014-02-01 LAB — LACTIC ACID, PLASMA: Lactic Acid, Venous: 1.5 mmol/L (ref 0.5–2.2)

## 2014-02-01 LAB — CBG MONITORING, ED
Glucose-Capillary: 134 mg/dL — ABNORMAL HIGH (ref 70–99)
Glucose-Capillary: 138 mg/dL — ABNORMAL HIGH (ref 70–99)

## 2014-02-01 LAB — MRSA PCR SCREENING: MRSA by PCR: NEGATIVE

## 2014-02-01 LAB — TROPONIN I
Troponin I: <0.3 ng/mL (ref <0.30)
Troponin I: <0.3 ng/mL (ref <0.30)

## 2014-02-01 MED ORDER — METOPROLOL TARTRATE 25 MG PO TABS
25.0000 mg | ORAL_TABLET | Freq: Two times a day (BID) | ORAL | Status: DC
  Administered 2014-02-01 – 2014-02-04 (×8): 25 mg via ORAL
  Filled 2014-02-01 (×10): qty 1

## 2014-02-01 MED ORDER — PREDNISOLONE ACETATE 1 % OP SUSP
1.0000 [drp] | Freq: Three times a day (TID) | OPHTHALMIC | Status: DC
  Administered 2014-02-01 – 2014-02-04 (×9): 1 [drp] via OPHTHALMIC
  Filled 2014-02-01: qty 1

## 2014-02-01 MED ORDER — DEXTROSE 50 % IV SOLN: 25.0000 mL | INTRAVENOUS | Status: DC | PRN

## 2014-02-01 MED ORDER — ALBUTEROL SULFATE (2.5 MG/3ML) 0.083% IN NEBU: 2.5000 mg | INHALATION_SOLUTION | Freq: Four times a day (QID) | RESPIRATORY_TRACT | Status: DC | PRN

## 2014-02-01 MED ORDER — POLYVINYL ALCOHOL 1.4 % OP SOLN
1.0000 [drp] | Freq: Every day | OPHTHALMIC | Status: DC
Start: 2014-02-01 — End: 2014-02-04
  Administered 2014-02-01 – 2014-02-04 (×4): 1 [drp] via OPHTHALMIC
  Filled 2014-02-01: qty 15

## 2014-02-01 MED ORDER — NITROGLYCERIN 0.4 MG SL SUBL: 0.4000 mg | SUBLINGUAL_TABLET | SUBLINGUAL | Status: DC | PRN

## 2014-02-01 MED ORDER — PNEUMOCOCCAL VAC POLYVALENT 25 MCG/0.5ML IJ INJ
0.5000 mL | INJECTION | INTRAMUSCULAR | Status: AC
  Administered 2014-02-02: 0.5 mL via INTRAMUSCULAR
  Filled 2014-02-01: qty 0.5

## 2014-02-01 MED ORDER — SODIUM CHLORIDE 0.9 % IV SOLN: INTRAVENOUS | Status: DC

## 2014-02-01 MED ORDER — INSULIN ASPART 100 UNIT/ML ~~LOC~~ SOLN
0.0000 [IU] | Freq: Three times a day (TID) | SUBCUTANEOUS | Status: DC
  Administered 2014-02-01: 3 [IU] via SUBCUTANEOUS
  Administered 2014-02-01: 2 [IU] via SUBCUTANEOUS
  Administered 2014-02-01: 3 [IU] via SUBCUTANEOUS
  Administered 2014-02-02 (×2): 2 [IU] via SUBCUTANEOUS
  Administered 2014-02-02 – 2014-02-03 (×2): 3 [IU] via SUBCUTANEOUS
  Administered 2014-02-03: 2 [IU] via SUBCUTANEOUS
  Administered 2014-02-03: 5 [IU] via SUBCUTANEOUS
  Administered 2014-02-04: 3 [IU] via SUBCUTANEOUS

## 2014-02-01 MED ORDER — KETOROLAC TROMETHAMINE 0.5 % OP SOLN
1.0000 [drp] | Freq: Three times a day (TID) | OPHTHALMIC | Status: DC
  Administered 2014-02-01 – 2014-02-04 (×9): 1 [drp] via OPHTHALMIC
  Filled 2014-02-01: qty 3

## 2014-02-01 MED ORDER — HYDRALAZINE HCL 25 MG PO TABS
25.0000 mg | ORAL_TABLET | Freq: Three times a day (TID) | ORAL | Status: DC
  Administered 2014-02-01 – 2014-02-04 (×10): 25 mg via ORAL
  Filled 2014-02-01 (×13): qty 1

## 2014-02-01 MED ORDER — PREGABALIN 25 MG PO CAPS
50.0000 mg | ORAL_CAPSULE | Freq: Three times a day (TID) | ORAL | Status: DC
  Administered 2014-02-01 – 2014-02-04 (×10): 50 mg via ORAL
  Filled 2014-02-01 (×10): qty 2

## 2014-02-01 MED ORDER — DIPHENHYDRAMINE HCL 25 MG PO CAPS: 25.0000 mg | ORAL_CAPSULE | Freq: Every evening | ORAL | Status: DC | PRN

## 2014-02-01 MED ORDER — INSULIN REGULAR BOLUS VIA INFUSION
0.0000 [IU] | Freq: Three times a day (TID) | INTRAVENOUS | Status: DC
  Filled 2014-02-01: qty 10

## 2014-02-01 MED ORDER — DEXTROSE-NACL 5-0.45 % IV SOLN: INTRAVENOUS | Status: DC

## 2014-02-01 MED ORDER — INSULIN GLARGINE 100 UNIT/ML ~~LOC~~ SOLN
12.0000 [IU] | Freq: Every day | SUBCUTANEOUS | Status: DC
  Administered 2014-02-01 – 2014-02-04 (×4): 12 [IU] via SUBCUTANEOUS
  Filled 2014-02-01 (×5): qty 0.12

## 2014-02-01 MED ORDER — PANTOPRAZOLE SODIUM 40 MG PO TBEC
40.0000 mg | DELAYED_RELEASE_TABLET | Freq: Every day | ORAL | Status: DC
  Administered 2014-02-01 – 2014-02-04 (×4): 40 mg via ORAL
  Filled 2014-02-01 (×4): qty 1

## 2014-02-01 MED ORDER — DONEPEZIL HCL 10 MG PO TABS
10.0000 mg | ORAL_TABLET | Freq: Every day | ORAL | Status: DC
  Administered 2014-02-01 – 2014-02-03 (×3): 10 mg via ORAL
  Filled 2014-02-01 (×4): qty 1

## 2014-02-01 MED ORDER — POTASSIUM CHLORIDE 10 MEQ/100ML IV SOLN: 10.0000 meq | INTRAVENOUS | Status: AC

## 2014-02-01 MED ORDER — DEXTROSE 5 % IV SOLN: 1.0000 g | INTRAVENOUS | Status: DC

## 2014-02-01 MED ORDER — DEXTROSE 5 % IV SOLN
1.0000 g | Freq: Every day | INTRAVENOUS | Status: DC
  Administered 2014-02-01 – 2014-02-03 (×3): 1 g via INTRAVENOUS
  Filled 2014-02-01 (×6): qty 10

## 2014-02-01 MED ORDER — ALBUTEROL SULFATE (2.5 MG/3ML) 0.083% IN NEBU
2.5000 mg | INHALATION_SOLUTION | RESPIRATORY_TRACT | Status: DC | PRN
  Administered 2014-02-04: 2.5 mg via RESPIRATORY_TRACT
  Filled 2014-02-01: qty 3

## 2014-02-01 MED ORDER — HEPARIN SODIUM (PORCINE) 5000 UNIT/ML IJ SOLN
5000.0000 [IU] | Freq: Three times a day (TID) | INTRAMUSCULAR | Status: DC
  Administered 2014-02-01 – 2014-02-03 (×7): 5000 [IU] via SUBCUTANEOUS
  Filled 2014-02-01 (×12): qty 1

## 2014-02-01 MED ORDER — ISOSORBIDE MONONITRATE ER 60 MG PO TB24
60.0000 mg | ORAL_TABLET | Freq: Every day | ORAL | Status: DC
  Administered 2014-02-01 – 2014-02-04 (×4): 60 mg via ORAL
  Filled 2014-02-01 (×4): qty 1

## 2014-02-01 MED ORDER — LATANOPROST 0.005 % OP SOLN
1.0000 [drp] | Freq: Every day | OPHTHALMIC | Status: DC
  Administered 2014-02-01 – 2014-02-03 (×2): 1 [drp] via OPHTHALMIC
  Filled 2014-02-01: qty 2.5

## 2014-02-01 NOTE — H&P (Addendum)
[facility] History and Physical  PRIMROSE OLER SEG:[id number] DOB: 1934-04-17 DOA: 01/31/2014  Referring physician: ED physician PCP: Billee Cashing, MD  Specialists:   Chief Complaint: Hyperglycemia  HPI: Glenda Gonzalez is a 80 y.o. female with PMH of poorly controlled diabetes (A1c 13.4 on 10/16/13),  dCHF, CAD, dementia, anemia, who presents to ED with elevated blood sugar.   The patient has been doing well until today when patient was noticed to be slower than her normal self. She becomes very quiet. Her husband checked her blood sugar and it was over 400. Patient does not have chest pain, cough, fever, chills, nausea, vomiting or diarrhea. She has chronic shortness of breath, which is slightly worsened. She was brought to ED for further evaluation. She was found to have elevated anion gap of 20. She received 2 L of normal saline the ED and insulin infusion, the repeated BMP showed anion gap still at 17. Patient denies any symptoms for UTI, such as dysuria, urgency, or burning of urination, but her urinalysis is positive for UTI. Insulin drip and IV ceftriaxone were started in ED. I was asked to admit the patient to inpatient.   Review of Systems: As presented in the history of presenting illness, rest negative.  Where does patient live?  Lives with her husband at home in [place] Can patient participate in ADLs? Yes  Allergy:  Allergies  Allergen Reactions  . Ace Inhibitors Shortness Of Breath and Other (See Comments)    Angioedema   . Codeine Other (See Comments)    hallucination  . Fentanyl Other (See Comments)    Abnormal behavior per husband  . Other Other (See Comments)    Beef and turkey-causes gout flareup  . Morphine Itching  . Penicillins Itching    Past Medical History  Diagnosis Date  . GERD (gastroesophageal reflux disease)   . Hypertension   . Diverticulitis   . Gout   . Essential and other specified forms of tremor   . Dementia   . CAD  (coronary artery disease)   . Anemia, chronic disease   . Left arm pain   . CHF (congestive heart failure)     Diastolic dysfunction  . Pneumonia     child  . Acute renal failure (ARF)     denies  . Seizures     r/t stroke no rx  . Arthritis   . Diabetes mellitus     no rx for 6 months  . Stroke 2014  . CAD 04/25/2009    Qualifier: Diagnosis of  By: Shirlee Latch, MD, [place]      Past Surgical History  Procedure Laterality Date  . Dilation and curettage of uterus    . Tumor removal      abdomen  . Abdominal hysterectomy    . Cataract extraction w/phaco Left 03/30/2013    Procedure: CATARACT EXTRACTION PHACO AND INTRAOCULAR LENS PLACEMENT (IOC);  Surgeon: Shade Flood, MD;  Location[facility] OR;  Service: Ophthalmology;  Laterality: Left;  . Appendectomy    . Colonoscopy    . Fracture surgery Left     foot and ankle  . Pars plana vitrectomy Left 12/28/2013    Procedure: PARS PLANA VITRECTOMY WITH 23 GAUGE LEFT EYE ;  Surgeon: Shade Flood, MD;  Location: [facility] OR;  Service: Ophthalmology;  Laterality: Left;    Social History:  reports that she quit smoking about 33 years ago. Her smoking use included Cigarettes. She has a 5 pack-year smoking history. She has  never used smokeless tobacco. She reports that she does not drink alcohol or use illicit drugs.  Family History:  Family History  Problem Relation Age of Onset  . Colon cancer Neg Hx   . Heart disease Father   . Heart disease Brother      Prior to Admission medications   Medication Sig Start Date End Date Taking? Authorizing Provider  albuterol (PROVENTIL HFA;VENTOLIN HFA) 108 (90 BASE) MCG/ACT inhaler Inhale 2 puffs into the lungs every 6 (six) hours as needed for wheezing or shortness of breath. 05/29/13  Yes Jessica U Vann, DO  bimatoprost (LUMIGAN) 0.01 % SOLN Place 1 drop into both eyes 2 (two) times daily.    Yes Historical Provider, MD  diphenhydrAMINE (BENADRYL) 25 mg capsule Take 25 mg by mouth at bedtime as needed for itching  or sleep.    Yes Historical Provider, MD  donepezil (ARICEPT) 10 MG tablet Take 10 mg by mouth at bedtime.   Yes Historical Provider, MD  esomeprazole (NEXIUM) 40 MG capsule Take 40 mg by mouth daily.    Yes Historical Provider, MD  hydrALAZINE (APRESOLINE) 25 MG tablet Take 1 tablet (25 mg total) by mouth every 8 (eight) hours. 10/19/13  Yes Jerald Kief, MD  insulin aspart (NOVOLOG FLEXPEN) 100 UNIT/ML FlexPen Inject 2 Units into the skin 3 (three) times daily with meals. 10/19/13  Yes Jerald Kief, MD  Insulin Glargine (LANTUS) 100 UNIT/ML Solostar Pen Inject 18 Units into the skin daily at 10 pm. 10/19/13  Yes Jerald Kief, MD  isosorbide mononitrate (IMDUR) 60 MG 24 hr tablet Take 1 tablet (60 mg total) by mouth daily. 10/19/13  Yes Jerald Kief, MD  ketorolac (ACULAR) 0.4 % SOLN Place 1 drop into both eyes 4 (four) times daily.   Yes Historical Provider, MD  metoprolol tartrate (LOPRESSOR) 25 MG tablet Take 1 tablet (25 mg total) by mouth 2 (two) times daily. 10/19/13  Yes Jerald Kief, MD  Polyethyl Glycol-Propyl Glycol (SYSTANE ULTRA OP) Place 1 drop into both eyes daily as needed. For dry eyes   Yes Historical Provider, MD  prednisoLONE acetate (PRED FORTE) 1 % ophthalmic suspension Place 1 drop into both eyes 4 (four) times daily.   Yes Historical Provider, MD  pregabalin (LYRICA) 50 MG capsule Take 50 mg by mouth 3 (three) times daily.   Yes Historical Provider, MD  Insulin Pen Needle 29G X 12.7MM MISC 1 Device by Does not apply route 4 (four) times daily. 10/19/13   Jerald Kief, MD  nitroGLYCERIN (NITROSTAT) 0.4 MG SL tablet Place 0.4 mg under the tongue every 5 (five) minutes as needed for chest pain.    Historical Provider, MD    Physical Exam: Filed Vitals:   02/01/14 0030 02/01/14 0130 02/01/14 0220 02/01/14 0230  BP: 159/109 133/116  176/92  Pulse:  100 110   Temp:   97.8 F (36.6 C)   TempSrc:   Oral   Resp:   25   Height:    (1.549 m)   Weight:   84.2 kg (185  lb 10 oz)   SpO2:  93% 95%    General: Not in acute distress HEENT:       Eyes: PERRL, EOMI, no scleral icterus       ENT: No discharge from the ears and nose, no pharynx injection, no tonsillar enlargement.        Neck: No JVD, no bruit, no mass felt. Cardiac: S1/S2, RRR, No  murmurs, gallops or rubs Pulm: Good air movement bilaterally. Clear to auscultation bilaterally. No rales, wheezing, rhonchi or rubs. Abd: Soft, nondistended, nontender, no rebound pain, no organomegaly, BS present Ext: has mild (less than 1+) lower leg edema. 2+DP/PT pulse bilaterally Musculoskeletal: No joint deformities, erythema, or stiffness, ROM full Skin: No rashes.  Neuro: Alert and oriented X3, cranial nerves II-XII grossly intact, muscle strength 5/5 in all extremeties, sensation to light touch intact.  Psych: Patient is not psychotic, no suicidal or hemocidal ideation.  Labs on Admission:  Basic Metabolic Panel:  Recent Labs Lab 01/31/14 1503 01/31/14 2243 02/01/14 0408  NA 138 138 141  K 4.7 4.3 4.3  CL 102 103 108  CO2 16* 18* 18*  GLUCOSE 178* 134* 135*  BUN 30* 30* 26*  CREATININE 1.28* 1.27* 1.23*  CALCIUM 9.4 9.0 8.8   Liver Function Tests:  Recent Labs Lab 01/31/14 1503  AST 32  ALT 22  ALKPHOS 165*  BILITOT <0.2*  PROT 8.1  ALBUMIN 3.6   No results found for this basename: LIPASE, AMYLASE,  in the last 168 hours No results found for this basename: AMMONIA,  in the last 168 hours CBC:  Recent Labs Lab 01/31/14 1503 02/01/14 0408  WBC 8.9 7.8  HGB 8.9* 7.9*  HCT 30.7* 27.0*  MCV 70.9* 71.2*  PLT 290 270   Cardiac Enzymes:  Recent Labs Lab 02/01/14 0408  TROPONINI <0.30    BNP (last 3 results)  Recent Labs  05/24/13 1850 10/14/13 1653  PROBNP 2226.0* 409.0   CBG:  Recent Labs Lab 02/01/14 0049 02/01/14 0148 02/01/14 0305 02/01/14 0403 02/01/14 0452  GLUCAP 134* 138* 142* 130* 150*    Radiological Exams on Admission: Dg Chest 2  View  01/31/2014   CLINICAL DATA:  Hyperglycemia.  EXAM: CHEST  2 VIEW  COMPARISON:  10/14/2013  FINDINGS: Shallow inspiration The heart size and mediastinal contours are within normal limits. Both lungs are clear. The visualized skeletal structures are unremarkable.  IMPRESSION: No active cardiopulmonary disease.   Electronically Signed   By: Burman Nieves M.D.   On: 01/31/2014 21:38      Assessment/Plan Principal Problem:   High anion gap metabolic acidosis Active Problems:   HYPERTENSION   CKD (chronic kidney disease), stage III   UTI (lower urinary tract infection)   Diastolic congestive heart failure   DM (diabetes mellitus) type 2, uncontrolled, with ketoacidosis  1. Diabetes mellitus and elevated anion gap:  Patient has poorly controlled diabetes with last A1c was 13.4. Actually patient is currently taking Lantus and NovoLog at home, probably the dosage is not enough. On this admission, she developed anion gap acidosis with AG of 20 on admission. Initially this was thought to be due to DKA and started with insulin gtt by ED. But the the ketone is negative on UA. The elevated anion gap could be due to possible lactic acid elevation in the setting of UTI infection and CKD may have also contributed. It is also rare for type II DM pt to have DKA.  -Will admit to step down unit -continue DKA protocol for now, and check lactic acid level. If there is significantly elevated lactic acid, will d/c DKA protocol.  -IV fluid rate was set low at 75 cc per hour given her history of diastolic CHF. -Blood culture x2 -Treat UTI with IV ceftriaxone which was started by ED  2. CKD-III: Baseline creatinine is 1.03-1.4. On admission her creatinine is 1.27 which is close to  her baseline. -will monitor renal function by checking BMP  3. Diastolic congestive heart failure: 2d echo on 05/25/13 showed EF 55% with grade 1 diastolic dysfunction. Patient is not taking any diuretics at home. On admission there  is no signs of acute exacerbation. She has very mild lower leg edema. - Monitor her volume status very carefully - Gentle fluid as above - EKG - trop x 3  4. CAD: Currently no chest pain. Patient is on metoprolol and Imdur at home. Will continue home meds.  5. UTI: Asymptomatic, but her urinalysis is positive for UTI - will treat with IV ceftriaxone which was started by ED.   DVT ppx: SQ Heparin   Code Status: Full code Family Communication: None at bed side.              Disposition Plan: Admit to inpatient  Lorretta Harp [facility] Pager [phone]  If 7PM-7AM, please contact night-coverage [email or web address] Password [email or web address] 02/01/2014, 5:46 AM   Addendum:  The repeat BMP showed AG 15. I do not think that patient has DKA at this moment. Will d/c insulin gtt and start Lantus and SSI now. Will also d/c the IVF given her dCHF.   Lorretta Harp

## 2014-02-01 NOTE — Progress Notes (Addendum)
Patient ID: Glenda Gonzalez, female   DOB: 1934/01/11, 80 y.o.   MRN: [id number]  [facility] PROGRESS NOTE  TONY FRISCIA WUJ:[id number] DOB: [birth year] DOA: 01/31/2014 PCP: Billee Cashing, MD  Brief narrative: 80 y.o. female with poorly controlled diabetes (A1c 13.4 on 10/16/13), diastolic CHF, CAD, dementia, anemia, who presented to ED with elevated blood sugar level. The patient has been doing well until earlier prior to this admission when pt's husband noted that pt has been more slow than usual and when he checked CBG it was in 400's. She was brought to ED for further evaluation. She was found to have elevated anion gap of 20. She received 2 L of normal saline the ED and insulin infusion, the repeated BMP showed anion gap still at 17. UA was suggestive of UTI and pt was started on Rocephin. TRH admitted to SDU.   Assessment and Plan:    Principal Problem:   High anion gap metabolic acidosis - AG 15 this AM, pt tolerating current diet well - transitioned to Lantus with SSI - will continue same regimen - stable for discharge to telemetry bed - repeat BMP in AM Active Problems:   HYPERTENSION - reasonable inpatient control - continue to monitor per floor protocol  - continue metoprolol, Imdur, hydralazine    CKD (chronic kidney disease), stage III - Cr remains stable and at baseline - repeat BMP in AM   UTI (lower urinary tract infection) - will need to follow up on urine culture - continue Rocephin day #2   Acute on chronic blood loss anemia - slight drop in Hg likely dilutional - no signs of active bleeding - repeat CBC in AM   Diastolic congestive heart failure - clinically compensated - maintaining oxygen saturation at target range - stop IVF, monitor weights, I'S and O's - weight this Am is 185 lbs    DM (diabetes mellitus) type 2, uncontrolled, with complications of neuropathy, renal disease - continue Lantus and SSI    Severe PCM - secondary to multiple  medical conditions - advance diet as pt able to tolerate   DVT prophylaxis  Heparin SQ  Code Status: Full Family Communication: Pt at bedside Disposition Plan: Transfer to telemetry bed   IV Access:   Peripheral IV Procedures and diagnostic studies:   Dg Chest 2 View  01/31/2014   No active cardiopulmonary disease.    Medical Consultants:   None  Other Consultants:   Physical therapy  Anti-Infectives:   Rocephin 9/8 -->   Debbora Presto, MD  [facility] Pager [phone]  If 7PM-7AM, please contact night-coverage [email or web address] Password [email or web address] 02/01/2014, 4:16 PM   LOS: 1 day   HPI/Subjective: No events overnight.   Objective: Filed Vitals:   02/01/14 0954 02/01/14 1200 02/01/14 1315 02/01/14 1612  BP: 150/84 132/97 124/66 134/94  Pulse: 80 71  73  Temp:  98.6 F (37 C)  98.3 F (36.8 C)  TempSrc:  Oral  Oral  Resp:  20  26  Height:      Weight:      SpO2:  97%  97%    Intake/Output Summary (Last 24 hours) at 02/01/14 1616 Last data filed at 02/01/14 1300  Gross per 24 hour  Intake    600 ml  Output    900 ml  Net   -300 ml    Exam:   General:  Pt is alert, follows commands appropriately, not in acute distress  Cardiovascular: Regular rate and rhythm, S1/S2, no murmurs,  no rubs, no gallops  Respiratory: Clear to auscultation bilaterally, no wheezing, diminished breath sounds at bases  Abdomen: Soft, non tender, non distended, bowel sounds present, no guarding  Extremities: pulses DP and PT palpable bilaterally   Data Reviewed: Basic Metabolic Panel:  Recent Labs Lab 01/31/14 1503 01/31/14 2243 02/01/14 0408 02/01/14 0720 02/01/14 1015  NA 138 138 141 138 137  K 4.7 4.3 4.3 4.8 4.8  CL 102 103 108 108 106  CO2 16* 18* 18* 17* 16*  GLUCOSE 178* 134* 135* 138* 218*  BUN 30* 30* 26* 25* 25*  CREATININE 1.28* 1.27* 1.23* 1.22* 1.20*  CALCIUM 9.4 9.0 8.8 8.7 8.6   Liver Function Tests:  Recent Labs Lab 01/31/14 1503  AST 32  ALT 22   ALKPHOS 165*  BILITOT <0.2*  PROT 8.1  ALBUMIN 3.6   CBC:  Recent Labs Lab 01/31/14 1503 02/01/14 0408  WBC 8.9 7.8  HGB 8.9* 7.9*  HCT 30.7* 27.0*  MCV 70.9* 71.2*  PLT 290 270   Cardiac Enzymes:  Recent Labs Lab 02/01/14 0408 02/01/14 1015  TROPONINI <0.30 <0.30   CBG:  Recent Labs Lab 02/01/14 0403 02/01/14 0452 02/01/14 0559 02/01/14 0744 02/01/14 1155  GLUCAP 130* 150* 144* 145* 171*    Recent Results (from the past 240 hour(s))  MRSA PCR SCREENING     Status: None   Collection Time    02/01/14  2:10 AM      Result Value Ref Range Status   MRSA by PCR NEGATIVE  NEGATIVE Final   Comment:            The GeneXpert MRSA Assay (FDA     approved for NASAL specimens     only), is one component of a     comprehensive MRSA colonization     surveillance program. It is not     intended to diagnose MRSA     infection nor to guide or     monitor treatment for     MRSA infections.     Scheduled Meds: . cefTRIAXone (ROCEPHIN)  IV  1 g Intravenous QHS  . donepezil  10 mg Oral QHS  . heparin  5,000 Units Subcutaneous 3 times per day  . hydrALAZINE  25 mg Oral 3 times per day  . insulin aspart  0-15 Units Subcutaneous TID WC  . insulin glargine  12 Units Subcutaneous Daily  . isosorbide mononitrate  60 mg Oral Daily  . ketorolac  1 drop Both Eyes TID AC & HS  . latanoprost  1 drop Both Eyes QHS  . metoprolol tartrate  25 mg Oral BID  . pantoprazole  40 mg Oral Daily  . [START ON 02/02/2014] pneumococcal 23 valent vaccine  0.5 mL Intramuscular Tomorrow-1000  . polyvinyl alcohol  1 drop Both Eyes Daily  . prednisoLONE acetate  1 drop Both Eyes TID AC & HS  . pregabalin  50 mg Oral TID   Continuous Infusions:

## 2014-02-01 NOTE — Progress Notes (Signed)
Utilization Review Completed.Glenda Gonzalez T9/01/2014

## 2014-02-01 NOTE — Progress Notes (Signed)
Gave report to 5 west nurse. Pt is transferring to 5 west room 33. Pt is alert and oriented to self, location and situation. Pt has no pain. Pt is going on telemetry. Nurse assistant taking pt shortly.

## 2014-02-01 NOTE — Progress Notes (Signed)
Pt arrived to unit alert and oriented x4. Oriented to room, unit, and staff.  Bed in lowest position and call bell is within reach. Will continue to monitor. 

## 2014-02-01 NOTE — ED Notes (Signed)
Informed Amanda RN on 2C of stopping the insuline drip per the [employer]

## 2014-02-02 DIAGNOSIS — E1310 Other specified diabetes mellitus with ketoacidosis without coma: Secondary | ICD-10-CM | POA: Diagnosis not present

## 2014-02-02 LAB — GLUCOSE, CAPILLARY
GLUCOSE-CAPILLARY: 147 mg/dL — AB (ref 70–99)
Glucose-Capillary: 164 mg/dL — ABNORMAL HIGH (ref 70–99)
Glucose-Capillary: 150 mg/dL — ABNORMAL HIGH (ref 70–99)
Glucose-Capillary: 179 mg/dL — ABNORMAL HIGH (ref 70–99)

## 2014-02-02 LAB — BASIC METABOLIC PANEL
Anion gap: 15 (ref 5–15)
BUN: 29 mg/dL — ABNORMAL HIGH (ref 6–23)
CO2: 18 mEq/L — ABNORMAL LOW (ref 19–32)
Calcium: 9 mg/dL (ref 8.4–10.5)
Chloride: 105 mEq/L (ref 96–112)
Creatinine, Ser: 1.42 mg/dL — ABNORMAL HIGH (ref 0.50–1.10)
GFR, EST AFRICAN AMERICAN: 40 mL/min — AB (ref >90)
GFR, EST NON AFRICAN AMERICAN: 34 mL/min — AB (ref >90)
Glucose, Bld: 145 mg/dL — ABNORMAL HIGH (ref 70–99)
Potassium: 4.7 mEq/L (ref 3.7–5.3)
SODIUM: 138 meq/L (ref 137–147)

## 2014-02-02 LAB — CBC
HCT: 25.3 % — ABNORMAL LOW (ref 36.0–46.0)
Hemoglobin: 7.6 g/dL — ABNORMAL LOW (ref 12.0–15.0)
MCH: 21 pg — ABNORMAL LOW (ref 26.0–34.0)
MCHC: 30 g/dL (ref 30.0–36.0)
MCV: 69.9 fL — ABNORMAL LOW (ref 78.0–100.0)
Platelets: 238 10*3/uL (ref 150–400)
RBC: 3.62 MIL/uL — ABNORMAL LOW (ref 3.87–5.11)
RDW: 21.4 % — AB (ref 11.5–15.5)
WBC: 7.1 10*3/uL (ref 4.0–10.5)

## 2014-02-02 MED ORDER — METOCLOPRAMIDE HCL 5 MG/ML IJ SOLN
10.0000 mg | Freq: Once | INTRAMUSCULAR | Status: DC
  Filled 2014-02-02: qty 2

## 2014-02-02 MED ORDER — DIPHENHYDRAMINE HCL 50 MG/ML IJ SOLN
25.0000 mg | Freq: Once | INTRAMUSCULAR | Status: AC
  Administered 2014-02-02: 25 mg via INTRAVENOUS
  Filled 2014-02-02: qty 1

## 2014-02-02 MED ORDER — ONDANSETRON HCL 4 MG/2ML IJ SOLN
4.0000 mg | Freq: Four times a day (QID) | INTRAMUSCULAR | Status: DC | PRN
  Administered 2014-02-02 – 2014-02-03 (×2): 4 mg via INTRAVENOUS
  Filled 2014-02-02 (×2): qty 2

## 2014-02-02 NOTE — Evaluation (Signed)
Physical Therapy Evaluation Patient Details Name: Glenda Gonzalez MRN: [id number] DOB: [birth year] Today's Date: 02/02/2014   History of Present Illness  Patient is a 80 y/o female with PMH of poorly controlled diabetes (A1c 13.4 on 10/16/13), CHF, CAD, dementia and anemia, who presents to ED with elevated blood sugar. Urinalysis is positive for UTI. Admitted with hyperglycemia.   Clinical Impression  Patient presents with functional limitations due to deficits listed in PT problem list (see below). Pt with generalized weakness and impaired activity tolerance. Fatigues easily during exercise. Pt seems to have good support at home. Pt with history of dementia so not sure of accuracy of reported PLOF/history. Pt would benefit from acute PT and follow up HHPT to improve transfers, gait, balance and overall mobility so pt can maximize independence and ease burden of care at home.    Follow Up Recommendations Home health PT;Supervision/Assistance - 24 hour    Equipment Recommendations  None recommended by PT    Recommendations for Other Services       Precautions / Restrictions Precautions Precautions: Fall Restrictions Weight Bearing Restrictions: No      Mobility  Bed Mobility Overal bed mobility: Needs Assistance Bed Mobility: Rolling;Supine to Sit Rolling: Min guard   Supine to sit: Mod assist;HOB elevated     General bed mobility comments: Use of rail, HOB elevated. Mod A for elevation of trunk and for scooting to EOB as pt reaches for therapist's hand to assist.   Transfers Overall transfer level: Needs assistance Equipment used: Rolling walker (2 wheeled) Transfers: Sit to/from Stand Sit to Stand: Min assist;Min guard         General transfer comment: Min A on first standing bout with VC for hand placement, able to stand second time with Min guard assist for safety. Transferred to chair in room post ambulation, controlled descent and pt reaches  back.  Ambulation/Gait Ambulation/Gait assistance: Min guard Ambulation Distance (Feet): 12 Feet (+15' with 1 seated rest break.) Assistive device: Rolling walker (2 wheeled) Gait Pattern/deviations: Step-through pattern;Decreased stride length;Trunk flexed Gait velocity: decreased   General Gait Details: Pt with slow, mildly unsteady gait. Fatigues easily. VC to keep walker on ground and push it as pt picking it up. Longer seated rest break required between bouts due to fatigue.  Stairs            Wheelchair Mobility    Modified Rankin (Stroke Patients Only)       Balance Overall balance assessment: Needs assistance Sitting-balance support: No upper extremity supported;Feet supported Sitting balance-Leahy Scale: Fair Sitting balance - Comments: Able to sit EOB unsupported and perform hair combing without difficulty.      Standing balance-Leahy Scale: Poor Standing balance comment: Requires BUE support on RW for stability/safety.                              Pertinent Vitals/Pain Pain Assessment: No/denies pain    Home Living Family/patient expects to be discharged to:: Private residence Living Arrangements: Spouse/significant other;Children Available Help at Discharge: Family;Available 24 hours/day Type of Home: House Home Access: Ramped entrance     Home Layout: One level Home Equipment: Bedside commode;Wheelchair - [employer] - 2 wheels;Walker - 4 wheels;Shower seat      Prior Function Level of Independence: Needs assistance   Gait / Transfers Assistance Needed: Pt reports minimal ambulation only with assist and use of RW but mainly uses w/c.  ADL's / Homemaking Assistance Needed:  sponge bath bed level total (A) per patient. Reports assist with dressing. Does not do IADLs. Reports able to transfer to [facility] alone at times but does require assist sometimes too.        Hand Dominance   Dominant Hand: Right    Extremity/Trunk Assessment    Upper Extremity Assessment: Overall WFL for tasks assessed (Limited shoulder elevation bilaterally- premorbid.)           Lower Extremity Assessment: Generalized weakness;RLE deficits/detail;LLE deficits/detail         Communication      Cognition Arousal/Alertness: Awake/alert Behavior During Therapy: WFL for tasks assessed/performed Overall Cognitive Status:  (Pt able to recall date within session. Knew it was "september")                      General Comments      Exercises        Assessment/Plan    PT Assessment Patient needs continued PT services  PT Diagnosis Generalized weakness   PT Problem List Decreased strength;Decreased range of motion;Impaired sensation;Decreased activity tolerance;Decreased knowledge of use of DME;Decreased safety awareness;Decreased balance;Decreased mobility  PT Treatment Interventions Balance training;Gait training;DME instruction;Cognitive remediation;Patient/family education;Functional mobility training;Therapeutic activities;Therapeutic exercise   PT Goals (Current goals can be found in the Care Plan section) Acute Rehab PT Goals Patient Stated Goal: to be able to cook. PT Goal Formulation: With patient Time For Goal Achievement: 02/16/14 Potential to Achieve Goals: Good    Frequency Min 3X/week   Barriers to discharge        Co-evaluation               End of Session Equipment Utilized During Treatment: Gait belt Activity Tolerance: Patient limited by fatigue Patient left: in chair;with call bell/phone within reach Nurse Communication: Mobility status         Time: 0981-1914 PT Time Calculation (min): 35 min   Charges:   PT Evaluation $Initial PT Evaluation Tier I: 1 Procedure PT Treatments $Gait Training: 8-22 mins   PT G CodesAlvie Heidelberg A 02/02/2014, 11:17 AM Alvie Heidelberg, PT, DPT [phone]

## 2014-02-02 NOTE — Progress Notes (Signed)
Patient ID: Glenda Gonzalez, female   DOB: [birth year], 80 y.o.   MRN: [id number]  [facility] PROGRESS NOTE  Glenda Gonzalez YIR:[id number] DOB: 1934/01/02 DOA: 01/31/2014 PCP: Billee Cashing, MD  Brief narrative:  80 y.o. female with poorly controlled diabetes (A1c 13.4 on 10/16/13), diastolic CHF, CAD, dementia, anemia, who presented to ED with elevated blood sugar level. The patient has been doing well until earlier prior to this admission when pt's husband noted that pt has been more slow than usual and when he checked CBG it was in 400's. She was brought to ED for further evaluation. She was found to have elevated anion gap of 20. She received 2 L of normal saline the ED and insulin infusion, the repeated BMP showed anion gap still at 17. UA was suggestive of UTI and pt was started on Rocephin. TRH admitted to SDU.   Assessment and Plan:   Principal Problem:  High anion gap metabolic acidosis  - AG still elevated this AM, pt reports feeling better  - transitioned to Lantus with SSI  - will continue same regimen  - repeat BMP in AM  Active Problems:  HYPERTENSION  - reasonable inpatient control  - continue to monitor per floor protocol  - continue metoprolol, Imdur, hydralazine  CKD (chronic kidney disease), stage III  - Cr slightly up over the past 24 hours  - encourage PO intake  - repeat BMP in AM  UTI (lower urinary tract infection)  - will need to follow up on urine culture  - continue Rocephin day #3 Acute on chronic blood loss anemia  - slight drop in Hg likely dilutional  - no signs of active bleeding  - repeat CBC in AM  - transfuse for Hg < 7.5  Dementia - stable  - will ask for PT evaluation  Diastolic congestive heart failure  - mild crackles on exam this AM  - maintaining oxygen saturation at target range  - stopped IVF, monitor weights, I'S and O's  - weight trend: 185 lbs --> 187 lbs this AM  - will give low dose lasix 20 mg IV x one dose  DM  (diabetes mellitus) type 2, uncontrolled, with complications of neuropathy, renal disease  - continue Lantus and SSI  Severe PCM  - secondary to multiple medical conditions  - advance diet as pt able to tolerate   DVT prophylaxis  Heparin SQ  Code Status: Full  Family Communication: Pt at bedside  Disposition Plan: Awaiting PT input   IV Access:   Peripheral IV Procedures and diagnostic studies:   Dg Chest 2 View 01/31/2014 No active cardiopulmonary disease.  Medical Consultants:   None  Other Consultants:   Physical therapy  Anti-Infectives:   Rocephin 9/8 -->   Glenda Presto, MD  [facility] Pager [phone]  If 7PM-7AM, please contact night-coverage [email or web address] Password [email or web address] 02/02/2014, 11:09 AM   LOS: 2 days   HPI/Subjective: No events overnight.   Objective: Filed Vitals:   02/01/14 2145 02/02/14 0030 02/02/14 0528 02/02/14 0913  BP: 150/72 162/78 157/83 157/67  Pulse: 70 74 65 63  Temp:  98.4 F (36.9 C) 98.4 F (36.9 C) 98.3 F (36.8 C)  TempSrc:  Oral Oral Oral  Resp:  20 20 16   Height:      Weight:   85 kg (187 lb 6.3 oz)   SpO2:  96% 96% 95%    Intake/Output Summary (Last 24 hours) at 02/02/14 1109 Last data filed at 02/02/14 [phone]  Gross per 24 hour  Intake    490 ml  Output    751 ml  Net   -261 ml    Exam:   General:  Pt is alert, follows commands appropriately, not in acute distress  Cardiovascular: Regular rate and rhythm,  no rubs, no gallops  Respiratory: Clear to auscultation bilaterally, no wheezing, bibasilar crackles   Abdomen: Soft, non tender, non distended, bowel sounds present, no guarding  Extremities: No edema, pulses DP and PT palpable bilaterally  Neuro: Grossly nonfocal  Data Reviewed: Basic Metabolic Panel:  Recent Labs Lab 01/31/14 2243 02/01/14 0408 02/01/14 0720 02/01/14 1015 02/02/14 0723  NA 138 141 138 137 138  K 4.3 4.3 4.8 4.8 4.7  CL 103 108 108 106 105  CO2 18* 18* 17* 16* 18*  GLUCOSE 134*  135* 138* 218* 145*  BUN 30* 26* 25* 25* 29*  CREATININE 1.27* 1.23* 1.22* 1.20* 1.42*  CALCIUM 9.0 8.8 8.7 8.6 9.0   Liver Function Tests:  Recent Labs Lab 01/31/14 1503  AST 32  ALT 22  ALKPHOS 165*  BILITOT <0.2*  PROT 8.1  ALBUMIN 3.6   CBC:  Recent Labs Lab 01/31/14 1503 02/01/14 0408 02/02/14 0723  WBC 8.9 7.8 7.1  HGB 8.9* 7.9* 7.6*  HCT 30.7* 27.0* 25.3*  MCV 70.9* 71.2* 69.9*  PLT 290 270 238   Cardiac Enzymes:  Recent Labs Lab 02/01/14 0408 02/01/14 1015 02/01/14 1645  TROPONINI <0.30 <0.30 <0.30   CBG:  Recent Labs Lab 02/01/14 0744 02/01/14 1155 02/01/14 1705 02/01/14 2016 02/02/14 0814  GLUCAP 145* 171* 166* 199* 147*    Recent Results (from the past 240 hour(s))  URINE CULTURE     Status: None   Collection Time    01/31/14  8:20 PM      Result Value Ref Range Status   Specimen Description URINE, RANDOM   Final   Special Requests NONE   Final   Culture  Setup Time     Final   Value: 01/31/2014 22:43     Performed at [facility] Count     Final   Value: >=100,000 COLONIES/ML     Performed at [facility]   Culture     Final   Value: Multiple bacterial morphotypes present, none predominant. Suggest appropriate recollection if clinically indicated.     Performed at [facility]   Report Status 02/01/2014 FINAL   Final  MRSA PCR SCREENING     Status: None   Collection Time    02/01/14  2:10 AM      Result Value Ref Range Status   MRSA by PCR NEGATIVE  NEGATIVE Final   Comment:            The GeneXpert MRSA Assay (FDA     approved for NASAL specimens     only), is one component of a     comprehensive MRSA colonization     surveillance program. It is not     intended to diagnose MRSA     infection nor to guide or     monitor treatment for     MRSA infections.  CULTURE, BLOOD (ROUTINE X 2)     Status: None   Collection Time    02/01/14  4:08 AM      Result Value Ref Range Status   Specimen  Description BLOOD LEFT FOREARM   Final   Special Requests BOTTLES DRAWN AEROBIC ONLY 5CC  Final   Culture  Setup Time     Final   Value: 02/01/2014 12:24     Performed at [facility]   Culture     Final   Value:        BLOOD CULTURE RECEIVED NO GROWTH TO DATE CULTURE WILL BE HELD FOR 5 DAYS BEFORE ISSUING A FINAL NEGATIVE REPORT     Performed at [facility]   Report Status PENDING   Incomplete  CULTURE, BLOOD (ROUTINE X 2)     Status: None   Collection Time    02/01/14  4:15 AM      Result Value Ref Range Status   Specimen Description BLOOD LEFT HAND   Final   Special Requests BOTTLES DRAWN AEROBIC ONLY 2CC   Final   Culture  Setup Time     Final   Value: 02/01/2014 12:25     Performed at [facility]   Culture     Final   Value:        BLOOD CULTURE RECEIVED NO GROWTH TO DATE CULTURE WILL BE HELD FOR 5 DAYS BEFORE ISSUING A FINAL NEGATIVE REPORT     Performed at [facility]   Report Status PENDING   Incomplete     Scheduled Meds: . cefTRIAXone (ROCEPHIN)  IV  1 g Intravenous QHS  . donepezil  10 mg Oral QHS  . heparin  5,000 Units Subcutaneous 3 times per day  . hydrALAZINE  25 mg Oral 3 times per day  . insulin aspart  0-15 Units Subcutaneous TID WC  . insulin glargine  12 Units Subcutaneous Daily  . isosorbide mononitrate  60 mg Oral Daily  . ketorolac  1 drop Both Eyes TID AC & HS  . latanoprost  1 drop Both Eyes QHS  . metoCLOPramide (REGLAN) injection  10 mg Intravenous Once  . metoprolol tartrate  25 mg Oral BID  . pantoprazole  40 mg Oral Daily  . pneumococcal 23 valent vaccine  0.5 mL Intramuscular Tomorrow-1000  . polyvinyl alcohol  1 drop Both Eyes Daily  . prednisoLONE acetate  1 drop Both Eyes TID AC & HS  . pregabalin  50 mg Oral TID   Continuous Infusions:

## 2014-02-02 NOTE — Progress Notes (Signed)
Pt vomited and is complaining of headache. Schorr, NP paged. NP put in orders for zofran, reglan and benedryl. Will continue to monitor.

## 2014-02-02 NOTE — Progress Notes (Signed)
Pt tele off for a period of time due to RN and nurse tech giving pt bed bath. Tele put back on once bath was completed. Will continue to monitor pt. Judee Clara, RN

## 2014-02-03 LAB — BASIC METABOLIC PANEL
ANION GAP: 14 (ref 5–15)
BUN: 29 mg/dL — ABNORMAL HIGH (ref 6–23)
CHLORIDE: 104 meq/L (ref 96–112)
CO2: 16 mEq/L — ABNORMAL LOW (ref 19–32)
Calcium: 8.6 mg/dL (ref 8.4–10.5)
Creatinine, Ser: 1.46 mg/dL — ABNORMAL HIGH (ref 0.50–1.10)
GFR calc Af Amer: 39 mL/min — ABNORMAL LOW (ref >90)
GFR calc non Af Amer: 33 mL/min — ABNORMAL LOW (ref >90)
Glucose, Bld: 137 mg/dL — ABNORMAL HIGH (ref 70–99)
POTASSIUM: 4.4 meq/L (ref 3.7–5.3)
SODIUM: 134 meq/L — AB (ref 137–147)

## 2014-02-03 LAB — CBC
HCT: 23.6 % — ABNORMAL LOW (ref 36.0–46.0)
Hemoglobin: 7.1 g/dL — ABNORMAL LOW (ref 12.0–15.0)
MCH: 21.1 pg — ABNORMAL LOW (ref 26.0–34.0)
MCHC: 30.1 g/dL (ref 30.0–36.0)
MCV: 70 fL — ABNORMAL LOW (ref 78.0–100.0)
PLATELETS: 250 10*3/uL (ref 150–400)
RBC: 3.37 MIL/uL — ABNORMAL LOW (ref 3.87–5.11)
RDW: 21.4 % — AB (ref 11.5–15.5)
WBC: 5.5 10*3/uL (ref 4.0–10.5)

## 2014-02-03 LAB — GLUCOSE, CAPILLARY
GLUCOSE-CAPILLARY: 130 mg/dL — AB (ref 70–99)
GLUCOSE-CAPILLARY: 184 mg/dL — AB (ref 70–99)
GLUCOSE-CAPILLARY: 177 mg/dL — AB (ref 70–99)
Glucose-Capillary: 172 mg/dL — ABNORMAL HIGH (ref 70–99)

## 2014-02-03 LAB — PREPARE RBC (CROSSMATCH)

## 2014-02-03 MED ORDER — SODIUM CHLORIDE 0.9 % IV SOLN
Freq: Once | INTRAVENOUS | Status: AC
  Administered 2014-02-03: 14:00:00 via INTRAVENOUS

## 2014-02-03 MED ORDER — ACETAMINOPHEN 325 MG PO TABS
650.0000 mg | ORAL_TABLET | ORAL | Status: DC | PRN
  Administered 2014-02-03: 650 mg via ORAL
  Filled 2014-02-03 (×2): qty 2

## 2014-02-03 NOTE — Progress Notes (Signed)
PT Cancellation Note  Patient Details Name: ILZE GERDEMAN MRN: [id number] DOB: 04/17/1934   Cancelled Treatment:    Reason Eval/Treat Not Completed: Patient declined, no reason specified Attempted to treat patient again later in day however pt refused. Will follow up next available time.   Alvie Heidelberg A 02/03/2014, 1:31 PM Alvie Heidelberg, PT, DPT [phone]

## 2014-02-03 NOTE — ED Provider Notes (Signed)
Medical screening examination/treatment/procedure(s) were conducted as a shared visit with non-physician practitioner(s) and myself.  I personally evaluated the patient during the encounter.   EKG Interpretation None      Pt comes in with cc of elevated blood sugar. She is noted to have a UTI. There is also increased weakness per family. Exam is non focal. There is no overt sign of infection. Pt has AG of 20. Pt took insulin prior to ER arrival - so sugar is normal. Her bicarb was also low. ? Compensated DKA, starvation ketosis. Pt to be admitted for optimization.  Derwood Kaplan, MD 02/03/14 [phone]

## 2014-02-03 NOTE — Progress Notes (Signed)
PT Cancellation Note  Patient Details Name: Glenda Gonzalez MRN: [id number] DOB: Feb 15, 1934   Cancelled Treatment:    Reason Eval/Treat Not Completed: Patient declined, no reason specified Pt declined participating in therapy today, "I am just too weak and don't feel like it." Pt with Hgb 7.1- decreased from yesterday. To be getting blood transfusion later today. Will follow up in PM or next available time.   Alvie Heidelberg A 02/03/2014, 11:08 AM Alvie Heidelberg, PT, DPT [phone]

## 2014-02-03 NOTE — Care Management Note (Signed)
    Page 1 of 2   02/04/2014     1:39:21 PM CARE MANAGEMENT NOTE 02/04/2014  Patient:  Glenda Gonzalez, Glenda Gonzalez   Account Number:  [id number]  Date Initiated:  02/03/2014  Documentation initiated by:  Tomi Bamberger  Subjective/Objective Assessment:   dx met acidosis  admit- lives with spouse.  patient has a rolling walker, rollator and w/chair at home.     Action/Plan:   pt eval rec hhpt   Anticipated DC Date:  02/03/2014   Anticipated DC Plan:  [place]  CM consult      [facility] Choice  HOME HEALTH   Choice offered to / List presented to:  C-1 Patient        [place] arranged  [place]      [place]   Status of service:  Completed, signed off Medicare Important Message given?  YES (If response is "NO", the following Medicare IM given date fields will be blank) Date Medicare IM given:  02/03/2014 Medicare IM given by:  Tomi Bamberger Date Additional Medicare IM given:   Additional Medicare IM given by:    Discharge Disposition:    Per UR Regulation:  Reviewed for med. necessity/level of care/duration of stay  If discussed at [place] of Stay Meetings, dates discussed:    Comments:  02/04/14 09:30 CM met with 02/03/14 who accepts [facility] for HHRN/PT.  [facility] rep called to notify of discharge.  No other CM needs were communicated.  Mariane Masters, BSN, [place] [phone].  [place] RN, BSN [phone] patient chose [facility] for [place], [facility]  referral made to [place], message left for [facility] by vm.  Awaiting call back. Per Stanton Kidney with [facility] they can take patient.

## 2014-02-03 NOTE — Progress Notes (Signed)
Patient ID: Glenda Gonzalez, female   DOB: 12-08-1933, 80 y.o.   MRN: [id number]  [facility] PROGRESS NOTE  TAKIYAH BOHNSACK WUJ:[id number] DOB: Sep 03, 1933 DOA: 01/31/2014 PCP: Billee Cashing, MD  Brief narrative:  80 y.o. female with poorly controlled diabetes (A1c 13.4 on 10/16/13), diastolic CHF, CAD, dementia, anemia, who presented to ED with elevated blood sugar level. The patient has been doing well until earlier prior to this admission when pt's husband noted that pt has been more slow than usual and when he checked CBG it was in 400's. She was brought to ED for further evaluation. She was found to have elevated anion gap of 20. She received 2 L of normal saline the ED and insulin infusion, the repeated BMP showed anion gap still at 17. UA was suggestive of UTI and pt was started on Rocephin. TRH admitted to SDU.   Assessment and Plan:   Principal Problem:  High anion gap metabolic acidosis  - pt reports feeling better  - transitioned to Lantus with SSI  - will continue same regimen  - repeat BMP in AM  Active Problems:  HYPERTENSION  - reasonable inpatient control  - continue to monitor per floor protocol  - continue metoprolol, Imdur, hydralazine  CKD (chronic kidney disease), stage III  - Cr slightly up over the past 24 hours  - encourage PO intake  - repeat BMP in AM  UTI (lower urinary tract infection)  - will need to follow up on urine culture  - continue Rocephin day #4 Acute on chronic blood loss anemia  - drop in Hg - no signs of active bleeding  - repeat CBC in AM  - transfuse 2 U PRBC 9/11 Dementia  - stable  - PT done, rec's to go home with HHPT Diastolic congestive heart failure  - mild crackles on exam this AM  - maintaining oxygen saturation at target range  - stopped IVF, monitor weights, I'S and O's  - weight trend: 185 lbs --> 187 --> 190 lbs this AM   DM (diabetes mellitus) type 2, uncontrolled, with complications of neuropathy, renal  disease  - continue Lantus and SSI  Severe PCM  - secondary to multiple medical conditions  - advance diet as pt able to tolerate   DVT prophylaxis  Heparin SQ given inpatient but will hold for now due to Hg drop, change to SCD 9/11  Code Status: Full  Family Communication: Pt at bedside  Disposition Plan: Awaiting PT input   IV Access:   Peripheral IV Procedures and diagnostic studies:   Dg Chest 2 View 01/31/2014 No active cardiopulmonary disease.  Medical Consultants:   None  Other Consultants:   Physical therapy  Anti-Infectives:   Rocephin 9/8 -->    Debbora Presto, MD  [facility] Pager [phone]  If 7PM-7AM, please contact night-coverage [email or web address] Password [email or web address] 02/03/2014, 2:25 PM   LOS: 3 days   HPI/Subjective: No events overnight.   Objective: Filed Vitals:   02/02/14 2147 02/03/14 0541 02/03/14 1402 02/03/14 1416  BP: 146/78 137/70 111/59 118/60  Pulse: 67 74 68 70  Temp: 98.2 F (36.8 C) 98.3 F (36.8 C) 98.5 F (36.9 C) 98.5 F (36.9 C)  TempSrc: Oral Oral Oral Oral  Resp: Height:      Weight:  86.3 kg (190 lb 4.1 oz)    SpO2: 96% 97% 98%     Intake/Output Summary (Last 24 hours) at 02/03/14 1425 Last data filed at 02/03/14  1402  Gross per 24 hour  Intake    830 ml  Output   1000 ml  Net   -170 ml    Exam:   General:  Pt is alert, follows commands appropriately, not in acute distress  Cardiovascular: Regular rate and rhythm, no rubs, no gallops  Respiratory: Clear to auscultation bilaterally, no wheezing, no crackles, no rhonchi  Abdomen: Soft, non tender, non distended, bowel sounds present, no guarding  Data Reviewed: Basic Metabolic Panel:  Recent Labs Lab 02/01/14 0408 02/01/14 0720 02/01/14 1015 02/02/14 0723 02/03/14 0755  NA 141 138 137 138 134*  K 4.3 4.8 4.8 4.7 4.4  CL 108 108 106 105 104  CO2 18* 17* 16* 18* 16*  GLUCOSE 135* 138* 218* 145* 137*  BUN 26* 25* 25* 29* 29*  CREATININE 1.23* 1.22*  1.20* 1.42* 1.46*  CALCIUM 8.8 8.7 8.6 9.0 8.6   Liver Function Tests:  Recent Labs Lab 01/31/14 1503  AST 32  ALT 22  ALKPHOS 165*  BILITOT <0.2*  PROT 8.1  ALBUMIN 3.6   CBC:  Recent Labs Lab 01/31/14 1503 02/01/14 0408 02/02/14 0723 02/03/14 0755  WBC 8.9 7.8 7.1 5.5  HGB 8.9* 7.9* 7.6* 7.1*  HCT 30.7* 27.0* 25.3* 23.6*  MCV 70.9* 71.2* 69.9* 70.0*  PLT 290 270 238 250   Cardiac Enzymes:  Recent Labs Lab 02/01/14 0408 02/01/14 1015 02/01/14 1645  TROPONINI <0.30 <0.30 <0.30   CBG:  Recent Labs Lab 02/02/14 1136 02/02/14 1716 02/02/14 2102 02/03/14 0757 02/03/14 1219  GLUCAP 164* 150* 179* 130* 172*    Recent Results (from the past 240 hour(s))  URINE CULTURE     Status: None   Collection Time    01/31/14  8:20 PM      Result Value Ref Range Status   Specimen Description URINE, RANDOM   Final   Special Requests NONE   Final   Culture  Setup Time     Final   Value: 01/31/2014 22:43     Performed at [facility] Count     Final   Value: >=100,000 COLONIES/ML     Performed at [facility]   Culture     Final   Value: Multiple bacterial morphotypes present, none predominant. Suggest appropriate recollection if clinically indicated.     Performed at [facility]   Report Status 02/01/2014 FINAL   Final  MRSA PCR SCREENING     Status: None   Collection Time    02/01/14  2:10 AM      Result Value Ref Range Status   MRSA by PCR NEGATIVE  NEGATIVE Final   Comment:            The GeneXpert MRSA Assay (FDA     approved for NASAL specimens     only), is one component of a     comprehensive MRSA colonization     surveillance program. It is not     intended to diagnose MRSA     infection nor to guide or     monitor treatment for     MRSA infections.  CULTURE, BLOOD (ROUTINE X 2)     Status: None   Collection Time    02/01/14  4:08 AM      Result Value Ref Range Status   Specimen Description BLOOD LEFT FOREARM    Final   Special Requests BOTTLES DRAWN AEROBIC ONLY 5CC   Final   Culture  Setup Time     Final   Value: 02/01/2014 12:24     Performed at [facility]   Culture     Final   Value:        BLOOD CULTURE RECEIVED NO GROWTH TO DATE CULTURE WILL BE HELD FOR 5 DAYS BEFORE ISSUING A FINAL NEGATIVE REPORT     Performed at [facility]   Report Status PENDING   Incomplete  CULTURE, BLOOD (ROUTINE X 2)     Status: None   Collection Time    02/01/14  4:15 AM      Result Value Ref Range Status   Specimen Description BLOOD LEFT HAND   Final   Special Requests BOTTLES DRAWN AEROBIC ONLY 2CC   Final   Culture  Setup Time     Final   Value: 02/01/2014 12:25     Performed at [facility]   Culture     Final   Value:        BLOOD CULTURE RECEIVED NO GROWTH TO DATE CULTURE WILL BE HELD FOR 5 DAYS BEFORE ISSUING A FINAL NEGATIVE REPORT     Performed at [facility]   Report Status PENDING   Incomplete     Scheduled Meds: . sodium chloride   Intravenous Once  . cefTRIAXone (ROCEPHIN)  IV  1 g Intravenous QHS  . donepezil  10 mg Oral QHS  . heparin  5,000 Units Subcutaneous 3 times per day  . hydrALAZINE  25 mg Oral 3 times per day  . insulin aspart  0-15 Units Subcutaneous TID WC  . insulin glargine  12 Units Subcutaneous Daily  . isosorbide mononitrate  60 mg Oral Daily  . ketorolac  1 drop Both Eyes TID AC & HS  . latanoprost  1 drop Both Eyes QHS  . metoCLOPramide (REGLAN) injection  10 mg Intravenous Once  . metoprolol tartrate  25 mg Oral BID  . pantoprazole  40 mg Oral Daily  . polyvinyl alcohol  1 drop Both Eyes Daily  . prednisoLONE acetate  1 drop Both Eyes TID AC & HS  . pregabalin  50 mg Oral TID   Continuous Infusions:

## 2014-02-04 LAB — TYPE AND SCREEN
ABO/RH(D): B POS
Antibody Screen: NEGATIVE
Unit division: 0
Unit division: 0

## 2014-02-04 LAB — BASIC METABOLIC PANEL
ANION GAP: 18 — AB (ref 5–15)
BUN: 27 mg/dL — ABNORMAL HIGH (ref 6–23)
CALCIUM: 9.1 mg/dL (ref 8.4–10.5)
CO2: 15 mEq/L — ABNORMAL LOW (ref 19–32)
Chloride: 103 mEq/L (ref 96–112)
Creatinine, Ser: 1.3 mg/dL — ABNORMAL HIGH (ref 0.50–1.10)
GFR calc non Af Amer: 38 mL/min — ABNORMAL LOW (ref >90)
GFR, EST AFRICAN AMERICAN: 44 mL/min — AB (ref >90)
Glucose, Bld: 163 mg/dL — ABNORMAL HIGH (ref 70–99)
Potassium: 5.2 mEq/L (ref 3.7–5.3)
SODIUM: 136 meq/L — AB (ref 137–147)

## 2014-02-04 LAB — CBC
HCT: 32.2 % — ABNORMAL LOW (ref 36.0–46.0)
Hemoglobin: 10.2 g/dL — ABNORMAL LOW (ref 12.0–15.0)
MCH: 23.5 pg — ABNORMAL LOW (ref 26.0–34.0)
MCHC: 31.7 g/dL (ref 30.0–36.0)
MCV: 74.2 fL — AB (ref 78.0–100.0)
PLATELETS: 269 10*3/uL (ref 150–400)
RBC: 4.34 MIL/uL (ref 3.87–5.11)
RDW: 22.5 % — AB (ref 11.5–15.5)
WBC: 7.6 10*3/uL (ref 4.0–10.5)

## 2014-02-04 LAB — OCCULT BLOOD X 1 CARD TO LAB, STOOL: Fecal Occult Bld: NEGATIVE

## 2014-02-04 LAB — GLUCOSE, CAPILLARY: Glucose-Capillary: 161 mg/dL — ABNORMAL HIGH (ref 70–99)

## 2014-02-04 MED ORDER — CIPROFLOXACIN HCL 250 MG PO TABS: 250.0000 mg | ORAL_TABLET | Freq: Two times a day (BID) | ORAL | Status: DC

## 2014-02-04 NOTE — Discharge Summary (Signed)
Physician Discharge Summary  Glenda Gonzalez FAO:[id number] DOB: [birth year] DOA: 01/31/2014  PCP: Billee Cashing, MD  Admit date: 01/31/2014 Discharge date: 02/04/2014  Recommendations for Outpatient Follow-up:  1. Pt will need to follow up with PCP in 2-3 weeks post discharge 2. Please obtain BMP to evaluate electrolytes and kidney function 3. Please also check CBC to evaluate Hg and Hct levels  Discharge Diagnoses:  Principal Problem:   High anion gap metabolic acidosis Active Problems:   HYPERTENSION   CKD (chronic kidney disease), stage III   UTI (lower urinary tract infection)   Diastolic congestive heart failure   DM (diabetes mellitus) type 2, uncontrolled, with ketoacidosis   Discharge Condition: Stable  Diet recommendation: Heart healthy diet discussed in details   Brief narrative:  80 y.o. female with poorly controlled diabetes (A1c 13.4 on 10/16/13), diastolic CHF, CAD, dementia, anemia, who presented to ED with elevated blood sugar level. The patient has been doing well until earlier prior to this admission when pt's husband noted that pt has been more slow than usual and when he checked CBG it was in 400's. She was brought to ED for further evaluation. She was found to have elevated anion gap of 20. She received 2 L of normal saline the ED and insulin infusion, the repeated BMP showed anion gap still at 17. UA was suggestive of UTI and pt was started on Rocephin. TRH admitted to SDU.   Assessment and Plan:   Principal Problem:  High anion gap metabolic acidosis  - pt reports feeling better  - transitioned to Lantus with SSI  - will continue same regimen  - pt insisting on going home, says she is eating better and was ambulation with assistance   Active Problems:  HYPERTENSION  - reasonable inpatient control  - continue metoprolol, Imdur, hydralazine  CKD (chronic kidney disease), stage III  - Cr trending down since admission  - encouraged PO intake and pt  tolerating well  UTI (lower urinary tract infection)  - will need to follow up on urine culture  - continue Rocephin day #5 and transition to oral Cipro upon discharge to complete therapy  Acute on chronic blood loss anemia  - drop in Hg since admission  - transfused 2 U PRBC 9/11 with more than appropriate increase in HG  Dementia  - stable  - PT done, rec's to go home with HHPT  Diastolic congestive heart failure  - mild crackles on exam this AM  - maintaining oxygen saturation at target range  - stopped IVF, monitor weights, I'S and O's  - weight trend: 185 lbs --> 187 --> 190 --> 187 lbs this AM  DM (diabetes mellitus) type 2, uncontrolled, with complications of neuropathy, renal disease  - continue Lantus as per home medical regimen  Severe PCM  - secondary to multiple medical conditions  - pt tolerating current diet well  Code Status: Full  Family Communication: Pt at bedside  Disposition Plan: Home   IV Access:   Peripheral IV Procedures and diagnostic studies:   Dg Chest 2 View 01/31/2014 No active cardiopulmonary disease.  Medical Consultants:   None  Other Consultants:   Physical therapy  Anti-Infectives:   Rocephin 9/8 --> 9/12 Cipro 9/12 -->   Discharge Exam: Filed Vitals:   02/04/14 0546  BP: 160/61  Pulse: 63  Temp: 98.4 F (36.9 C)  Resp: 22   Filed Vitals:   02/03/14 2148 02/03/14 2322 02/03/14 2326 02/04/14 0546  BP: 158/62 184/83  160/61  Pulse: 63  71 63  Temp: 98.2 F (36.8 C)   98.4 F (36.9 C)  TempSrc: Oral   Oral  Resp: 22   22  Height:      Weight:    85.049 kg (187 lb 8 oz)  SpO2: 97%   92%    General: Pt is alert, follows commands appropriately, not in acute distress Cardiovascular: Regular rate and rhythm, no rubs, no gallops Respiratory: Clear to auscultation bilaterally, diminished breath sounds at bases  Abdominal: Soft, non tender, non distended, bowel sounds +, no guarding  Discharge Instructions  Discharge Instructions    Diet - low sodium heart healthy    Complete by:  As directed      Increase activity slowly    Complete by:  As directed             Medication List    TAKE these medications       albuterol 108 (90 BASE) MCG/ACT inhaler  Commonly known as:  PROVENTIL HFA;VENTOLIN HFA  Inhale 2 puffs into the lungs every 6 (six) hours as needed for wheezing or shortness of breath.     bimatoprost 0.01 % Soln  Commonly known as:  LUMIGAN  Place 1 drop into both eyes 2 (two) times daily.     ciprofloxacin 250 MG tablet  Commonly known as:  CIPRO  Take 1 tablet (250 mg total) by mouth 2 (two) times daily.     diphenhydrAMINE 25 mg capsule  Commonly known as:  BENADRYL  Take 25 mg by mouth at bedtime as needed for itching or sleep.     donepezil 10 MG tablet  Commonly known as:  ARICEPT  Take 10 mg by mouth at bedtime.     esomeprazole 40 MG capsule  Commonly known as:  NEXIUM  Take 40 mg by mouth daily.     insulin aspart 100 UNIT/ML FlexPen  Commonly known as:  NOVOLOG FLEXPEN  Inject 2 Units into the skin 3 (three) times daily with meals.     Insulin Glargine 100 UNIT/ML Solostar Pen  Commonly known as:  LANTUS  Inject 18 Units into the skin daily at 10 pm.     Insulin Pen Needle 29G X 12.7MM Misc  1 Device by Does not apply route 4 (four) times daily.     isosorbide mononitrate 60 MG 24 hr tablet  Commonly known as:  IMDUR  Take 1 tablet (60 mg total) by mouth daily.     ketorolac 0.4 % Soln  Commonly known as:  ACULAR  Place 1 drop into both eyes 4 (four) times daily.     metoprolol tartrate 25 MG tablet  Commonly known as:  LOPRESSOR  Take 1 tablet (25 mg total) by mouth 2 (two) times daily.     nitroGLYCERIN 0.4 MG SL tablet  Commonly known as:  NITROSTAT  Place 0.4 mg under the tongue every 5 (five) minutes as needed for chest pain.     prednisoLONE acetate 1 % ophthalmic suspension  Commonly known as:  PRED FORTE  Place 1 drop into both eyes 4 (four) times  daily.     pregabalin 50 MG capsule  Commonly known as:  LYRICA  Take 50 mg by mouth 3 (three) times daily.     SYSTANE ULTRA OP  Place 1 drop into both eyes daily as needed. For dry eyes      ASK your doctor about these medications  hydrALAZINE 25 MG tablet  Commonly known as:  APRESOLINE  Take 1 tablet (25 mg total) by mouth every 8 (eight) hours.           Follow-up Information   Schedule an appointment as soon as possible for a visit with Billee Cashing, MD.   Specialty:  Family Medicine   Contact information:   [address] [place] [address] [phone]       Follow up with Debbora Presto, MD. (As needed, If symptoms worsen)    Specialty:  Internal Medicine   Contact information:   [address] [place] [address] [phone]        The results of significant diagnostics from this hospitalization (including imaging, microbiology, ancillary and laboratory) are listed below for reference.     Microbiology: Recent Results (from the past 240 hour(s))  URINE CULTURE     Status: None   Collection Time    01/31/14  8:20 PM      Result Value Ref Range Status   Specimen Description URINE, RANDOM   Final   Special Requests NONE   Final   Culture  Setup Time     Final   Value: 01/31/2014 22:43     Performed at [facility] Count     Final   Value: >=100,000 COLONIES/ML     Performed at [facility]   Culture     Final   Value: Multiple bacterial morphotypes present, none predominant. Suggest appropriate recollection if clinically indicated.     Performed at [facility]   Report Status 02/01/2014 FINAL   Final  MRSA PCR SCREENING     Status: None   Collection Time    02/01/14  2:10 AM      Result Value Ref Range Status   MRSA by PCR NEGATIVE  NEGATIVE Final   Comment:            The GeneXpert MRSA Assay (FDA     approved for NASAL specimens     only), is one component of a      comprehensive MRSA colonization     surveillance program. It is not     intended to diagnose MRSA     infection nor to guide or     monitor treatment for     MRSA infections.  CULTURE, BLOOD (ROUTINE X 2)     Status: None   Collection Time    02/01/14  4:08 AM      Result Value Ref Range Status   Specimen Description BLOOD LEFT FOREARM   Final   Special Requests BOTTLES DRAWN AEROBIC ONLY 5CC   Final   Culture  Setup Time     Final   Value: 02/01/2014 12:24     Performed at [facility]   Culture     Final   Value:        BLOOD CULTURE RECEIVED NO GROWTH TO DATE CULTURE WILL BE HELD FOR 5 DAYS BEFORE ISSUING A FINAL NEGATIVE REPORT     Performed at [facility]   Report Status PENDING   Incomplete  CULTURE, BLOOD (ROUTINE X 2)     Status: None   Collection Time    02/01/14  4:15 AM      Result Value Ref Range Status   Specimen Description BLOOD LEFT HAND   Final   Special Requests BOTTLES DRAWN AEROBIC ONLY 2CC   Final  Culture  Setup Time     Final   Value: 02/01/2014 12:25     Performed at [facility]   Culture     Final   Value:        BLOOD CULTURE RECEIVED NO GROWTH TO DATE CULTURE WILL BE HELD FOR 5 DAYS BEFORE ISSUING A FINAL NEGATIVE REPORT     Performed at [facility]   Report Status PENDING   Incomplete     Labs: Basic Metabolic Panel:  Recent Labs Lab 02/01/14 0720 02/01/14 1015 02/02/14 0723 02/03/14 0755 02/04/14 0644  NA 138 137 138 134* 136*  K 4.8 4.8 4.7 4.4 5.2  CL 108 106 105 104 103  CO2 17* 16* 18* 16* 15*  GLUCOSE 138* 218* 145* 137* 163*  BUN 25* 25* 29* 29* 27*  CREATININE 1.22* 1.20* 1.42* 1.46* 1.30*  CALCIUM 8.7 8.6 9.0 8.6 9.1   Liver Function Tests:  Recent Labs Lab 01/31/14 1503  AST 32  ALT 22  ALKPHOS 165*  BILITOT <0.2*  PROT 8.1  ALBUMIN 3.6   No results found for this basename: LIPASE, AMYLASE,  in the last 168 hours No results found for this basename: AMMONIA,  in the last 168  hours CBC:  Recent Labs Lab 01/31/14 1503 02/01/14 0408 02/02/14 0723 02/03/14 0755 02/04/14 0644  WBC 8.9 7.8 7.1 5.5 7.6  HGB 8.9* 7.9* 7.6* 7.1* 10.2*  HCT 30.7* 27.0* 25.3* 23.6* 32.2*  MCV 70.9* 71.2* 69.9* 70.0* 74.2*  PLT 290 270 238 250 269   Cardiac Enzymes:  Recent Labs Lab 02/01/14 0408 02/01/14 1015 02/01/14 1645  TROPONINI <0.30 <0.30 <0.30   BNP: BNP (last 3 results)  Recent Labs  05/24/13 1850 10/14/13 1653  PROBNP 2226.0* 409.0   CBG:  Recent Labs Lab 02/03/14 0757 02/03/14 1219 02/03/14 1722 02/03/14 2229 02/04/14 0741  GLUCAP 130* 172* 184* 177* 161*     SIGNED: Time coordinating discharge: Over 30 minutes  Debbora Presto, MD  [facility] 02/04/2014, 9:08 AM Pager [phone]  If 7PM-7AM, please contact night-coverage [email or web address] Password [email or web address]

## 2014-02-04 NOTE — Progress Notes (Signed)
Glenda Gonzalez to be D/C'd Home per MD order.  Discussed with the patient and all questions fully answered.    Medication List         albuterol 108 (90 BASE) MCG/ACT inhaler  Commonly known as:  PROVENTIL HFA;VENTOLIN HFA  Inhale 2 puffs into the lungs every 6 (six) hours as needed for wheezing or shortness of breath.     bimatoprost 0.01 % Soln  Commonly known as:  LUMIGAN  Place 1 drop into both eyes 2 (two) times daily.     ciprofloxacin 250 MG tablet  Commonly known as:  CIPRO  Take 1 tablet (250 mg total) by mouth 2 (two) times daily.     diphenhydrAMINE 25 mg capsule  Commonly known as:  BENADRYL  Take 25 mg by mouth at bedtime as needed for itching or sleep.     donepezil 10 MG tablet  Commonly known as:  ARICEPT  Take 10 mg by mouth at bedtime.     esomeprazole 40 MG capsule  Commonly known as:  NEXIUM  Take 40 mg by mouth daily.     hydrALAZINE 25 MG tablet  Commonly known as:  APRESOLINE  Take 1 tablet (25 mg total) by mouth every 8 (eight) hours.     insulin aspart 100 UNIT/ML FlexPen  Commonly known as:  NOVOLOG FLEXPEN  Inject 2 Units into the skin 3 (three) times daily with meals.     Insulin Glargine 100 UNIT/ML Solostar Pen  Commonly known as:  LANTUS  Inject 18 Units into the skin daily at 10 pm.     Insulin Pen Needle 29G X 12.7MM Misc  1 Device by Does not apply route 4 (four) times daily.     isosorbide mononitrate 60 MG 24 hr tablet  Commonly known as:  IMDUR  Take 1 tablet (60 mg total) by mouth daily.     ketorolac 0.4 % Soln  Commonly known as:  ACULAR  Place 1 drop into both eyes 4 (four) times daily.     metoprolol tartrate 25 MG tablet  Commonly known as:  LOPRESSOR  Take 1 tablet (25 mg total) by mouth 2 (two) times daily.     nitroGLYCERIN 0.4 MG SL tablet  Commonly known as:  NITROSTAT  Place 0.4 mg under the tongue every 5 (five) minutes as needed for chest pain.     prednisoLONE acetate 1 % ophthalmic suspension   Commonly known as:  PRED FORTE  Place 1 drop into both eyes 4 (four) times daily.     pregabalin 50 MG capsule  Commonly known as:  LYRICA  Take 50 mg by mouth 3 (three) times daily.     SYSTANE ULTRA OP  Place 1 drop into both eyes daily as needed. For dry eyes        VVS per MD, Skin clean, dry and intact without evidence of skin break down, no evidence of skin tears noted. IV catheter discontinued intact. Site without signs and symptoms of complications. Dressing and pressure applied.  An After Visit Summary was printed and given to the patient.  D/c education completed with patient/family including follow up instructions, medication list, d/c activities limitations if indicated, with other d/c instructions as indicated by MD - patient able to verbalize understanding, all questions fully answered.   Patient instructed to return to ED, call 911, or call MD for any changes in condition.   Patient escorted via WC, and D/C home via private auto.  [place],  Thornell Mule 02/04/2014 9:21 AM

## 2014-02-04 NOTE — Discharge Instructions (Signed)

## 2014-02-07 LAB — CULTURE, BLOOD (ROUTINE X 2)
Culture: NO GROWTH
Culture: NO GROWTH

## 2014-04-07 ENCOUNTER — Encounter: Payer: Self-pay | Admitting: Cardiology

## 2014-05-17 ENCOUNTER — Other Ambulatory Visit: Payer: Self-pay | Admitting: Ophthalmology

## 2014-05-17 ENCOUNTER — Ambulatory Visit: Payer: Self-pay | Admitting: Ophthalmology

## 2014-06-14 ENCOUNTER — Inpatient Hospital Stay (HOSPITAL_COMMUNITY)
Admission: EM | Admit: 2014-06-14 | Discharge: 2014-06-22 | DRG: 189 | Disposition: A | Discharge status: Hospice | Payer: Medicare Other | Attending: Internal Medicine | Admitting: Internal Medicine

## 2014-06-14 ENCOUNTER — Emergency Department (HOSPITAL_COMMUNITY): Payer: Medicare Other

## 2014-06-14 ENCOUNTER — Encounter (HOSPITAL_COMMUNITY): Payer: Self-pay | Admitting: Emergency Medicine

## 2014-06-14 DIAGNOSIS — E11649 Type 2 diabetes mellitus with hypoglycemia without coma: Secondary | ICD-10-CM | POA: Diagnosis present

## 2014-06-14 DIAGNOSIS — R410 Disorientation, unspecified: Secondary | ICD-10-CM | POA: Insufficient documentation

## 2014-06-14 DIAGNOSIS — R0602 Shortness of breath: Secondary | ICD-10-CM | POA: Insufficient documentation

## 2014-06-14 DIAGNOSIS — E872 Acidosis: Secondary | ICD-10-CM | POA: Diagnosis present

## 2014-06-14 DIAGNOSIS — E1122 Type 2 diabetes mellitus with diabetic chronic kidney disease: Secondary | ICD-10-CM | POA: Diagnosis present

## 2014-06-14 DIAGNOSIS — Z8673 Personal history of transient ischemic attack (TIA), and cerebral infarction without residual deficits: Secondary | ICD-10-CM

## 2014-06-14 DIAGNOSIS — Z515 Encounter for palliative care: Secondary | ICD-10-CM | POA: Insufficient documentation

## 2014-06-14 DIAGNOSIS — E162 Hypoglycemia, unspecified: Secondary | ICD-10-CM | POA: Diagnosis present

## 2014-06-14 DIAGNOSIS — I509 Heart failure, unspecified: Secondary | ICD-10-CM

## 2014-06-14 DIAGNOSIS — I129 Hypertensive chronic kidney disease with stage 1 through stage 4 chronic kidney disease, or unspecified chronic kidney disease: Secondary | ICD-10-CM | POA: Diagnosis present

## 2014-06-14 DIAGNOSIS — I5021 Acute systolic (congestive) heart failure: Secondary | ICD-10-CM | POA: Diagnosis present

## 2014-06-14 DIAGNOSIS — Z66 Do not resuscitate: Secondary | ICD-10-CM | POA: Diagnosis present

## 2014-06-14 DIAGNOSIS — J9601 Acute respiratory failure with hypoxia: Principal | ICD-10-CM | POA: Diagnosis present

## 2014-06-14 DIAGNOSIS — I5030 Unspecified diastolic (congestive) heart failure: Secondary | ICD-10-CM | POA: Diagnosis present

## 2014-06-14 DIAGNOSIS — N179 Acute kidney failure, unspecified: Secondary | ICD-10-CM

## 2014-06-14 DIAGNOSIS — Z794 Long term (current) use of insulin: Secondary | ICD-10-CM

## 2014-06-14 DIAGNOSIS — R059 Cough, unspecified: Secondary | ICD-10-CM

## 2014-06-14 DIAGNOSIS — N183 Chronic kidney disease, stage 3 unspecified: Secondary | ICD-10-CM | POA: Diagnosis present

## 2014-06-14 DIAGNOSIS — Z8701 Personal history of pneumonia (recurrent): Secondary | ICD-10-CM

## 2014-06-14 DIAGNOSIS — Z79899 Other long term (current) drug therapy: Secondary | ICD-10-CM

## 2014-06-14 DIAGNOSIS — D509 Iron deficiency anemia, unspecified: Secondary | ICD-10-CM | POA: Diagnosis present

## 2014-06-14 DIAGNOSIS — E1129 Type 2 diabetes mellitus with other diabetic kidney complication: Secondary | ICD-10-CM | POA: Diagnosis present

## 2014-06-14 DIAGNOSIS — I10 Essential (primary) hypertension: Secondary | ICD-10-CM | POA: Diagnosis present

## 2014-06-14 DIAGNOSIS — D638 Anemia in other chronic diseases classified elsewhere: Secondary | ICD-10-CM | POA: Diagnosis present

## 2014-06-14 DIAGNOSIS — J9600 Acute respiratory failure, unspecified whether with hypoxia or hypercapnia: Secondary | ICD-10-CM

## 2014-06-14 DIAGNOSIS — F0390 Unspecified dementia without behavioral disturbance: Secondary | ICD-10-CM | POA: Diagnosis present

## 2014-06-14 DIAGNOSIS — I447 Left bundle-branch block, unspecified: Secondary | ICD-10-CM | POA: Diagnosis present

## 2014-06-14 DIAGNOSIS — K219 Gastro-esophageal reflux disease without esophagitis: Secondary | ICD-10-CM | POA: Diagnosis present

## 2014-06-14 DIAGNOSIS — N058 Unspecified nephritic syndrome with other morphologic changes: Secondary | ICD-10-CM

## 2014-06-14 DIAGNOSIS — I429 Cardiomyopathy, unspecified: Secondary | ICD-10-CM | POA: Diagnosis present

## 2014-06-14 DIAGNOSIS — E875 Hyperkalemia: Secondary | ICD-10-CM | POA: Diagnosis present

## 2014-06-14 DIAGNOSIS — I248 Other forms of acute ischemic heart disease: Secondary | ICD-10-CM | POA: Diagnosis present

## 2014-06-14 DIAGNOSIS — R778 Other specified abnormalities of plasma proteins: Secondary | ICD-10-CM

## 2014-06-14 DIAGNOSIS — G9340 Encephalopathy, unspecified: Secondary | ICD-10-CM | POA: Diagnosis present

## 2014-06-14 DIAGNOSIS — I2510 Atherosclerotic heart disease of native coronary artery without angina pectoris: Secondary | ICD-10-CM | POA: Diagnosis present

## 2014-06-14 DIAGNOSIS — R7989 Other specified abnormal findings of blood chemistry: Secondary | ICD-10-CM | POA: Diagnosis present

## 2014-06-14 DIAGNOSIS — R05 Cough: Secondary | ICD-10-CM

## 2014-06-14 DIAGNOSIS — R0603 Acute respiratory distress: Secondary | ICD-10-CM | POA: Insufficient documentation

## 2014-06-14 DIAGNOSIS — B974 Respiratory syncytial virus as the cause of diseases classified elsewhere: Secondary | ICD-10-CM | POA: Diagnosis present

## 2014-06-14 DIAGNOSIS — Z87891 Personal history of nicotine dependence: Secondary | ICD-10-CM

## 2014-06-14 DIAGNOSIS — J441 Chronic obstructive pulmonary disease with (acute) exacerbation: Secondary | ICD-10-CM | POA: Diagnosis present

## 2014-06-14 DIAGNOSIS — I5032 Chronic diastolic (congestive) heart failure: Secondary | ICD-10-CM | POA: Diagnosis present

## 2014-06-14 HISTORY — DX: Personal history of pneumonia (recurrent): Z87.01

## 2014-06-14 HISTORY — DX: Essential (primary) hypertension: I10

## 2014-06-14 HISTORY — DX: Type 2 diabetes mellitus without complications: E11.9

## 2014-06-14 HISTORY — DX: Chronic diastolic (congestive) heart failure: I50.32

## 2014-06-14 LAB — CBC WITH DIFFERENTIAL/PLATELET
Basophils Absolute: 0.1 10*3/uL (ref 0.0–0.1)
Basophils Relative: 1 % (ref 0–1)
EOS ABS: 0.1 10*3/uL (ref 0.0–0.7)
EOS PCT: 2 % (ref 0–5)
HCT: 34.4 % — ABNORMAL LOW (ref 36.0–46.0)
Hemoglobin: 10.9 g/dL — ABNORMAL LOW (ref 12.0–15.0)
Lymphocytes Relative: 20 % (ref 12–46)
Lymphs Abs: 1.4 10*3/uL (ref 0.7–4.0)
MCH: 25.8 pg — AB (ref 26.0–34.0)
MCHC: 31.7 g/dL (ref 30.0–36.0)
MCV: 81.5 fL (ref 78.0–100.0)
Monocytes Absolute: 0.7 10*3/uL (ref 0.1–1.0)
Monocytes Relative: 9 % (ref 3–12)
NEUTROS ABS: 5.1 10*3/uL (ref 1.7–7.7)
NEUTROS PCT: 69 % (ref 43–77)
PLATELETS: 176 10*3/uL (ref 150–400)
RBC: 4.22 MIL/uL (ref 3.87–5.11)
RDW: 18.5 % — ABNORMAL HIGH (ref 11.5–15.5)
WBC: 7.3 10*3/uL (ref 4.0–10.5)

## 2014-06-14 LAB — CBG MONITORING, ED
GLUCOSE-CAPILLARY: 34 mg/dL — AB (ref 70–99)
GLUCOSE-CAPILLARY: 46 mg/dL — AB (ref 70–99)
Glucose-Capillary: 156 mg/dL — ABNORMAL HIGH (ref 70–99)

## 2014-06-14 LAB — COMPREHENSIVE METABOLIC PANEL
ALBUMIN: 3.6 g/dL (ref 3.5–5.2)
ALT: 13 U/L (ref 0–35)
ANION GAP: 12 (ref 5–15)
AST: 24 U/L (ref 0–37)
Alkaline Phosphatase: 151 U/L — ABNORMAL HIGH (ref 39–117)
BILIRUBIN TOTAL: 0.6 mg/dL (ref 0.3–1.2)
BUN: 25 mg/dL — AB (ref 6–23)
CO2: 19 mmol/L (ref 19–32)
Calcium: 8.5 mg/dL (ref 8.4–10.5)
Chloride: 105 mEq/L (ref 96–112)
Creatinine, Ser: 1.35 mg/dL — ABNORMAL HIGH (ref 0.50–1.10)
GFR calc non Af Amer: 37 mL/min — ABNORMAL LOW (ref >90)
GFR, EST AFRICAN AMERICAN: 42 mL/min — AB (ref >90)
GLUCOSE: 36 mg/dL — AB (ref 70–99)
Potassium: 3.9 mmol/L (ref 3.5–5.1)
Sodium: 136 mmol/L (ref 135–145)
Total Protein: 8 g/dL (ref 6.0–8.3)

## 2014-06-14 LAB — BLOOD GAS, ARTERIAL
Acid-base deficit: 4.5 mmol/L — ABNORMAL HIGH (ref 0.0–2.0)
Bicarbonate: 19.1 mEq/L — ABNORMAL LOW (ref 20.0–24.0)
DRAWN BY: 31814
FIO2: 0.21 %
O2 Saturation: 90.3 %
PCO2 ART: 33 mmHg — AB (ref 35.0–45.0)
PH ART: 7.384 (ref 7.350–7.450)
Patient temperature: 100
TCO2: 17.6 mmol/L (ref 0–100)
pO2, Arterial: 65.7 mmHg — ABNORMAL LOW (ref 80.0–100.0)

## 2014-06-14 LAB — BRAIN NATRIURETIC PEPTIDE: B NATRIURETIC PEPTIDE 5: 463.7 pg/mL — AB (ref 0.0–100.0)

## 2014-06-14 LAB — TROPONIN I: Troponin I: 0.1 ng/mL — ABNORMAL HIGH (ref <0.031)

## 2014-06-14 MED ORDER — SODIUM CHLORIDE 0.9 % IJ SOLN
3.0000 mL | Freq: Two times a day (BID) | INTRAMUSCULAR | Status: DC
  Administered 2014-06-15 – 2014-06-16 (×2): 3 mL via INTRAVENOUS

## 2014-06-14 MED ORDER — PREDNISOLONE ACETATE 1 % OP SUSP
1.0000 [drp] | Freq: Four times a day (QID) | OPHTHALMIC | Status: DC
  Administered 2014-06-15 – 2014-06-22 (×28): 1 [drp] via OPHTHALMIC
  Filled 2014-06-14: qty 1

## 2014-06-14 MED ORDER — DOXYCYCLINE HYCLATE 100 MG PO TABS
100.0000 mg | ORAL_TABLET | Freq: Two times a day (BID) | ORAL | Status: DC
  Administered 2014-06-15 (×2): 100 mg via ORAL
  Filled 2014-06-14 (×3): qty 1

## 2014-06-14 MED ORDER — PREGABALIN 50 MG PO CAPS
50.0000 mg | ORAL_CAPSULE | Freq: Three times a day (TID) | ORAL | Status: DC
  Administered 2014-06-15 – 2014-06-17 (×8): 50 mg via ORAL
  Filled 2014-06-14 (×8): qty 1

## 2014-06-14 MED ORDER — IPRATROPIUM BROMIDE 0.02 % IN SOLN
0.5000 mg | Freq: Once | RESPIRATORY_TRACT | Status: AC
  Administered 2014-06-14: 0.5 mg via RESPIRATORY_TRACT
  Filled 2014-06-14: qty 2.5

## 2014-06-14 MED ORDER — DOCUSATE SODIUM 100 MG PO CAPS
100.0000 mg | ORAL_CAPSULE | Freq: Two times a day (BID) | ORAL | Status: DC
  Administered 2014-06-15 – 2014-06-17 (×6): 100 mg via ORAL
  Filled 2014-06-14 (×7): qty 1

## 2014-06-14 MED ORDER — DEXTROSE 50 % IV SOLN
INTRAVENOUS | Status: AC
  Filled 2014-06-14: qty 50

## 2014-06-14 MED ORDER — ALBUTEROL (5 MG/ML) CONTINUOUS INHALATION SOLN
10.0000 mg/h | INHALATION_SOLUTION | RESPIRATORY_TRACT | Status: AC
  Administered 2014-06-14: 10 mg/h via RESPIRATORY_TRACT
  Filled 2014-06-14: qty 20

## 2014-06-14 MED ORDER — SODIUM CHLORIDE 0.9 % IJ SOLN: 3.0000 mL | INTRAMUSCULAR | Status: DC | PRN

## 2014-06-14 MED ORDER — IPRATROPIUM BROMIDE 0.02 % IN SOLN
0.5000 mg | Freq: Four times a day (QID) | RESPIRATORY_TRACT | Status: DC
  Administered 2014-06-15 – 2014-06-22 (×30): 0.5 mg via RESPIRATORY_TRACT
  Filled 2014-06-14 (×30): qty 2.5

## 2014-06-14 MED ORDER — KETOROLAC TROMETHAMINE 0.5 % OP SOLN
1.0000 [drp] | Freq: Four times a day (QID) | OPHTHALMIC | Status: DC
  Administered 2014-06-15 – 2014-06-22 (×28): 1 [drp] via OPHTHALMIC
  Filled 2014-06-14: qty 3

## 2014-06-14 MED ORDER — ALBUTEROL SULFATE (2.5 MG/3ML) 0.083% IN NEBU: 2.5000 mg | INHALATION_SOLUTION | RESPIRATORY_TRACT | Status: DC | PRN

## 2014-06-14 MED ORDER — MOMETASONE FURO-FORMOTEROL FUM 100-5 MCG/ACT IN AERO
2.0000 | INHALATION_SPRAY | Freq: Two times a day (BID) | RESPIRATORY_TRACT | Status: DC
  Filled 2014-06-14: qty 8.8

## 2014-06-14 MED ORDER — ONDANSETRON HCL 4 MG PO TABS: 4.0000 mg | ORAL_TABLET | Freq: Four times a day (QID) | ORAL | Status: DC | PRN

## 2014-06-14 MED ORDER — ALBUTEROL SULFATE (2.5 MG/3ML) 0.083% IN NEBU: 5.0000 mg | INHALATION_SOLUTION | RESPIRATORY_TRACT | Status: DC | PRN

## 2014-06-14 MED ORDER — BISOPROLOL FUMARATE 10 MG PO TABS
10.0000 mg | ORAL_TABLET | Freq: Every day | ORAL | Status: DC
  Administered 2014-06-15 – 2014-06-17 (×3): 10 mg via ORAL
  Filled 2014-06-14 (×3): qty 2

## 2014-06-14 MED ORDER — ACETAMINOPHEN 650 MG RE SUPP
650.0000 mg | Freq: Four times a day (QID) | RECTAL | Status: DC | PRN
  Administered 2014-06-20: 650 mg via RECTAL
  Filled 2014-06-14: qty 1

## 2014-06-14 MED ORDER — METHYLPREDNISOLONE SODIUM SUCC 125 MG IJ SOLR
125.0000 mg | Freq: Once | INTRAMUSCULAR | Status: AC
  Administered 2014-06-14: 125 mg via INTRAVENOUS
  Filled 2014-06-14: qty 2

## 2014-06-14 MED ORDER — INSULIN ASPART 100 UNIT/ML ~~LOC~~ SOLN
0.0000 [IU] | SUBCUTANEOUS | Status: DC
  Administered 2014-06-15: 2 [IU] via SUBCUTANEOUS
  Administered 2014-06-15: 3 [IU] via SUBCUTANEOUS

## 2014-06-14 MED ORDER — METHYLPREDNISOLONE SODIUM SUCC 125 MG IJ SOLR
60.0000 mg | Freq: Two times a day (BID) | INTRAMUSCULAR | Status: DC
  Administered 2014-06-15: 60 mg via INTRAVENOUS
  Filled 2014-06-14 (×2): qty 0.96
  Filled 2014-06-14: qty 2

## 2014-06-14 MED ORDER — ISOSORBIDE MONONITRATE ER 60 MG PO TB24
60.0000 mg | ORAL_TABLET | Freq: Every day | ORAL | Status: DC
  Administered 2014-06-15 – 2014-06-17 (×3): 60 mg via ORAL
  Filled 2014-06-14 (×3): qty 1

## 2014-06-14 MED ORDER — DEXTROSE 50 % IV SOLN
25.0000 mL | Freq: Once | INTRAVENOUS | Status: DC
  Administered 2014-06-14: 25 mL via INTRAVENOUS
  Filled 2014-06-14: qty 50

## 2014-06-14 MED ORDER — PANTOPRAZOLE SODIUM 40 MG PO TBEC: 40.0000 mg | DELAYED_RELEASE_TABLET | Freq: Every day | ORAL | Status: DC

## 2014-06-14 MED ORDER — LEVALBUTEROL HCL 0.63 MG/3ML IN NEBU
0.6300 mg | INHALATION_SOLUTION | RESPIRATORY_TRACT | Status: DC | PRN
  Administered 2014-06-15 – 2014-06-17 (×2): 0.63 mg via RESPIRATORY_TRACT
  Filled 2014-06-14 (×5): qty 3

## 2014-06-14 MED ORDER — DONEPEZIL HCL 10 MG PO TABS
10.0000 mg | ORAL_TABLET | Freq: Every day | ORAL | Status: DC
  Administered 2014-06-15 – 2014-06-17 (×3): 10 mg via ORAL
  Filled 2014-06-14 (×4): qty 1

## 2014-06-14 MED ORDER — ALBUTEROL SULFATE (2.5 MG/3ML) 0.083% IN NEBU
5.0000 mg | INHALATION_SOLUTION | Freq: Once | RESPIRATORY_TRACT | Status: AC
  Administered 2014-06-14: 5 mg via RESPIRATORY_TRACT
  Filled 2014-06-14: qty 6

## 2014-06-14 MED ORDER — LATANOPROST 0.005 % OP SOLN
1.0000 [drp] | Freq: Every day | OPHTHALMIC | Status: DC
  Administered 2014-06-15 – 2014-06-21 (×8): 1 [drp] via OPHTHALMIC
  Filled 2014-06-14: qty 2.5

## 2014-06-14 MED ORDER — GUAIFENESIN ER 600 MG PO TB12
600.0000 mg | ORAL_TABLET | Freq: Two times a day (BID) | ORAL | Status: DC
  Administered 2014-06-15 (×2): 600 mg via ORAL
  Filled 2014-06-14 (×2): qty 1

## 2014-06-14 MED ORDER — ENOXAPARIN SODIUM 40 MG/0.4ML ~~LOC~~ SOLN
40.0000 mg | Freq: Every day | SUBCUTANEOUS | Status: DC
  Administered 2014-06-15 – 2014-06-16 (×2): 40 mg via SUBCUTANEOUS
  Filled 2014-06-14 (×3): qty 0.4

## 2014-06-14 MED ORDER — INSULIN GLARGINE 100 UNIT/ML ~~LOC~~ SOLN
10.0000 [IU] | Freq: Every day | SUBCUTANEOUS | Status: DC
  Administered 2014-06-15: 10 [IU] via SUBCUTANEOUS
  Filled 2014-06-14 (×2): qty 0.1

## 2014-06-14 MED ORDER — ASPIRIN 325 MG PO TABS: 325.0000 mg | ORAL_TABLET | Freq: Once | ORAL | Status: DC

## 2014-06-14 MED ORDER — ACETAMINOPHEN 325 MG PO TABS: 650.0000 mg | ORAL_TABLET | Freq: Four times a day (QID) | ORAL | Status: DC | PRN

## 2014-06-14 MED ORDER — SODIUM CHLORIDE 0.9 % IV SOLN
250.0000 mL | INTRAVENOUS | Status: DC | PRN
Start: 2014-06-14 — End: 2014-06-16

## 2014-06-14 MED ORDER — HYDRALAZINE HCL 25 MG PO TABS
25.0000 mg | ORAL_TABLET | Freq: Three times a day (TID) | ORAL | Status: DC
  Administered 2014-06-15 – 2014-06-16 (×4): 25 mg via ORAL
  Filled 2014-06-14 (×6): qty 1

## 2014-06-14 MED ORDER — ONDANSETRON HCL 4 MG/2ML IJ SOLN
4.0000 mg | Freq: Four times a day (QID) | INTRAMUSCULAR | Status: DC | PRN
  Administered 2014-06-15: 4 mg via INTRAVENOUS
  Filled 2014-06-14: qty 2

## 2014-06-14 NOTE — H&P (Signed)
PCP: Billee Cashing, MD    Chief Complaint:  Shortness of breath HPI: Glenda Gonzalez is a 81 y.o. female   has a past medical history of GERD (gastroesophageal reflux disease); Hypertension; Diverticulitis; Gout; Essential and other specified forms of tremor; Dementia; CAD (coronary artery disease); Anemia, chronic disease; Left arm pain; CHF (congestive heart failure); Pneumonia; Acute renal failure (ARF); Seizures; Arthritis; Diabetes mellitus; Stroke (2014); and CAD (04/25/2009).   Presented with  Patietn have had cold like symptoms for the past 2 months. Increased cough productive of white sputum. NO fever or chest pain. She is not on chronic oxygen. Patient started to have gradually worse wheezing and increase of work of breathing. Patient presented to emergency department required continuous nebulizer treatment. She states that currently her coughing has improved but she continues to have increased work of breathing. Patient was given Solu-Medrol 125 and continuous albuterol neb. He was noted the patient has troponin of 0.1 she denies any chest pain EKG showing left bundle branch block which is unchanged from prior. Patient states that she hasn't eaten all day. Her blood sugar was checked and was noticed down to 30s. Patient was fed and given 1 amp of D50 after which her blood sugars improved 156. She is not sure how much insulin she is taking at home states her son helps Of note she has history of left-sided weakness secondary to prior CVA.   Hospitalist was called for admission for COPD exacerbation  Review of Systems:    Pertinent positives include: shortness of breath at rest.  dyspnea on exertion, excess mucus,productive cough  wheezing.weakness,   Constitutional:  No weight loss, night sweats, Fevers, chills, fatigue, weight loss  HEENT:  No headaches, Difficulty swallowing,Tooth/dental problems,Sore throat,  No sneezing, itching, ear ache, nasal congestion, post nasal  drip,  Cardio-vascular:  No chest pain, Orthopnea, PND, anasarca, dizziness, palpitations.no Bilateral lower extremity swelling  GI:  No heartburn, indigestion, abdominal pain, nausea, vomiting, diarrhea, change in bowel habits, loss of appetite, melena, blood in stool, hematemesis Resp:  , No non-productive cough, No coughing up of blood.No change in color of mucus. Skin:  no rash or lesions. No jaundice GU:  no dysuria, change in color of urine, no urgency or frequency. No straining to urinate.  No flank pain.  Musculoskeletal:  No joint pain or no joint swelling. No decreased range of motion. No back pain.  Psych:  No change in mood or affect. No depression or anxiety. No memory loss.  Neuro: no localizing neurological complaints, no tingling, no no double vision, no gait abnormality, no slurred speech, no confusion  Otherwise ROS are negative except for above, 10 systems were reviewed  Past Medical History: Past Medical History  Diagnosis Date  . GERD (gastroesophageal reflux disease)   . Hypertension   . Diverticulitis   . Gout   . Essential and other specified forms of tremor   . Dementia   . CAD (coronary artery disease)   . Anemia, chronic disease   . Left arm pain   . CHF (congestive heart failure)     Diastolic dysfunction  . Pneumonia     child  . Acute renal failure (ARF)     denies  . Seizures     r/t stroke no rx  . Arthritis   . Diabetes mellitus     no rx for 6 months  . Stroke 2014  . CAD 04/25/2009    Qualifier: Diagnosis of  By: Shirlee Latch, MD,  [place]     Past Surgical History  Procedure Laterality Date  . Dilation and curettage of uterus    . Tumor removal      abdomen  . Abdominal hysterectomy    . Cataract extraction w/phaco Left 03/30/2013    Procedure: CATARACT EXTRACTION PHACO AND INTRAOCULAR LENS PLACEMENT (IOC);  Surgeon: Shade Flood, MD;  Location: [facility] OR;  Service: Ophthalmology;  Laterality: Left;  . Appendectomy    . Colonoscopy    .  Fracture surgery Left     foot and ankle  . Pars plana vitrectomy Left 12/28/2013    Procedure: PARS PLANA VITRECTOMY WITH 23 GAUGE LEFT EYE ;  Surgeon: Shade Flood, MD;  Location: [facility] OR;  Service: Ophthalmology;  Laterality: Left;     Medications: Prior to Admission medications   Medication Sig Start Date End Date Taking? Authorizing Provider  albuterol (PROVENTIL HFA;VENTOLIN HFA) 108 (90 BASE) MCG/ACT inhaler Inhale 2 puffs into the lungs every 6 (six) hours as needed for wheezing or shortness of breath. 05/29/13  Yes Jessica U Vann, DO  bimatoprost (LUMIGAN) 0.01 % SOLN Place 1 drop into both eyes 2 (two) times daily.    Yes Historical Provider, MD  dextromethorphan-guaiFENesin (MUCINEX DM) 30-600 MG per 12 hr tablet Take 1 tablet by mouth 2 (two) times daily.   Yes Historical Provider, MD  diphenhydrAMINE (BENADRYL) 25 mg capsule Take 25 mg by mouth at bedtime as needed for itching or sleep.    Yes Historical Provider, MD  donepezil (ARICEPT) 10 MG tablet Take 10 mg by mouth at bedtime.   Yes Historical Provider, MD  esomeprazole (NEXIUM) 40 MG capsule Take 40 mg by mouth daily.    Yes Historical Provider, MD  hydrALAZINE (APRESOLINE) 25 MG tablet Take 1 tablet (25 mg total) by mouth every 8 (eight) hours. 10/19/13  Yes Jerald Kief, MD  insulin NPH Human (HUMULIN N,NOVOLIN N) 100 UNIT/ML injection Inject into the skin as directed.   Yes Historical Provider, MD  Insulin Pen Needle 29G X 12.7MM MISC 1 Device by Does not apply route 4 (four) times daily. 10/19/13  Yes Jerald Kief, MD  ketorolac (ACULAR) 0.4 % SOLN Place 1 drop into both eyes 4 (four) times daily.   Yes Historical Provider, MD  metoprolol tartrate (LOPRESSOR) 25 MG tablet Take 1 tablet (25 mg total) by mouth 2 (two) times daily. 10/19/13  Yes Jerald Kief, MD  nitroGLYCERIN (NITROSTAT) 0.4 MG SL tablet Place 0.4 mg under the tongue every 5 (five) minutes as needed for chest pain.   Yes Historical Provider, MD  Polyethyl  Glycol-Propyl Glycol (SYSTANE ULTRA OP) Place 1 drop into both eyes daily as needed. For dry eyes   Yes Historical Provider, MD  prednisoLONE acetate (PRED FORTE) 1 % ophthalmic suspension Place 1 drop into both eyes 4 (four) times daily.   Yes Historical Provider, MD  pregabalin (LYRICA) 50 MG capsule Take 50 mg by mouth 3 (three) times daily.   Yes Historical Provider, MD  ciprofloxacin (CIPRO) 250 MG tablet Take 1 tablet (250 mg total) by mouth 2 (two) times daily. Patient not taking: Reported on 06/14/2014 02/04/14   Dorothea Ogle, MD  insulin aspart (NOVOLOG FLEXPEN) 100 UNIT/ML FlexPen Inject 2 Units into the skin 3 (three) times daily with meals. Patient not taking: Reported on 06/14/2014 10/19/13   Jerald Kief, MD  Insulin Glargine (LANTUS) 100 UNIT/ML Solostar Pen Inject 18 Units into the skin daily at 10 pm. Patient  not taking: Reported on 06/14/2014 10/19/13   Jerald Kief, MD  isosorbide mononitrate (IMDUR) 60 MG 24 hr tablet Take 1 tablet (60 mg total) by mouth daily. Patient not taking: Reported on 06/14/2014 10/19/13   Jerald Kief, MD    Allergies:   Allergies  Allergen Reactions  . Ace Inhibitors Shortness Of Breath and Other (See Comments)    Angioedema   . Codeine Other (See Comments)    hallucination  . Fentanyl Other (See Comments)    Abnormal behavior per husband  . Other Other (See Comments)    Beef and turkey-causes gout flareup  . Morphine Itching  . Penicillins Itching    Social History:  Ambulatory with walker or family Lives at home With family     reports that she quit smoking about 34 years ago. Her smoking use included Cigarettes. She has a 5 pack-year smoking history. She has never used smokeless tobacco. She reports that she does not drink alcohol or use illicit drugs.    Family History: family history includes Heart disease in her brother and father. There is no history of Colon cancer.    Physical Exam: Patient Vitals for the past 24  hrs:  BP Temp Temp src Pulse Resp SpO2  06/14/14 1814 157/84 mmHg - - 106 18 100 %  06/14/14 1749 - - - - - 96 %  06/14/14 1608 158/82 mmHg 100 F (37.8 C) Oral 95 20 96 %  06/14/14 1436 128/98 mmHg 98.2 F (36.8 C) Oral 103 18 97 %    1. General:  in No Acute distress 2. Psychological: Alert and  Oriented 3. Head/ENT:   Moist Mucous Membranes                          Head Non traumatic, neck supple                          Normal  Dentition 4. SKIN:  decreased Skin turgor,  Skin clean Dry and intact no rash 5. Heart: Regular rate and rhythm no Murmur, Rub or gallop 6. Lungs: some  wheezes no crackles   7. Abdomen: Soft, non-tender, Non distended 8. Lower extremities: no clubbing, cyanosis, trace edema 9. Neurologically diminished strength on the left patient states is chronic  10. MSK: Normal range of motion  body mass index is unknown because there is no weight on file.   Labs on Admission:   Results for orders placed or performed during the hospital encounter of 06/14/14 (from the past 24 hour(s))  CBC with Differential     Status: Abnormal   Collection Time: 06/14/14  5:01 PM  Result Value Ref Range   WBC 7.3 4.0 - 10.5 K/uL   RBC 4.22 3.87 - 5.11 MIL/uL   Hemoglobin 10.9 (L) 12.0 - 15.0 g/dL   HCT 16.1 (L) 09.6 - 04.5 %   MCV 81.5 78.0 - 100.0 fL   MCH 25.8 (L) 26.0 - 34.0 pg   MCHC 31.7 30.0 - 36.0 g/dL   RDW 40.9 (H) 81.1 - 91.4 %   Platelets 176 150 - 400 K/uL   Neutrophils Relative % 69 43 - 77 %   Neutro Abs 5.1 1.7 - 7.7 K/uL   Lymphocytes Relative 20 12 - 46 %   Lymphs Abs 1.4 0.7 - 4.0 K/uL   Monocytes Relative 9 3 - 12 %   Monocytes Absolute 0.7 0.1 -  1.0 K/uL   Eosinophils Relative 2 0 - 5 %   Eosinophils Absolute 0.1 0.0 - 0.7 K/uL   Basophils Relative 1 0 - 1 %   Basophils Absolute 0.1 0.0 - 0.1 K/uL  Comprehensive metabolic panel     Status: Abnormal   Collection Time: 06/14/14  5:01 PM  Result Value Ref Range   Sodium 136 135 - 145 mmol/L    Potassium 3.9 3.5 - 5.1 mmol/L   Chloride 105 96 - 112 mEq/L   CO2 19 19 - 32 mmol/L   Glucose, Bld 36 (LL) 70 - 99 mg/dL   BUN 25 (H) 6 - 23 mg/dL   Creatinine, Ser 8.34 (H) 0.50 - 1.10 mg/dL   Calcium 8.5 8.4 - 19.6 mg/dL   Total Protein 8.0 6.0 - 8.3 g/dL   Albumin 3.6 3.5 - 5.2 g/dL   AST 24 0 - 37 U/L   ALT 13 0 - 35 U/L   Alkaline Phosphatase 151 (H) 39 - 117 U/L   Total Bilirubin 0.6 0.3 - 1.2 mg/dL   GFR calc non Af Amer 37 (L) >90 mL/min   GFR calc Af Amer 42 (L) >90 mL/min   Anion gap 12 5 - 15  Brain natriuretic peptide     Status: Abnormal   Collection Time: 06/14/14  5:01 PM  Result Value Ref Range   B Natriuretic Peptide 463.7 (H) 0.0 - 100.0 pg/mL  Troponin I     Status: Abnormal   Collection Time: 06/14/14  5:01 PM  Result Value Ref Range   Troponin I 0.10 (H) <0.031 ng/mL  CBG monitoring, ED     Status: Abnormal   Collection Time: 06/14/14  6:24 PM  Result Value Ref Range   Glucose-Capillary 34 (LL) 70 - 99 mg/dL  CBG monitoring, ED     Status: Abnormal   Collection Time: 06/14/14  6:41 PM  Result Value Ref Range   Glucose-Capillary 46 (L) 70 - 99 mg/dL  POC CBG, ED     Status: Abnormal   Collection Time: 06/14/14  7:27 PM  Result Value Ref Range   Glucose-Capillary 156 (H) 70 - 99 mg/dL   Comment 1 Notify RN    Comment 2 Documented in Chart     UA not obtained  Lab Results  Component Value Date   HGBA1C 13.4* 10/16/2013    CrCl cannot be calculated (Unknown ideal weight.).  BNP (last 3 results)  Recent Labs  10/14/13 1653  PROBNP 409.0    Other results:  I have pearsonaly reviewed this: ECG REPORT  Rate:102   Rhythm: LBBB ST&T Change: n/a   There were no vitals filed for this visit.   Cultures:    Component Value Date/Time   SDES BLOOD LEFT HAND 02/01/2014 0415   SPECREQUEST BOTTLES DRAWN AEROBIC ONLY 2CC 02/01/2014 0415   CULT  02/01/2014 0415    NO GROWTH 5 DAYS Performed at [facility]   REPTSTATUS 02/07/2014  FINAL 02/01/2014 0415     Radiological Exams on Admission: Dg Chest 2 View  06/14/2014   CLINICAL DATA:  Productive cough for 2 months.  EXAM: CHEST  2 VIEW  COMPARISON:  None.  FINDINGS: The heart is mildly enlarged. There is tortuosity and calcification of the thoracic aorta. There are significant bronchitic changes with peribronchial thickening, increased interstitial markings and streaky areas of atelectasis. Findings could be due to severe bronchitis or atypical interstitial pneumonitis but no focal airspace consolidation or pleural effusion. The  bony thorax is intact.  IMPRESSION: Significant bronchitic changes without definite infiltrate.   Electronically Signed   By: Loralie Champagne M.D.   On: 06/14/2014 15:39    Chart has been reviewed  Assessment/Plan  81 year old female history of COPD, chronic kidney disease, diastolic heart failure, diabetes, presents with worsening shortness of breath and wheezing consistent COPD exacerbation was noted to have elevated troponin  0.1 no chest pain  Present on Admission:  . COPD exacerbation - - Will initiate Steroid taper, antibiotics, Albuterol PRN, scheduled Atrovent, Dulera and Mucinex. Titrate O2 to saturation >90%. Follow patients respiratory status. Admit to step down given increased work of breathing obtain ABG  . CKD (chronic kidney disease), stage III - chronic unchangedwill continue to monitor  . Diastolic congestive heart failure - we'll avoid fluid overload. Currently appears to be euvolemic  . DM (diabetes mellitus) type 2, - decreased dose of Lantus down to 10 from prescribed 18 given hypoglycemia this will need to probably adjusted to further given the patient is going to be on steroids  . Essential hypertension - continue home medications  . Hypoglycemia - hypoglycemia protocol frequent blood sugar checks hold Lantus for tonight and continue sliding scale patient is type II diabetic Elevated troponin with continued to cycle. Chest  pain-free this is unlikely to be acute coronary syndrome continue to monitor, if develops chest pain or noted rapidly rising troponin cardiology consult will be called Prophylaxis:   Lovenox, Protonix  CODE STATUS:  FULL CODE    Other plan as per orders.  I have spent a total of 55 min on this admission  Glenda Gonzalez 06/14/2014, 7:55 PM  [facility]  Pager [phone]   after 2 AM please page floor coverage PA If 7AM-7PM, please contact the day team taking care of the patient  [email or web address]  Password [email or web address]

## 2014-06-14 NOTE — ED Notes (Signed)
PA notified of abnormal lab 

## 2014-06-14 NOTE — Progress Notes (Signed)
  CARE MANAGEMENT ED NOTE 06/14/2014  Patient:  Glenda Gonzalez, Glenda Gonzalez   Account Number:  [id number]  Date Initiated:  06/14/2014  Documentation initiated by:  Radford Pax  Subjective/Objective Assessment:   Patient presents to ED with productive cough with white sputum and wheezing for 2 months.     Subjective/Objective Assessment Detail:   Chest xray: Significant bronchitic changes without definite infiltrate.  Temp 100, blood sugar 36 per lab.  PMHX of GERD, HTN, pneumonia CHF, ARF, stroke, CAD            Action/Plan:   Action/Plan Detail:   Anticipated DC Date:       Status Recommendation to Physician:   Result of Recommendation:    Other ED Services  Consult Working Plan    DC Planning Services  Other  PCP issues    Choice offered to / List presented to:            Status of service:  Completed, signed off  ED Comments:   ED Comments Detail:  EDCM spoke to patient at bedside.  Patient reports she lives at home with her husband and two sons.  Patient reports she does nto have home health services at this time but believes she may have had [facility] in the past.  Patient reports she has 24/7 care at home between her husband and her two sons.  Patient has a walker, bsc, wheelchair at home.  Patient reports her husband washes her at the bedside.  Patient reports she does not wear oxygen at home.  Patient reports , "I haven't walked in years." Patient went on to say, "My son's wife's nephew came and spent the night and brought me bed bugs!"  Patient reports she has been treated but still trying to get them out of the house.  Patient reports this was about a week ago. EDCM informed EDRN.  No further EDCM needs at this time.

## 2014-06-14 NOTE — ED Provider Notes (Signed)
CSN: [id number]     Arrival date & time 06/14/14  1422 History   First MD Initiated Contact with Patient 06/14/14 1458     Chief Complaint  Patient presents with  . Cough     (Consider location/radiation/quality/duration/timing/severity/associated sxs/prior Treatment) The history is provided by the patient and medical records. No language interpreter was used.     Glenda Gonzalez is a 81 y.o. female  with a hx of GERD, HTN, diverticulitis, dementia, CAD, CHF, anemia, CVA presents to the Emergency Department complaining of gradual, persistent, progressively worsening cough onset 2 mos ago but significantly worsening yesterday with severe SOB.  Pt reports 1 episode of post tussive emesis today.  She reports cough is productive of white sputum. She and her husband denies fevers or chills.  Associated symptoms include rhinorrhea, right ear pain and feeling of fullness.  Pt reports taking her medications, including her albuterol and Mucinex as directed.    Nothing makes it better and nothing makes it worse.  Pt denies fever, chills, headache, neck pain, abd pain, diarrhea, weakness, dizziness, syncope, swelling in her legs.   Pt reports chest pain yesterday worse with coughing and breathing that resolved last night and is not present today.     Past Medical History  Diagnosis Date  . GERD (gastroesophageal reflux disease)   . Hypertension   . Diverticulitis   . Gout   . Essential and other specified forms of tremor   . Dementia   . CAD (coronary artery disease)   . Anemia, chronic disease   . Left arm pain   . CHF (congestive heart failure)     Diastolic dysfunction  . Pneumonia     child  . Acute renal failure (ARF)     denies  . Seizures     r/t stroke no rx  . Arthritis   . Diabetes mellitus     no rx for 6 months  . Stroke 2014  . CAD 04/25/2009    Qualifier: Diagnosis of  By: Shirlee Latch, MD, [place]     Past Surgical History  Procedure Laterality Date  . Dilation and  curettage of uterus    . Tumor removal      abdomen  . Abdominal hysterectomy    . Cataract extraction w/phaco Left 03/30/2013    Procedure: CATARACT EXTRACTION PHACO AND INTRAOCULAR LENS PLACEMENT (IOC);  Surgeon: Shade Flood, MD;  Location: [facility] OR;  Service: Ophthalmology;  Laterality: Left;  . Appendectomy    . Colonoscopy    . Fracture surgery Left     foot and ankle  . Pars plana vitrectomy Left 12/28/2013    Procedure: PARS PLANA VITRECTOMY WITH 23 GAUGE LEFT EYE ;  Surgeon: Shade Flood, MD;  Location: [facility] OR;  Service: Ophthalmology;  Laterality: Left;   Family History  Problem Relation Age of Onset  . Colon cancer Neg Hx   . Heart disease Father   . Heart disease Brother    History  Substance Use Topics  . Smoking status: Former Smoker -- 0.50 packs/day for 10 years    Types: Cigarettes    Quit date: 03/24/1980  . Smokeless tobacco: Never Used  . Alcohol Use: No   OB History    No data available     Review of Systems  Constitutional: Negative for fever, diaphoresis, appetite change, fatigue and unexpected weight change.  HENT: Negative for mouth sores.   Eyes: Negative for visual disturbance.  Respiratory: Positive for cough, chest tightness, shortness  of breath and wheezing.   Cardiovascular: Positive for chest pain ( resolved).  Gastrointestinal: Negative for nausea, vomiting, abdominal pain, diarrhea and constipation.  Endocrine: Negative for polydipsia, polyphagia and polyuria.  Genitourinary: Negative for dysuria, urgency, frequency and hematuria.  Musculoskeletal: Negative for back pain and neck stiffness.  Skin: Negative for rash.  Allergic/Immunologic: Negative for immunocompromised state.  Neurological: Negative for syncope, light-headedness and headaches.  Hematological: Does not bruise/bleed easily.  Psychiatric/Behavioral: Negative for sleep disturbance. The patient is not nervous/anxious.       Allergies  Ace inhibitors; Codeine; Fentanyl; Other;  Morphine; and Penicillins  Home Medications   Prior to Admission medications   Medication Sig Start Date End Date Taking? Authorizing Provider  albuterol (PROVENTIL HFA;VENTOLIN HFA) 108 (90 BASE) MCG/ACT inhaler Inhale 2 puffs into the lungs every 6 (six) hours as needed for wheezing or shortness of breath. 05/29/13  Yes Jessica U Vann, DO  bimatoprost (LUMIGAN) 0.01 % SOLN Place 1 drop into both eyes 2 (two) times daily.    Yes Historical Provider, MD  dextromethorphan-guaiFENesin (MUCINEX DM) 30-600 MG per 12 hr tablet Take 1 tablet by mouth 2 (two) times daily.   Yes Historical Provider, MD  diphenhydrAMINE (BENADRYL) 25 mg capsule Take 25 mg by mouth at bedtime as needed for itching or sleep.    Yes Historical Provider, MD  donepezil (ARICEPT) 10 MG tablet Take 10 mg by mouth at bedtime.   Yes Historical Provider, MD  esomeprazole (NEXIUM) 40 MG capsule Take 40 mg by mouth daily.    Yes Historical Provider, MD  hydrALAZINE (APRESOLINE) 25 MG tablet Take 1 tablet (25 mg total) by mouth every 8 (eight) hours. 10/19/13  Yes Jerald Kief, MD  insulin NPH Human (HUMULIN N,NOVOLIN N) 100 UNIT/ML injection Inject into the skin as directed.   Yes Historical Provider, MD  Insulin Pen Needle 29G X 12.7MM MISC 1 Device by Does not apply route 4 (four) times daily. 10/19/13  Yes Jerald Kief, MD  ketorolac (ACULAR) 0.4 % SOLN Place 1 drop into both eyes 4 (four) times daily.   Yes Historical Provider, MD  metoprolol tartrate (LOPRESSOR) 25 MG tablet Take 1 tablet (25 mg total) by mouth 2 (two) times daily. 10/19/13  Yes Jerald Kief, MD  nitroGLYCERIN (NITROSTAT) 0.4 MG SL tablet Place 0.4 mg under the tongue every 5 (five) minutes as needed for chest pain.   Yes Historical Provider, MD  Polyethyl Glycol-Propyl Glycol (SYSTANE ULTRA OP) Place 1 drop into both eyes daily as needed. For dry eyes   Yes Historical Provider, MD  prednisoLONE acetate (PRED FORTE) 1 % ophthalmic suspension Place 1 drop into  both eyes 4 (four) times daily.   Yes Historical Provider, MD  pregabalin (LYRICA) 50 MG capsule Take 50 mg by mouth 3 (three) times daily.   Yes Historical Provider, MD  ciprofloxacin (CIPRO) 250 MG tablet Take 1 tablet (250 mg total) by mouth 2 (two) times daily. Patient not taking: Reported on 06/14/2014 02/04/14   Dorothea Ogle, MD  insulin aspart (NOVOLOG FLEXPEN) 100 UNIT/ML FlexPen Inject 2 Units into the skin 3 (three) times daily with meals. Patient not taking: Reported on 06/14/2014 10/19/13   Jerald Kief, MD  Insulin Glargine (LANTUS) 100 UNIT/ML Solostar Pen Inject 18 Units into the skin daily at 10 pm. Patient not taking: Reported on 06/14/2014 10/19/13   Jerald Kief, MD  isosorbide mononitrate (IMDUR) 60 MG 24 hr tablet Take 1 tablet (60 mg  total) by mouth daily. Patient not taking: Reported on 06/14/2014 10/19/13   Jerald Kief, MD   BP 157/84 mmHg  Pulse 106  Temp(Src) 100 F (37.8 C) (Oral)  Resp 18  SpO2 100% Physical Exam  Constitutional: She is oriented to person, place, and time. She appears well-developed and well-nourished. No distress.  Awake, alert, nontoxic appearance  HENT:  Head: Normocephalic and atraumatic.  Right Ear: Tympanic membrane, external ear and ear canal normal.  Left Ear: Tympanic membrane, external ear and ear canal normal.  Nose: Mucosal edema and rhinorrhea present. No epistaxis. Right sinus exhibits no maxillary sinus tenderness and no frontal sinus tenderness. Left sinus exhibits no maxillary sinus tenderness and no frontal sinus tenderness.  Mouth/Throat: Uvula is midline, oropharynx is clear and moist and mucous membranes are normal. Mucous membranes are not pale and not cyanotic. No oropharyngeal exudate, posterior oropharyngeal edema, posterior oropharyngeal erythema or tonsillar abscesses.  Eyes: Conjunctivae are normal. Pupils are equal, round, and reactive to light. No scleral icterus.  Neck: Normal range of motion and full passive range  of motion without pain. Neck supple.  Cardiovascular: Regular rhythm, normal heart sounds and intact distal pulses.  Tachycardia present.   Pulses:      Radial pulses are 2+ on the right side, and 2+ on the left side.  Mild tachycardia  Pulmonary/Chest: Effort normal. No stridor. No respiratory distress. She has decreased breath sounds. She has wheezes. She has rhonchi.  Decreased breath sounds throughout with wheezes, rhonchi.    Abdominal: Soft. Bowel sounds are normal. She exhibits no mass. There is no tenderness. There is no rebound and no guarding.  Musculoskeletal: Normal range of motion. She exhibits no edema.  No peripheral edema  Lymphadenopathy:    She has no cervical adenopathy.  Neurological: She is alert and oriented to person, place, and time.  Speech is clear and goal oriented Moves extremities without ataxia  Skin: Skin is warm and dry. No rash noted. She is not diaphoretic.  Psychiatric: She has a normal mood and affect.  Nursing note and vitals reviewed.   ED Course  Procedures (including critical care time) Labs Review Labs Reviewed  CBC WITH DIFFERENTIAL - Abnormal; Notable for the following:    Hemoglobin 10.9 (*)    HCT 34.4 (*)    MCH 25.8 (*)    RDW 18.5 (*)    All other components within normal limits  COMPREHENSIVE METABOLIC PANEL - Abnormal; Notable for the following:    Glucose, Bld 36 (*)    BUN 25 (*)    Creatinine, Ser 1.35 (*)    Alkaline Phosphatase 151 (*)    GFR calc non Af Amer 37 (*)    GFR calc Af Amer 42 (*)    All other components within normal limits  BRAIN NATRIURETIC PEPTIDE - Abnormal; Notable for the following:    B Natriuretic Peptide 463.7 (*)    All other components within normal limits  TROPONIN I - Abnormal; Notable for the following:    Troponin I 0.10 (*)    All other components within normal limits  CBG MONITORING, ED - Abnormal; Notable for the following:    Glucose-Capillary 34 (*)    All other components within  normal limits  CBG MONITORING, ED - Abnormal; Notable for the following:    Glucose-Capillary 46 (*)    All other components within normal limits  CBG MONITORING, ED - Abnormal; Notable for the following:    Glucose-Capillary 156 (*)  All other components within normal limits    Imaging Review Dg Chest 2 View  06/14/2014   CLINICAL DATA:  Productive cough for 2 months.  EXAM: CHEST  2 VIEW  COMPARISON:  None.  FINDINGS: The heart is mildly enlarged. There is tortuosity and calcification of the thoracic aorta. There are significant bronchitic changes with peribronchial thickening, increased interstitial markings and streaky areas of atelectasis. Findings could be due to severe bronchitis or atypical interstitial pneumonitis but no focal airspace consolidation or pleural effusion. The bony thorax is intact.  IMPRESSION: Significant bronchitic changes without definite infiltrate.   Electronically Signed   By: Loralie Champagne M.D.   On: 06/14/2014 15:39     EKG Interpretation   Date/Time:  Wednesday June 14 2014 16:09:45 EST Ventricular Rate:  102 PR Interval:  375 QRS Duration: 158 QT Interval:  431 QTC Calculation: 561 R Axis:   -37 Text Interpretation:  Sinus tachycardia Prolonged PR interval Biatrial  enlargement Left bundle branch block Baseline wander in lead(s) I III aVL  V3 No significant change since last tracing Confirmed by YAO  MD, DAVID  ([id number]) on 06/14/2014 5:58:52 PM      MDM   Final diagnoses:  Cough  SOB (shortness of breath)  COPD exacerbation  CKD (chronic kidney disease), stage III  LBBB (left bundle branch block)  Elevated troponin   Merilynn Finland presents with cough, congestion, SOB and wheezing. Concern for chronic bronchitis exacerbation, URI or CHF exacerbation.  Will give albuterol, check labs and reassess.    7:19 PM Patient with multiple lab abnormalities. Her CMP returned with a hypoglycemia 36. Patient was given orange juice and sandwich  with improvement only to 46.  D50 (1/2 amp) given.    Pt also with elevated troponin to 0.10.  This is likely secondary to her COPD exacerbation. She strictly denies chest pain. Patient with mild elevations of serum creatinine at baseline.  She has been given a total of 15 mg of albuterol, 1 mg of Atrovent and Solu-Medrol without resolution of her wheezes. She remains to get neck and obviously short of breath. I do not believe she is stable for discharge home. Will proceed with admission.  The patient was discussed with and seen by Dr. Silverio Lay who agrees with the treatment plan.   7:43 PM Pt discussed with Dr. Felipa Furnace who will admit to Step-down.  Pt CBG improved to 156 prior to admin of D50.  It was not administered due to this.    BP 157/84 mmHg  Pulse 106  Temp(Src) 100 F (37.8 C) (Oral)  Resp 18  SpO2 100%   Dierdre Forth, PA-C 06/14/14 1944  Richardean Canal, MD 06/17/14 [phone]

## 2014-06-14 NOTE — ED Notes (Signed)
Pt c/o productive cough with white mucus x 2 months, pt seen twice, MD prescribed Mucinex D, no relief. Expiratory wheezes present. Pt states emesis began this morning.

## 2014-06-14 NOTE — ED Notes (Signed)
Labs x1 unsuccessful. Gerilyn Pilgrim NT in to draw labs now

## 2014-06-14 NOTE — ED Notes (Signed)
Pt given ham sandwich and 2 cups of orange juice; will recheck CBG

## 2014-06-15 ENCOUNTER — Inpatient Hospital Stay (HOSPITAL_COMMUNITY): Payer: Medicare Other

## 2014-06-15 DIAGNOSIS — I248 Other forms of acute ischemic heart disease: Secondary | ICD-10-CM | POA: Diagnosis present

## 2014-06-15 DIAGNOSIS — N183 Chronic kidney disease, stage 3 (moderate): Secondary | ICD-10-CM | POA: Diagnosis present

## 2014-06-15 DIAGNOSIS — I2510 Atherosclerotic heart disease of native coronary artery without angina pectoris: Secondary | ICD-10-CM | POA: Diagnosis present

## 2014-06-15 DIAGNOSIS — Z8701 Personal history of pneumonia (recurrent): Secondary | ICD-10-CM | POA: Diagnosis not present

## 2014-06-15 DIAGNOSIS — I5021 Acute systolic (congestive) heart failure: Secondary | ICD-10-CM | POA: Diagnosis present

## 2014-06-15 DIAGNOSIS — D509 Iron deficiency anemia, unspecified: Secondary | ICD-10-CM | POA: Diagnosis present

## 2014-06-15 DIAGNOSIS — E875 Hyperkalemia: Secondary | ICD-10-CM | POA: Diagnosis present

## 2014-06-15 DIAGNOSIS — Z79899 Other long term (current) drug therapy: Secondary | ICD-10-CM | POA: Diagnosis not present

## 2014-06-15 DIAGNOSIS — E11649 Type 2 diabetes mellitus with hypoglycemia without coma: Secondary | ICD-10-CM | POA: Diagnosis present

## 2014-06-15 DIAGNOSIS — R0602 Shortness of breath: Secondary | ICD-10-CM | POA: Diagnosis present

## 2014-06-15 DIAGNOSIS — F0390 Unspecified dementia without behavioral disturbance: Secondary | ICD-10-CM | POA: Diagnosis present

## 2014-06-15 DIAGNOSIS — Z794 Long term (current) use of insulin: Secondary | ICD-10-CM | POA: Diagnosis not present

## 2014-06-15 DIAGNOSIS — J9601 Acute respiratory failure with hypoxia: Secondary | ICD-10-CM | POA: Diagnosis present

## 2014-06-15 DIAGNOSIS — E1129 Type 2 diabetes mellitus with other diabetic kidney complication: Secondary | ICD-10-CM

## 2014-06-15 DIAGNOSIS — Z515 Encounter for palliative care: Secondary | ICD-10-CM | POA: Diagnosis not present

## 2014-06-15 DIAGNOSIS — J441 Chronic obstructive pulmonary disease with (acute) exacerbation: Secondary | ICD-10-CM | POA: Diagnosis present

## 2014-06-15 DIAGNOSIS — E162 Hypoglycemia, unspecified: Secondary | ICD-10-CM

## 2014-06-15 DIAGNOSIS — I5032 Chronic diastolic (congestive) heart failure: Secondary | ICD-10-CM | POA: Diagnosis present

## 2014-06-15 DIAGNOSIS — E1122 Type 2 diabetes mellitus with diabetic chronic kidney disease: Secondary | ICD-10-CM | POA: Diagnosis present

## 2014-06-15 DIAGNOSIS — E872 Acidosis: Secondary | ICD-10-CM | POA: Diagnosis present

## 2014-06-15 DIAGNOSIS — I10 Essential (primary) hypertension: Secondary | ICD-10-CM

## 2014-06-15 DIAGNOSIS — I447 Left bundle-branch block, unspecified: Secondary | ICD-10-CM | POA: Diagnosis present

## 2014-06-15 DIAGNOSIS — D638 Anemia in other chronic diseases classified elsewhere: Secondary | ICD-10-CM | POA: Diagnosis present

## 2014-06-15 DIAGNOSIS — I129 Hypertensive chronic kidney disease with stage 1 through stage 4 chronic kidney disease, or unspecified chronic kidney disease: Secondary | ICD-10-CM | POA: Diagnosis present

## 2014-06-15 DIAGNOSIS — B974 Respiratory syncytial virus as the cause of diseases classified elsewhere: Secondary | ICD-10-CM | POA: Diagnosis present

## 2014-06-15 DIAGNOSIS — K219 Gastro-esophageal reflux disease without esophagitis: Secondary | ICD-10-CM | POA: Diagnosis present

## 2014-06-15 DIAGNOSIS — G9340 Encephalopathy, unspecified: Secondary | ICD-10-CM | POA: Diagnosis present

## 2014-06-15 DIAGNOSIS — Z87891 Personal history of nicotine dependence: Secondary | ICD-10-CM | POA: Diagnosis not present

## 2014-06-15 DIAGNOSIS — N179 Acute kidney failure, unspecified: Secondary | ICD-10-CM | POA: Diagnosis present

## 2014-06-15 DIAGNOSIS — Z66 Do not resuscitate: Secondary | ICD-10-CM | POA: Diagnosis present

## 2014-06-15 DIAGNOSIS — I429 Cardiomyopathy, unspecified: Secondary | ICD-10-CM | POA: Diagnosis present

## 2014-06-15 DIAGNOSIS — R0600 Dyspnea, unspecified: Secondary | ICD-10-CM

## 2014-06-15 DIAGNOSIS — N058 Unspecified nephritic syndrome with other morphologic changes: Secondary | ICD-10-CM

## 2014-06-15 DIAGNOSIS — Z8673 Personal history of transient ischemic attack (TIA), and cerebral infarction without residual deficits: Secondary | ICD-10-CM | POA: Diagnosis not present

## 2014-06-15 LAB — BLOOD GAS, ARTERIAL
Acid-base deficit: 8.4 mmol/L — ABNORMAL HIGH (ref 0.0–2.0)
Acid-base deficit: 6.3 mmol/L — ABNORMAL HIGH (ref 0.0–2.0)
BICARBONATE: 17.2 meq/L — AB (ref 20.0–24.0)
Bicarbonate: 18.3 mEq/L — ABNORMAL LOW (ref 20.0–24.0)
DELIVERY SYSTEMS: POSITIVE
DRAWN BY: 331471
Drawn by: 308601
Expiratory PAP: 5
FIO2: 0.3 %
Inspiratory PAP: 10
O2 CONTENT: 3 L/min
O2 Saturation: 89.3 %
O2 Saturation: 96.1 %
PATIENT TEMPERATURE: 98.6
PCO2 ART: 37.4 mmHg (ref 35.0–45.0)
PO2 ART: 87.1 mmHg (ref 80.0–100.0)
Patient temperature: 36.6
TCO2: 16.2 mmol/L (ref 0–100)
TCO2: 17.2 mmol/L (ref 0–100)
pCO2 arterial: 34.5 mmHg — ABNORMAL LOW (ref 35.0–45.0)
pH, Arterial: 7.283 — ABNORMAL LOW (ref 7.350–7.450)
pH, Arterial: 7.343 — ABNORMAL LOW (ref 7.350–7.450)
pO2, Arterial: 67.9 mmHg — ABNORMAL LOW (ref 80.0–100.0)

## 2014-06-15 LAB — COMPREHENSIVE METABOLIC PANEL
ALT: 15 U/L (ref 0–35)
AST: 31 U/L (ref 0–37)
Albumin: 3.5 g/dL (ref 3.5–5.2)
Alkaline Phosphatase: 140 U/L — ABNORMAL HIGH (ref 39–117)
Anion gap: 12 (ref 5–15)
BILIRUBIN TOTAL: 0.6 mg/dL (ref 0.3–1.2)
BUN: 31 mg/dL — ABNORMAL HIGH (ref 6–23)
CO2: 20 mmol/L (ref 19–32)
Calcium: 8.6 mg/dL (ref 8.4–10.5)
Chloride: 100 mEq/L (ref 96–112)
Creatinine, Ser: 1.52 mg/dL — ABNORMAL HIGH (ref 0.50–1.10)
GFR calc non Af Amer: 32 mL/min — ABNORMAL LOW (ref >90)
GFR, EST AFRICAN AMERICAN: 37 mL/min — AB (ref >90)
Glucose, Bld: 242 mg/dL — ABNORMAL HIGH (ref 70–99)
POTASSIUM: 4.3 mmol/L (ref 3.5–5.1)
SODIUM: 132 mmol/L — AB (ref 135–145)
TOTAL PROTEIN: 7.9 g/dL (ref 6.0–8.3)

## 2014-06-15 LAB — URINE MICROSCOPIC-ADD ON

## 2014-06-15 LAB — HEMOGLOBIN A1C
HEMOGLOBIN A1C: 8.9 % — AB (ref <5.7)
MEAN PLASMA GLUCOSE: 209 mg/dL — AB (ref <117)

## 2014-06-15 LAB — CBC WITH DIFFERENTIAL/PLATELET
Basophils Absolute: 0 10*3/uL (ref 0.0–0.1)
Basophils Relative: 0 % (ref 0–1)
Eosinophils Absolute: 0 10*3/uL (ref 0.0–0.7)
Eosinophils Relative: 0 % (ref 0–5)
HEMATOCRIT: 33.4 % — AB (ref 36.0–46.0)
Hemoglobin: 10.6 g/dL — ABNORMAL LOW (ref 12.0–15.0)
LYMPHS ABS: 0.9 10*3/uL (ref 0.7–4.0)
Lymphocytes Relative: 9 % — ABNORMAL LOW (ref 12–46)
MCH: 26 pg (ref 26.0–34.0)
MCHC: 31.7 g/dL (ref 30.0–36.0)
MCV: 82.1 fL (ref 78.0–100.0)
Monocytes Absolute: 0.7 10*3/uL (ref 0.1–1.0)
Monocytes Relative: 8 % (ref 3–12)
NEUTROS PCT: 83 % — AB (ref 43–77)
Neutro Abs: 7.6 10*3/uL (ref 1.7–7.7)
Platelets: 221 10*3/uL (ref 150–400)
RBC: 4.07 MIL/uL (ref 3.87–5.11)
RDW: 18.7 % — ABNORMAL HIGH (ref 11.5–15.5)
WBC: 9.2 10*3/uL (ref 4.0–10.5)

## 2014-06-15 LAB — TROPONIN I
TROPONIN I: 0.06 ng/mL — AB (ref <0.031)
TROPONIN I: 0.09 ng/mL — AB (ref <0.031)
Troponin I: 0.05 ng/mL — ABNORMAL HIGH (ref <0.031)

## 2014-06-15 LAB — URINALYSIS, ROUTINE W REFLEX MICROSCOPIC
BILIRUBIN URINE: NEGATIVE
GLUCOSE, UA: 100 mg/dL — AB
Hgb urine dipstick: NEGATIVE
KETONES UR: NEGATIVE mg/dL
Nitrite: NEGATIVE
Protein, ur: 100 mg/dL — AB
SPECIFIC GRAVITY, URINE: 1.018 (ref 1.005–1.030)
Urobilinogen, UA: 0.2 mg/dL (ref 0.0–1.0)
pH: 5 (ref 5.0–8.0)

## 2014-06-15 LAB — GLUCOSE, CAPILLARY
GLUCOSE-CAPILLARY: 169 mg/dL — AB (ref 70–99)
GLUCOSE-CAPILLARY: 194 mg/dL — AB (ref 70–99)
GLUCOSE-CAPILLARY: 307 mg/dL — AB (ref 70–99)
GLUCOSE-CAPILLARY: 204 mg/dL — AB (ref 70–99)
Glucose-Capillary: 225 mg/dL — ABNORMAL HIGH (ref 70–99)
Glucose-Capillary: 201 mg/dL — ABNORMAL HIGH (ref 70–99)

## 2014-06-15 LAB — CBC
HEMATOCRIT: 32.7 % — AB (ref 36.0–46.0)
HEMOGLOBIN: 10.1 g/dL — AB (ref 12.0–15.0)
MCH: 25.5 pg — ABNORMAL LOW (ref 26.0–34.0)
MCHC: 30.9 g/dL (ref 30.0–36.0)
MCV: 82.6 fL (ref 78.0–100.0)
Platelets: 176 10*3/uL (ref 150–400)
RBC: 3.96 MIL/uL (ref 3.87–5.11)
RDW: 18.8 % — ABNORMAL HIGH (ref 11.5–15.5)
WBC: 4.8 10*3/uL (ref 4.0–10.5)

## 2014-06-15 LAB — BASIC METABOLIC PANEL
ANION GAP: 13 (ref 5–15)
BUN: 43 mg/dL — ABNORMAL HIGH (ref 6–23)
CO2: 18 mmol/L — ABNORMAL LOW (ref 19–32)
Calcium: 8.6 mg/dL (ref 8.4–10.5)
Chloride: 101 mEq/L (ref 96–112)
Creatinine, Ser: 1.78 mg/dL — ABNORMAL HIGH (ref 0.50–1.10)
GFR, EST AFRICAN AMERICAN: 30 mL/min — AB (ref >90)
GFR, EST NON AFRICAN AMERICAN: 26 mL/min — AB (ref >90)
GLUCOSE: 324 mg/dL — AB (ref 70–99)
POTASSIUM: 5.1 mmol/L (ref 3.5–5.1)
Sodium: 132 mmol/L — ABNORMAL LOW (ref 135–145)

## 2014-06-15 LAB — TSH: TSH: 2 u[IU]/mL (ref 0.350–4.500)

## 2014-06-15 LAB — MAGNESIUM: Magnesium: 1.7 mg/dL (ref 1.5–2.5)

## 2014-06-15 LAB — PROCALCITONIN: PROCALCITONIN: 0.26 ng/mL

## 2014-06-15 LAB — MRSA PCR SCREENING: MRSA BY PCR: NEGATIVE

## 2014-06-15 LAB — PHOSPHORUS: PHOSPHORUS: 5.1 mg/dL — AB (ref 2.3–4.6)

## 2014-06-15 LAB — LACTIC ACID, PLASMA: Lactic Acid, Venous: 2.7 mmol/L (ref 0.5–2.0)

## 2014-06-15 MED ORDER — LORAZEPAM 2 MG/ML IJ SOLN
INTRAMUSCULAR | Status: AC
  Filled 2014-06-15: qty 1

## 2014-06-15 MED ORDER — LORATADINE 10 MG PO TABS
10.0000 mg | ORAL_TABLET | Freq: Every day | ORAL | Status: DC
  Administered 2014-06-16 – 2014-06-17 (×2): 10 mg via ORAL
  Filled 2014-06-15 (×3): qty 1

## 2014-06-15 MED ORDER — METHYLPREDNISOLONE SODIUM SUCC 125 MG IJ SOLR
60.0000 mg | Freq: Three times a day (TID) | INTRAMUSCULAR | Status: DC
  Administered 2014-06-15 – 2014-06-16 (×2): 60 mg via INTRAVENOUS
  Filled 2014-06-15 (×2): qty 2

## 2014-06-15 MED ORDER — HYDRALAZINE HCL 20 MG/ML IJ SOLN: 10.0000 mg | Freq: Four times a day (QID) | INTRAMUSCULAR | Status: DC | PRN

## 2014-06-15 MED ORDER — PROCHLORPERAZINE EDISYLATE 5 MG/ML IJ SOLN
10.0000 mg | Freq: Four times a day (QID) | INTRAMUSCULAR | Status: DC | PRN
  Administered 2014-06-15: 10 mg via INTRAVENOUS
  Filled 2014-06-15: qty 2

## 2014-06-15 MED ORDER — LEVALBUTEROL HCL 1.25 MG/0.5ML IN NEBU: 10.0000 mg | INHALATION_SOLUTION | Freq: Once | RESPIRATORY_TRACT | Status: DC

## 2014-06-15 MED ORDER — NITROGLYCERIN IN D5W 200-5 MCG/ML-% IV SOLN
INTRAVENOUS | Status: AC
  Filled 2014-06-15: qty 250

## 2014-06-15 MED ORDER — PANTOPRAZOLE SODIUM 40 MG PO TBEC
40.0000 mg | DELAYED_RELEASE_TABLET | Freq: Every day | ORAL | Status: DC
  Administered 2014-06-15 – 2014-06-17 (×3): 40 mg via ORAL
  Filled 2014-06-15 (×3): qty 1

## 2014-06-15 MED ORDER — ALBUTEROL (5 MG/ML) CONTINUOUS INHALATION SOLN
5.0000 mg/h | INHALATION_SOLUTION | RESPIRATORY_TRACT | Status: DC
  Filled 2014-06-15: qty 20

## 2014-06-15 MED ORDER — BUDESONIDE 0.25 MG/2ML IN SUSP
0.2500 mg | Freq: Four times a day (QID) | RESPIRATORY_TRACT | Status: DC
  Administered 2014-06-15 – 2014-06-16 (×3): 0.25 mg via RESPIRATORY_TRACT
  Filled 2014-06-15 (×3): qty 2

## 2014-06-15 MED ORDER — FUROSEMIDE 10 MG/ML IJ SOLN
INTRAMUSCULAR | Status: AC
  Filled 2014-06-15: qty 4

## 2014-06-15 MED ORDER — LEVALBUTEROL HCL 1.25 MG/0.5ML IN NEBU
1.2500 mg | INHALATION_SOLUTION | Freq: Four times a day (QID) | RESPIRATORY_TRACT | Status: DC
  Administered 2014-06-15 – 2014-06-22 (×30): 1.25 mg via RESPIRATORY_TRACT
  Filled 2014-06-15 (×36): qty 0.5

## 2014-06-15 MED ORDER — BUDESONIDE 0.25 MG/2ML IN SUSP
0.2500 mg | Freq: Two times a day (BID) | RESPIRATORY_TRACT | Status: DC
  Administered 2014-06-15: 0.25 mg via RESPIRATORY_TRACT
  Filled 2014-06-15: qty 2

## 2014-06-15 MED ORDER — FUROSEMIDE 10 MG/ML IJ SOLN
40.0000 mg | Freq: Once | INTRAMUSCULAR | Status: AC
  Administered 2014-06-15: 40 mg via INTRAVENOUS

## 2014-06-15 MED ORDER — INSULIN ASPART 100 UNIT/ML ~~LOC~~ SOLN
0.0000 [IU] | SUBCUTANEOUS | Status: DC
Start: 2014-06-15 — End: 2014-06-17
  Administered 2014-06-15: 11 [IU] via SUBCUTANEOUS
  Administered 2014-06-16: 2 [IU] via SUBCUTANEOUS
  Administered 2014-06-16: 3 [IU] via SUBCUTANEOUS
  Administered 2014-06-16: 5 [IU] via SUBCUTANEOUS
  Administered 2014-06-16: 2 [IU] via SUBCUTANEOUS
  Administered 2014-06-16: 3 [IU] via SUBCUTANEOUS
  Administered 2014-06-16: 5 [IU] via SUBCUTANEOUS
  Administered 2014-06-17 (×2): 2 [IU] via SUBCUTANEOUS

## 2014-06-15 MED ORDER — NITROGLYCERIN IN D5W 200-5 MCG/ML-% IV SOLN
0.0000 ug/min | INTRAVENOUS | Status: DC
  Administered 2014-06-15: 10 ug/min via INTRAVENOUS

## 2014-06-15 MED ORDER — LEVALBUTEROL HCL 0.63 MG/3ML IN NEBU
10.0000 mg | INHALATION_SOLUTION | Freq: Once | RESPIRATORY_TRACT | Status: DC
  Administered 2014-06-15: 10 mg via RESPIRATORY_TRACT

## 2014-06-15 MED ORDER — LEVOFLOXACIN IN D5W 500 MG/100ML IV SOLN
500.0000 mg | Freq: Once | INTRAVENOUS | Status: AC
  Administered 2014-06-15: 500 mg via INTRAVENOUS
  Filled 2014-06-15: qty 100

## 2014-06-15 MED ORDER — FLUTICASONE PROPIONATE 50 MCG/ACT NA SUSP
2.0000 | Freq: Every day | NASAL | Status: DC
  Administered 2014-06-16 – 2014-06-22 (×6): 2 via NASAL
  Filled 2014-06-15 (×2): qty 16

## 2014-06-15 MED ORDER — LEVOFLOXACIN IN D5W 250 MG/50ML IV SOLN: 250.0000 mg | INTRAVENOUS | Status: DC

## 2014-06-15 MED ORDER — LEVALBUTEROL HCL 0.63 MG/3ML IN NEBU
0.6300 mg | INHALATION_SOLUTION | Freq: Once | RESPIRATORY_TRACT | Status: AC
  Administered 2014-06-15: 0.63 mg via RESPIRATORY_TRACT

## 2014-06-15 MED ORDER — FUROSEMIDE 10 MG/ML IJ SOLN: 20.0000 mg | Freq: Once | INTRAMUSCULAR | Status: DC

## 2014-06-15 MED ORDER — LORAZEPAM 2 MG/ML IJ SOLN
0.5000 mg | Freq: Once | INTRAMUSCULAR | Status: AC
  Administered 2014-06-15: 0.5 mg via INTRAVENOUS

## 2014-06-15 MED ORDER — INSULIN ASPART 100 UNIT/ML ~~LOC~~ SOLN
0.0000 [IU] | Freq: Three times a day (TID) | SUBCUTANEOUS | Status: DC
  Administered 2014-06-15: 3 [IU] via SUBCUTANEOUS

## 2014-06-15 MED ORDER — ENSURE COMPLETE PO LIQD
237.0000 mL | Freq: Two times a day (BID) | ORAL | Status: DC
  Administered 2014-06-15 – 2014-06-17 (×5): 237 mL via ORAL

## 2014-06-15 MED ORDER — HYDRALAZINE HCL 20 MG/ML IJ SOLN
10.0000 mg | INTRAMUSCULAR | Status: DC | PRN
  Administered 2014-06-15: 20 mg via INTRAVENOUS
  Administered 2014-06-18: 10 mg via INTRAVENOUS
  Administered 2014-06-18 (×2): 20 mg via INTRAVENOUS
  Administered 2014-06-19: 40 mg via INTRAVENOUS
  Administered 2014-06-19 (×2): 20 mg via INTRAVENOUS
  Administered 2014-06-20 – 2014-06-21 (×3): 40 mg via INTRAVENOUS
  Filled 2014-06-15 (×4): qty 1
  Filled 2014-06-15 (×3): qty 2
  Filled 2014-06-15 (×2): qty 1
  Filled 2014-06-15 (×3): qty 2

## 2014-06-15 NOTE — Progress Notes (Signed)
Received called this afternoon regarding patient experiencing difficulty breathing initially and been congested. Despite breathing treatment and starting 2L [place] patient continue to have difficulty breathing and become really tachypneic. Rapid response was called and despite second breathing treatment she continue struggling to breath. Patient has been moved to step down for close monitoring.  On exam patient with diffuse wheezing, ABG with 7.28/37.4/67.9 on RA. Patient with very fine crackles on exam as well and poor air movement.  Plan: -will start BIPAP -PCCM will be contacted for back up and support in case breathing compromises further -will adjust solumedrol dose, 1 dose of lasix given and repeat CXR ordered -patient will be NPO for now -follow ABG in 2 hours after starting BIPAP   Vassie Loll [phone]

## 2014-06-15 NOTE — Progress Notes (Signed)
Called for pt in resp distress.  Pt is diaphoretic, tachypneic, and hypertensive  with audible wheezing.  Receiving neb treatment.  Placed on monitor with bundle branch block. Sats 98% on 2L Dr Gwenlyn Perking called. Pt urgently transferred to SD rm 1233.

## 2014-06-15 NOTE — Progress Notes (Signed)
UR completed 

## 2014-06-15 NOTE — Progress Notes (Signed)
Rt called to room 1503 for pt in respiratory distress. When Rt got to room pt is having increased WOB and WH. Rt gave to nebs of Xopenex 0.63mg . Rapid response was called and MD. Pt being transferred to ICU room 1233.

## 2014-06-15 NOTE — Consult Note (Signed)
PULMONARY / CRITICAL CARE MEDICINE   Name: Glenda Gonzalez MRN: [id number] DOB: 18-Sep-1933    ADMISSION DATE:  06/14/2014 CONSULTATION DATE:  06/15/2014  REFERRING MD :  Dr. Gwenlyn Perking  CHIEF COMPLAINT:  SOB  INITIAL PRESENTATION: 81 year old female admitted to [facility] 1/20 for presumed COPD exacerbation. Initially improved and transferred out of SDU. 1/21 became acutely dyspneic, hypertensive, and tachycardic. BiPAP started. PCCM consulted.  STUDIES:  1/21 Echo > LVEF 15-20%, Mild MR  SIGNIFICANT EVENTS: 1/20 admit to ICU 1/21 transfer to floor, decompensated, back to ICU, BiPAP.    HISTORY OF PRESENT ILLNESS:  81 year old female with PMH as below, which includes CHF (LVEF 15-20%), HTN, CVA, DM, CKD, and Seizures. She presented to [facility] ED 1/20 c/o cold like symptoms for the past 2 months. Increased cough productive of white sputum. She started to have gradually worse wheezing and increase of work of breathing. In ED she was provided with nebs and steroids with some improvement. She had a mildly elevated troponin, and LBBB on EKG which is not new. She was admitted to ICU/SDU. 1/21 AM she improved and was transferred to floor. 1/21 PM she became acutely dyspneic, hypertensive, and tachycardic. Transferred back to ICU on BiPAP.   PAST MEDICAL HISTORY :   has a past medical history of GERD (gastroesophageal reflux disease); Hypertension; Diverticulitis; Gout; Essential and other specified forms of tremor; Dementia; CAD (coronary artery disease); Anemia, chronic disease; Left arm pain; CHF (congestive heart failure); Pneumonia; Acute renal failure (ARF); Seizures; Arthritis; Diabetes mellitus; Stroke (2014); and CAD (04/25/2009).  has past surgical history that includes Dilation and curettage of uterus; Tumor removal; Abdominal hysterectomy; Cataract extraction w/PHACO (Left, 03/30/2013); Appendectomy; Colonoscopy; Fracture surgery (Left); and Pars plana vitrectomy (Left, 12/28/2013). Prior to Admission  medications   Medication Sig Start Date End Date Taking? Authorizing Provider  albuterol (PROVENTIL HFA;VENTOLIN HFA) 108 (90 BASE) MCG/ACT inhaler Inhale 2 puffs into the lungs every 6 (six) hours as needed for wheezing or shortness of breath. 05/29/13  Yes Jessica U Vann, DO  bimatoprost (LUMIGAN) 0.01 % SOLN Place 1 drop into both eyes 2 (two) times daily.    Yes Historical Provider, MD  dextromethorphan-guaiFENesin (MUCINEX DM) 30-600 MG per 12 hr tablet Take 1 tablet by mouth 2 (two) times daily.   Yes Historical Provider, MD  diphenhydrAMINE (BENADRYL) 25 mg capsule Take 25 mg by mouth at bedtime as needed for itching or sleep.    Yes Historical Provider, MD  donepezil (ARICEPT) 10 MG tablet Take 10 mg by mouth at bedtime.   Yes Historical Provider, MD  esomeprazole (NEXIUM) 40 MG capsule Take 40 mg by mouth daily.    Yes Historical Provider, MD  hydrALAZINE (APRESOLINE) 25 MG tablet Take 1 tablet (25 mg total) by mouth every 8 (eight) hours. 10/19/13  Yes Jerald Kief, MD  insulin NPH Human (HUMULIN N,NOVOLIN N) 100 UNIT/ML injection Inject into the skin as directed.   Yes Historical Provider, MD  Insulin Pen Needle 29G X 12.7MM MISC 1 Device by Does not apply route 4 (four) times daily. 10/19/13  Yes Jerald Kief, MD  ketorolac (ACULAR) 0.4 % SOLN Place 1 drop into both eyes 4 (four) times daily.   Yes Historical Provider, MD  metoprolol tartrate (LOPRESSOR) 25 MG tablet Take 1 tablet (25 mg total) by mouth 2 (two) times daily. 10/19/13  Yes Jerald Kief, MD  nitroGLYCERIN (NITROSTAT) 0.4 MG SL tablet Place 0.4 mg under the tongue every  5 (five) minutes as needed for chest pain.   Yes Historical Provider, MD  Polyethyl Glycol-Propyl Glycol (SYSTANE ULTRA OP) Place 1 drop into both eyes daily as needed. For dry eyes   Yes Historical Provider, MD  prednisoLONE acetate (PRED FORTE) 1 % ophthalmic suspension Place 1 drop into both eyes 4 (four) times daily.   Yes Historical Provider, MD   pregabalin (LYRICA) 50 MG capsule Take 50 mg by mouth 3 (three) times daily.   Yes Historical Provider, MD  ciprofloxacin (CIPRO) 250 MG tablet Take 1 tablet (250 mg total) by mouth 2 (two) times daily. Patient not taking: Reported on 06/14/2014 02/04/14   Dorothea Ogle, MD  insulin aspart (NOVOLOG FLEXPEN) 100 UNIT/ML FlexPen Inject 2 Units into the skin 3 (three) times daily with meals. Patient not taking: Reported on 06/14/2014 10/19/13   Jerald Kief, MD  Insulin Glargine (LANTUS) 100 UNIT/ML Solostar Pen Inject 18 Units into the skin daily at 10 pm. Patient not taking: Reported on 06/14/2014 10/19/13   Jerald Kief, MD  isosorbide mononitrate (IMDUR) 60 MG 24 hr tablet Take 1 tablet (60 mg total) by mouth daily. Patient not taking: Reported on 06/14/2014 10/19/13   Jerald Kief, MD   Allergies  Allergen Reactions  . Ace Inhibitors Shortness Of Breath and Other (See Comments)    Angioedema   . Codeine Other (See Comments)    hallucination  . Fentanyl Other (See Comments)    Abnormal behavior per husband  . Other Other (See Comments)    Beef and turkey-causes gout flareup  . Morphine Itching  . Penicillins Itching    FAMILY HISTORY:  has no family status information on file.  SOCIAL HISTORY:  reports that she quit smoking about 34 years ago. Her smoking use included Cigarettes. She has a 5 pack-year smoking history. She has never used smokeless tobacco. She reports that she does not drink alcohol or use illicit drugs.  REVIEW OF SYSTEMS:   Bolds are positive  Constitutional: weight loss, gain, night sweats, Fevers, chills, fatigue .  HEENT: headaches, Sore throat, sneezing, nasal congestion, post nasal drip, Difficulty swallowing, Tooth/dental problems, visual complaints visual changes, ear ache CV:  chest pain, radiates: ,Orthopnea, PND, swelling in lower extremities, dizziness, palpitations, syncope.  GI  heartburn, indigestion, abdominal pain, nausea, vomiting, diarrhea,  change in bowel habits, loss of appetite, bloody stools.  Resp: cough, productive: , hemoptysis, dyspnea, chest pain, pleuritic.  Skin: rash or itching or icterus GU: dysuria, change in color of urine, urgency or frequency. flank pain, hematuria  MS: joint pain or swelling. decreased range of motion  Psych: change in mood or affect. depression or anxiety.  Neuro: difficulty with speech, weakness, numbness, ataxia    SUBJECTIVE:   VITAL SIGNS: Temp:  [97.4 F (36.3 C)-98.1 F (36.7 C)] 97.9 F (36.6 C) (01/21 1500) Pulse Rate:  [69-107] 104 (01/21 1801) Resp:  [14-34] 34 (01/21 1801) BP: (122-186)/(48-99) 186/99 mmHg (01/21 1801) SpO2:  [93 %-100 %] 99 % (01/21 1801) Weight:  [84.4 kg (186 lb 1.1 oz)] 84.4 kg (186 lb 1.1 oz) (01/20 2321) HEMODYNAMICS:   VENTILATOR SETTINGS:   INTAKE / OUTPUT:  Intake/Output Summary (Last 24 hours) at 06/15/14 1855 Last data filed at 06/15/14 0548  Gross per 24 hour  Intake      0 ml  Output    300 ml  Net   -300 ml    PHYSICAL EXAMINATION: General:  Elderly, chronically ill appearing female in  mild distress on BiPAP Neuro:  Alert, oriented, non-focal HEENT:  [place]/AT, PERRL, no JVD noted Cardiovascular:  Tachy, regular. No MRG Lungs:  Coarse wheeze throughout Abdomen:  Soft, non-tender, non-distended Musculoskeletal:  No acute deformity. +2 ankle edema Skin:  Sacral decub  LABS:  CBC  Recent Labs Lab 06/14/14 1701 06/15/14 0615  WBC 7.3 4.8  HGB 10.9* 10.1*  HCT 34.4* 32.7*  PLT 176 176   Coag's No results for input(s): APTT, INR in the last 168 hours. BMET  Recent Labs Lab 06/14/14 1701 06/15/14 0615  NA 136 132*  K 3.9 4.3  CL 105 100  CO2 19 20  BUN 25* 31*  CREATININE 1.35* 1.52*  GLUCOSE 36* 242*   Electrolytes  Recent Labs Lab 06/14/14 1701 06/15/14 0615  CALCIUM 8.5 8.6  MG  --  1.7  PHOS  --  5.1*   Sepsis Markers No results for input(s): LATICACIDVEN, PROCALCITON, O2SATVEN in the last 168  hours. ABG  Recent Labs Lab 06/14/14 2138 06/15/14 1745  PHART 7.384 7.283*  PCO2ART 33.0* 37.4  PO2ART 65.7* 67.9*   Liver Enzymes  Recent Labs Lab 06/14/14 1701 06/15/14 0615  AST 24 31  ALT 13 15  ALKPHOS 151* 140*  BILITOT 0.6 0.6  ALBUMIN 3.6 3.5   Cardiac Enzymes  Recent Labs Lab 06/14/14 1701 06/15/14 0615 06/15/14 1317  TROPONINI 0.10* 0.06* 0.05*   Glucose  Recent Labs Lab 06/14/14 1841 06/14/14 1927 06/14/14 2307 06/15/14 0513 06/15/14 0809 06/15/14 1148  GLUCAP 46* 156* 169* 225* 201* 194*    Imaging Dg Chest 2 View  06/14/2014   CLINICAL DATA:  Productive cough for 2 months.  EXAM: CHEST  2 VIEW  COMPARISON:  None.  FINDINGS: The heart is mildly enlarged. There is tortuosity and calcification of the thoracic aorta. There are significant bronchitic changes with peribronchial thickening, increased interstitial markings and streaky areas of atelectasis. Findings could be due to severe bronchitis or atypical interstitial pneumonitis but no focal airspace consolidation or pleural effusion. The bony thorax is intact.  IMPRESSION: Significant bronchitic changes without definite infiltrate.   Electronically Signed   By: Loralie Champagne M.D.   On: 06/14/2014 15:39     ASSESSMENT / PLAN:  PULMONARY A: Acute hypoxemic respiratory failure. ? Ischemia/Edema induced ?COPD, but no history of this Consider cardiac asthma Pulmonary Edema 2nd to A on C systolic CHF  P:   Continuous BiPAP Continue systemic steroids Continue scheduled bronchodilators/budesonide PRN levalbuterol Diuresis as renal function tolerates F/u CXR in AM Repeat ABG May require intubation tonight  CARDIOVASCULAR A:  Acute on chronic HFrEF (LVEF 15-20%, which is a precipitous drop from prior studies) Hypertensive urgency H/o CAD Troponin elevation  P:  Telemetry monitoring Lasix 40mg  IV now Initiate nitroglycerine gtt PRN hydralazine SBP goal < 160 mm/Hg Serial  troponins Cardiology consult in AM  RENAL A:   Metabolic acidosis of uncertain etiology CKD   P:   Follow Bmet and UOP Replace electrolytes as indicated Check lactic acid  GASTROINTESTINAL A:  GERD  P:   NPO while on NIMV SUP: IV omeprazole Hold PO meds tonight, hope can resume in AM  HEMATOLOGIC A:   Microcytic anemia, chronic  P:  Follow CBC Enoxaparin for VTE ppx  INFECTIOUS A:   ? COPD exacerbation  P:   Doxy 1/20 > 1/21 Abx: Levaquin, start date 1/21 >> Check PCT to hopefully limit ABX exposure  ENDOCRINE A:   DM  P:   Hold home  lantus CBG monitoring and SSI  NEUROLOGIC A:   No acute issues H/o CVA  P:   RASS goal: 0 Monitor   FAMILY  - Updates:   - Inter-disciplinary family meet or Palliative Care meeting due by:  1/28   Joneen Roach, AGACNP-BC [place] [facility] Pager [phone] or [phone]

## 2014-06-15 NOTE — Progress Notes (Signed)
CARE MANAGEMENT NOTE 06/15/2014  Patient:  Glenda Gonzalez, Glenda Gonzalez   Account Number:  [id number]  Date Initiated:  06/15/2014  Documentation initiated by:  Ferdinand Cava  Subjective/Objective Assessment:   81 yo female admitted with COPD exacerbation from home     Action/Plan:   discharge planning   Anticipated DC Date:  06/18/2014   Anticipated DC Plan:        DC Planning Services  CM consult      Choice offered to / List presented to:             Status of service:   Medicare Important Message given?   (If response is "NO", the following Medicare IM given date fields will be blank) Date Medicare IM given:   Medicare IM given by:   Date Additional Medicare IM given:   Additional Medicare IM given by:    Discharge Disposition:    Per UR Regulation:    If discussed at Long Length of Stay Meetings, dates discussed:    Comments:  06/15/14 Ferdinand Cava RN BSn CM [phone] Patient stated that she lives at home with her husband and 2 sons. She has a walker, cane, and BSC. She stated she has a PCP and pharmacy and her spouse drives her to appointments. Will continue to follow for dc needs

## 2014-06-15 NOTE — Progress Notes (Signed)
Pt called this rn at about 1700 and stated, " I can't breathe". Pt struggling to breathe and diaphoretic. O2 90 on RA. 2 liters of O2 applied. sats up to 98% on 2l. Lungs sound congested with decreased exp wheezes on auscultation. pyt RT called for prn breathing treatment. MD also notified. RRRN notified.  Breathing treatment given x 2. Pt little to no relief. MD notified the 2nd time. Order given to transfer pt to stepdown. Pt transferred to stepdown. Report given to [place], rn. Vwilliams,rn.

## 2014-06-15 NOTE — Progress Notes (Signed)
  Echocardiogram 2D Echocardiogram has been performed.  Glenda Gonzalez 06/15/2014, 12:26 PM

## 2014-06-15 NOTE — Progress Notes (Signed)
[facility] PROGRESS NOTE  Glenda Gonzalez:[id number] DOB: Sep 21, 1933 DOA: 06/14/2014 PCP: Billee Cashing, MD  Assessment/Plan: 1-SOB and productive cough: due to COPD exacerbation. -slowly improving -will continue antibiotics and steroids -PRN oxygen supplementation -will start pulmicort and flutter valve -continue mucinex  2-DM type with nephropathy: presenting with hypoglycemia. -now stable and with CBG's rising -follow Hg A1C -continue lantus a reduced dose and adjust as needed -continue also SSI (moderate)  3-CKD: stage 3 -stable and at baseline -will monitor renal function  4-essential HTN: stable. Will continue home antihypertensive agents  5-elevated troponin: trending down, patient W?o CP and no ischemic changes on telemetry or EKG. -mild troponin elevation most likely associated with renal function -will follow 2-D echo  6-GERD: continue PPI  Code Status: Full Family Communication: no family at bedside Disposition Plan: will transfer out of step down; continue nebulizer, abx's and steroids. PT to see.   Consultants:  None   Procedures:  See below for -x-ray report   Antibiotics:  Doxycycline   HPI/Subjective: Low grade temp overnight. Patient denies CP, no nausea or vomiting. Endorses breathing is improving.  Objective: Filed Vitals:   06/15/14 0800  BP: 141/80  Pulse: 81  Temp:   Resp: 16    Intake/Output Summary (Last 24 hours) at 06/15/14 0844 Last data filed at 06/15/14 0548  Gross per 24 hour  Intake      0 ml  Output    300 ml  Net   -300 ml   Filed Weights   06/14/14 2321  Weight: 84.4 kg (186 lb 1.1 oz)    Exam:   General:  Feeling better; still with coughing spells, diffuse wheezing and rhonchi and with difficuklties to speak in full sentences. Low grade temp overnight appreciated.  Cardiovascular: S1 and S2, no rubs or gallops  Respiratory: diffuse exp wheezing; diffuse rhonchi, fair air  movement  Abdomen: soft, ND, positive BS, NT on palpation   Musculoskeletal: no cyanosis; trace edema bilaterally  Data Reviewed: Basic Metabolic Panel:  Recent Labs Lab 06/14/14 1701 06/15/14 0615  NA 136 132*  K 3.9 4.3  CL 105 100  CO2 19 20  GLUCOSE 36* 242*  BUN 25* 31*  CREATININE 1.35* 1.52*  CALCIUM 8.5 8.6  MG  --  1.7  PHOS  --  5.1*   Liver Function Tests:  Recent Labs Lab 06/14/14 1701 06/15/14 0615  AST 24 31  ALT 13 15  ALKPHOS 151* 140*  BILITOT 0.6 0.6  PROT 8.0 7.9  ALBUMIN 3.6 3.5   CBC:  Recent Labs Lab 06/14/14 1701 06/15/14 0615  WBC 7.3 4.8  NEUTROABS 5.1  --   HGB 10.9* 10.1*  HCT 34.4* 32.7*  MCV 81.5 82.6  PLT 176 176   Cardiac Enzymes:  Recent Labs Lab 06/14/14 1701 06/15/14 0615  TROPONINI 0.10* 0.06*   BNP (last 3 results)  Recent Labs  10/14/13 1653  PROBNP 409.0   CBG:  Recent Labs Lab 06/14/14 1824 06/14/14 1841 06/14/14 1927 06/14/14 2307  GLUCAP 34* 46* 156* 169*    Recent Results (from the past 240 hour(s))  MRSA PCR Screening     Status: None   Collection Time: 06/14/14 11:27 PM  Result Value Ref Range Status   MRSA by PCR NEGATIVE NEGATIVE Final    Comment:        The GeneXpert MRSA Assay (FDA approved for NASAL specimens only), is one component of a comprehensive MRSA colonization surveillance program. It is not intended  to diagnose MRSA infection nor to guide or monitor treatment for MRSA infections.      Studies: Dg Chest 2 View  06/14/2014   CLINICAL DATA:  Productive cough for 2 months.  EXAM: CHEST  2 VIEW  COMPARISON:  None.  FINDINGS: The heart is mildly enlarged. There is tortuosity and calcification of the thoracic aorta. There are significant bronchitic changes with peribronchial thickening, increased interstitial markings and streaky areas of atelectasis. Findings could be due to severe bronchitis or atypical interstitial pneumonitis but no focal airspace consolidation or  pleural effusion. The bony thorax is intact.  IMPRESSION: Significant bronchitic changes without definite infiltrate.   Electronically Signed   By: Loralie Champagne M.D.   On: 06/14/2014 15:39    Scheduled Meds: . aspirin  325 mg Oral Once  . bisoprolol  10 mg Oral Daily  . budesonide (PULMICORT) nebulizer solution  0.25 mg Nebulization BID  . docusate sodium  100 mg Oral BID  . donepezil  10 mg Oral QHS  . doxycycline  100 mg Oral Q12H  . enoxaparin (LOVENOX) injection  40 mg Subcutaneous QHS  . feeding supplement (ENSURE COMPLETE)  237 mL Oral BID BM  . guaiFENesin  600 mg Oral BID  . hydrALAZINE  25 mg Oral 3 times per day  . insulin aspart  0-15 Units Subcutaneous TID WC  . insulin glargine  10 Units Subcutaneous QHS  . ipratropium  0.5 mg Nebulization Q6H  . isosorbide mononitrate  60 mg Oral Daily  . ketorolac  1 drop Both Eyes QID  . latanoprost  1 drop Both Eyes QHS  . levalbuterol  1.25 mg Nebulization 4 times per day  . methylPREDNISolone (SOLU-MEDROL) injection  60 mg Intravenous Q12H  . prednisoLONE acetate  1 drop Both Eyes QID  . pregabalin  50 mg Oral TID  . sodium chloride  3 mL Intravenous Q12H  . sodium chloride  3 mL Intravenous Q12H   Continuous Infusions:   Active Problems:   Essential hypertension   CHF (congestive heart failure)   CKD (chronic kidney disease), stage III   Diastolic congestive heart failure   COPD exacerbation   Hypoglycemia   DM (diabetes mellitus) type II controlled with renal manifestation    Time spent: 40 minutes   Vassie Loll  [facility] Pager [phone]. If 7PM-7AM, please contact night-coverage at [email or web address], password [facility] 06/15/2014, 8:44 AM  LOS: 1 day

## 2014-06-16 ENCOUNTER — Encounter (HOSPITAL_COMMUNITY): Payer: Self-pay | Admitting: Pulmonary Disease

## 2014-06-16 DIAGNOSIS — N179 Acute kidney failure, unspecified: Secondary | ICD-10-CM

## 2014-06-16 DIAGNOSIS — I10 Essential (primary) hypertension: Secondary | ICD-10-CM

## 2014-06-16 DIAGNOSIS — N183 Chronic kidney disease, stage 3 (moderate): Secondary | ICD-10-CM

## 2014-06-16 DIAGNOSIS — I5021 Acute systolic (congestive) heart failure: Secondary | ICD-10-CM

## 2014-06-16 DIAGNOSIS — J9601 Acute respiratory failure with hypoxia: Principal | ICD-10-CM

## 2014-06-16 LAB — GLUCOSE, CAPILLARY
GLUCOSE-CAPILLARY: 238 mg/dL — AB (ref 70–99)
Glucose-Capillary: 125 mg/dL — ABNORMAL HIGH (ref 70–99)
Glucose-Capillary: 143 mg/dL — ABNORMAL HIGH (ref 70–99)
Glucose-Capillary: 171 mg/dL — ABNORMAL HIGH (ref 70–99)
Glucose-Capillary: 188 mg/dL — ABNORMAL HIGH (ref 70–99)
Glucose-Capillary: 197 mg/dL — ABNORMAL HIGH (ref 70–99)

## 2014-06-16 LAB — URINE CULTURE: Special Requests: NORMAL

## 2014-06-16 LAB — BASIC METABOLIC PANEL
Anion gap: 8 (ref 5–15)
BUN: 50 mg/dL — ABNORMAL HIGH (ref 6–23)
CALCIUM: 8.8 mg/dL (ref 8.4–10.5)
CO2: 21 mmol/L (ref 19–32)
Chloride: 107 mEq/L (ref 96–112)
Creatinine, Ser: 1.88 mg/dL — ABNORMAL HIGH (ref 0.50–1.10)
GFR calc Af Amer: 28 mL/min — ABNORMAL LOW (ref >90)
GFR calc non Af Amer: 24 mL/min — ABNORMAL LOW (ref >90)
Glucose, Bld: 150 mg/dL — ABNORMAL HIGH (ref 70–99)
POTASSIUM: 5.6 mmol/L — AB (ref 3.5–5.1)
Sodium: 136 mmol/L (ref 135–145)

## 2014-06-16 LAB — CBC
HCT: 34.1 % — ABNORMAL LOW (ref 36.0–46.0)
Hemoglobin: 10.6 g/dL — ABNORMAL LOW (ref 12.0–15.0)
MCH: 25.6 pg — AB (ref 26.0–34.0)
MCHC: 31.1 g/dL (ref 30.0–36.0)
MCV: 82.4 fL (ref 78.0–100.0)
Platelets: ADEQUATE 10*3/uL (ref 150–400)
RBC: 4.14 MIL/uL (ref 3.87–5.11)
RDW: 19 % — ABNORMAL HIGH (ref 11.5–15.5)
WBC: 9.9 10*3/uL (ref 4.0–10.5)

## 2014-06-16 LAB — SODIUM, URINE, RANDOM: SODIUM UR: 33 meq/L

## 2014-06-16 LAB — HEMOGLOBIN A1C
Hgb A1c MFr Bld: 9 % — ABNORMAL HIGH (ref <5.7)
Mean Plasma Glucose: 212 mg/dL — ABNORMAL HIGH (ref <117)

## 2014-06-16 LAB — PROCALCITONIN: PROCALCITONIN: 0.95 ng/mL

## 2014-06-16 LAB — CREATININE, URINE, RANDOM: CREATININE, URINE: 93.3 mg/dL

## 2014-06-16 MED ORDER — BUDESONIDE 0.5 MG/2ML IN SUSP
0.5000 mg | Freq: Two times a day (BID) | RESPIRATORY_TRACT | Status: DC
  Administered 2014-06-16 – 2014-06-22 (×12): 0.5 mg via RESPIRATORY_TRACT
  Filled 2014-06-16 (×14): qty 2

## 2014-06-16 MED ORDER — SODIUM CHLORIDE 0.9 % IV SOLN
INTRAVENOUS | Status: DC
  Administered 2014-06-16: 75 mL/h via INTRAVENOUS

## 2014-06-16 MED ORDER — LORAZEPAM 2 MG/ML IJ SOLN
0.5000 mg | INTRAMUSCULAR | Status: DC | PRN
  Administered 2014-06-16 – 2014-06-17 (×3): 0.5 mg via INTRAVENOUS
  Administered 2014-06-18: 1 mg via INTRAVENOUS
  Administered 2014-06-18 – 2014-06-20 (×3): 0.5 mg via INTRAVENOUS
  Filled 2014-06-16 (×7): qty 1

## 2014-06-16 MED ORDER — HYDRALAZINE HCL 50 MG PO TABS
50.0000 mg | ORAL_TABLET | Freq: Three times a day (TID) | ORAL | Status: DC
  Administered 2014-06-16 – 2014-06-18 (×6): 50 mg via ORAL
  Filled 2014-06-16 (×8): qty 1

## 2014-06-16 MED ORDER — METHYLPREDNISOLONE SODIUM SUCC 125 MG IJ SOLR
60.0000 mg | Freq: Two times a day (BID) | INTRAMUSCULAR | Status: DC
  Administered 2014-06-16 – 2014-06-19 (×6): 60 mg via INTRAVENOUS
  Filled 2014-06-16 (×6): qty 2

## 2014-06-16 MED ORDER — ENOXAPARIN SODIUM 30 MG/0.3ML ~~LOC~~ SOLN
30.0000 mg | Freq: Every day | SUBCUTANEOUS | Status: DC
  Administered 2014-06-16 – 2014-06-21 (×6): 30 mg via SUBCUTANEOUS
  Filled 2014-06-16 (×7): qty 0.3

## 2014-06-16 MED ORDER — FUROSEMIDE 10 MG/ML IJ SOLN
80.0000 mg | Freq: Once | INTRAMUSCULAR | Status: AC
  Administered 2014-06-16: 80 mg via INTRAVENOUS
  Filled 2014-06-16: qty 8

## 2014-06-16 NOTE — Progress Notes (Signed)
RN removed Pt from BIPAP and placed on 2 LPM [place].  Pt tolerating well at this time, RT to monitor and assess as needed.

## 2014-06-16 NOTE — Progress Notes (Signed)
INITIAL NUTRITION ASSESSMENT  DOCUMENTATION CODES Per approved criteria  -Obesity Unspecified   INTERVENTION: - Ensure Complete po BID, each supplement provides 350 kcal and 13 grams of protein  NUTRITION DIAGNOSIS: Inadequate oral intake related to COPD exacerbation as evidenced by reported poor appetite.   Goal: Pt to meet >/= 90% of their estimated nutrition needs   Monitor:  Weight trend, po intake, acceptance of supplements, labs  Reason for Assessment: MST  81 y.o. female  Admitting Dx: <principal problem not specified>  ASSESSMENT: Patietn have had cold like symptoms for the past 2 months. Increased cough productive of white sputum. NO fever or chest pain. She is not on chronic oxygen. Patient started to have gradually worse wheezing and increase of work of breathing.   - Pt reports poor appetite recently. She has not had anything to eat today. RD ordered breakfast tray for pt. She said that she started to feel a little hungry after talking about food. Pt sometimes drinks Ensure at home. She reports no significant weight loss.  - Pt with no signs of fat or muscle depletion.   Labs: CBGs 125-204 Na WNL K elevated BUN elevated  Height: Ht Readings from Last 1 Encounters:  06/14/14 5' 1.5" (1.562 m)    Weight: Wt Readings from Last 1 Encounters:  06/16/14 182 lb 5.1 oz (82.7 kg)    Ideal Body Weight: 49 kg  % Ideal Body Weight: 169%  Wt Readings from Last 10 Encounters:  06/16/14 182 lb 5.1 oz (82.7 kg)  02/04/14 187 lb 8 oz (85.049 kg)  12/28/13 176 lb (79.833 kg)  10/17/13 188 lb 7.9 oz (85.5 kg)  05/29/13 192 lb 1.6 oz (87.136 kg)  03/24/13 196 lb 9.6 oz (89.177 kg)  10/05/12 172 lb 1.6 oz (78.064 kg)  07/26/12 184 lb 6.4 oz (83.643 kg)  07/09/12 176 lb (79.833 kg)  06/23/12 176 lb 12.9 oz (80.2 kg)    Usual Body Weight: 179 lbs  % Usual Body Weight: 102%  BMI:  Body mass index is 33.9 kg/(m^2).  Estimated Nutritional Needs: Kcal:  1500-1700 Protein: 95-115 g Fluid: 1.7 L/day  Skin: Intact  Diet Order: Diet Heart  EDUCATION NEEDS: -Education needs addressed   Intake/Output Summary (Last 24 hours) at 06/16/14 0954 Last data filed at 06/16/14 0900  Gross per 24 hour  Intake    235 ml  Output    785 ml  Net   -550 ml    Last BM: 1/21   Labs:   Recent Labs Lab 06/15/14 0615 06/15/14 2000 06/16/14 0445  NA 132* 132* 136  K 4.3 5.1 5.6*  CL 100 101 107  CO2 20 18* 21  BUN 31* 43* 50*  CREATININE 1.52* 1.78* 1.88*  CALCIUM 8.6 8.6 8.8  MG 1.7  --   --   PHOS 5.1*  --   --   GLUCOSE 242* 324* 150*    CBG (last 3)   Recent Labs  06/15/14 2320 06/16/14 0448 06/16/14 0741  GLUCAP 204* 143* 125*    Scheduled Meds: . aspirin  325 mg Oral Once  . bisoprolol  10 mg Oral Daily  . budesonide (PULMICORT) nebulizer solution  0.5 mg Nebulization BID  . docusate sodium  100 mg Oral BID  . donepezil  10 mg Oral QHS  . enoxaparin (LOVENOX) injection  40 mg Subcutaneous QHS  . feeding supplement (ENSURE COMPLETE)  237 mL Oral BID BM  . fluticasone  2 spray Each Nare Daily  .  furosemide  80 mg Intravenous Once  . hydrALAZINE  50 mg Oral 3 times per day  . insulin aspart  0-15 Units Subcutaneous 6 times per day  . ipratropium  0.5 mg Nebulization Q6H  . isosorbide mononitrate  60 mg Oral Daily  . ketorolac  1 drop Both Eyes QID  . latanoprost  1 drop Both Eyes QHS  . levalbuterol  1.25 mg Nebulization 4 times per day  . loratadine  10 mg Oral Daily  . methylPREDNISolone (SOLU-MEDROL) injection  60 mg Intravenous Q12H  . pantoprazole  40 mg Oral Daily  . prednisoLONE acetate  1 drop Both Eyes QID  . pregabalin  50 mg Oral TID  . sodium chloride  3 mL Intravenous Q12H  . sodium chloride  3 mL Intravenous Q12H    Continuous Infusions: . sodium chloride 75 mL/hr (06/16/14 0801)  . nitroGLYCERIN Stopped (06/15/14 2005)    Past Medical History  Diagnosis Date  . GERD (gastroesophageal reflux  disease)   . Hypertension   . Diverticulitis   . Gout   . Essential and other specified forms of tremor   . Dementia   . CAD (coronary artery disease)   . Anemia, chronic disease   . Left arm pain   . CHF (congestive heart failure)     Diastolic dysfunction  . Pneumonia     child  . Acute renal failure (ARF)     denies  . Seizures     r/t stroke no rx  . Arthritis   . Diabetes mellitus     no rx for 6 months  . Stroke 2014  . CAD 04/25/2009    Qualifier: Diagnosis of  By: Shirlee Latch, MD, [place]      Past Surgical History  Procedure Laterality Date  . Dilation and curettage of uterus    . Tumor removal      abdomen  . Abdominal hysterectomy    . Cataract extraction w/phaco Left 03/30/2013    Procedure: CATARACT EXTRACTION PHACO AND INTRAOCULAR LENS PLACEMENT (IOC);  Surgeon: Shade Flood, MD;  Location: [facility] OR;  Service: Ophthalmology;  Laterality: Left;  . Appendectomy    . Colonoscopy    . Fracture surgery Left     foot and ankle  . Pars plana vitrectomy Left 12/28/2013    Procedure: PARS PLANA VITRECTOMY WITH 23 GAUGE LEFT EYE ;  Surgeon: Shade Flood, MD;  Location: [facility] OR;  Service: Ophthalmology;  Laterality: Left;    Emmaline Kluver MS, RD, LDN

## 2014-06-16 NOTE — Progress Notes (Signed)
Inpatient Diabetes Program Recommendations  AACE/ADA: New Consensus Statement on Inpatient Glycemic Control (2013)  Target Ranges:  Prepandial:   less than 140 mg/dL      Peak postprandial:   less than 180 mg/dL (1-2 hours)      Critically ill patients:  140 - 180 mg/dL     Results for AMIYLA, PLOTT (MRN [id number]) as of 06/16/2014 08:39  Ref. Range 06/14/2014 23:07 06/15/2014 05:13 06/15/2014 08:09 06/15/2014 11:48 06/15/2014 20:22  Glucose-Capillary Latest Range: 70-99 mg/dL 794 (H) 801 (H) 655 (H) 194 (H) 307 (H)    Results for CHRISTYANN, SHAKE (MRN [id number]) as of 06/16/2014 08:39  Ref. Range 06/15/2014 23:20 06/16/2014 04:48  Glucose-Capillary Latest Range: 70-99 mg/dL 675 (H) 449 (H)    Results for AVIENDA, SHINNICK (MRN [id number]) as of 06/16/2014 08:39  Ref. Range 06/03/2014 23:55  Hgb A1c MFr Bld Latest Range: <5.7 % 8.9 (H)     Admitted with Acute respiratory failure with hypoxia due to COPD exacerbation.  History of DM.   Home DM Meds: Lantus 18 units QHS  (per notes, patient may not be taking insulins at home due to cost reasons)       Novolog 2 units tidwc   Current Orders: Novolog Moderate SSI Q4 hours    **Note IV Solumedrol increased to 60 mg Q8 hours today.  Glucose levels consistently elevated.  **A1c shows sub-optimal glucose control at home.  Not sure if patient has been taking her insulins regularly.  Per home med rec notes, patient's husband stated she was not taking Lantus and Novolog due to cost issues.  May need to switch patient to 70/30 insulin bidwc at time of discharge for cost considerations.  Patient can purchase Reli-on 70/30 insulin for $25 per vial at [facility].  This particular 70/30 insulin does NOT come in pen form so patient would need Rx for insulin syringes as well at time of d/c.   MD- Please consider adding low dose basal insulin to inpatient insulin regimen:  Lantus 12 units QHS (0.15 units/kg dosing based on weight of 87  kg)    Will follow Ambrose Finland RN, MSN, CDE Diabetes Coordinator Inpatient Diabetes Program Team Pager: [phone] (8a-10p)

## 2014-06-16 NOTE — Progress Notes (Signed)
[facility] PROGRESS NOTE  Filed Weights   06/14/14 2321 06/16/14 0555  Weight: 84.4 kg (186 lb 1.1 oz) 82.7 kg (182 lb 5.1 oz)        Intake/Output Summary (Last 24 hours) at 06/16/14 0732 Last data filed at 06/16/14 0600  Gross per 24 hour  Intake    160 ml  Output    725 ml  Net   -565 ml     Assessment/Plan: Acute respiratory failure with hypoxia due to COPD exacerbation - Started on IV antibiotics, steroids and inhalers. - Difficulty breathing on 12.21.2016  had to be moved to SDU. - cont flutter valve and add Chest physiotherapy.  DM (diabetes mellitus) type II controlled with renal manifestation with   Hypoglycemia: - CBG's have improved with insulin adjustment. - A1c is still pending.  AKI on CKD (chronic kidney disease), stage III - Check a FENA. - Start IV NS fluid, most likely due to decrease oral intake. - Not on any nephrotoxic drugs. He is on Ketoralac ophthalmic solution, will d/w pharmacy if this can have a systemic effect. - Got a lasix dose overnight. Hold diuretics.  Chronic diastolic heart failure: - seems to be euvolemic. - hold diuretics.  Essential hypertension - Bp has improved, will hold diuretics.   Code Status: Full Family Communication: no family at bedside Disposition Plan: will transfer out of step down; continue nebulizer, abx's and steroids. PT to see.   Consultants:  none  Procedures: ECHO: none  Antibiotics:  levaquin  HPI/Subjective: Relates her SOB is improved.  Objective: Filed Vitals:   06/16/14 0500 06/16/14 0555 06/16/14 0600 06/16/14 0700  BP: 160/74  158/68 124/60  Pulse:    93  Temp: 98.8 F (37.1 C)  99 F (37.2 C) 99 F (37.2 C)  TempSrc:      Resp: 25  24 22   Height:      Weight:  82.7 kg (182 lb 5.1 oz)    SpO2:    100%     Exam:  General: Alert, awake, oriented x3, in no acute distress.  HEENT: No bruits, no goiter.  Heart: Regular rate and rhythm. Lungs: Moderate air movement,  wheezing B/L Abdomen: Soft, nontender, nondistended, positive bowel sounds.  Neuro: Grossly intact, nonfocal.   Data Reviewed: Basic Metabolic Panel:  Recent Labs Lab 06/14/14 1701 06/15/14 0615 06/15/14 2000 06/16/14 0445  NA 136 132* 132* 136  K 3.9 4.3 5.1 5.6*  CL 105 100 101 107  CO2 19 20 18* 21  GLUCOSE 36* 242* 324* 150*  BUN 25* 31* 43* 50*  CREATININE 1.35* 1.52* 1.78* 1.88*  CALCIUM 8.5 8.6 8.6 8.8  MG  --  1.7  --   --   PHOS  --  5.1*  --   --    Liver Function Tests:  Recent Labs Lab 06/14/14 1701 06/15/14 0615  AST 24 31  ALT 13 15  ALKPHOS 151* 140*  BILITOT 0.6 0.6  PROT 8.0 7.9  ALBUMIN 3.6 3.5   No results for input(s): LIPASE, AMYLASE in the last 168 hours. No results for input(s): AMMONIA in the last 168 hours. CBC:  Recent Labs Lab 06/14/14 1701 06/15/14 0615 06/15/14 2000 06/16/14 0445  WBC 7.3 4.8 9.2 9.9  NEUTROABS 5.1  --  7.6  --   HGB 10.9* 10.1* 10.6* 10.6*  HCT 34.4* 32.7* 33.4* 34.1*  MCV 81.5 82.6 82.1 82.4  PLT 176 176 221 PLATELET CLUMPS NOTED ON SMEAR, COUNT APPEARS ADEQUATE  Cardiac Enzymes:  Recent Labs Lab 06/14/14 1701 06/15/14 0615 06/15/14 1317  TROPONINI 0.10* 0.06* 0.05*   BNP (last 3 results)  Recent Labs  10/14/13 1653  PROBNP 409.0   CBG:  Recent Labs Lab 06/15/14 0809 06/15/14 1148 06/15/14 2022 06/15/14 2320 06/16/14 0448  GLUCAP 201* 194* 307* 204* 143*    Recent Results (from the past 240 hour(s))  MRSA PCR Screening     Status: None   Collection Time: 06/14/14 11:27 PM  Result Value Ref Range Status   MRSA by PCR NEGATIVE NEGATIVE Final    Comment:        The GeneXpert MRSA Assay (FDA approved for NASAL specimens only), is one component of a comprehensive MRSA colonization surveillance program. It is not intended to diagnose MRSA infection nor to guide or monitor treatment for MRSA infections.      Studies: Dg Chest 1 View  06/15/2014   CLINICAL DATA:  Respiratory  distress  EXAM: CHEST - 1 VIEW  COMPARISON:  06/14/2014  FINDINGS: Cardiac silhouette mildly enlarged.  No mediastinal or hilar masses.  Patchy areas of bilateral interstitial and airspace opacity are noted predominantly centrally. This is mildly increased from the previous exam. Findings may be due to pulmonary edema. Multifocal infection could have this appearance.  Lungs are mildly hyperexpanded. No pleural effusion or pneumothorax.  IMPRESSION: Centrally predominant patchy areas of interstitial and airspace opacity, mildly increased from the previous day's study. Findings may be due to acute bronchial inflammation/infection or pulmonary edema.   Electronically Signed   By: Amie Portland M.D.   On: 06/15/2014 18:40   Dg Chest 2 View  06/14/2014   CLINICAL DATA:  Productive cough for 2 months.  EXAM: CHEST  2 VIEW  COMPARISON:  None.  FINDINGS: The heart is mildly enlarged. There is tortuosity and calcification of the thoracic aorta. There are significant bronchitic changes with peribronchial thickening, increased interstitial markings and streaky areas of atelectasis. Findings could be due to severe bronchitis or atypical interstitial pneumonitis but no focal airspace consolidation or pleural effusion. The bony thorax is intact.  IMPRESSION: Significant bronchitic changes without definite infiltrate.   Electronically Signed   By: Loralie Champagne M.D.   On: 06/14/2014 15:39    Scheduled Meds: . aspirin  325 mg Oral Once  . bisoprolol  10 mg Oral Daily  . budesonide (PULMICORT) nebulizer solution  0.25 mg Nebulization Q6H  . docusate sodium  100 mg Oral BID  . donepezil  10 mg Oral QHS  . enoxaparin (LOVENOX) injection  40 mg Subcutaneous QHS  . feeding supplement (ENSURE COMPLETE)  237 mL Oral BID BM  . fluticasone  2 spray Each Nare Daily  . hydrALAZINE  25 mg Oral 3 times per day  . insulin aspart  0-15 Units Subcutaneous 6 times per day  . ipratropium  0.5 mg Nebulization Q6H  . isosorbide  mononitrate  60 mg Oral Daily  . ketorolac  1 drop Both Eyes QID  . latanoprost  1 drop Both Eyes QHS  . levalbuterol  1.25 mg Nebulization 4 times per day  . levofloxacin (LEVAQUIN) IV  250 mg Intravenous Q24H  . loratadine  10 mg Oral Daily  . methylPREDNISolone (SOLU-MEDROL) injection  60 mg Intravenous 3 times per day  . pantoprazole  40 mg Oral Daily  . prednisoLONE acetate  1 drop Both Eyes QID  . pregabalin  50 mg Oral TID  . sodium chloride  3 mL Intravenous Q12H  .  sodium chloride  3 mL Intravenous Q12H   Continuous Infusions: . nitroGLYCERIN Stopped (06/15/14 2005)     Radonna Ricker Rosine Beat  [facility] Pager [phone] 06/16/2014, 7:32 AM  LOS: 2 days

## 2014-06-16 NOTE — Evaluation (Signed)
Physical Therapy Evaluation Patient Details Name: Glenda Gonzalez MRN: [id number] DOB: 1933-06-12 Today's Date: 06/16/2014   History of Present Illness  Pt admitted with COPD on 1.20/16. has required Bipap, SDU  Clinical Impression  Patient was able to mobilize to bed edge but did not attempt standing. No family to discuss caregiver availability. Pt wil benefit from PT to address problems. Recommend OT consult.Blanchard Kelch PT [phone]     Follow Up Recommendations Home health PT;SNF;Supervision/Assistance - 24 hour (no family to diiscuss. pt said no to SNF)    Equipment Recommendations  None recommended by PT    Recommendations for Other Services       Precautions / Restrictions Precautions Precautions: Fall Precaution Comments: monitor VS,       Mobility  Bed Mobility Overal bed mobility: Needs Assistance Bed Mobility: Supine to Sit;Sit to Supine     Supine to sit: Mod assist;HOB elevated Sit to supine: Mod assist   General bed mobility comments: pt die  assist , support  for trunk and legs  Transfers                 General transfer comment: nt  Ambulation/Gait                Stairs            Wheelchair Mobility    Modified Rankin (Stroke Patients Only)       Balance Overall balance assessment: Needs assistance Sitting-balance support: Feet unsupported;Bilateral upper extremity supported Sitting balance-Leahy Scale: Fair                                       Pertinent Vitals/Pain Pain Assessment: No/denies pain    Home Living Family/patient expects to be discharged to:: Private residence   Available Help at Discharge: Family;Available 24 hours/day Type of Home: House Home Access: Ramped entrance     Home Layout: One level Home Equipment: Bedside commode;Wheelchair - [employer] - 2 wheels;Walker - 4 wheels;Shower seat Additional Comments: per patient she does not walk and her husband gives her a bath  everyday supine in bed. pt reports being total (A) for BADLs. Pt reports stand pivot to bedside commode at bedside. pt reports she does not walk but instead sits in a w/c    Prior Function Level of Independence: Needs assistance         Comments: walks with cane or RW     Hand Dominance        Extremity/Trunk Assessment   Upper Extremity Assessment: Generalized weakness           Lower Extremity Assessment: Generalized weakness         Communication      Cognition Arousal/Alertness: Awake/alert Behavior During Therapy: WFL for tasks assessed/performed Overall Cognitive Status: Within Functional Limits for tasks assessed                      General Comments      Exercises        Assessment/Plan    PT Assessment Patient needs continued PT services  PT Diagnosis Difficulty walking;Generalized weakness   PT Problem List Decreased strength;Decreased activity tolerance;Decreased mobility  PT Treatment Interventions DME instruction;Gait training;Functional mobility training;Therapeutic activities;Therapeutic exercise;Patient/family education   PT Goals (Current goals can be found in the Care Plan section) Acute Rehab PT Goals Patient Stated Goal: to go home soon  PT Goal Formulation: With patient Time For Goal Achievement: 06/30/14 Potential to Achieve Goals: Good    Frequency Min 3X/week   Barriers to discharge        Co-evaluation               End of Session   Activity Tolerance: Patient tolerated treatment well Patient left: in bed;with call bell/phone within reach;with bed alarm set Nurse Communication: Mobility status         Time: 1610-9604 PT Time Calculation (min) (ACUTE ONLY): 23 min   Charges:   PT Evaluation $Initial PT Evaluation Tier I: 1 Procedure PT Treatments $Therapeutic Activity: 23-37 mins   PT G Codes:        Rada Hay 06/16/2014, 5:33 PM

## 2014-06-16 NOTE — Consult Note (Signed)
PULMONARY / CRITICAL CARE MEDICINE   Name: Glenda Gonzalez MRN: [id number] DOB: [birth year]    ADMISSION DATE:  06/14/2014 CONSULTATION DATE:  06/15/2014  REFERRING MD :  Dr. Gwenlyn Perking  CHIEF COMPLAINT:  SOB  INITIAL PRESENTATION: 81 year old female admitted to [facility] 1/20 for presumed COPD exacerbation. Initially improved and transferred out of SDU. 1/21 became acutely dyspneic, hypertensive, and tachycardic. BiPAP started. PCCM consulted.  STUDIES:  1/21 Echo > LVEF 15-20%, Mild MR  SIGNIFICANT EVENTS: 1/20 admit to ICU 1/21 transfer to floor, decompensated, back to ICU, BiPAP.   SUBJECTIVE: feels better this morning  VITAL SIGNS: Temp:  [97.3 F (36.3 C)-99 F (37.2 C)] 99 F (37.2 C) (01/22 0700) Pulse Rate:  [69-110] 93 (01/22 0700) Resp:  [16-34] 22 (01/22 0700) BP: (73-186)/(27-137) 124/60 mmHg (01/22 0700) SpO2:  [94 %-100 %] 100 % (01/22 0836) FiO2 (%):  [30 %] 30 % (01/21 2337) Weight:  [82.7 kg (182 lb 5.1 oz)] 82.7 kg (182 lb 5.1 oz) (01/22 0555) HEMODYNAMICS:   VENTILATOR SETTINGS: Vent Mode:  [-]  FiO2 (%):  [30 %] 30 % INTAKE / OUTPUT:  Intake/Output Summary (Last 24 hours) at 06/16/14 0853 Last data filed at 06/16/14 0600  Gross per 24 hour  Intake    160 ml  Output    725 ml  Net   -565 ml    PHYSICAL EXAMINATION: General:  Resting comfortably HEENT: NCAT EOMi PULM: Wheezing bilaterally, prolonged exhalation CV: Irreg irreg, no mgr, cannot assess JVD AB: BS+, soft, nontender Ext: edema in ankles bilaterally Neuro: Awake and alert, maew  LABS:  CBC  Recent Labs Lab 06/15/14 0615 06/15/14 2000 06/16/14 0445  WBC 4.8 9.2 9.9  HGB 10.1* 10.6* 10.6*  HCT 32.7* 33.4* 34.1*  PLT 176 221 PLATELET CLUMPS NOTED ON SMEAR, COUNT APPEARS ADEQUATE   Coag's No results for input(s): APTT, INR in the last 168 hours. BMET  Recent Labs Lab 06/15/14 0615 06/15/14 2000 06/16/14 0445  NA 132* 132* 136  K 4.3 5.1 5.6*  CL 100 101 107  CO2 20 18*  21  BUN 31* 43* 50*  CREATININE 1.52* 1.78* 1.88*  GLUCOSE 242* 324* 150*   Electrolytes  Recent Labs Lab 06/15/14 0615 06/15/14 2000 06/16/14 0445  CALCIUM 8.6 8.6 8.8  MG 1.7  --   --   PHOS 5.1*  --   --    Sepsis Markers  Recent Labs Lab 06/15/14 2031 06/15/14 2032 06/16/14 0445  LATICACIDVEN 2.7*  --   --   PROCALCITON  --  0.26 0.95   ABG  Recent Labs Lab 06/14/14 2138 06/15/14 1745 06/15/14 2050  PHART 7.384 7.283* 7.343*  PCO2ART 33.0* 37.4 34.5*  PO2ART 65.7* 67.9* 87.1   Liver Enzymes  Recent Labs Lab 06/14/14 1701 06/15/14 0615  AST 24 31  ALT 13 15  ALKPHOS 151* 140*  BILITOT 0.6 0.6  ALBUMIN 3.6 3.5   Cardiac Enzymes  Recent Labs Lab 06/14/14 1701 06/15/14 0615 06/15/14 1317  TROPONINI 0.10* 0.06* 0.05*   Glucose  Recent Labs Lab 06/15/14 0809 06/15/14 1148 06/15/14 2022 06/15/14 2320 06/16/14 0448 06/16/14 0741  GLUCAP 201* 194* 307* 204* 143* 125*    Imaging Dg Chest 1 View  06/15/2014   CLINICAL DATA:  Respiratory distress  EXAM: CHEST - 1 VIEW  COMPARISON:  06/14/2014  FINDINGS: Cardiac silhouette mildly enlarged.  No mediastinal or hilar masses.  Patchy areas of bilateral interstitial and airspace opacity are noted predominantly centrally.  This is mildly increased from the previous exam. Findings may be due to pulmonary edema. Multifocal infection could have this appearance.  Lungs are mildly hyperexpanded. No pleural effusion or pneumothorax.  IMPRESSION: Centrally predominant patchy areas of interstitial and airspace opacity, mildly increased from the previous day's study. Findings may be due to acute bronchial inflammation/infection or pulmonary edema.   Electronically Signed   By: Amie Portland M.D.   On: 06/15/2014 18:40     ASSESSMENT / PLAN:  PULMONARY A: Acute hypoxemic respiratory failure. > suspect mixed picture of COPD and CHF exacerbation; could be viral process causing both Suspect COPD, never had  PFT Pulmonary Edema 2nd to A on C systolic CHF P:   BiPAP prn Continue systemic steroids, wean 1/22 Continue scheduled bronchodilators/budesonide PRN levalbuterol Daily CXR Repeat ABG Monitor in ICU D/C chest PT   CARDIOVASCULAR A:  Acute on chronic systolic  (LVEF 15-20%) Hypertensive urgency H/o CAD Troponin elevation P:  Telemetry monitoring Lasix  IV now Increase hydralazine scheduled Continue IMDUR Would consult cardiology PRN hydralazine SBP goal < 160 mm/Hg Cardiology consult in AM   RENAL A:   CKD  Hyperkalemia P:   Follow Bmet and UOP Replace electrolytes as indicated   GASTROINTESTINAL A:  GERD P:   Advance diet SUP: po PPI   HEMATOLOGIC A:   Microcytic anemia, chronic  P:  Follow CBC Enoxaparin for VTE ppx  INFECTIOUS A:   CXR reviewed, don't think this is pneumonia (no fevers, no purulent sputum), favor pulm edema vs viral process  P:   Doxy 1/20 > 1/21 D/C Levaquin  Respiratory viral panel 1/22 >>  ENDOCRINE A:   DM  P:   Per [facility]  NEUROLOGIC A:   No acute issues H/o CVA  P:   Monitor   FAMILY  - Updates: None bedside  Today's summary> suspect this is mostly CHF, but agree could be some component of COPD exacerbation.  In years past I have seen many elderly this time of year with a viral process (metapneumo, rhinovirus) causing both CHF and COPD exacerbation.  Best to treat with steroids and lasix, hold Abx.  Would consult cardiology  Heber Carpio, MD [facility] Pager: [phone] Cell: [phone] If no response, call [phone]

## 2014-06-16 NOTE — Care Management Note (Signed)
    Page 1 of 1   06/16/2014     11:32:43 AM CARE MANAGEMENT NOTE 06/16/2014  Patient:  Glenda Gonzalez, Glenda Gonzalez   Account Number:  [id number]  Date Initiated:  06/15/2014  Documentation initiated by:  Kathrene Bongo Assessment:   81 yo female admitted with COPD exacerbation from home     Action/Plan:   discharge planning   Anticipated DC Date:  06/18/2014   Anticipated DC Plan:  HOME/SELF CARE      DC Planning Services  CM consult      Choice offered to / List presented to:             Status of service:   Medicare Important Message given?  YES (If response is "NO", the following Medicare IM given date fields will be blank) Date Medicare IM given:  06/16/2014 Medicare IM given by:  [facility] Date Additional Medicare IM given:   Additional Medicare IM given by:    Discharge Disposition:    Per UR Regulation:  Reviewed for med. necessity/level of care/duration of stay  If discussed at Long Length of Stay Meetings, dates discussed:    Comments:  06/15/14 Ferdinand Cava RN [facility] CM [phone] Patient stated that she lives at home with her husband and 2 sons. She has a walker, cane, and BSC. She stated she has a PCP and pharmacy and her spouse drives her to appointments. Will continue to follow for dc needs

## 2014-06-17 ENCOUNTER — Inpatient Hospital Stay (HOSPITAL_COMMUNITY): Payer: Medicare Other

## 2014-06-17 DIAGNOSIS — E875 Hyperkalemia: Secondary | ICD-10-CM

## 2014-06-17 DIAGNOSIS — R05 Cough: Secondary | ICD-10-CM

## 2014-06-17 DIAGNOSIS — I5033 Acute on chronic diastolic (congestive) heart failure: Secondary | ICD-10-CM

## 2014-06-17 LAB — BASIC METABOLIC PANEL
Anion gap: 9 (ref 5–15)
BUN: 69 mg/dL — AB (ref 6–23)
CO2: 21 mmol/L (ref 19–32)
CREATININE: 2.17 mg/dL — AB (ref 0.50–1.10)
Calcium: 8.2 mg/dL — ABNORMAL LOW (ref 8.4–10.5)
Chloride: 102 mmol/L (ref 96–112)
GFR calc non Af Amer: 21 mL/min — ABNORMAL LOW (ref >90)
GFR, EST AFRICAN AMERICAN: 24 mL/min — AB (ref >90)
GLUCOSE: 183 mg/dL — AB (ref 70–99)
Potassium: 4.5 mmol/L (ref 3.5–5.1)
SODIUM: 132 mmol/L — AB (ref 135–145)

## 2014-06-17 LAB — GLUCOSE, CAPILLARY
GLUCOSE-CAPILLARY: 165 mg/dL — AB (ref 70–99)
GLUCOSE-CAPILLARY: 164 mg/dL — AB (ref 70–99)
GLUCOSE-CAPILLARY: 165 mg/dL — AB (ref 70–99)
Glucose-Capillary: 165 mg/dL — ABNORMAL HIGH (ref 70–99)
Glucose-Capillary: 128 mg/dL — ABNORMAL HIGH (ref 70–99)

## 2014-06-17 LAB — PROCALCITONIN: Procalcitonin: 1.07 ng/mL

## 2014-06-17 MED ORDER — GUAIFENESIN 200 MG PO TABS
200.0000 mg | ORAL_TABLET | ORAL | Status: DC | PRN
  Administered 2014-06-17: 200 mg via ORAL
  Filled 2014-06-17 (×2): qty 1

## 2014-06-17 MED ORDER — SODIUM CHLORIDE 0.9 % IV SOLN
INTRAVENOUS | Status: AC
  Administered 2014-06-17: 75 mL/h via INTRAVENOUS

## 2014-06-17 MED ORDER — INSULIN ASPART 100 UNIT/ML ~~LOC~~ SOLN
0.0000 [IU] | Freq: Three times a day (TID) | SUBCUTANEOUS | Status: DC
  Administered 2014-06-17 (×2): 2 [IU] via SUBCUTANEOUS
  Administered 2014-06-17: 1 [IU] via SUBCUTANEOUS
  Administered 2014-06-18: 2 [IU] via SUBCUTANEOUS
  Administered 2014-06-18: 1 [IU] via SUBCUTANEOUS
  Administered 2014-06-19: 5 [IU] via SUBCUTANEOUS
  Administered 2014-06-19: 2 [IU] via SUBCUTANEOUS
  Administered 2014-06-20 (×2): 5 [IU] via SUBCUTANEOUS

## 2014-06-17 MED ORDER — INSULIN ASPART 100 UNIT/ML ~~LOC~~ SOLN
3.0000 [IU] | Freq: Three times a day (TID) | SUBCUTANEOUS | Status: DC
  Administered 2014-06-17: 3 [IU] via SUBCUTANEOUS

## 2014-06-17 MED ORDER — INSULIN ASPART 100 UNIT/ML ~~LOC~~ SOLN: 0.0000 [IU] | Freq: Every day | SUBCUTANEOUS | Status: DC

## 2014-06-17 MED ORDER — INSULIN DETEMIR 100 UNIT/ML ~~LOC~~ SOLN
5.0000 [IU] | Freq: Every day | SUBCUTANEOUS | Status: DC
  Administered 2014-06-17 – 2014-06-19 (×3): 5 [IU] via SUBCUTANEOUS
  Filled 2014-06-17 (×4): qty 0.05

## 2014-06-17 MED ORDER — INSULIN DETEMIR 100 UNIT/ML ~~LOC~~ SOLN: 10.0000 [IU] | Freq: Every day | SUBCUTANEOUS | Status: DC

## 2014-06-17 NOTE — Plan of Care (Signed)
Problem: Phase I Progression Outcomes Goal: Tolerating diet Outcome: Not Progressing Extremely poor appetite.

## 2014-06-17 NOTE — Progress Notes (Signed)
PULMONARY / CRITICAL CARE MEDICINE   Name: Glenda Gonzalez MRN: [id number] DOB: Jan 15, 1934    ADMISSION DATE:  06/14/2014 CONSULTATION DATE:  06/15/2014  REFERRING MD :  Dr. Gwenlyn Perking  CHIEF COMPLAINT:  SOB  INITIAL PRESENTATION: 81 year old female admitted to [facility] 1/20 for presumed COPD exacerbation. Initially improved and transferred out of SDU. 1/21 became acutely dyspneic, hypertensive, and tachycardic. BiPAP started. PCCM consulted.  STUDIES:  1/21 Echo > LVEF 15-20%, Mild MR  SIGNIFICANT EVENTS: 1/20 admit to ICU 1/21 transfer to floor, decompensated, back to ICU, BiPAP.   SUBJECTIVE:  Says she is still having dyspnea, has bee coughing with some sputum  VITAL SIGNS: Temp:  [97.3 F (36.3 C)-99.5 F (37.5 C)] 97.3 F (36.3 C) (01/23 0800) Pulse Rate:  [78-101] 78 (01/23 0800) Resp:  [16-26] 18 (01/23 0800) BP: (129-164)/(54-113) 139/77 mmHg (01/23 0340) SpO2:  [94 %-100 %] 94 % (01/23 0800) HEMODYNAMICS:   VENTILATOR SETTINGS:   INTAKE / OUTPUT:  Intake/Output Summary (Last 24 hours) at 06/17/14 0943 Last data filed at 06/17/14 0900  Gross per 24 hour  Intake   3665 ml  Output   1535 ml  Net   2130 ml    PHYSICAL EXAMINATION: General:  Resting comfortably HEENT: NCAT EOMi PULM: she has wheeze and stridor on inspiration, not on expiration CV: Irreg irreg, no mgr, cannot assess JVD AB: BS+, soft, nontender Ext: edema in ankles bilaterally Neuro: Awake, slow to answer questions, moves all ext, some resting tremor  LABS:  CBC  Recent Labs Lab 06/15/14 0615 06/15/14 2000 06/16/14 0445  WBC 4.8 9.2 9.9  HGB 10.1* 10.6* 10.6*  HCT 32.7* 33.4* 34.1*  PLT 176 221 PLATELET CLUMPS NOTED ON SMEAR, COUNT APPEARS ADEQUATE   Coag's No results for input(s): APTT, INR in the last 168 hours. BMET  Recent Labs Lab 06/15/14 2000 06/16/14 0445 06/17/14 0335  NA 132* 136 132*  K 5.1 5.6* 4.5  CL 101 107 102  CO2 18* 21 21  BUN 43* 50* 69*  CREATININE  1.78* 1.88* 2.17*  GLUCOSE 324* 150* 183*   Electrolytes  Recent Labs Lab 06/15/14 0615 06/15/14 2000 06/16/14 0445 06/17/14 0335  CALCIUM 8.6 8.6 8.8 8.2*  MG 1.7  --   --   --   PHOS 5.1*  --   --   --    Sepsis Markers  Recent Labs Lab 06/15/14 2031 06/15/14 2032 06/16/14 0445 06/17/14 0335  LATICACIDVEN 2.7*  --   --   --   PROCALCITON  --  0.26 0.95 1.07   ABG  Recent Labs Lab 06/14/14 2138 06/15/14 1745 06/15/14 2050  PHART 7.384 7.283* 7.343*  PCO2ART 33.0* 37.4 34.5*  PO2ART 65.7* 67.9* 87.1   Liver Enzymes  Recent Labs Lab 06/14/14 1701 06/15/14 0615  AST 24 31  ALT 13 15  ALKPHOS 151* 140*  BILITOT 0.6 0.6  ALBUMIN 3.6 3.5   Cardiac Enzymes  Recent Labs Lab 06/14/14 1701 06/15/14 0615 06/15/14 1317  TROPONINI 0.10* 0.06* 0.05*   Glucose  Recent Labs Lab 06/16/14 1144 06/16/14 1723 06/16/14 2025 06/16/14 2340 06/17/14 0332 06/17/14 0759  GLUCAP 238* 188* 197* 171* 165* 128*    Imaging No results found.   ASSESSMENT / PLAN:  PULMONARY A: Acute hypoxemic respiratory failure. > suspect mixed picture of COPD and CHF exacerbation; could be viral process causing both Suspect COPD, never had PFT Probable UA irritation > her wheeze is on inspiration Pulmonary Edema 2nd to  A on C systolic CHF P:   BiPAP prn Continue systemic steroids Continue scheduled bronchodilators/budesonide PRN levalbuterol Repeat CXR 1/23 Monitor in ICU   CARDIOVASCULAR A:  Acute on chronic systolic  (LVEF 15-20%) Hypertensive urgency H/o CAD Troponin elevation P:  Telemetry monitoring Increase hydralazine scheduled,  add PRN hydralazine Continue IMDUR Would consult cardiology SBP goal < 160 mm/Hg Repeat CXR now to decide if more diuresis indicated   RENAL A:   CKD  Hyperkalemia P:   Follow Bmet and UOP Replace electrolytes as indicated   GASTROINTESTINAL A:  GERD P:   Advance diet SUP: po PPI   HEMATOLOGIC A:    Microcytic anemia, chronic  P:  Follow CBC Enoxaparin for VTE ppx  INFECTIOUS A:   CXR reviewed, suspect pulm edema vs viral process  P:   Doxy 1/20 > 1/21 D/C Levaquin  Respiratory viral panel 1/22 >>  ENDOCRINE A:   DM  P:   Per [facility]  NEUROLOGIC A:   No acute issues H/o CVA  P:   Monitor   FAMILY  - Updates: None bedside  Today's summary> suspect this is mostly CHF, but agree could be some component of COPD exacerbation, possibly a viral process (metapneumo, rhinovirus) causing both CHF and COPD exacerbation.  Best to treat with steroids and lasix, hold Abx. Repeat CXR 1/23 to assess infiltrates   Would still consult cardiology  Levy Pupa, MD, PhD 06/17/2014, 9:54 AM [facility] [phone] or if no answer [phone]

## 2014-06-17 NOTE — Progress Notes (Signed)
[facility] PROGRESS NOTE  Filed Weights   06/14/14 2321 06/16/14 0555  Weight: 84.4 kg (186 lb 1.1 oz) 82.7 kg (182 lb 5.1 oz)        Intake/Output Summary (Last 24 hours) at 06/17/14 3361 Last data filed at 06/17/14 0500  Gross per 24 hour  Intake   3590 ml  Output   1525 ml  Net   2065 ml     Assessment/Plan: Acute respiratory failure with hypoxia due to COPD exacerbation - Cont IV antibiotics, steroids and inhalers. - Still with wheezing. - Cont  Chest physiotherapy. Add mucolytic.  DM (diabetes mellitus) type II controlled with renal manifestation with   Hypoglycemia: - Start lantus low dose plus SSI.Marland Kitchen BG is trending up. - A1c is 9.0,   AKI on CKD (chronic kidney disease), stage III - FENA < 1%. - Cont IV NS fluid, most likely due to decrease oral intake. - Not on any nephrotoxic drugs. She is only on Ketoralac ophthalmic solution. - Hold diuretics.  Chronic diastolic heart failure: - seems to be euvolemic. - hold diuretics.  Essential hypertension - Bp has improved, will hold diuretics.   Hyperkalemia: - most like due to decrease intravascular vol. - now resolved.  Code Status: Full Family Communication: no family at bedside Disposition Plan: will transfer out of step down; continue nebulizer, abx's and steroids. PT to see.   Consultants:  none  Procedures: ECHO: none  Antibiotics:  levaquin  HPI/Subjective: SOB is improved.  Objective: Filed Vitals:   06/16/14 2013 06/16/14 2315 06/17/14 0156 06/17/14 0340  BP:  151/71  139/77  Pulse:  89  80  Temp:  99 F (37.2 C)  97.7 F (36.5 C)  TempSrc:      Resp:  18  18  Height:      Weight:      SpO2: 100% 100% 100% 96%     Exam:  General: Alert, awake, oriented x3, in no acute distress.  HEENT: No bruits, no goiter.  Heart: Regular rate and rhythm. Lungs: Moderate air movement, wheezing B/L Abdomen: Soft, nontender, nondistended, positive bowel sounds.  Neuro: Grossly  intact, nonfocal.  Data Reviewed: Basic Metabolic Panel:  Recent Labs Lab 06/14/14 1701 06/15/14 0615 06/15/14 2000 06/16/14 0445 06/17/14 0335  NA 136 132* 132* 136 132*  K 3.9 4.3 5.1 5.6* 4.5  CL 105 100 101 107 102  CO2 19 20 18* 21 21  GLUCOSE 36* 242* 324* 150* 183*  BUN 25* 31* 43* 50* 69*  CREATININE 1.35* 1.52* 1.78* 1.88* 2.17*  CALCIUM 8.5 8.6 8.6 8.8 8.2*  MG  --  1.7  --   --   --   PHOS  --  5.1*  --   --   --    Liver Function Tests:  Recent Labs Lab 06/14/14 1701 06/15/14 0615  AST 24 31  ALT 13 15  ALKPHOS 151* 140*  BILITOT 0.6 0.6  PROT 8.0 7.9  ALBUMIN 3.6 3.5   No results for input(s): LIPASE, AMYLASE in the last 168 hours. No results for input(s): AMMONIA in the last 168 hours. CBC:  Recent Labs Lab 06/14/14 1701 06/15/14 0615 06/15/14 2000 06/16/14 0445  WBC 7.3 4.8 9.2 9.9  NEUTROABS 5.1  --  7.6  --   HGB 10.9* 10.1* 10.6* 10.6*  HCT 34.4* 32.7* 33.4* 34.1*  MCV 81.5 82.6 82.1 82.4  PLT 176 176 221 PLATELET CLUMPS NOTED ON SMEAR, COUNT APPEARS ADEQUATE   Cardiac Enzymes:  Recent Labs Lab 06/14/14 1701 06/15/14 0615 06/15/14 1317  TROPONINI 0.10* 0.06* 0.05*   BNP (last 3 results)  Recent Labs  10/14/13 1653  PROBNP 409.0   CBG:  Recent Labs Lab 06/16/14 1144 06/16/14 1723 06/16/14 2025 06/16/14 2340 06/17/14 0332  GLUCAP 238* 188* 197* 171* 165*    Recent Results (from the past 240 hour(s))  MRSA PCR Screening     Status: None   Collection Time: 06/14/14 11:27 PM  Result Value Ref Range Status   MRSA by PCR NEGATIVE NEGATIVE Final    Comment:        The GeneXpert MRSA Assay (FDA approved for NASAL specimens only), is one component of a comprehensive MRSA colonization surveillance program. It is not intended to diagnose MRSA infection nor to guide or monitor treatment for MRSA infections.   Culture, Urine     Status: None   Collection Time: 06/15/14  5:47 AM  Result Value Ref Range Status    Specimen Description URINE, CLEAN CATCH  Final   Special Requests Normal  Final   Colony Count   Final    50,000 COLONIES/ML Performed at [facility]    Culture   Final    Multiple bacterial morphotypes present, none predominant. Suggest appropriate recollection if clinically indicated. Performed at [facility]    Report Status 06/16/2014 FINAL  Final     Studies: Dg Chest 1 View  06/15/2014   CLINICAL DATA:  Respiratory distress  EXAM: CHEST - 1 VIEW  COMPARISON:  06/14/2014  FINDINGS: Cardiac silhouette mildly enlarged.  No mediastinal or hilar masses.  Patchy areas of bilateral interstitial and airspace opacity are noted predominantly centrally. This is mildly increased from the previous exam. Findings may be due to pulmonary edema. Multifocal infection could have this appearance.  Lungs are mildly hyperexpanded. No pleural effusion or pneumothorax.  IMPRESSION: Centrally predominant patchy areas of interstitial and airspace opacity, mildly increased from the previous day's study. Findings may be due to acute bronchial inflammation/infection or pulmonary edema.   Electronically Signed   By: Amie Portland M.D.   On: 06/15/2014 18:40    Scheduled Meds: . aspirin  325 mg Oral Once  . bisoprolol  10 mg Oral Daily  . budesonide (PULMICORT) nebulizer solution  0.5 mg Nebulization BID  . docusate sodium  100 mg Oral BID  . donepezil  10 mg Oral QHS  . enoxaparin (LOVENOX) injection  30 mg Subcutaneous QHS  . feeding supplement (ENSURE COMPLETE)  237 mL Oral BID BM  . fluticasone  2 spray Each Nare Daily  . hydrALAZINE  50 mg Oral 3 times per day  . insulin aspart  0-15 Units Subcutaneous 6 times per day  . ipratropium  0.5 mg Nebulization Q6H  . isosorbide mononitrate  60 mg Oral Daily  . ketorolac  1 drop Both Eyes QID  . latanoprost  1 drop Both Eyes QHS  . levalbuterol  1.25 mg Nebulization 4 times per day  . loratadine  10 mg Oral Daily  . methylPREDNISolone  (SOLU-MEDROL) injection  60 mg Intravenous Q12H  . pantoprazole  40 mg Oral Daily  . prednisoLONE acetate  1 drop Both Eyes QID  . pregabalin  50 mg Oral TID   Continuous Infusions: . sodium chloride 75 mL/hr (06/16/14 1103)  . nitroGLYCERIN Stopped (06/15/14 2005)     Radonna Ricker Rosine Beat  [facility] Pager [phone] 06/17/2014, 6:52 AM  LOS: 3 days

## 2014-06-18 ENCOUNTER — Inpatient Hospital Stay (HOSPITAL_COMMUNITY): Payer: Medicare Other

## 2014-06-18 ENCOUNTER — Encounter (HOSPITAL_COMMUNITY): Payer: Self-pay | Admitting: Cardiology

## 2014-06-18 DIAGNOSIS — N179 Acute kidney failure, unspecified: Secondary | ICD-10-CM

## 2014-06-18 DIAGNOSIS — J9600 Acute respiratory failure, unspecified whether with hypoxia or hypercapnia: Secondary | ICD-10-CM

## 2014-06-18 LAB — BLOOD GAS, ARTERIAL
Acid-base deficit: 7.4 mmol/L — ABNORMAL HIGH (ref 0.0–2.0)
BICARBONATE: 17.1 meq/L — AB (ref 20.0–24.0)
DRAWN BY: 422461
O2 CONTENT: 5 L/min
O2 SAT: 98.6 %
PATIENT TEMPERATURE: 97.9
TCO2: 16 mmol/L (ref 0–100)
pCO2 arterial: 32 mmHg — ABNORMAL LOW (ref 35.0–45.0)
pH, Arterial: 7.345 — ABNORMAL LOW (ref 7.350–7.450)
pO2, Arterial: 122 mmHg — ABNORMAL HIGH (ref 80.0–100.0)

## 2014-06-18 LAB — GLUCOSE, CAPILLARY: GLUCOSE-CAPILLARY: 407 mg/dL — AB (ref 70–99)

## 2014-06-18 MED ORDER — SODIUM CHLORIDE 0.9 % IV SOLN
INTRAVENOUS | Status: DC
  Administered 2014-06-18 – 2014-06-19 (×2): via INTRAVENOUS

## 2014-06-18 MED ORDER — DIPHENHYDRAMINE HCL 50 MG/ML IJ SOLN
INTRAMUSCULAR | Status: AC
  Administered 2014-06-18: 12.5 mg via INTRAVENOUS
  Filled 2014-06-18: qty 1

## 2014-06-18 MED ORDER — FUROSEMIDE 10 MG/ML IJ SOLN
40.0000 mg | Freq: Every day | INTRAMUSCULAR | Status: DC
  Administered 2014-06-18 – 2014-06-22 (×5): 40 mg via INTRAVENOUS
  Filled 2014-06-18 (×5): qty 4

## 2014-06-18 MED ORDER — DIPHENHYDRAMINE HCL 50 MG/ML IJ SOLN
12.5000 mg | Freq: Once | INTRAMUSCULAR | Status: AC
  Administered 2014-06-18: 12.5 mg via INTRAVENOUS

## 2014-06-18 NOTE — Progress Notes (Signed)
PULMONARY / CRITICAL CARE MEDICINE   Name: Glenda Gonzalez MRN: [id number] DOB: [birth year]    ADMISSION DATE:  06/14/2014 CONSULTATION DATE:  06/15/2014  REFERRING MD :  Dr. Gwenlyn Perking  CHIEF COMPLAINT:  SOB  INITIAL PRESENTATION: 81 year old female admitted to [facility] ([place]) 1/20 for presumed COPD exacerbation. Initially improved and transferred out of SDU. 1/21 became acutely dyspneic, hypertensive, and tachycardic. CXR with B interstitial infiltrates. BiPAP started. PCCM consulted.  STUDIES:  1/21 Echo > LVEF 15-20%, Mild MR  SIGNIFICANT EVENTS: 1/20 admit to ICU 1/21 transfer to floor, decompensated, back to ICU, BiPAP.   SUBJECTIVE:  Continues to be dyspneic, Oriented but slow to respond Refused BiPAP last night  VITAL SIGNS: Temp:  [97.3 F (36.3 C)-98.8 F (37.1 C)] 97.9 F (36.6 C) (01/24 0835) Pulse Rate:  [58-89] 75 (01/24 0801) Resp:  [19-34] 33 (01/24 0835) BP: (138-213)/(54-96) 147/54 mmHg (01/24 0835) SpO2:  [95 %-100 %] 100 % (01/24 0801) HEMODYNAMICS:   VENTILATOR SETTINGS:   INTAKE / OUTPUT:  Intake/Output Summary (Last 24 hours) at 06/18/14 1016 Last data filed at 06/18/14 0600  Gross per 24 hour  Intake   1500 ml  Output      0 ml  Net   1500 ml    PHYSICAL EXAMINATION: General:  Resting comfortably HEENT: NCAT EOMi PULM: she has wheeze and stridor on inspiration, not on expiration CV: Irreg irreg, no mgr, cannot assess JVD AB: BS+, soft, nontender Ext: edema in ankles bilaterally Neuro: Awake, slow to answer questions, moves all ext, some resting tremor  LABS:  CBC  Recent Labs Lab 06/15/14 0615 06/15/14 2000 06/16/14 0445  WBC 4.8 9.2 9.9  HGB 10.1* 10.6* 10.6*  HCT 32.7* 33.4* 34.1*  PLT 176 221 PLATELET CLUMPS NOTED ON SMEAR, COUNT APPEARS ADEQUATE   Coag's No results for input(s): APTT, INR in the last 168 hours. BMET  Recent Labs Lab 06/15/14 2000 06/16/14 0445 06/17/14 0335  NA 132* 136 132*  K 5.1 5.6* 4.5  CL 101 107 102   CO2 18* 21 21  BUN 43* 50* 69*  CREATININE 1.78* 1.88* 2.17*  GLUCOSE 324* 150* 183*   Electrolytes  Recent Labs Lab 06/15/14 0615 06/15/14 2000 06/16/14 0445 06/17/14 0335  CALCIUM 8.6 8.6 8.8 8.2*  MG 1.7  --   --   --   PHOS 5.1*  --   --   --    Sepsis Markers  Recent Labs Lab 06/15/14 2031 06/15/14 2032 06/16/14 0445 06/17/14 0335  LATICACIDVEN 2.7*  --   --   --   PROCALCITON  --  0.26 0.95 1.07   ABG  Recent Labs Lab 06/15/14 1745 06/15/14 2050 06/18/14 0534  PHART 7.283* 7.343* 7.345*  PCO2ART 37.4 34.5* 32.0*  PO2ART 67.9* 87.1 122.0*   Liver Enzymes  Recent Labs Lab 06/14/14 1701 06/15/14 0615  AST 24 31  ALT 13 15  ALKPHOS 151* 140*  BILITOT 0.6 0.6  ALBUMIN 3.6 3.5   Cardiac Enzymes  Recent Labs Lab 06/14/14 1701 06/15/14 0615 06/15/14 1317  TROPONINI 0.10* 0.06* 0.05*   Glucose  Recent Labs Lab 06/16/14 2340 06/17/14 0332 06/17/14 0759 06/17/14 1208 06/17/14 1624 06/17/14 2022  GLUCAP 171* 165* 128* 165* 164* 165*    Imaging Dg Chest Port 1 View  06/17/2014   CLINICAL DATA:  Respiratory failure, shortness of breath and cough.  EXAM: PORTABLE CHEST - 1 VIEW  COMPARISON:  06/15/2014  FINDINGS: Patchy edema pattern shows some improvement since  the prior chest x-ray. Diffuse bronchovascular prominence remains bilaterally. No focal airspace consolidation. The heart size is stable and at the upper limits of normal. No significant pleural fluid identified.  IMPRESSION: Improvement in edema pattern since the prior study.   Electronically Signed   By: Irish Lack M.D.   On: 06/17/2014 10:30     ASSESSMENT / PLAN:  PULMONARY A: Acute hypoxemic respiratory failure. > suspect mixed picture of COPD and CHF exacerbation; could be viral process causing both Suspect COPD, never had PFT Probable UA irritation > her wheeze is on inspiration Pulmonary Edema 2nd to A on C systolic CHF; no change CXR 1/23 P:   BiPAP prn, refused  1/23 pm Continue systemic steroids Continue scheduled bronchodilators/budesonide PRN levalbuterol Monitor in SDU   CARDIOVASCULAR A:  Acute on chronic systolic  (LVEF 15-20%) Hypertensive urgency H/o CAD Troponin elevation P:  Telemetry monitoring hydralazine scheduled,  + PRN hydralazine Continue IMDUR SBP goal < 160 mm/Hg Renal fxn may not permit further diuresis    RENAL A:   CKD  Hyperkalemia P:   Follow Bmet and UOP Replace electrolytes as indicated   GASTROINTESTINAL A:  GERD P:   Advance diet SUP: po PPI   HEMATOLOGIC A:   Microcytic anemia, chronic  P:  Follow CBC Enoxaparin for VTE ppx  INFECTIOUS A:   CXR reviewed, suspect pulm edema vs viral process  P:   Doxy 1/20 > 1/21 D/C Levaquin  Respiratory viral panel 1/22 >>  ENDOCRINE A:   DM  P:   Per [facility]  NEUROLOGIC A:   No acute issues H/o CVA  P:   Monitor   FAMILY  - Updates: None bedside  Today's summary> suspect this is mostly CHF, but agree could be some component of COPD exacerbation, possibly a viral process (metapneumo, rhinovirus) causing both CHF and COPD exacerbation.  Best to treat with steroids and lasix when her renal fxn will allow, hold Abx. Concerned that she may not improved if cardiorenal status will not allow diuretics. Will need to begin to address code status.    Levy Pupa, MD, PhD 06/18/2014, 10:16 AM [facility] [phone] or if no answer [phone]

## 2014-06-18 NOTE — Progress Notes (Addendum)
[facility] PROGRESS NOTE  Filed Weights   06/14/14 2321 06/16/14 0555  Weight: 84.4 kg (186 lb 1.1 oz) 82.7 kg (182 lb 5.1 oz)        Intake/Output Summary (Last 24 hours) at 06/18/14 6060 Last data filed at 06/18/14 0600  Gross per 24 hour  Intake   1750 ml  Output      0 ml  Net   1750 ml     Assessment/Plan: Acute respiratory failure with hypoxia due to COPD exacerbation - Last received antibiotics on 06/15/2014 including doxycycline and levofloxacin.  Levofloxacin discontinued by critical care service.  Continue steroids and inhalers. - Difficulty breathing on 06/15/2014 had to be moved to SDU.  Appreciate critical care input. - Continue flutter valve and add Chest physiotherapy. - Depending on patient's clinical course transition to telemetry later today.  DM (diabetes mellitus) type II controlled with renal manifestation with   Hypoglycemia: - CBG's have improved with insulin adjustment.  Blood glucose complicated by IV steroids. - A1c 9.0.  AKI on CKD (chronic kidney disease), stage III - Creatinine continues to worsen.  Check renal ultrasound.   - Reviewed patient's medication list, no obvious nephrotoxic agents found.. - Continue IV NS fluid, most likely due to decrease oral intake. - Hold diuretics.  Chronic diastolic heart failure now with new systolic congestive heart failure: - Seems to be euvolemic. - Appreciate cardiology evaluation.  Borderline elevated troponin - Likely due to stress from respiratory failure. - 2-D echocardiogram from 06/15/2014 reviewed, EF 15-20%, which is different from EF of 55% based on a 2-D echocardiogram on 05/25/2013. - Cardiology consulted this AM, appreciate their input.  Essential hypertension - Not well controlled, when necessary hydralazine.  Acute encephalopathy/altered mental status - Likely due to above illness.  Continue to monitor. - Patient received a dose of ativan during the night which may have  contributed to her altered mental status. Avoid all sedating medications if possible.   Code Status: Full Family Communication: no family at bedside Disposition Plan: If patient remains stable to transfer the patient out of stepdown; continue nebulizer, abx's and steroids. PT to see.   Consultants:  none  Procedures: ECHO: none  Antibiotics:  Levofloxacin and doxycycline on 06/15/2014.  HPI/Subjective: Patient was confused during the night and received ativan. Awake but cannot answer questions fully.  Objective: Filed Vitals:   06/18/14 0216 06/18/14 0350 06/18/14 0619 06/18/14 0801  BP:  172/75 213/96   Pulse:  83  75  Temp:  97.9 F (36.6 C)  97.7 F (36.5 C)  TempSrc:      Resp:  22  26  Height:      Weight:      SpO2: 99% 96%  100%     Exam:  Physical Exam: General: Awake, Oriented only to self, No acute distress. HEENT: EOMI. Neck: Supple CV: S1 and S2 Lungs: Inspiratory and expiratory wheezing bilaterally. Abdomen: Soft, Nontender, Nondistended, +bowel sounds. Ext: Good pulses. Trace edema.  Data Reviewed: Basic Metabolic Panel:  Recent Labs Lab 06/14/14 1701 06/15/14 0615 06/15/14 2000 06/16/14 0445 06/17/14 0335  NA 136 132* 132* 136 132*  K 3.9 4.3 5.1 5.6* 4.5  CL 105 100 101 107 102  CO2 19 20 18* 21 21  GLUCOSE 36* 242* 324* 150* 183*  BUN 25* 31* 43* 50* 69*  CREATININE 1.35* 1.52* 1.78* 1.88* 2.17*  CALCIUM 8.5 8.6 8.6 8.8 8.2*  MG  --  1.7  --   --   --  PHOS  --  5.1*  --   --   --    Liver Function Tests:  Recent Labs Lab 06/14/14 1701 06/15/14 0615  AST 24 31  ALT 13 15  ALKPHOS 151* 140*  BILITOT 0.6 0.6  PROT 8.0 7.9  ALBUMIN 3.6 3.5   No results for input(s): LIPASE, AMYLASE in the last 168 hours. No results for input(s): AMMONIA in the last 168 hours. CBC:  Recent Labs Lab 06/14/14 1701 06/15/14 0615 06/15/14 2000 06/16/14 0445  WBC 7.3 4.8 9.2 9.9  NEUTROABS 5.1  --  7.6  --   HGB 10.9* 10.1* 10.6*  10.6*  HCT 34.4* 32.7* 33.4* 34.1*  MCV 81.5 82.6 82.1 82.4  PLT 176 176 221 PLATELET CLUMPS NOTED ON SMEAR, COUNT APPEARS ADEQUATE   Cardiac Enzymes:  Recent Labs Lab 06/14/14 1701 06/15/14 0615 06/15/14 1317  TROPONINI 0.10* 0.06* 0.05*   BNP (last 3 results)  Recent Labs  10/14/13 1653  PROBNP 409.0   CBG:  Recent Labs Lab 06/17/14 0332 06/17/14 0759 06/17/14 1208 06/17/14 1624 06/17/14 2022  GLUCAP 165* 128* 165* 164* 165*    Recent Results (from the past 240 hour(s))  MRSA PCR Screening     Status: None   Collection Time: 06/14/14 11:27 PM  Result Value Ref Range Status   MRSA by PCR NEGATIVE NEGATIVE Final    Comment:        The GeneXpert MRSA Assay (FDA approved for NASAL specimens only), is one component of a comprehensive MRSA colonization surveillance program. It is not intended to diagnose MRSA infection nor to guide or monitor treatment for MRSA infections.   Culture, Urine     Status: None   Collection Time: 06/15/14  5:47 AM  Result Value Ref Range Status   Specimen Description URINE, CLEAN CATCH  Final   Special Requests Normal  Final   Colony Count   Final    50,000 COLONIES/ML Performed at [facility]    Culture   Final    Multiple bacterial morphotypes present, none predominant. Suggest appropriate recollection if clinically indicated. Performed at [facility]    Report Status 06/16/2014 FINAL  Final     Studies: Dg Chest Port 1 View  06/18/2014   CLINICAL DATA:  Acute respiratory failure  EXAM: PORTABLE CHEST - 1 VIEW  COMPARISON:  06/17/2014  FINDINGS: Cardiomegaly and aortic tortuosity is unchanged. Diffuse interstitial opacity is also stable, pulmonary edema versus is bronchitic change. No effusion or pneumothorax.  IMPRESSION: Unchanged congestive or bronchitic interstitial coarsening.   Electronically Signed   By: Tiburcio Pea M.D.   On: 06/18/2014 05:57   Dg Chest Port 1 View  06/17/2014   CLINICAL  DATA:  Respiratory failure, shortness of breath and cough.  EXAM: PORTABLE CHEST - 1 VIEW  COMPARISON:  06/15/2014  FINDINGS: Patchy edema pattern shows some improvement since the prior chest x-ray. Diffuse bronchovascular prominence remains bilaterally. No focal airspace consolidation. The heart size is stable and at the upper limits of normal. No significant pleural fluid identified.  IMPRESSION: Improvement in edema pattern since the prior study.   Electronically Signed   By: Irish Lack M.D.   On: 06/17/2014 10:30    Scheduled Meds: . aspirin  325 mg Oral Once  . bisoprolol  10 mg Oral Daily  . budesonide (PULMICORT) nebulizer solution  0.5 mg Nebulization BID  . docusate sodium  100 mg Oral BID  . donepezil  10 mg Oral QHS  .  enoxaparin (LOVENOX) injection  30 mg Subcutaneous QHS  . feeding supplement (ENSURE COMPLETE)  237 mL Oral BID BM  . fluticasone  2 spray Each Nare Daily  . hydrALAZINE  50 mg Oral 3 times per day  . insulin aspart  0-5 Units Subcutaneous QHS  . insulin aspart  0-9 Units Subcutaneous TID WC  . insulin aspart  3 Units Subcutaneous TID WC  . insulin detemir  5 Units Subcutaneous QHS  . ipratropium  0.5 mg Nebulization Q6H  . isosorbide mononitrate  60 mg Oral Daily  . ketorolac  1 drop Both Eyes QID  . latanoprost  1 drop Both Eyes QHS  . levalbuterol  1.25 mg Nebulization 4 times per day  . loratadine  10 mg Oral Daily  . methylPREDNISolone (SOLU-MEDROL) injection  60 mg Intravenous Q12H  . pantoprazole  40 mg Oral Daily  . prednisoLONE acetate  1 drop Both Eyes QID  . pregabalin  50 mg Oral TID   Continuous Infusions: . nitroGLYCERIN Stopped (06/15/14 2005)     Ireanna Finlayson A, MD [facility] 06/18/2014, 8:29 AM  LOS: 4 days    Addendum: Discussed with husband at bedside. Updated him of patient's condition including altered mental status likely caused by ativan. Code status discussed with him, he indicated that patient would have wished to be  DNR/DNI.  Dub Maclellan A, MD 06/18/2014, 1:01 PM

## 2014-06-18 NOTE — Progress Notes (Signed)
Pt more confused at times, pulling at equipment and lines. Pulled 2 IV's out. HR range from 90 to 56. Pulled foley out at end of day shift 1/23. Restarted IV's and replaced foley explained need to pt. Pt refused BiPap all night able to maintain sats at 100 on 2 L by [place]. ABG checked am

## 2014-06-18 NOTE — Consult Note (Signed)
Primary cardiologist: Dr. Marca Ancona Consulting cardiologist: Dr. Jonelle Sidle  Reason for consultation: Cardiomyopathy, respiratory distress  Clinical Summary Glenda Gonzalez is a medically complex 81 y.o.female admitted to the hospital on January 20 with worsening shortness of breath, managed initially for COPD exacerbation. She has intermittently required BiPAP support, has been on antibiotics and steroids, also initially IV Lasix. Pulmonary medicine has been assisting with her care most recently. She has a history of chronic diastolic heart failure, although echocardiogram done on January 21 reported significant reduction in LVEF the range of 15-20%, mild mitral regurgitation, PASP 46 L mercury. She is now on nasal cannula oxygen, although remains fairly tenuous. Complicating matters is also acute encephalopathy on top of reported history of dementia. She has a DO NOT RESUSCITATE status at this time. We were consulted today to assist with her management. I spoke with her husband in the room.  She does not report provide any history at this time. Her husband states that she has been having a progressive cough over the last few months. ECG shows old left bundle branch block pattern. She has had a minor increase in troponin I up to 0.10, likely more consistent with demand ischemia/CHF than true ACS.  I spoke with nursing, patient reportedly not taking by mouth medications today, due to concerns about her altered mental status and ability to swallow.   Allergies  Allergen Reactions  . Ace Inhibitors Shortness Of Breath and Other (See Comments)    Angioedema   . Codeine Other (See Comments)    hallucination  . Fentanyl Other (See Comments)    Abnormal behavior per husband  . Other Other (See Comments)    Beef and turkey-causes gout flareup.  Patient stated "[place] makes me real sick".  . Morphine Itching  . Penicillins Itching    Medications Scheduled Medications: . aspirin  325  mg Oral Once  . bisoprolol  10 mg Oral Daily  . budesonide (PULMICORT) nebulizer solution  0.5 mg Nebulization BID  . docusate sodium  100 mg Oral BID  . donepezil  10 mg Oral QHS  . enoxaparin (LOVENOX) injection  30 mg Subcutaneous QHS  . feeding supplement (ENSURE COMPLETE)  237 mL Oral BID BM  . fluticasone  2 spray Each Nare Daily  . hydrALAZINE  50 mg Oral 3 times per day  . insulin aspart  0-5 Units Subcutaneous QHS  . insulin aspart  0-9 Units Subcutaneous TID WC  . insulin aspart  3 Units Subcutaneous TID WC  . insulin detemir  5 Units Subcutaneous QHS  . ipratropium  0.5 mg Nebulization Q6H  . isosorbide mononitrate  60 mg Oral Daily  . ketorolac  1 drop Both Eyes QID  . latanoprost  1 drop Both Eyes QHS  . levalbuterol  1.25 mg Nebulization 4 times per day  . loratadine  10 mg Oral Daily  . methylPREDNISolone (SOLU-MEDROL) injection  60 mg Intravenous Q12H  . pantoprazole  40 mg Oral Daily  . prednisoLONE acetate  1 drop Both Eyes QID  . pregabalin  50 mg Oral TID    Infusions: . sodium chloride 75 mL/hr at 06/18/14 0845  . nitroGLYCERIN Stopped (06/15/14 2005)    PRN Medications: acetaminophen **OR** acetaminophen, guaiFENesin, hydrALAZINE, levalbuterol, LORazepam, ondansetron **OR** ondansetron (ZOFRAN) IV   Past Medical History  Diagnosis Date  . GERD (gastroesophageal reflux disease)   . Essential hypertension   . Diverticulitis   . Gout   . Essential and other  specified forms of tremor   . Dementia   . CAD (coronary artery disease)     Cardiac catheterization 2002: 60-70% D1 stenosis and other luminal irregularities; negative Myoview 2010  . Anemia, chronic disease   . Left arm pain   . Chronic diastolic heart failure     LVEF 55% as iof 2014  . History of pneumonia   . Seizures     In setting of stroke  . Arthritis   . Type 2 diabetes mellitus   . Stroke 2014    Past Surgical History  Procedure Laterality Date  . Dilation and curettage of  uterus    . Tumor removal      Abdomen  . Abdominal hysterectomy    . Cataract extraction w/phaco Left 03/30/2013    Procedure: CATARACT EXTRACTION PHACO AND INTRAOCULAR LENS PLACEMENT (IOC);  Surgeon: Shade Flood, MD;  Location: [facility] OR;  Service: Ophthalmology;  Laterality: Left;  . Appendectomy    . Colonoscopy    . Fracture surgery Left     Foot and ankle  . Pars plana vitrectomy Left 12/28/2013    Procedure: PARS PLANA VITRECTOMY WITH 23 GAUGE LEFT EYE ;  Surgeon: Shade Flood, MD;  Location: [facility] OR;  Service: Ophthalmology;  Laterality: Left;    Family History  Problem Relation Age of Onset  . Colon cancer Neg Hx   . Heart disease Father   . Heart disease Brother     Social History Ms. Robeck reports that she quit smoking about 34 years ago. Her smoking use included Cigarettes. She has a 25 pack-year smoking history. She has never used smokeless tobacco. Ms. Grennan reports that she does not drink alcohol.  Review of Systems Unable to assess at this point due to altered mental status.  Physical Examination Blood pressure 163/114, pulse 88, temperature 98.4 F (36.9 C), temperature source Core (Comment), resp. rate 21, height 5' 1.5" (1.562 m), weight 182 lb 5.1 oz (82.7 kg), SpO2 100 %.  Intake/Output Summary (Last 24 hours) at 06/18/14 1713 Last data filed at 06/18/14 1400  Gross per 24 hour  Intake 1368.75 ml  Output    240 ml  Net 1128.75 ml   Telemetry: Sinus rhythm/sinus tachycardia.  Obese, chronically ill-appearing woman, hand mits in place due to confusion. HEENT: Conjunctiva and lids normal, oropharynx clear. Neck: Supple, elevated JVP, no carotid bruits, no thyromegaly. Lungs: Prolonged expiratory phase with wheezes, scattered crackles. Cardiac: Regular rate and rhythm, soft S3, no significant systolic murmur, no pericardial rub. Abdomen: Soft, nontender, bowel sounds present, no guarding or rebound. Extremities: 1+ edema, distal pulses 1-2+. Skin: Warm and  dry. Musculoskeletal: No kyphosis. Neuropsychiatric: Alert and oriented x1, does not provide any history now.   Lab Results  Basic Metabolic Panel:  Recent Labs Lab 06/14/14 1701 06/15/14 0615 06/15/14 2000 06/16/14 0445 06/17/14 0335  NA 136 132* 132* 136 132*  K 3.9 4.3 5.1 5.6* 4.5  CL 105 100 101 107 102  CO2 19 20 18* 21 21  GLUCOSE 36* 242* 324* 150* 183*  BUN 25* 31* 43* 50* 69*  CREATININE 1.35* 1.52* 1.78* 1.88* 2.17*  CALCIUM 8.5 8.6 8.6 8.8 8.2*  MG  --  1.7  --   --   --   PHOS  --  5.1*  --   --   --     Liver Function Tests:  Recent Labs Lab 06/14/14 1701 06/15/14 0615  AST 24 31  ALT 13 15  ALKPHOS 151*  140*  BILITOT 0.6 0.6  PROT 8.0 7.9  ALBUMIN 3.6 3.5    CBC:  Recent Labs Lab 06/14/14 1701 06/15/14 0615 06/15/14 2000 06/16/14 0445  WBC 7.3 4.8 9.2 9.9  NEUTROABS 5.1  --  7.6  --   HGB 10.9* 10.1* 10.6* 10.6*  HCT 34.4* 32.7* 33.4* 34.1*  MCV 81.5 82.6 82.1 82.4  PLT 176 176 221 PLATELET CLUMPS NOTED ON SMEAR, COUNT APPEARS ADEQUATE    Cardiac Enzymes:  Recent Labs Lab 06/14/14 1701 06/15/14 0615 06/15/14 1317  TROPONINI 0.10* 0.06* 0.05*    BNP: 463  ECG Sinus rhythm with left bundle-branch block (old).  Imaging EXAM: PORTABLE CHEST - 1 VIEW  COMPARISON: 06/17/2014  FINDINGS: Cardiomegaly and aortic tortuosity is unchanged. Diffuse interstitial opacity is also stable, pulmonary edema versus is bronchitic change. No effusion or pneumothorax.  IMPRESSION: Unchanged congestive or bronchitic interstitial coarsening.   Echocardiogram 06/15/14: Study Conclusions  - Left ventricle: The cavity size was normal. Wall thickness was normal. The estimated ejection fraction was in the range of 15% to 20%. - Mitral valve: There was mild regurgitation. - Left atrium: The atrium was mildly dilated. - Right ventricle: Systolic function was mildly reduced. - Right atrium: The atrium was mildly dilated. -  Pulmonary arteries: PA peak pressure: 46 mm Hg (S).   Impression  1. Hypoxic respiratory failure, possibly combination of COPD and acute systolic heart failure. She has been treated with antibiotics and steroids, also intake greater than output by 3 L in the last 48 hours. Diuretics were stopped due to progressive renal insufficiency. Echocardiogram from January 21st shows LVEF 15-20% at this point, a major change from her prior assessment. Cardiac markers are not consistent with a clear-cut ACS, likely more reflective of demand ischemia/CHF. Left bundle branch block is old.  2. Cardiomyopathy, LVEF 15-20% by echocardiogram. Ischemic versus nonischemic etiology uncertain at this point, nor is chronicity. It could be that she developed a nonischemic cardiomyopathy during the time that her cough has been progressive with the last few months secondary to increased filling pressures and fluid overload.  3. History of CAD documented 2002, mainly branch vessel at that time, negative Myoview noted from 2010.  4. Acute renal failure, creatinine up to 2.1.  5. Elevated hypertension in the setting of above problems.  6. Altered mental status, likely encephalopathy, possibly also medication related, and history includes dementia at baseline.   Recommendations  Recommend assessment of swallowing status. If patient is not able to take her medications orally, they will need to be converted to IV formulations. Focus should be on more optimal blood pressure control, would tend to use hydralazine and nitrate at this time. Also resume Lasix and follow renal function and urine output carefully to further optimize volume status and filling pressures. Does not look to be a good candidate for invasive cardiac strategies at this time. Overall prognosis guarded at best.  Jonelle Sidle, M.D., F.A.C.C.

## 2014-06-19 DIAGNOSIS — Z515 Encounter for palliative care: Secondary | ICD-10-CM | POA: Insufficient documentation

## 2014-06-19 DIAGNOSIS — R0602 Shortness of breath: Secondary | ICD-10-CM

## 2014-06-19 DIAGNOSIS — R0600 Dyspnea, unspecified: Secondary | ICD-10-CM

## 2014-06-19 DIAGNOSIS — R410 Disorientation, unspecified: Secondary | ICD-10-CM

## 2014-06-19 LAB — GLUCOSE, CAPILLARY
GLUCOSE-CAPILLARY: 174 mg/dL — AB (ref 70–99)
GLUCOSE-CAPILLARY: 141 mg/dL — AB (ref 70–99)
GLUCOSE-CAPILLARY: 147 mg/dL — AB (ref 70–99)
GLUCOSE-CAPILLARY: 118 mg/dL — AB (ref 70–99)
GLUCOSE-CAPILLARY: 258 mg/dL — AB (ref 70–99)
Glucose-Capillary: 124 mg/dL — ABNORMAL HIGH (ref 70–99)
Glucose-Capillary: 198 mg/dL — ABNORMAL HIGH (ref 70–99)

## 2014-06-19 LAB — BASIC METABOLIC PANEL
ANION GAP: 15 (ref 5–15)
BUN: 62 mg/dL — AB (ref 6–23)
CALCIUM: 9 mg/dL (ref 8.4–10.5)
CO2: 18 mmol/L — ABNORMAL LOW (ref 19–32)
CREATININE: 1.72 mg/dL — AB (ref 0.50–1.10)
Chloride: 108 mmol/L (ref 96–112)
GFR calc non Af Amer: 27 mL/min — ABNORMAL LOW (ref >90)
GFR, EST AFRICAN AMERICAN: 32 mL/min — AB (ref >90)
Glucose, Bld: 147 mg/dL — ABNORMAL HIGH (ref 70–99)
Potassium: 3.8 mmol/L (ref 3.5–5.1)
Sodium: 141 mmol/L (ref 135–145)

## 2014-06-19 LAB — CBC
HCT: 31.8 % — ABNORMAL LOW (ref 36.0–46.0)
HEMOGLOBIN: 9.8 g/dL — AB (ref 12.0–15.0)
MCH: 25.3 pg — AB (ref 26.0–34.0)
MCHC: 30.8 g/dL (ref 30.0–36.0)
MCV: 82.2 fL (ref 78.0–100.0)
PLATELETS: 236 10*3/uL (ref 150–400)
RBC: 3.87 MIL/uL (ref 3.87–5.11)
RDW: 18.7 % — AB (ref 11.5–15.5)
WBC: 6.6 10*3/uL (ref 4.0–10.5)

## 2014-06-19 MED ORDER — HALOPERIDOL LACTATE 5 MG/ML IJ SOLN
2.0000 mg | Freq: Four times a day (QID) | INTRAMUSCULAR | Status: DC | PRN
  Administered 2014-06-19 (×2): 2 mg via INTRAMUSCULAR
  Filled 2014-06-19 (×2): qty 1

## 2014-06-19 MED ORDER — PREGABALIN 25 MG PO CAPS: 25.0000 mg | ORAL_CAPSULE | Freq: Three times a day (TID) | ORAL | Status: DC

## 2014-06-19 MED ORDER — METHYLPREDNISOLONE SODIUM SUCC 40 MG IJ SOLR
40.0000 mg | INTRAMUSCULAR | Status: DC
  Administered 2014-06-20: 40 mg via INTRAVENOUS
  Filled 2014-06-19: qty 1

## 2014-06-19 MED ORDER — HALOPERIDOL 2 MG PO TABS
2.0000 mg | ORAL_TABLET | Freq: Three times a day (TID) | ORAL | Status: DC | PRN
  Filled 2014-06-19: qty 1

## 2014-06-19 NOTE — Progress Notes (Signed)
Pharmacist Heart Failure Core Measure Documentation  Assessment: Glenda Gonzalez has an EF documented as 15-20% on 06/15/2014 by ECHO.  Rationale: Heart failure patients with left ventricular systolic dysfunction (LVSD) and an EF < 40% should be prescribed an angiotensin converting enzyme inhibitor (ACEI) or angiotensin receptor blocker (ARB) at discharge unless a contraindication is documented in the medical record.  This patient is not currently on an ACEI or ARB for HF.  This note is being placed in the record in order to provide documentation that a contraindication to the use of these agents is present for this encounter.  ACE Inhibitor or Angiotensin Receptor Blocker is contraindicated (specify all that apply)  []   ACEI allergy AND ARB allergy (angioedema to ACE-I) [x]   Angioedema []   Moderate or severe aortic stenosis []   Hyperkalemia []   Hypotension []   Renal artery stenosis [x]   Worsening renal function, preexisting renal disease or dysfunction   Dannielle Huh 06/19/2014 10:38 AM

## 2014-06-19 NOTE — Progress Notes (Signed)
CARE MANAGEMENT NOTE 06/19/2014  Patient:  Glenda Gonzalez, Glenda Gonzalez   Account Number:  [id number]  Date Initiated:  06/15/2014  Documentation initiated by:  Ferdinand Cava  Subjective/Objective Assessment:   81 yo female admitted with COPD exacerbation from home     Action/Plan:   discharge planning   Anticipated DC Date:  06/22/2014   Anticipated DC Plan:  HOME/SELF CARE      DC Planning Services  CM consult      Choice offered to / List presented to:             Status of service:   Medicare Important Message given?  YES (If response is "NO", the following Medicare IM given date fields will be blank) Date Medicare IM given:  06/16/2014 Medicare IM given by:  [facility] Date Additional Medicare IM given:   Additional Medicare IM given by:    Discharge Disposition:    Per UR Regulation:  Reviewed for med. necessity/level of care/duration of stay  If discussed at Long Length of Stay Meetings, dates discussed:    Comments:  [id number]/Adelyne Marchese,RN,BSN,CCM: Patient transferred out of icu on [id number] and back due to decompensation and placed on bipap now is also DNR. Remains on bipap.  06/15/14 Ferdinand Cava RN BSn CM [phone] Patient stated that she lives at home with her husband and 2 sons. She has a walker, cane, and BSC. She stated she has a PCP and pharmacy and her spouse drives her to appointments. Will continue to follow for dc needs

## 2014-06-19 NOTE — Progress Notes (Signed)
PT Cancellation Note  Patient Details Name: Glenda Gonzalez MRN: [id number] DOB: 14-Aug-1933   Cancelled Treatment:    Reason Eval/Treat Not Completed: Medical issues which prohibited therapy (decline in status. will follow for [facility] care consult.)   Rada Hay 06/19/2014, 10:45 AM Blanchard Kelch PT [phone]

## 2014-06-19 NOTE — Consult Note (Signed)
Patient FY:[id number] Glenda Gonzalez      DOB: 1933-11-08      ENI:[id number]     Consult Note from the Palliative Medicine Team at [place] Requested by: Dr Daleen Bo     PCP: Glenda Hey, MD Reason for Consultation:? Hospice Care    Phone Number:[phone]  Assessment/Recommendations: 81 yo female with COPD, systolic HF, dementia who presented with hypoxic resp failure suspected 2/2 decompensated CHF vs COPD.  Prolonged delirium, AKI also complicating stay  1.  Code Status: DNR  2. GOC: Met with husband and daughter today. They are struggling to see [name] like this.  She ahd been living at home with them and they feel she does better at home than in hospital or nursing facilites.  She was at SNF at one point, but they took her home and feel cognitively/functionally she does better.  She has had dementia for ~70yr they guess and acknowledge that her cognitive function has worsened over the past months to years.  There goal is to get her back home and are firm in that she will not go to nursing facility from here.  They talked extensively with Dr BDaleen Botoday as well as other physicians and are aware that her prognosis seems poor. Family estimates that her prognosis may be less than a month if not improving. They would like to see how she does over next 24-48h and make decisions from there.  She has worsened over the past few days per their report. We talked about hospice care as a means of getting her home and focusing on comfort, managing comfort there so she did not have to return to hospital. They would be interested in pursuing this if no improvement.    3. Symptom Management:   1. Acute Delirium- multifactorial. Would avoid benzo's as much as able and preferentially use haldol. With AKI, I will decrease dose of lyrica though it appears she has not taken in over 24h.    4. Psychosocial/Spiritual: married more than 50 years, 5 children multiple grandchildren. Lives at home  with husband and support from children. Has periodic confusion and difficulty ambulating but able to converse with family at baseline.    Brief HPI: 81yo female with PMHx of HTN, CHF, COPD, CAD, Dementia who was admitted on 1/20 with several month history of productive cough, and progressively worsening dyspnea, cough, sputum production, wheezing at home.  On admission she was felt to be in COPD exacerbatin vs CHF.  Had worsening hypoxic resp failure here requiring BiPAP for a time.  Also felt to have decompensated HF and AKI which has been treated with diuretics.  She has had prolonged encephalopathy in the hospital and worsened functional/cognitve ability. Multiple physicians have discussed potentially poor prognosis and palliative care was consulted today.    ROS: Unable to obtain 2/2 encephalopathy    PMH:  Past Medical History  Diagnosis Date  . GERD (gastroesophageal reflux disease)   . Essential hypertension   . Diverticulitis   . Gout   . Essential and other specified forms of tremor   . Dementia   . CAD (coronary artery disease)     Cardiac catheterization 2002: 60-70% D1 stenosis and other luminal irregularities; negative Myoview 2010  . Anemia, chronic disease   . Left arm pain   . Chronic diastolic heart failure     LVEF 55% as iof 2014  . History of pneumonia   . Seizures     In setting  of stroke  . Arthritis   . Type 2 diabetes mellitus   . Stroke 2014     PSH: Past Surgical History  Procedure Laterality Date  . Dilation and curettage of uterus    . Tumor removal      Abdomen  . Abdominal hysterectomy    . Cataract extraction w/phaco Left 03/30/2013    Procedure: CATARACT EXTRACTION PHACO AND INTRAOCULAR LENS PLACEMENT (IOC);  Surgeon: Adonis Brook, MD;  Location: [place];  Service: Ophthalmology;  Laterality: Left;  . Appendectomy    . Colonoscopy    . Fracture surgery Left     Foot and ankle  . Pars plana vitrectomy Left 12/28/2013    Procedure: PARS PLANA  VITRECTOMY WITH 23 GAUGE LEFT EYE ;  Surgeon: Adonis Brook, MD;  Location: [place];  Service: Ophthalmology;  Laterality: Left;   I have reviewed the [place] and SH and  If appropriate update it with new information. Allergies  Allergen Reactions  . Ace Inhibitors Shortness Of Breath and Other (See Comments)    Angioedema   . Codeine Other (See Comments)    hallucination  . Fentanyl Other (See Comments)    Abnormal behavior per husband  . Other Other (See Comments)    Beef and turkey-causes gout flareup.  Patient stated "[place] makes me real sick".  . Morphine Itching  . Penicillins Itching   Scheduled Meds: . aspirin  325 mg Oral Once  . bisoprolol  10 mg Oral Daily  . budesonide (PULMICORT) nebulizer solution  0.5 mg Nebulization BID  . docusate sodium  100 mg Oral BID  . donepezil  10 mg Oral QHS  . enoxaparin (LOVENOX) injection  30 mg Subcutaneous QHS  . feeding supplement (ENSURE COMPLETE)  237 mL Oral BID BM  . fluticasone  2 spray Each Nare Daily  . furosemide  40 mg Intravenous Daily  . hydrALAZINE  50 mg Oral 3 times per day  . insulin aspart  0-5 Units Subcutaneous QHS  . insulin aspart  0-9 Units Subcutaneous TID WC  . insulin aspart  3 Units Subcutaneous TID WC  . insulin detemir  5 Units Subcutaneous QHS  . ipratropium  0.5 mg Nebulization Q6H  . isosorbide mononitrate  60 mg Oral Daily  . ketorolac  1 drop Both Eyes QID  . latanoprost  1 drop Both Eyes QHS  . levalbuterol  1.25 mg Nebulization 4 times per day  . loratadine  10 mg Oral Daily  . [START ON 06/20/2014] methylPREDNISolone (SOLU-MEDROL) injection  40 mg Intravenous Q24H  . pantoprazole  40 mg Oral Daily  . prednisoLONE acetate  1 drop Both Eyes QID  . pregabalin  50 mg Oral TID   Continuous Infusions: . nitroGLYCERIN Stopped (06/15/14 2005)   PRN Meds:.acetaminophen **OR** acetaminophen, guaiFENesin, haloperidol **OR** haloperidol lactate, hydrALAZINE, levalbuterol, LORazepam, ondansetron **OR**  ondansetron (ZOFRAN) IV    BP 210/74 mmHg  Pulse 105  Temp(Src) 98.8 F (37.1 C) (Core (Comment))  Resp 22  Ht 5' 1.5" (1.562 m)  Wt 79.4 kg (175 lb 0.7 oz)  BMI 32.54 kg/m2  SpO2 98%   PPS: 20   Intake/Output Summary (Last 24 hours) at 06/19/14 1537 Last data filed at 06/19/14 1300  Gross per 24 hour  Intake   1280 ml  Output   4550 ml  Net  -3270 ml    Physical Exam:  General: Confused, NAD HEENT: [place], sclera anciteric Chest:  CTAb CVS: tachy Abdomen:soft, ND Ext: edema Neuro  nonverbal, does not follow commands  Labs: CBC    Component Value Date/Time   WBC 6.6 06/19/2014 0410   RBC 3.87 06/19/2014 0410   RBC 3.98 07/21/2011 0429   HGB 9.8* 06/19/2014 0410   HCT 31.8* 06/19/2014 0410   PLT 236 06/19/2014 0410   MCV 82.2 06/19/2014 0410   MCH 25.3* 06/19/2014 0410   MCHC 30.8 06/19/2014 0410   RDW 18.7* 06/19/2014 0410   LYMPHSABS 0.9 06/15/2014 2000   MONOABS 0.7 06/15/2014 2000   EOSABS 0.0 06/15/2014 2000   BASOSABS 0.0 06/15/2014 2000    BMET    Component Value Date/Time   NA 141 06/19/2014 0410   K 3.8 06/19/2014 0410   CL 108 06/19/2014 0410   CO2 18* 06/19/2014 0410   GLUCOSE 147* 06/19/2014 0410   BUN 62* 06/19/2014 0410   CREATININE 1.72* 06/19/2014 0410   CALCIUM 9.0 06/19/2014 0410   GFRNONAA 27* 06/19/2014 0410   GFRAA 32* 06/19/2014 0410    CMP     Component Value Date/Time   NA 141 06/19/2014 0410   K 3.8 06/19/2014 0410   CL 108 06/19/2014 0410   CO2 18* 06/19/2014 0410   GLUCOSE 147* 06/19/2014 0410   BUN 62* 06/19/2014 0410   CREATININE 1.72* 06/19/2014 0410   CALCIUM 9.0 06/19/2014 0410   PROT 7.9 06/15/2014 0615   ALBUMIN 3.5 06/15/2014 0615   AST 31 06/15/2014 0615   ALT 15 06/15/2014 0615   ALKPHOS 140* 06/15/2014 0615   BILITOT 0.6 06/15/2014 0615   GFRNONAA 27* 06/19/2014 0410   GFRAA 32* 06/19/2014 0410    1/20 CXR IMPRESSION: Significant bronchitic changes without definite infiltrate.  1/21 2D  Echo Study Conclusions  - Left ventricle: The cavity size was normal. Wall thickness was normal. The estimated ejection fraction was in the range of 15% to 20%. - Mitral valve: There was mild regurgitation. - Left atrium: The atrium was mildly dilated. - Right ventricle: Systolic function was mildly reduced. - Right atrium: The atrium was mildly dilated. - Pulmonary arteries: PA peak pressure: 46 mm Hg (S).  1/24 CXR IMPRESSION: Unchanged congestive or bronchitic interstitial coarsening.   1/24 Renal US IMPRESSION: 1. Bilateral echogenic kidneys without evidence for hydronephrosis.   Total Time: 75 minutes Greater than 50%  of this time was spent counseling and coordinating care related to the above assessment and plan.  Doran Clay D.O. Palliative Medicine Team at [facility]  Pager: [phone] Team Phone: [phone]

## 2014-06-19 NOTE — Progress Notes (Signed)
Pt has been very restless during the night. [facility] made aware.

## 2014-06-19 NOTE — Progress Notes (Signed)
[facility] PROGRESS NOTE  AVO VOCI MMN:[id number] DOB: February 18, 1934 DOA: 06/14/2014 PCP: Billee Cashing, MD  Assessment/Plan: 81 y/o female with PMH of HTN, CAD, CHF (EF 15%), CKD, DM, COPD, Dementia presented with cough, SOB, DOE -admitted to SDU due to respiratory failure   1. Acute hypoxic respiratory failure likely due to CHF and also COPD exacerbation - unlikely due to bacterial infection per pulmonology eval; Last received antibiotics on 06/15/2014 including doxycycline and levofloxacin. Levofloxacin discontinued by critical care service. Continue steroids/taper and inhalers. - renal function worsened on lasix IV; Cr improved on IVF with lasix on hold, but chest is more congested today; will resume lasix; hold IVF 2. DM (diabetes mellitus) type II controlled with renal manifestation with Hypoglycemia on admission: - CBG's have improved with insulin adjustment. Blood glucose complicated by IV steroids. - A1c 9.0. 3. AKI on CKD (chronic kidney disease), stage III; initially creatinine worsened on diuretics;  -but then improved on IVF; will resume lasix due to CHF; close monitor  4. Chronic diastolic heart failure now with new systolic congestive heart failure: - echo: LVEF 15-20%, RV, LV failure; + MR, pulmonary HTN  - resume lasix 1/25'  Monitor I/O, daily weight; monitor renal function 5. Borderline elevated troponin ? Demand ischemia; cont ASA, BB; cardiology signed off  6. Essential hypertension Not well controlled, when necessary hydralazine. 7. Acute encephalopathy/altered mental status on top of dementia; per her family she has long memory issues; CT (09/2013): ischemia, atrophy  - Avoid all sedating medications benzo, benadryl if possible. Supportive care; fall precautions  D/w her family at the bedside, her daughter grandchildren; prognosis remains guarded with advanced CHF, ongoing multiorgan dysfunction -patient is DNR; will ask palliative care  consultation, comfort care   Code Status: DNR Family Communication: d/w patient, famiyl (indicate person spoken with, relationship, and if by phone, the number) Disposition Plan: pend palliative care    Consultants:  Palliative care  Cardiology  Pulmonology    Procedures:  none  Antibiotics:  Levofloxacin and doxycycline on 06/15/2014. (indicate start date, and stop date if known)  HPI/Subjective: Does not follow commands, agitated  Objective: Filed Vitals:   06/19/14 0847  BP: 147/53  Pulse: 79  Temp: 98.6 F (37 C)  Resp: 21    Intake/Output Summary (Last 24 hours) at 06/19/14 1106 Last data filed at 06/19/14 0800  Gross per 24 hour  Intake   1580 ml  Output   3840 ml  Net  -2260 ml   Filed Weights   06/14/14 2321 06/16/14 0555 06/19/14 0650  Weight: 84.4 kg (186 lb 1.1 oz) 82.7 kg (182 lb 5.1 oz) 79.4 kg (175 lb 0.7 oz)    Exam:   General:  Agitated, does not follow commands   Cardiovascular: s1,s2 rrr  Respiratory: BL crackles   Abdomen: soft, nt,dn   Musculoskeletal: mild edema pedal    Data Reviewed: Basic Metabolic Panel:  Recent Labs Lab 06/15/14 0615 06/15/14 2000 06/16/14 0445 06/17/14 0335 06/19/14 0410  NA 132* 132* 136 132* 141  K 4.3 5.1 5.6* 4.5 3.8  CL 100 101 107 102 108  CO2 20 18* 21 21 18*  GLUCOSE 242* 324* 150* 183* 147*  BUN 31* 43* 50* 69* 62*  CREATININE 1.52* 1.78* 1.88* 2.17* 1.72*  CALCIUM 8.6 8.6 8.8 8.2* 9.0  MG 1.7  --   --   --   --   PHOS 5.1*  --   --   --   --  Liver Function Tests:  Recent Labs Lab 06/14/14 1701 06/15/14 0615  AST 24 31  ALT 13 15  ALKPHOS 151* 140*  BILITOT 0.6 0.6  PROT 8.0 7.9  ALBUMIN 3.6 3.5   No results for input(s): LIPASE, AMYLASE in the last 168 hours. No results for input(s): AMMONIA in the last 168 hours. CBC:  Recent Labs Lab 06/14/14 1701 06/15/14 0615 06/15/14 2000 06/16/14 0445 06/19/14 0410  WBC 7.3 4.8 9.2 9.9 6.6  NEUTROABS 5.1  --  7.6   --   --   HGB 10.9* 10.1* 10.6* 10.6* 9.8*  HCT 34.4* 32.7* 33.4* 34.1* 31.8*  MCV 81.5 82.6 82.1 82.4 82.2  PLT 176 176 221 PLATELET CLUMPS NOTED ON SMEAR, COUNT APPEARS ADEQUATE 236   Cardiac Enzymes:  Recent Labs Lab 06/14/14 1701 06/15/14 0615 06/15/14 1317  TROPONINI 0.10* 0.06* 0.05*   BNP (last 3 results)  Recent Labs  10/14/13 1653  PROBNP 409.0   CBG:  Recent Labs Lab 06/18/14 0815 06/18/14 1226 06/18/14 1607 06/18/14 2258 06/19/14 0801  GLUCAP 124* 174* 141* 147* 118*    Recent Results (from the past 240 hour(s))  MRSA PCR Screening     Status: None   Collection Time: 06/14/14 11:27 PM  Result Value Ref Range Status   MRSA by PCR NEGATIVE NEGATIVE Final    Comment:        The GeneXpert MRSA Assay (FDA approved for NASAL specimens only), is one component of a comprehensive MRSA colonization surveillance program. It is not intended to diagnose MRSA infection nor to guide or monitor treatment for MRSA infections.   Culture, Urine     Status: None   Collection Time: 06/15/14  5:47 AM  Result Value Ref Range Status   Specimen Description URINE, CLEAN CATCH  Final   Special Requests Normal  Final   Colony Count   Final    50,000 COLONIES/ML Performed at [facility]    Culture   Final    Multiple bacterial morphotypes present, none predominant. Suggest appropriate recollection if clinically indicated. Performed at [facility]    Report Status 06/16/2014 FINAL  Final     Studies: US Renal Port  06/18/2014   CLINICAL DATA:  Acute renal failure  EXAM: RENAL/URINARY TRACT ULTRASOUND COMPLETE  COMPARISON:  07/09/2012  FINDINGS: Right Kidney:  Length: 10.0. Increased parenchymal echogenicity. No mass or hydronephrosis visualized.  Left Kidney:  Length: 8 cm. Increased parenchymal echogenicity. No mass or hydronephrosis visualized.  Bladder:  Collapsed around a Foley catheter balloon.  IMPRESSION: 1. Bilateral echogenic kidneys without  evidence for hydronephrosis.   Electronically Signed   By: Signa Kell M.D.   On: 06/18/2014 11:43   Dg Chest Port 1 View  06/18/2014   CLINICAL DATA:  Acute respiratory failure  EXAM: PORTABLE CHEST - 1 VIEW  COMPARISON:  06/17/2014  FINDINGS: Cardiomegaly and aortic tortuosity is unchanged. Diffuse interstitial opacity is also stable, pulmonary edema versus is bronchitic change. No effusion or pneumothorax.  IMPRESSION: Unchanged congestive or bronchitic interstitial coarsening.   Electronically Signed   By: Tiburcio Pea M.D.   On: 06/18/2014 05:57    Scheduled Meds: . aspirin  325 mg Oral Once  . bisoprolol  10 mg Oral Daily  . budesonide (PULMICORT) nebulizer solution  0.5 mg Nebulization BID  . docusate sodium  100 mg Oral BID  . donepezil  10 mg Oral QHS  . enoxaparin (LOVENOX) injection  30 mg Subcutaneous QHS  . feeding  supplement (ENSURE COMPLETE)  237 mL Oral BID BM  . fluticasone  2 spray Each Nare Daily  . furosemide  40 mg Intravenous Daily  . hydrALAZINE  50 mg Oral 3 times per day  . insulin aspart  0-5 Units Subcutaneous QHS  . insulin aspart  0-9 Units Subcutaneous TID WC  . insulin aspart  3 Units Subcutaneous TID WC  . insulin detemir  5 Units Subcutaneous QHS  . ipratropium  0.5 mg Nebulization Q6H  . isosorbide mononitrate  60 mg Oral Daily  . ketorolac  1 drop Both Eyes QID  . latanoprost  1 drop Both Eyes QHS  . levalbuterol  1.25 mg Nebulization 4 times per day  . loratadine  10 mg Oral Daily  . [START ON 06/20/2014] methylPREDNISolone (SOLU-MEDROL) injection  40 mg Intravenous Q24H  . pantoprazole  40 mg Oral Daily  . prednisoLONE acetate  1 drop Both Eyes QID  . pregabalin  50 mg Oral TID   Continuous Infusions: . nitroGLYCERIN Stopped (06/15/14 2005)    Active Problems:   Essential hypertension   Acute respiratory failure with hypoxia   CHF (congestive heart failure)   CKD (chronic kidney disease), stage III   Diastolic congestive heart  failure   COPD exacerbation   Hypoglycemia   Elevated troponin   DM (diabetes mellitus) type II controlled with renal manifestation   Acute renal failure   Acute respiratory failure    Time spent: >35 minutes     Esperanza Sheets  [facility] Pager [phone]. If 7PM-7AM, please contact night-coverage at [email or web address], password [facility] 06/19/2014, 11:06 AM  LOS: 5 days

## 2014-06-19 NOTE — Progress Notes (Signed)
     Please see consult note from yesterday evening.  Nothing more at this time to add. Family has now made DNR. Family is currently gathered at bedside.  Continue to treat HTN as best as possible. Not able to swallow meds.  IV lasix  Will sign off. Please let us know if we can be of further assistance.   Prognosis guarded. Consider palliative care consult for goals of care.   Donato Schultz, MD

## 2014-06-19 NOTE — Progress Notes (Signed)
PULMONARY / CRITICAL CARE MEDICINE   Name: Glenda Gonzalez MRN: [id number] DOB: Aug 22, 1933    ADMISSION DATE:  06/14/2014 CONSULTATION DATE:  06/15/2014  REFERRING MD :  Dr. Gwenlyn Perking  CHIEF COMPLAINT:  SOB  INITIAL PRESENTATION: 81 year old female admitted to [facility] 1/20 for presumed COPD exacerbation. Initially improved and transferred out of SDU. 1/21 became acutely dyspneic, hypertensive, and tachycardic. CXR with B interstitial infiltrates. BiPAP started. PCCM consulted.  STUDIES:  1/21 ECHO >> LVEF 15-20%, Mild MR  SIGNIFICANT EVENTS: 1/20  Admit to ICU 1/21  Transfer to floor, decompensated, back to ICU, BiPAP.  1/24  Family discussion per Dr. Betti Cruz, pt DNR 1/25  On [place] O2, no distress  SUBJECTIVE:  RN reports no acute events.  Pt slow to answer but denies acute complaints.   VITAL SIGNS: Temp:  [97.9 F (36.6 C)-99 F (37.2 C)] 99 F (37.2 C) (01/25 0809) Pulse Rate:  [29-95] 88 (01/25 0809) Resp:  [16-32] 19 (01/25 0809) BP: (163-194)/(46-114) 173/62 mmHg (01/25 0809) SpO2:  [90 %-100 %] 99 % (01/25 0809) Weight:  [175 lb 0.7 oz (79.4 kg)] 175 lb 0.7 oz (79.4 kg) (01/25 0650)    INTAKE / OUTPUT:  Intake/Output Summary (Last 24 hours) at 06/19/14 0928 Last data filed at 06/19/14 0800  Gross per 24 hour  Intake   1730 ml  Output   3840 ml  Net  -2110 ml    PHYSICAL EXAMINATION: General:  Resting comfortably in bed, NAD HEENT: NCAT EOMi PULM: even/non-labored on 2L, lungs bilaterally with mild wheeze, louder in upper airway.  No distress. CV: Irreg irreg, no mgr AB: BS+, soft, nontender Ext: 1+ edema in ankles bilaterally Neuro: Awake, slow to answer questions, moves all ext, some resting tremor  LABS:  CBC  Recent Labs Lab 06/15/14 2000 06/16/14 0445 06/19/14 0410  WBC 9.2 9.9 6.6  HGB 10.6* 10.6* 9.8*  HCT 33.4* 34.1* 31.8*  PLT 221 PLATELET CLUMPS NOTED ON SMEAR, COUNT APPEARS ADEQUATE 236   BMET  Recent Labs Lab 06/16/14 0445  06/17/14 0335 06/19/14 0410  NA 136 132* 141  K 5.6* 4.5 3.8  CL 107 102 108  CO2 21 21 18*  BUN 50* 69* 62*  CREATININE 1.88* 2.17* 1.72*  GLUCOSE 150* 183* 147*   Electrolytes  Recent Labs Lab 06/15/14 0615  06/16/14 0445 06/17/14 0335 06/19/14 0410  CALCIUM 8.6  < > 8.8 8.2* 9.0  MG 1.7  --   --   --   --   PHOS 5.1*  --   --   --   --   < > = values in this interval not displayed.   Sepsis Markers  Recent Labs Lab 06/15/14 2031 06/15/14 2032 06/16/14 0445 06/17/14 0335  LATICACIDVEN 2.7*  --   --   --   PROCALCITON  --  0.26 0.95 1.07   ABG  Recent Labs Lab 06/15/14 1745 06/15/14 2050 06/18/14 0534  PHART 7.283* 7.343* 7.345*  PCO2ART 37.4 34.5* 32.0*  PO2ART 67.9* 87.1 122.0*   Liver Enzymes  Recent Labs Lab 06/14/14 1701 06/15/14 0615  AST 24 31  ALT 13 15  ALKPHOS 151* 140*  BILITOT 0.6 0.6  ALBUMIN 3.6 3.5   Cardiac Enzymes  Recent Labs Lab 06/14/14 1701 06/15/14 0615 06/15/14 1317  TROPONINI 0.10* 0.06* 0.05*   Glucose  Recent Labs Lab 06/17/14 2022 06/18/14 0815 06/18/14 1226 06/18/14 1607 06/18/14 2258 06/19/14 0801  GLUCAP 165* 124* 174* 141* 147* 118*  Imaging US Renal Port  06/18/2014   CLINICAL DATA:  Acute renal failure  EXAM: RENAL/URINARY TRACT ULTRASOUND COMPLETE  COMPARISON:  07/09/2012  FINDINGS: Right Kidney:  Length: 10.0. Increased parenchymal echogenicity. No mass or hydronephrosis visualized.  Left Kidney:  Length: 8 cm. Increased parenchymal echogenicity. No mass or hydronephrosis visualized.  Bladder:  Collapsed around a Foley catheter balloon.  IMPRESSION: 1. Bilateral echogenic kidneys without evidence for hydronephrosis.   Electronically Signed   By: Signa Kell M.D.   On: 06/18/2014 11:43   Dg Chest Port 1 View  06/18/2014   CLINICAL DATA:  Acute respiratory failure  EXAM: PORTABLE CHEST - 1 VIEW  COMPARISON:  06/17/2014  FINDINGS: Cardiomegaly and aortic tortuosity is unchanged. Diffuse  interstitial opacity is also stable, pulmonary edema versus is bronchitic change. No effusion or pneumothorax.  IMPRESSION: Unchanged congestive or bronchitic interstitial coarsening.   Electronically Signed   By: Tiburcio Pea M.D.   On: 06/18/2014 05:57     ASSESSMENT / PLAN:  PULMONARY A: Acute hypoxemic respiratory failure - suspect mixed picture of COPD and CHF exacerbation; could be viral process causing both Suspect COPD, never had PFT Probable UA irritation > her wheeze is on inspiration Pulmonary Edema - secondary to A on C systolic CHF P:   BiPAP PRN for increased WOB, would not use if mental status declined Continue systemic steroids, reduced to 40 mg QD 1/25 with slow taper  Continue scheduled bronchodilators / budesonide PRN levalbuterol, atrovent Monitor in SDU Aspiration precautions Diuresis as renal function / BP permit   CARDIOVASCULAR A:  Acute on chronic systolic  (LVEF 15-20%) Hypertensive urgency H/o CAD Troponin elevation P:  Telemetry monitoring BP control, Hydralazine, Imdur  + PRN hydralazine SBP goal < 160 mm/Hg   RENAL A:   CKD  Hyperkalemia P:   Follow BMP and UOP Replace electrolytes as indicated   GASTROINTESTINAL A:  GERD Concern for swallowing difficulties P:   Advance diet SLP eval, defer to primary svc SUP:  PPI   HEMATOLOGIC A:   Microcytic anemia, chronic P:  Follow CBC Enoxaparin for VTE ppx  INFECTIOUS A:   Bilateral Airspace Disease - suspect pulmonary edema vs viral process.  Likely edema given hypertension, EF.   P:   Doxy 1/20 >> 1/21 Levaquin 1/21 >> 1/22  Respiratory viral panel 1/22 >>  ENDOCRINE A:   DM P:   Per [facility]  NEUROLOGIC A:   No acute issues H/o CVA Dementia P:   Monitor   FAMILY  - Updates: No family available on am rounds 1/25.    Appears comfortable 1/25, no distress.  Continue diuresis as renal fxn / BP permit.  Does not appear to be infectious process (no fever or WBC).   Patient is DNR.  Consider goals of care given multiple co-morbidities.  PCCM will be available PRN.  Please call back if new needs arise.     Canary Brim, NP-C [facility] Pgr: [phone] or [phone]  Agree with above  Care during the described time interval was provided by me and/or other providers on the critical care team.  I have reviewed this patient's available data, including medical history, events of note, physical examination and test results as part of my evaluation   Oretha Milch MD

## 2014-06-20 DIAGNOSIS — E1165 Type 2 diabetes mellitus with hyperglycemia: Secondary | ICD-10-CM

## 2014-06-20 LAB — BASIC METABOLIC PANEL
ANION GAP: 14 (ref 5–15)
BUN: 64 mg/dL — ABNORMAL HIGH (ref 6–23)
CO2: 21 mmol/L (ref 19–32)
CREATININE: 1.9 mg/dL — AB (ref 0.50–1.10)
Calcium: 9.3 mg/dL (ref 8.4–10.5)
Chloride: 109 mmol/L (ref 96–112)
GFR calc Af Amer: 28 mL/min — ABNORMAL LOW (ref >90)
GFR, EST NON AFRICAN AMERICAN: 24 mL/min — AB (ref >90)
Glucose, Bld: 248 mg/dL — ABNORMAL HIGH (ref 70–99)
Potassium: 3.4 mmol/L — ABNORMAL LOW (ref 3.5–5.1)
Sodium: 144 mmol/L (ref 135–145)

## 2014-06-20 LAB — GLUCOSE, CAPILLARY
Glucose-Capillary: 179 mg/dL — ABNORMAL HIGH (ref 70–99)
Glucose-Capillary: 257 mg/dL — ABNORMAL HIGH (ref 70–99)
Glucose-Capillary: 248 mg/dL — ABNORMAL HIGH (ref 70–99)
Glucose-Capillary: 165 mg/dL — ABNORMAL HIGH (ref 70–99)

## 2014-06-20 MED ORDER — CETYLPYRIDINIUM CHLORIDE 0.05 % MT LIQD
7.0000 mL | Freq: Two times a day (BID) | OROMUCOSAL | Status: DC
  Administered 2014-06-20 – 2014-06-22 (×5): 7 mL via OROMUCOSAL

## 2014-06-20 MED ORDER — DEXAMETHASONE 1 MG/ML PO CONC
2.0000 mg | Freq: Every day | ORAL | Status: DC
  Filled 2014-06-20 (×2): qty 2

## 2014-06-20 MED ORDER — CHLORHEXIDINE GLUCONATE 0.12 % MT SOLN
15.0000 mL | Freq: Two times a day (BID) | OROMUCOSAL | Status: DC
  Administered 2014-06-20 – 2014-06-22 (×4): 15 mL via OROMUCOSAL
  Filled 2014-06-20 (×6): qty 15

## 2014-06-20 MED ORDER — MORPHINE SULFATE 2 MG/ML IJ SOLN: 1.0000 mg | INTRAMUSCULAR | Status: DC | PRN

## 2014-06-20 NOTE — Progress Notes (Signed)
Around 0430 heart monitor alarmed V-tach multiple times. Patient has been in first degree heart block with a bundle branch block that looks like V-tach.  The rhythm that was alarming looked different from the previous rhythm but not like V-tach.  After these episodes the patients HR dropped from 100s to 70s-80s. A twelve lead EKG was done which showed a left branch bundle block. Will continue to monitor.

## 2014-06-20 NOTE — Progress Notes (Signed)
Inpatient Diabetes Program Recommendations  AACE/ADA: New Consensus Statement on Inpatient Glycemic Control (2013)  Target Ranges:  Prepandial:   less than 140 mg/dL      Peak postprandial:   less than 180 mg/dL (1-2 hours)      Critically ill patients:  140 - 180 mg/dL   Reason for Visit: Hyperglycemia  Results for SAQUOIA, Glenda Gonzalez (MRN [id number]) as of 06/20/2014 11:30  Ref. Range 06/20/2014 04:00  Glucose Latest Range: 70-99 mg/dL 650 (H)   Inpatient Diabetes Program Recommendations Insulin - Basal: Increase Levemir to 10 units QHS  Note: Will continue to follow. Thank you. Ailene Ards, RD, LDN, CDE Inpatient Diabetes Coordinator [phone]

## 2014-06-20 NOTE — Progress Notes (Signed)
Patient Glenda Gonzalez      DOB: 01-19-1934      VBT:[id number]   Palliative Medicine Team at [facility] Progress Note    Subjective: Remains minimally responsive. Does not appear to be big change from yesterday mentally     Filed Vitals:   06/20/14 1100  BP: 106/73  Pulse: 88  Temp: 99 F (37.2 C)  Resp: 18   Physical exam: GEN: alert, mninimally responsive CV: regular rate   CBC    Component Value Date/Time   WBC 6.6 06/19/2014 0410   RBC 3.87 06/19/2014 0410   RBC 3.98 07/21/2011 0429   HGB 9.8* 06/19/2014 0410   HCT 31.8* 06/19/2014 0410   PLT 236 06/19/2014 0410   MCV 82.2 06/19/2014 0410   MCH 25.3* 06/19/2014 0410   MCHC 30.8 06/19/2014 0410   RDW 18.7* 06/19/2014 0410   LYMPHSABS 0.9 06/15/2014 2000   MONOABS 0.7 06/15/2014 2000   EOSABS 0.0 06/15/2014 2000   BASOSABS 0.0 06/15/2014 2000    CMP     Component Value Date/Time   NA 144 06/20/2014 0400   K 3.4* 06/20/2014 0400   CL 109 06/20/2014 0400   CO2 21 06/20/2014 0400   GLUCOSE 248* 06/20/2014 0400   BUN 64* 06/20/2014 0400   CREATININE 1.90* 06/20/2014 0400   CALCIUM 9.3 06/20/2014 0400   PROT 7.9 06/15/2014 0615   ALBUMIN 3.5 06/15/2014 0615   AST 31 06/15/2014 0615   ALT 15 06/15/2014 0615   ALKPHOS 140* 06/15/2014 0615   BILITOT 0.6 06/15/2014 0615   GFRNONAA 24* 06/20/2014 0400   GFRAA 28* 06/20/2014 0400      Assessment and plan: 81 yo female with COPD, systolic HF, dementia who presented with hypoxic resp failure suspected 2/2 decompensated CHF vs COPD. Prolonged delirium, AKI also complicating stay  1. Code Status: DNR  2. GOC: Spoke with husband and Dr Benjamine Mola today.  Really no significant change in overall status. Renal function a little worse today.  Husband agrees that it is time to take her home.  I have placed case manager referral for home hospice which is family destination of choice and they have ability to provide around the clock care for her at home.     At time of d/c would simplify med list. Recommend considering discontinuation of following: ASA aricept lovenox methylpred change to dexamethasone  lyrica (not sure this is really helping and with worsening renal function could cause problems)  3. Symptom Management:  1. Acute Delirium- multifactorial and persists. Cont current management  4. Psychosocial/Spiritual: married more than 50 years, 5 children multiple grandchildren. Lives at home with husband and support from children. Has periodic confusion and difficulty ambulating but able to converse with family at baseline.    Orvis Brill D.O. Palliative Medicine Team at [facility]  Pager: [phone] Team Phone: [phone]

## 2014-06-20 NOTE — Progress Notes (Signed)
PT Cancellation Note  Patient Details Name: Glenda Gonzalez MRN: [id number] DOB: 06-Jul-1933   Cancelled Treatment:    Reason Eval/Treat Not Completed: Medical issues which prohibited therapy (chart reviewed, noted Palliative Care involved. Does not appear  that patient is stable/nor able to participate in mobility. will follow along for medical improvement and resume PT.Marland Kitchen )   Rada Hay 06/20/2014, 7:55 AM Blanchard Kelch PT [phone]

## 2014-06-20 NOTE — Progress Notes (Signed)
[facility] PROGRESS NOTE  Glenda Gonzalez UOH:[id number] DOB: 05/19/1934 DOA: 06/14/2014 PCP: Billee Cashing, MD  Assessment/Plan: 81 y/o female with PMH of HTN, CAD, CHF (EF 15%), CKD, DM, COPD, Dementia presented with cough, SOB, DOE -admitted to SDU due to respiratory failure   Acute hypoxic respiratory failure likely due to CHF and also COPD exacerbation - unlikely due to bacterial infection per pulmonology eval; Last received antibiotics on 06/15/2014 including doxycycline and levofloxacin. Levofloxacin discontinued by critical care service. Continue steroids/taper and inhalers. - renal function worsened on lasix IV; Cr improved on IVF with lasix on hold, but chest is more congested today; will resume lasix; hold IVF  DM (diabetes mellitus) type II controlled with renal manifestation with Hypoglycemia on admission: - CBG's have improved with insulin adjustment. Blood glucose complicated by IV steroids. - A1c 9.0.   AKI on CKD (chronic kidney disease), stage III;  creatinine worsening on diuretics but with IVF, seemed more congested close monitor   Chronic diastolic heart failure now with new systolic congestive heart failure: - echo: LVEF 15-20%, RV, LV failure; + MR, pulmonary HTN  - resume lasix 1/25  Monitor I/O, daily weight; monitor renal function  Borderline elevated troponin ? Demand ischemia; cont ASA, BB; cardiology signed off    Essential hypertension Not well controlled, when necessary hydralazine.  Acute encephalopathy/altered mental status on top of dementia; per her family she has long memory issues; CT (09/2013): ischemia, atrophy  - Avoid all sedating medications benzo, benadryl if possible. Supportive care; fall precautions   palliative care consultation  poor prognosis- does not seem to be improving   Code Status: DNR Family Communication:  Disposition Plan:    Consultants:  Palliative care  Cardiology  Pulmonology     Procedures:  none  Antibiotics:  Levofloxacin and doxycycline on 06/15/2014. (indicate start date, and stop date if known)  HPI/Subjective: minimally responsive  Objective: Filed Vitals:   06/20/14 0700  BP: 201/95  Pulse: 110  Temp: 98.2 F (36.8 C)  Resp: 27    Intake/Output Summary (Last 24 hours) at 06/20/14 0738 Last data filed at 06/20/14 0600  Gross per 24 hour  Intake     75 ml  Output   4175 ml  Net  -4100 ml   Filed Weights   06/14/14 2321 06/16/14 0555 06/19/14 0650  Weight: 84.4 kg (186 lb 1.1 oz) 82.7 kg (182 lb 5.1 oz) 79.4 kg (175 lb 0.7 oz)    Exam:   General:  Opens eyes to voice but does not speak   Cardiovascular: s1,s2 rrr  Respiratory: coarse breath sounds   Abdomen: soft, nt,dn   Musculoskeletal: mild edema pedal    Data Reviewed: Basic Metabolic Panel:  Recent Labs Lab 06/15/14 0615 06/15/14 2000 06/16/14 0445 06/17/14 0335 06/19/14 0410 06/20/14 0400  NA 132* 132* 136 132* 141 144  K 4.3 5.1 5.6* 4.5 3.8 3.4*  CL 100 101 107 102 108 109  CO2 20 18* 21 21 18* 21  GLUCOSE 242* 324* 150* 183* 147* 248*  BUN 31* 43* 50* 69* 62* 64*  CREATININE 1.52* 1.78* 1.88* 2.17* 1.72* 1.90*  CALCIUM 8.6 8.6 8.8 8.2* 9.0 9.3  MG 1.7  --   --   --   --   --   PHOS 5.1*  --   --   --   --   --    Liver Function Tests:  Recent Labs Lab 06/14/14 1701 06/15/14 0615  AST 24 31  ALT 13 15  ALKPHOS 151* 140*  BILITOT 0.6 0.6  PROT 8.0 7.9  ALBUMIN 3.6 3.5   No results for input(s): LIPASE, AMYLASE in the last 168 hours. No results for input(s): AMMONIA in the last 168 hours. CBC:  Recent Labs Lab 06/14/14 1701 06/15/14 0615 06/15/14 2000 06/16/14 0445 06/19/14 0410  WBC 7.3 4.8 9.2 9.9 6.6  NEUTROABS 5.1  --  7.6  --   --   HGB 10.9* 10.1* 10.6* 10.6* 9.8*  HCT 34.4* 32.7* 33.4* 34.1* 31.8*  MCV 81.5 82.6 82.1 82.4 82.2  PLT 176 176 221 PLATELET CLUMPS NOTED ON SMEAR, COUNT APPEARS ADEQUATE 236   Cardiac  Enzymes:  Recent Labs Lab 06/14/14 1701 06/15/14 0615 06/15/14 1317  TROPONINI 0.10* 0.06* 0.05*   BNP (last 3 results)  Recent Labs  10/14/13 1653  PROBNP 409.0   CBG:  Recent Labs Lab 06/18/14 1607 06/18/14 2258 06/19/14 0801 06/19/14 1301 06/19/14 1717  GLUCAP 141* 147* 118* 198* 258*    Recent Results (from the past 240 hour(s))  MRSA PCR Screening     Status: None   Collection Time: 06/14/14 11:27 PM  Result Value Ref Range Status   MRSA by PCR NEGATIVE NEGATIVE Final    Comment:        The GeneXpert MRSA Assay (FDA approved for NASAL specimens only), is one component of a comprehensive MRSA colonization surveillance program. It is not intended to diagnose MRSA infection nor to guide or monitor treatment for MRSA infections.   Culture, Urine     Status: None   Collection Time: 06/15/14  5:47 AM  Result Value Ref Range Status   Specimen Description URINE, CLEAN CATCH  Final   Special Requests Normal  Final   Colony Count   Final    50,000 COLONIES/ML Performed at [facility]    Culture   Final    Multiple bacterial morphotypes present, none predominant. Suggest appropriate recollection if clinically indicated. Performed at [facility]    Report Status 06/16/2014 FINAL  Final     Studies: US Renal Port  06/18/2014   CLINICAL DATA:  Acute renal failure  EXAM: RENAL/URINARY TRACT ULTRASOUND COMPLETE  COMPARISON:  07/09/2012  FINDINGS: Right Kidney:  Length: 10.0. Increased parenchymal echogenicity. No mass or hydronephrosis visualized.  Left Kidney:  Length: 8 cm. Increased parenchymal echogenicity. No mass or hydronephrosis visualized.  Bladder:  Collapsed around a Foley catheter balloon.  IMPRESSION: 1. Bilateral echogenic kidneys without evidence for hydronephrosis.   Electronically Signed   By: Signa Kell M.D.   On: 06/18/2014 11:43    Scheduled Meds: . antiseptic oral rinse  7 mL Mouth Rinse q12n4p  . aspirin  325 mg Oral  Once  . bisoprolol  10 mg Oral Daily  . budesonide (PULMICORT) nebulizer solution  0.5 mg Nebulization BID  . chlorhexidine  15 mL Mouth Rinse BID  . docusate sodium  100 mg Oral BID  . donepezil  10 mg Oral QHS  . enoxaparin (LOVENOX) injection  30 mg Subcutaneous QHS  . feeding supplement (ENSURE COMPLETE)  237 mL Oral BID BM  . fluticasone  2 spray Each Nare Daily  . furosemide  40 mg Intravenous Daily  . hydrALAZINE  50 mg Oral 3 times per day  . insulin aspart  0-5 Units Subcutaneous QHS  . insulin aspart  0-9 Units Subcutaneous TID WC  . insulin aspart  3 Units Subcutaneous TID WC  . insulin detemir  5  Units Subcutaneous QHS  . ipratropium  0.5 mg Nebulization Q6H  . isosorbide mononitrate  60 mg Oral Daily  . ketorolac  1 drop Both Eyes QID  . latanoprost  1 drop Both Eyes QHS  . levalbuterol  1.25 mg Nebulization 4 times per day  . loratadine  10 mg Oral Daily  . methylPREDNISolone (SOLU-MEDROL) injection  40 mg Intravenous Q24H  . pantoprazole  40 mg Oral Daily  . prednisoLONE acetate  1 drop Both Eyes QID  . pregabalin  25 mg Oral TID   Continuous Infusions: . nitroGLYCERIN Stopped (06/15/14 2005)    Active Problems:   Essential hypertension   Acute respiratory failure with hypoxia   CHF (congestive heart failure)   CKD (chronic kidney disease), stage III   Diastolic congestive heart failure   COPD exacerbation   Hypoglycemia   Elevated troponin   DM (diabetes mellitus) type II controlled with renal manifestation   Acute renal failure   Acute respiratory failure   Palliative care encounter   Acute delirium    Time spent: 35 minutes     Artemisia Auvil  [facility] Pager [phone] If 7PM-7AM, please contact night-coverage at [email or web address], password [facility] 06/20/2014, 7:38 AM  LOS: 6 days

## 2014-06-20 NOTE — Progress Notes (Signed)
Pt with core tem 38.0. Temp has been trending upwards throughout the course of the day. Paged On Call to report trend and discuss options for treatment. Has tylenol ordered po but is on NPO status, also has ordered rectal but pt is incont and will likely stimulate stool before able to absorb. Questioned trying tylenol IV. Will give dose rectal if no IV ordered received.  Will cont to monitor. ~ Marrion Coy, RN

## 2014-06-21 LAB — RESPIRATORY VIRUS PANEL
Adenovirus: NEGATIVE
INFLUENZA A: NEGATIVE
INFLUENZA B 1: NEGATIVE
METAPNEUMOVIRUS: NEGATIVE
PARAINFLUENZA 2 A: NEGATIVE
Parainfluenza 1: NEGATIVE
Parainfluenza 3: NEGATIVE
RESPIRATORY SYNCYTIAL VIRUS A: POSITIVE — AB
RESPIRATORY SYNCYTIAL VIRUS B: NEGATIVE
Rhinovirus: NEGATIVE

## 2014-06-21 LAB — CREATININE, SERUM
Creatinine, Ser: 2.52 mg/dL — ABNORMAL HIGH (ref 0.50–1.10)
GFR calc Af Amer: 20 mL/min — ABNORMAL LOW (ref >90)
GFR calc non Af Amer: 17 mL/min — ABNORMAL LOW (ref >90)

## 2014-06-21 MED ORDER — HYDRALAZINE HCL 20 MG/ML IJ SOLN: 5.0000 mg | INTRAMUSCULAR | Status: DC | PRN

## 2014-06-21 MED ORDER — HYDROMORPHONE HCL 1 MG/ML PO LIQD: 1.0000 mg | ORAL | Status: DC | PRN

## 2014-06-21 NOTE — Progress Notes (Addendum)
[facility] PROGRESS NOTE  Glenda Gonzalez ZOX:[id number] DOB: 1934/03/11 DOA: 06/14/2014 PCP: Billee Cashing, MD   HPI/Subjective: Non-responsive  Assessment/Plan: 81 y/o female with PMH of HTN, CAD, CHF (EF 15%), CKD, DM, COPD, Dementia presented with cough, SOB, DOE -admitted to SDU due to respiratory failure   Acute hypoxic respiratory failure- RSV A -  COPD exacerbation and possible CHF exacerbation -  RSV A positive on resp panel -  Last received antibiotics on 06/15/2014 including doxycycline and levofloxacin. Levofloxacin discontinued by critical care service.  - Continue steroids taper - will d/c Decadron today - suspecting pulm edema? Placed back on Lasix yesterday with noted bump in Cr today - now comfort care- all meds being given via IV as she is not alert enough to take POs  DM (diabetes mellitus) type II controlled with renal manifestation with Hypoglycemia on admission: - CBG's have improved with insulin adjustment. Blood glucose complicated by IV steroids. - A1c 9.0.   AKI on CKD (chronic kidney disease), stage III;   creatinine worsening on diuretics- stop diuretics  Chronic diastolic heart failure now with new systolic congestive heart failure: - echo: LVEF 15-20%, RV, LV failure; + MR, pulmonary HTN  - hold Lasix due to rising Cr  Borderline elevated troponin ? Demand ischemia; cont ASA, BB; cardiology signed off    Essential hypertension  - Pt now hypotensive-   Acute encephalopathy/altered mental status on top of dementia; per her family she has long memory issues; CT (09/2013): ischemia, atrophy  - Avoid all sedating medications benzo, benadryl if possible. Supportive care; fall precautions   palliative care consultation  poor prognosis- does not seem to be improving-    Code Status: DNR Family Communication: with husband toda Disposition Plan: home with hospice vs hospice home   Consultants:  Palliative  care  Cardiology  Pulmonology    Procedures:  none  Antibiotics:  Levofloxacin and doxycycline on 06/15/2014. (indicate start date, and stop date if known)    Objective: Filed Vitals:   06/21/14 1300  BP: 125/59  Pulse: 109  Temp: 99.5 F (37.5 C)  Resp: 27    Intake/Output Summary (Last 24 hours) at 06/21/14 1428 Last data filed at 06/21/14 0540  Gross per 24 hour  Intake      0 ml  Output    325 ml  Net   -325 ml   Filed Weights   06/14/14 2321 06/16/14 0555 06/19/14 0650  Weight: 84.4 kg (186 lb 1.1 oz) 82.7 kg (182 lb 5.1 oz) 79.4 kg (175 lb 0.7 oz)    Exam:   General:  Not opening eyes today  Cardiovascular: s1,s2 rrr  Respiratory: coarse breath sounds   Abdomen: soft, nt,dn   Musculoskeletal: mild edema pedal    Data Reviewed: Basic Metabolic Panel:  Recent Labs Lab 06/15/14 0615 06/15/14 2000 06/16/14 0445 06/17/14 0335 06/19/14 0410 06/20/14 0400 06/21/14 0358  NA 132* 132* 136 132* 141 144  --   K 4.3 5.1 5.6* 4.5 3.8 3.4*  --   CL 100 101 107 102 108 109  --   CO2 20 18* 21 21 18* 21  --   GLUCOSE 242* 324* 150* 183* 147* 248*  --   BUN 31* 43* 50* 69* 62* 64*  --   CREATININE 1.52* 1.78* 1.88* 2.17* 1.72* 1.90* 2.52*  CALCIUM 8.6 8.6 8.8 8.2* 9.0 9.3  --   MG 1.7  --   --   --   --   --   --  PHOS 5.1*  --   --   --   --   --   --    Liver Function Tests:  Recent Labs Lab 06/14/14 1701 06/15/14 0615  AST 24 31  ALT 13 15  ALKPHOS 151* 140*  BILITOT 0.6 0.6  PROT 8.0 7.9  ALBUMIN 3.6 3.5   No results for input(s): LIPASE, AMYLASE in the last 168 hours. No results for input(s): AMMONIA in the last 168 hours. CBC:  Recent Labs Lab 06/14/14 1701 06/15/14 0615 06/15/14 2000 06/16/14 0445 06/19/14 0410  WBC 7.3 4.8 9.2 9.9 6.6  NEUTROABS 5.1  --  7.6  --   --   HGB 10.9* 10.1* 10.6* 10.6* 9.8*  HCT 34.4* 32.7* 33.4* 34.1* 31.8*  MCV 81.5 82.6 82.1 82.4 82.2  PLT 176 176 221 PLATELET CLUMPS NOTED ON SMEAR,  COUNT APPEARS ADEQUATE 236   Cardiac Enzymes:  Recent Labs Lab 06/14/14 1701 06/15/14 0615 06/15/14 1317  TROPONINI 0.10* 0.06* 0.05*   BNP (last 3 results)  Recent Labs  10/14/13 1653  PROBNP 409.0   CBG:  Recent Labs Lab 06/19/14 1717 06/19/14 2329 06/20/14 0904 06/20/14 1157 06/20/14 1616  GLUCAP 258* 179* 257* 248* 165*    Recent Results (from the past 240 hour(s))  MRSA PCR Screening     Status: None   Collection Time: 06/14/14 11:27 PM  Result Value Ref Range Status   MRSA by PCR NEGATIVE NEGATIVE Final    Comment:        The GeneXpert MRSA Assay (FDA approved for NASAL specimens only), is one component of a comprehensive MRSA colonization surveillance program. It is not intended to diagnose MRSA infection nor to guide or monitor treatment for MRSA infections.   Culture, Urine     Status: None   Collection Time: 06/15/14  5:47 AM  Result Value Ref Range Status   Specimen Description URINE, CLEAN CATCH  Final   Special Requests Normal  Final   Colony Count   Final    50,000 COLONIES/ML Performed at [facility]    Culture   Final    Multiple bacterial morphotypes present, none predominant. Suggest appropriate recollection if clinically indicated. Performed at [facility]    Report Status 06/16/2014 FINAL  Final  Respiratory virus panel (routine influenza)     Status: Abnormal   Collection Time: 06/16/14 11:22 AM  Result Value Ref Range Status   Respiratory Syncytial Virus A Positive (A) Negative Final   Respiratory Syncytial Virus B Negative Negative Final   Influenza A Negative Negative Final   Influenza B Negative Negative Final   Parainfluenza 1 Negative Negative Final   Parainfluenza 2 Negative Negative Final   Parainfluenza 3 Negative Negative Final   Metapneumovirus Negative Negative Final   Rhinovirus Negative Negative Final   Adenovirus Negative Negative Final    Comment: (NOTE) Performed At: [facility] [address] [place], [place] [id number] Mila Homer MD RF:[id number]      Studies: No results found.  Scheduled Meds: . antiseptic oral rinse  7 mL Mouth Rinse q12n4p  . aspirin  325 mg Oral Once  . bisoprolol  10 mg Oral Daily  . budesonide (PULMICORT) nebulizer solution  0.5 mg Nebulization BID  . chlorhexidine  15 mL Mouth Rinse BID  . dexamethasone  2 mg Oral Daily  . docusate sodium  100 mg Oral BID  . enoxaparin (LOVENOX) injection  30 mg Subcutaneous QHS  . feeding supplement (ENSURE  COMPLETE)  237 mL Oral BID BM  . fluticasone  2 spray Each Nare Daily  . furosemide  40 mg Intravenous Daily  . hydrALAZINE  50 mg Oral 3 times per day  . ipratropium  0.5 mg Nebulization Q6H  . isosorbide mononitrate  60 mg Oral Daily  . ketorolac  1 drop Both Eyes QID  . latanoprost  1 drop Both Eyes QHS  . levalbuterol  1.25 mg Nebulization 4 times per day  . loratadine  10 mg Oral Daily  . pantoprazole  40 mg Oral Daily  . prednisoLONE acetate  1 drop Both Eyes QID   Continuous Infusions: . nitroGLYCERIN Stopped (06/15/14 2005)       Time spent: 35 minutes     Marbin Olshefski  [facility]  If 7PM-7AM, please contact night-coverage at [email or web address], password [facility] 06/21/2014, 2:28 PM  LOS: 7 days

## 2014-06-21 NOTE — Progress Notes (Signed)
Notified by Kathleen Lime, patient and family request services of [facility]) after discharge.  Patient information reviewed with Dr Elliot Gurney, [facility] Medical Director hospice eligibility confirmed.  Spoke briefly on the phone with husband Ethelene Browns and separately with son Renae Fickle to initiate education related to hospice services, philosophy and team approach to care; they voiced good understanding of information provided.  Husband shared that their home had a bedbug problem and he has had this treated- however does not feel that the problem is corrected as he feels there was still bugs present; he wanted this information known to the care providers and the DME agency. Writer confirmed with Research scientist (physical sciences) Jewel Kizzie Bane that [facility] will deliver the needed DME and discussed with [facility] Clinical management who confirmed [facility] has an agency policy which addresses this issue and providers will wear protective equipment while we service the patient's needs.  Discussed above with family and hospital primary team; currently plan is to move forward with possible discharge home tomorrow by [facility] once arrangements are in place. As pt currently taking no PO medications; would ask PMT to consider recommendations for comfort medications at discharge (of note local pharmacy does not have liquid Dilaudid). Writer will follow-up in the morning with primary team and family      DME needs discussed with husband and call placed to [facility] who has contacted [facility] to request Complete Pkg D: fully electric hospital bed with AP&P mattress, full rails and over-bed table and Complete O2 Pkg B with Neb Machine and oral suction set-up -pt currently on O2 @ 3LNC continuous; receiving scheduled Neb Tx [facility] to call husband Ethelene Browns at c: [phone] or h: [phone] to arrange delivery time  Initial paperwork faxed to [facility]  * Completed d/c summary will need to be  faxed to [facility] @ [phone] when final Please notify [facility] when patient is ready to leave unit at d/c call [phone] (or [phone] if after 5 pm);   Above information shared with [facility] Please call with any questions or concerns   Valente David, RN 06/21/2014, 4:27 PM [facility] RN Liaison [phone]

## 2014-06-22 MED ORDER — MORPHINE SULFATE (CONCENTRATE) 10 MG/0.5ML PO SOLN: 20.0000 mg | ORAL | Status: DC | PRN

## 2014-06-22 MED ORDER — LORAZEPAM 2 MG/ML PO CONC
1.0000 mg | ORAL | Status: DC | PRN
  Administered 2014-06-22: 1 mg via ORAL
  Filled 2014-06-22: qty 1

## 2014-06-22 MED ORDER — HALOPERIDOL LACTATE 2 MG/ML PO CONC
1.0000 mg | ORAL | Status: DC | PRN
  Filled 2014-06-22: qty 0.5

## 2014-06-22 MED ORDER — LORAZEPAM 2 MG/ML PO CONC: 1.0000 mg | ORAL | Status: AC | PRN

## 2014-06-22 MED ORDER — LORAZEPAM 2 MG/ML PO CONC: 1.0000 mg | ORAL | Status: DC | PRN

## 2014-06-22 MED ORDER — MORPHINE SULFATE (CONCENTRATE) 10 MG/0.5ML PO SOLN: 20.0000 mg | ORAL | Status: AC | PRN

## 2014-06-22 NOTE — Care Management Note (Signed)
Medicare Important Message given?  YES (If response is "NO", the following Medicare IM given date fields will be blank) Date Medicare IM given:  06/16/2014 Medicare IM given by:  [facility] Date Additional Medicare IM given:  06/22/2014 Additional Medicare IM given by:  Arna Medici Cary Wilford

## 2014-06-22 NOTE — Discharge Summary (Signed)
Physician Discharge Summary  Glenda Gonzalez GNF:[id number] DOB: Aug 11, 1933 DOA: 06/14/2014  PCP: Billee Cashing, MD  Admit date: 06/14/2014 Discharge date: 06/22/2014  Time spent: 45 minutes     Discharge Condition: poor prognosis Diet recommendation: currently too lethargic to eat- otherwise can have a regular diet as tolerated.   Discharge Diagnoses:  Principal Problem:   Acute respiratory failure with hypoxia Active Problems:   Essential hypertension   CHF (congestive heart failure)   CKD (chronic kidney disease), stage III   Diastolic congestive heart failure   COPD exacerbation   Hypoglycemia   Elevated troponin   DM (diabetes mellitus) type II controlled with renal manifestation   Acute renal failure   Palliative care encounter   Acute delirium   History of present illness:  Glenda Gonzalez is a 81 y.o. female   has a past medical history of GERD (gastroesophageal reflux disease); Hypertension; Diverticulitis; Gout; Essential and other specified forms of tremor; Dementia; CAD (coronary artery disease); Anemia, chronic disease; Left arm pain; CHF (congestive heart failure); Pneumonia; Acute renal failure (ARF); Seizures; Arthritis; Diabetes mellitus; Stroke (2014); and CAD (04/25/2009).   Presented with cold like symptoms for the past 2 months. Increased cough productive of white sputum. NO fever or chest pain. She is not on chronic oxygen. Patient started to have gradually worse wheezing and increase of work of breathing. Patient presented to emergency department required continuous nebulizer treatment. She states that currently her coughing has improved but she continues to have increased work of breathing. Patient was given Solu-Medrol 125 and continuous albuterol neb. He was noted the patient has troponin of 0.1 she denies any chest pain EKG showing left bundle branch block which is unchanged from prior. Patient states that she hasn't eaten all day. Her blood sugar  was checked and was noticed down to 30s. Patient was fed and given 1 amp of D50 after which her blood sugars improved 156. She is not sure how much insulin she is taking at home states her son helps Of note she has history of left-sided weakness secondary to prior CVA.   Hospitalist was called for admission for COPD exacerbation   Hospital Course:  Acute hypoxic respiratory failure- RSV A - COPD exacerbation and possible CHF exacerbation - RSV A positive on resp panel - Last received antibiotics on 06/15/2014 including doxycycline and levofloxacin. Levofloxacin discontinued by critical care service.  - Has been weaned off of steriods - now comfort care- all meds being given via IV as she is not alert enough to take POs  DM (diabetes mellitus) type II controlled with renal manifestation with Hypoglycemia on admission: - CBG's have improved with insulin adjustment. Blood glucose complicated by IV steroids. - A1c 9.0.  AKI on CKD (chronic kidney disease), stage III;  creatinine worsening on diuretics- stop diuretics  Chronic diastolic heart failure now with new systolic congestive heart failure: - echo: LVEF 15-20%, RV, LV failure; + MR, pulmonary HTN  - hold Lasix due to rising Cr  Borderline elevated troponin  -? Demand ischemia; cont ASA, BB; cardiology signed off   Essential hypertension  - Pt now hypotensive-   Acute encephalopathy/altered mental status on top of dementia - per her family she has long memory issues; CT (09/2013): ischemia, atrophy  - Avoid all sedating medications benzo, benadryl if possible. Supportive care; fall precautions   Palliative care consultation poor prognosis- does not seem to be improving- now unresponsive- will be going back home with hospice today  Consultations:  Palliative care  Cardiology  Pulmonology    Discharge Exam: Filed Weights   06/14/14 2321 06/16/14 0555 06/19/14 0650  Weight: 84.4 kg (186 lb 1.1 oz)  82.7 kg (182 lb 5.1 oz) 79.4 kg (175 lb 0.7 oz)   Filed Vitals:   06/22/14 1026  BP: 106/57  Pulse: 106  Temp: 98.8 F (37.1 C)  Resp: 30    General: AAO x 3, no distress Cardiovascular: RRR, no murmurs  Respiratory: clear to auscultation bilaterally GI: soft, non-tender, non-distended, bowel sound positive  Discharge Instructions You were cared for by a hospitalist during your hospital stay. If you have any questions about your discharge medications or the care you received while you were in the hospital after you are discharged, you can call the unit and asked to speak with the hospitalist on call if the hospitalist that took care of you is not available. Once you are discharged, your primary care physician will handle any further medical issues. Please note that NO REFILLS for any discharge medications will be authorized once you are discharged, as it is imperative that you return to your primary care physician (or establish a relationship with a primary care physician if you do not have one) for your aftercare needs so that they can reassess your need for medications and monitor your lab values.      Discharge Instructions    Diet - low sodium heart healthy    Complete by:  As directed             Medication List    STOP taking these medications        albuterol 108 (90 BASE) MCG/ACT inhaler  Commonly known as:  PROVENTIL HFA;VENTOLIN HFA     ciprofloxacin 250 MG tablet  Commonly known as:  CIPRO     dextromethorphan-guaiFENesin 30-600 MG per 12 hr tablet  Commonly known as:  MUCINEX DM     diphenhydrAMINE 25 mg capsule  Commonly known as:  BENADRYL     donepezil 10 MG tablet  Commonly known as:  ARICEPT     esomeprazole 40 MG capsule  Commonly known as:  NEXIUM     hydrALAZINE 25 MG tablet  Commonly known as:  APRESOLINE     insulin aspart 100 UNIT/ML FlexPen  Commonly known as:  NOVOLOG FLEXPEN     Insulin Glargine 100 UNIT/ML Solostar Pen  Commonly  known as:  LANTUS     insulin NPH Human 100 UNIT/ML injection  Commonly known as:  HUMULIN N,NOVOLIN N     Insulin Pen Needle 29G X 12.7MM Misc     isosorbide mononitrate 60 MG 24 hr tablet  Commonly known as:  IMDUR     metoprolol tartrate 25 MG tablet  Commonly known as:  LOPRESSOR     nitroGLYCERIN 0.4 MG SL tablet  Commonly known as:  NITROSTAT     prednisoLONE acetate 1 % ophthalmic suspension  Commonly known as:  PRED FORTE     pregabalin 50 MG capsule  Commonly known as:  LYRICA      TAKE these medications        bimatoprost 0.01 % Soln  Commonly known as:  LUMIGAN  Place 1 drop into both eyes 2 (two) times daily.     ketorolac 0.4 % Soln  Commonly known as:  ACULAR  Place 1 drop into both eyes 4 (four) times daily.     LORazepam 2 MG/ML concentrated solution  Commonly known as:  ATIVAN  Take 0.5 mLs (1 mg total) by mouth every 4 (four) hours as needed for anxiety, seizure, sedation or sleep.     morphine CONCENTRATE 10 MG/0.5ML Soln concentrated solution  Take 1 mL (20 mg total) by mouth every 2 (two) hours as needed for moderate pain, severe pain, anxiety or shortness of breath.     SYSTANE ULTRA OP  Place 1 drop into both eyes daily as needed. For dry eyes       Allergies  Allergen Reactions  . Ace Inhibitors Shortness Of Breath and Other (See Comments)    Angioedema   . Codeine Other (See Comments)    hallucination  . Fentanyl Other (See Comments)    Abnormal behavior per husband  . Other Other (See Comments)    Beef and turkey-causes gout flareup.  Patient stated "[place] makes me real sick".  . Morphine Itching  . Penicillins Itching      The results of significant diagnostics from this hospitalization (including imaging, microbiology, ancillary and laboratory) are listed below for reference.    Significant Diagnostic Studies: Dg Chest 1 View  06/15/2014   CLINICAL DATA:  Respiratory distress  EXAM: CHEST - 1 VIEW  COMPARISON:  06/14/2014   FINDINGS: Cardiac silhouette mildly enlarged.  No mediastinal or hilar masses.  Patchy areas of bilateral interstitial and airspace opacity are noted predominantly centrally. This is mildly increased from the previous exam. Findings may be due to pulmonary edema. Multifocal infection could have this appearance.  Lungs are mildly hyperexpanded. No pleural effusion or pneumothorax.  IMPRESSION: Centrally predominant patchy areas of interstitial and airspace opacity, mildly increased from the previous day's study. Findings may be due to acute bronchial inflammation/infection or pulmonary edema.   Electronically Signed   By: Amie Portland M.D.   On: 06/15/2014 18:40   Dg Chest 2 View  06/14/2014   CLINICAL DATA:  Productive cough for 2 months.  EXAM: CHEST  2 VIEW  COMPARISON:  None.  FINDINGS: The heart is mildly enlarged. There is tortuosity and calcification of the thoracic aorta. There are significant bronchitic changes with peribronchial thickening, increased interstitial markings and streaky areas of atelectasis. Findings could be due to severe bronchitis or atypical interstitial pneumonitis but no focal airspace consolidation or pleural effusion. The bony thorax is intact.  IMPRESSION: Significant bronchitic changes without definite infiltrate.   Electronically Signed   By: Loralie Champagne M.D.   On: 06/14/2014 15:39   US Renal Port  06/18/2014   CLINICAL DATA:  Acute renal failure  EXAM: RENAL/URINARY TRACT ULTRASOUND COMPLETE  COMPARISON:  07/09/2012  FINDINGS: Right Kidney:  Length: 10.0. Increased parenchymal echogenicity. No mass or hydronephrosis visualized.  Left Kidney:  Length: 8 cm. Increased parenchymal echogenicity. No mass or hydronephrosis visualized.  Bladder:  Collapsed around a Foley catheter balloon.  IMPRESSION: 1. Bilateral echogenic kidneys without evidence for hydronephrosis.   Electronically Signed   By: Signa Kell M.D.   On: 06/18/2014 11:43   Dg Chest Port 1 View  06/18/2014    CLINICAL DATA:  Acute respiratory failure  EXAM: PORTABLE CHEST - 1 VIEW  COMPARISON:  06/17/2014  FINDINGS: Cardiomegaly and aortic tortuosity is unchanged. Diffuse interstitial opacity is also stable, pulmonary edema versus is bronchitic change. No effusion or pneumothorax.  IMPRESSION: Unchanged congestive or bronchitic interstitial coarsening.   Electronically Signed   By: Tiburcio Pea M.D.   On: 06/18/2014 05:57   Dg Chest Port 1 View  06/17/2014   CLINICAL DATA:  Respiratory  failure, shortness of breath and cough.  EXAM: PORTABLE CHEST - 1 VIEW  COMPARISON:  06/15/2014  FINDINGS: Patchy edema pattern shows some improvement since the prior chest x-ray. Diffuse bronchovascular prominence remains bilaterally. No focal airspace consolidation. The heart size is stable and at the upper limits of normal. No significant pleural fluid identified.  IMPRESSION: Improvement in edema pattern since the prior study.   Electronically Signed   By: Irish Lack M.D.   On: 06/17/2014 10:30    Microbiology: Recent Results (from the past 240 hour(s))  MRSA PCR Screening     Status: None   Collection Time: 06/14/14 11:27 PM  Result Value Ref Range Status   MRSA by PCR NEGATIVE NEGATIVE Final    Comment:        The GeneXpert MRSA Assay (FDA approved for NASAL specimens only), is one component of a comprehensive MRSA colonization surveillance program. It is not intended to diagnose MRSA infection nor to guide or monitor treatment for MRSA infections.   Culture, Urine     Status: None   Collection Time: 06/15/14  5:47 AM  Result Value Ref Range Status   Specimen Description URINE, CLEAN CATCH  Final   Special Requests Normal  Final   Colony Count   Final    50,000 COLONIES/ML Performed at [facility]    Culture   Final    Multiple bacterial morphotypes present, none predominant. Suggest appropriate recollection if clinically indicated. Performed at [facility]    Report  Status 06/16/2014 FINAL  Final  Respiratory virus panel (routine influenza)     Status: Abnormal   Collection Time: 06/16/14 11:22 AM  Result Value Ref Range Status   Respiratory Syncytial Virus A Positive (A) Negative Final   Respiratory Syncytial Virus B Negative Negative Final   Influenza A Negative Negative Final   Influenza B Negative Negative Final   Parainfluenza 1 Negative Negative Final   Parainfluenza 2 Negative Negative Final   Parainfluenza 3 Negative Negative Final   Metapneumovirus Negative Negative Final   Rhinovirus Negative Negative Final   Adenovirus Negative Negative Final    Comment: (NOTE) Performed At: [facility] [address] [place], [place] [id number] Mila Homer MD WU:[id number]      Labs: Basic Metabolic Panel:  Recent Labs Lab 06/15/14 2000 06/16/14 0445 06/17/14 0335 06/19/14 0410 06/20/14 0400 06/21/14 0358  NA 132* 136 132* 141 144  --   K 5.1 5.6* 4.5 3.8 3.4*  --   CL 101 107 102 108 109  --   CO2 18* 21 21 18* 21  --   GLUCOSE 324* 150* 183* 147* 248*  --   BUN 43* 50* 69* 62* 64*  --   CREATININE 1.78* 1.88* 2.17* 1.72* 1.90* 2.52*  CALCIUM 8.6 8.8 8.2* 9.0 9.3  --    Liver Function Tests: No results for input(s): AST, ALT, ALKPHOS, BILITOT, PROT, ALBUMIN in the last 168 hours. No results for input(s): LIPASE, AMYLASE in the last 168 hours. No results for input(s): AMMONIA in the last 168 hours. CBC:  Recent Labs Lab 06/15/14 2000 06/16/14 0445 06/19/14 0410  WBC 9.2 9.9 6.6  NEUTROABS 7.6  --   --   HGB 10.6* 10.6* 9.8*  HCT 33.4* 34.1* 31.8*  MCV 82.1 82.4 82.2  PLT 221 PLATELET CLUMPS NOTED ON SMEAR, COUNT APPEARS ADEQUATE 236   Cardiac Enzymes: No results for input(s): CKTOTAL, CKMB, CKMBINDEX, TROPONINI in the last 168 hours. BNP: BNP (last  3 results)  Recent Labs  10/14/13 1653  PROBNP 409.0   CBG:  Recent Labs Lab 06/19/14 1717 06/19/14 2329 06/20/14 0904 06/20/14 1157 06/20/14 1616   GLUCAP 258* 179* 257* 248* 165*       SignedCalvert Cantor, MD [facility] 06/22/2014, 1:23 PM

## 2014-06-22 NOTE — Progress Notes (Signed)
Symptom management recommendations made and orders placed in EMR- regimen is geared toward plan for hospice and comfort care at home.  1. Roxanol SL 20 q2 prn pain and dyspnea 2. Ativan Intensol 1mg  q4 prn agitation 3. Haldol Solution 1mg  q4 prn agitation   Anderson Malta, DO Palliative Medicine [phone]

## 2014-06-22 NOTE — Progress Notes (Addendum)
Follow-up: spoke with pt's son Renae Fickle at the home [phone]) he informed [facility] will be delivering the needed DME to the home this morning and he will call the hospital staff and make them aware when the DME has been set up. Spoke with pt's husband Ethelene Browns who is at work and stated he gets off work 12:30 and plans to come to the hospital after that; staff RN Dedra Skeens aware pt's husband will be available around 1 pm to go over d/c paperwork. Spoke with Dr Phillips Odor who indicated discharge medication recommendations in chart; spoke with family who is aware of Liquid concentrated medications for comfort - discussed listed Morphine  Allergy- husband and son stated that the allergy was itching and she would sometimes 'talk crazy' - the family stated they did not have and issue if this medication was needed to keep her comfortable.  *Of note -pt has not received Morphine, or other opioid, last dose of Ativan was 1/26, last dose of Haldol 1/25  This was discussed with Dr Phillips Odor who feels at this time that this (allergy possibility) would not be an issue. Per PMT recommendations prescriptions for discharge meds will be needed: 1) Liquid concentrated Morphine 10 mg/0.5 ml  -*Please Note DISPENSE QUANTITY = 30 ml   2) Liquid concentrated Lorazepam 2mg / ml 3 Liquid concentrated Haldol 2mg  /ml  Writer did contact [facility] [address] and informed the family that this pharmacy does have the Morphine and Lorazepam and will have the Haldol in by tomorrow  Mr Cranor shared with this Clinical research associate that he knows his wife's time is very short and feels he's "already lost her" he was tearful, emotional support given; reinforced [facility] team support and 24/7 on call staff for urgent needs/ changes at home - he voiced understanding and appreciation. Please leave Foley catheter in place at discharge for comfort per family request.  [facility] is aware of planned discharge today. Please contact the Referral Center when transport on the unit  and pt ready to leave -call [phone]. *Completed Discharge Summary will need to be faxed to [facility] at [phone] Please call with any questions. Thank you Valente David RN MSN [facility] Liaison [phone]

## 2014-06-22 NOTE — Progress Notes (Signed)
Pt for discharge home with hospice.  CSW received notification from RN that pt needing ambulance transport home.  CSW spoke with pt son, Renae Fickle and confirmed address.   CSW arranged ambulance transport via [facility] for pt to home.   RN aware.  No further social work needs identified at this time.  CSW signing off.   Loletta Specter, MSW, LCSW Clinical Social Work [phone]

## 2014-06-25 DIAGNOSIS — R0602 Shortness of breath: Secondary | ICD-10-CM | POA: Insufficient documentation

## 2014-06-25 DIAGNOSIS — R0603 Acute respiratory distress: Secondary | ICD-10-CM | POA: Insufficient documentation

## 2014-06-26 DEATH — deceased
